# Patient Record
Sex: Female | Born: 1975
Health system: Southern US, Community
[De-identification: ages and names within clinical notes are randomized; demographics above are authoritative.]

## PROBLEM LIST (undated history)

## (undated) DIAGNOSIS — Z9889 Other specified postprocedural states: Secondary | ICD-10-CM

## (undated) DIAGNOSIS — N186 End stage renal disease: Secondary | ICD-10-CM

## (undated) DIAGNOSIS — T4145XA Adverse effect of unspecified anesthetic, initial encounter: Secondary | ICD-10-CM

## (undated) DIAGNOSIS — R112 Nausea with vomiting, unspecified: Secondary | ICD-10-CM

## (undated) DIAGNOSIS — G459 Transient cerebral ischemic attack, unspecified: Secondary | ICD-10-CM

## (undated) DIAGNOSIS — I10 Essential (primary) hypertension: Secondary | ICD-10-CM

## (undated) DIAGNOSIS — T8859XA Other complications of anesthesia, initial encounter: Secondary | ICD-10-CM

## (undated) DIAGNOSIS — Z21 Asymptomatic human immunodeficiency virus [HIV] infection status: Secondary | ICD-10-CM

## (undated) DIAGNOSIS — D649 Anemia, unspecified: Secondary | ICD-10-CM

## (undated) DIAGNOSIS — N189 Chronic kidney disease, unspecified: Secondary | ICD-10-CM

## (undated) DIAGNOSIS — I639 Cerebral infarction, unspecified: Secondary | ICD-10-CM

## (undated) DIAGNOSIS — B20 Human immunodeficiency virus [HIV] disease: Secondary | ICD-10-CM

## (undated) DIAGNOSIS — Z992 Dependence on renal dialysis: Secondary | ICD-10-CM

## (undated) HISTORY — DX: Essential (primary) hypertension: I10

## (undated) HISTORY — DX: Chronic kidney disease, unspecified: N18.9

## (undated) HISTORY — DX: Anemia, unspecified: D64.9

## (undated) HISTORY — DX: End stage renal disease: N18.6

## (undated) HISTORY — DX: Human immunodeficiency virus (HIV) disease: B20

## (undated) HISTORY — DX: Asymptomatic human immunodeficiency virus (hiv) infection status: Z21

## (undated) HISTORY — DX: Cerebral infarction, unspecified: I63.9

---

## 2004-11-17 ENCOUNTER — Ambulatory Visit (HOSPITAL_COMMUNITY): Admission: RE | Admit: 2004-11-17 | Discharge: 2004-11-17 | Payer: Self-pay | Admitting: Family Medicine

## 2005-08-08 ENCOUNTER — Emergency Department (HOSPITAL_COMMUNITY): Admission: EM | Admit: 2005-08-08 | Discharge: 2005-08-08 | Payer: Self-pay | Admitting: Emergency Medicine

## 2005-08-16 ENCOUNTER — Ambulatory Visit: Payer: Self-pay | Admitting: Orthopedic Surgery

## 2005-08-22 ENCOUNTER — Encounter: Admission: RE | Admit: 2005-08-22 | Discharge: 2005-10-31 | Payer: Self-pay | Admitting: Orthopedic Surgery

## 2008-07-28 ENCOUNTER — Ambulatory Visit (HOSPITAL_COMMUNITY): Admission: RE | Admit: 2008-07-28 | Discharge: 2008-07-28 | Payer: Self-pay | Admitting: Family Medicine

## 2011-08-20 ENCOUNTER — Other Ambulatory Visit: Payer: Self-pay | Admitting: Family Medicine

## 2011-08-22 ENCOUNTER — Ambulatory Visit (HOSPITAL_COMMUNITY)
Admission: RE | Admit: 2011-08-22 | Discharge: 2011-08-22 | Disposition: A | Discharge status: Home / Self Care | Payer: BC Managed Care – PPO | Source: Ambulatory Visit | Attending: Family Medicine | Admitting: Family Medicine

## 2011-08-22 DIAGNOSIS — R110 Nausea: Secondary | ICD-10-CM | POA: Insufficient documentation

## 2011-08-22 DIAGNOSIS — R9389 Abnormal findings on diagnostic imaging of other specified body structures: Secondary | ICD-10-CM | POA: Insufficient documentation

## 2011-08-22 DIAGNOSIS — R109 Unspecified abdominal pain: Secondary | ICD-10-CM | POA: Insufficient documentation

## 2011-09-03 ENCOUNTER — Other Ambulatory Visit: Payer: Self-pay | Admitting: Nephrology

## 2011-09-03 ENCOUNTER — Encounter (HOSPITAL_COMMUNITY): Payer: BC Managed Care – PPO | Attending: Nephrology

## 2011-09-03 DIAGNOSIS — N185 Chronic kidney disease, stage 5: Secondary | ICD-10-CM | POA: Insufficient documentation

## 2011-09-03 DIAGNOSIS — D638 Anemia in other chronic diseases classified elsewhere: Secondary | ICD-10-CM | POA: Insufficient documentation

## 2011-09-03 LAB — POCT HEMOGLOBIN-HEMACUE: Hemoglobin: 6.6 g/dL — CL (ref 12.0–15.0)

## 2011-09-04 ENCOUNTER — Other Ambulatory Visit: Payer: Self-pay

## 2011-09-04 DIAGNOSIS — N19 Unspecified kidney failure: Secondary | ICD-10-CM

## 2011-09-04 DIAGNOSIS — Z01810 Encounter for preprocedural cardiovascular examination: Secondary | ICD-10-CM

## 2011-09-05 ENCOUNTER — Other Ambulatory Visit (INDEPENDENT_AMBULATORY_CARE_PROVIDER_SITE_OTHER): Payer: BC Managed Care – PPO | Admitting: *Deleted

## 2011-09-05 ENCOUNTER — Encounter: Payer: Self-pay | Admitting: Vascular Surgery

## 2011-09-05 DIAGNOSIS — Z01810 Encounter for preprocedural cardiovascular examination: Secondary | ICD-10-CM

## 2011-09-05 DIAGNOSIS — N186 End stage renal disease: Secondary | ICD-10-CM

## 2011-09-06 ENCOUNTER — Encounter: Payer: Self-pay | Admitting: Vascular Surgery

## 2011-09-06 ENCOUNTER — Ambulatory Visit (INDEPENDENT_AMBULATORY_CARE_PROVIDER_SITE_OTHER): Payer: BC Managed Care – PPO | Admitting: Vascular Surgery

## 2011-09-06 VITALS — BP 148/98 | HR 72 | Temp 98.1°F | Ht 60.0 in | Wt 160.0 lb

## 2011-09-06 DIAGNOSIS — N186 End stage renal disease: Secondary | ICD-10-CM | POA: Insufficient documentation

## 2011-09-06 DIAGNOSIS — Z21 Asymptomatic human immunodeficiency virus [HIV] infection status: Secondary | ICD-10-CM

## 2011-09-06 DIAGNOSIS — I10 Essential (primary) hypertension: Secondary | ICD-10-CM | POA: Insufficient documentation

## 2011-09-06 DIAGNOSIS — B20 Human immunodeficiency virus [HIV] disease: Secondary | ICD-10-CM | POA: Insufficient documentation

## 2011-09-06 NOTE — Progress Notes (Signed)
[facility] [place] HISTORY AND PHYSICAL   History of Present Illness:  Patient is a 35 y.o. year old female who presents for placement of a permanent hemodialysis access. The patient is right handed .  The patient is not currently on hemodialysis.  The cause of renal failure is thought to be secondary to hypertension. She has no other significant medical problems.  Past Medical History  Diagnosis Date  . Hypertension   . HIV infection   . Chronic kidney disease   . Anemia     Past Surgical History  Procedure Date  . Cesarean section      Social History History  Substance Use Topics  . Smoking status: Former Smoker -- 3 years    Types: Cigarettes    Quit date: 09/05/2009  . Smokeless tobacco: Not on file  . Alcohol Use: No    Family History History reviewed. No pertinent family history.  Allergies  Allergies  Allergen Reactions  . Sulfa Antibiotics Hives     Current Outpatient Prescriptions  Medication Sig Dispense Refill  . calcium carbonate (TUMS EX) 750 MG chewable tablet Chew 1 tablet by mouth 3 (three) times daily.        Marland Kitchen epoetin alfa (EPOGEN,PROCRIT) 16109 UNIT/ML injection Inject 20,000 Units into the skin. Every two weeks       . ferrous gluconate (FERGON) 240 (27 FE) MG tablet Take 240 mg by mouth daily.        Marland Kitchen lisinopril (PRINIVIL,ZESTRIL) 5 MG tablet Take 5 mg by mouth daily.        Marland Kitchen METOPROLOL TARTRATE PO Take 50 mg by mouth 2 (two) times daily.       . Multiple Vitamin (MULTIVITAMIN) tablet Take 1 tablet by mouth daily.        Marland Kitchen triamterene-hydrochlorothiazide (DYAZIDE) 37.5-25 MG per capsule Take 1 capsule by mouth every morning.          ROS:   General:  No weight loss, Fever, chills  HEENT: No recent headaches, no nasal bleeding, no visual changes, no sore throat  Neurologic: No dizziness, blackouts, seizures. No recent symptoms of stroke or mini- stroke. No recent episodes of slurred speech, or temporary  blindness.  Cardiac: No recent episodes of chest pain/pressure, no shortness of breath at rest.  No shortness of breath with exertion.  Denies history of atrial fibrillation or irregular heartbeat  Vascular: No history of rest pain in feet.  No history of claudication.  No history of non-healing ulcer, No history of DVT   Pulmonary: No home oxygen, no productive cough, no hemoptysis,  No asthma or wheezing  Musculoskeletal:  [ ]  Arthritis, [ ]  Low back pain,  [ ]  Joint pain  Hematologic:No history of hypercoagulable state.  No history of easy bleeding.  No history of anemia  Gastrointestinal: No hematochezia or melena,  No gastroesophageal reflux, no trouble swallowing  Urinary: [ ]  chronic Kidney disease, [ ]  on HD - [ ]  MWF or [ ]  TTHS, [ ]  Burning with urination, [ ]  Frequent urination, [ ]  Difficulty urinating;   Skin: No rashes  Psychological: No history of anxiety,  No history of depression   Physical Examination  Filed Vitals:   09/06/11 1202  BP: 148/98  Pulse: 72  Temp: 98.1 F (36.7 C)  TempSrc: Oral  Height: 5' (1.524 m)  Weight: 160 lb (72.576 kg)  SpO2: 100%    Body mass index is 31.25 kg/(m^2).  General:  Alert and  oriented, no acute distress HEENT: Normal Neck: No bruit or JVD Pulmonary: Clear to auscultation bilaterally Cardiac: Regular Rate and Rhythm without murmur Gastrointestinal: Soft, non-tender, non-distended, no mass, no scars Skin: No rash Extremity Pulses:  2+ radial, brachial pulses bilaterally, radial artery feels small Musculoskeletal: No deformity or edema  Neurologic: Upper and lower extremity motor 5/5 and symmetric  DATA: She had a vein mapping ultrasound today which showed the cephalic and basilic veins were quite small bilaterally. These would not be acceptable for fistula.   ASSESSMENT: Patient needs long-term hemodialysis access.   PLAN: She will be scheduled for a left arm AV graft on 09/11/2011. Risks benefits possible  complications and procedure details were explained the patient today including but limited to graft thrombosis rate of 25% per year, risk of ischemic steal, infection, bleeding. She understands and agrees to proceed.  Ruta Hinds, MD [facility] [place] Office: [phone] Pager: [phone]

## 2011-09-10 ENCOUNTER — Encounter (HOSPITAL_COMMUNITY): Payer: BC Managed Care – PPO

## 2011-09-11 ENCOUNTER — Ambulatory Visit (HOSPITAL_COMMUNITY)
Admission: RE | Admit: 2011-09-11 | Discharge: 2011-09-11 | Disposition: A | Discharge status: Home / Self Care | Payer: BC Managed Care – PPO | Source: Ambulatory Visit | Attending: Vascular Surgery | Admitting: Vascular Surgery

## 2011-09-11 ENCOUNTER — Ambulatory Visit (HOSPITAL_COMMUNITY): Payer: BC Managed Care – PPO

## 2011-09-11 DIAGNOSIS — N186 End stage renal disease: Secondary | ICD-10-CM

## 2011-09-11 DIAGNOSIS — Z21 Asymptomatic human immunodeficiency virus [HIV] infection status: Secondary | ICD-10-CM | POA: Insufficient documentation

## 2011-09-11 DIAGNOSIS — I120 Hypertensive chronic kidney disease with stage 5 chronic kidney disease or end stage renal disease: Secondary | ICD-10-CM | POA: Insufficient documentation

## 2011-09-11 HISTORY — PX: ARTERIOVENOUS GRAFT PLACEMENT: SUR1029

## 2011-09-11 LAB — POCT I-STAT 4, (NA,K, GLUC, HGB,HCT)
Hemoglobin: 9.2 g/dL — ABNORMAL LOW (ref 12.0–15.0)
Potassium: 3.3 mEq/L — ABNORMAL LOW (ref 3.5–5.1)

## 2011-09-11 LAB — SURGICAL PCR SCREEN: MRSA, PCR: NEGATIVE

## 2011-09-12 NOTE — Procedures (Unsigned)
CEPHALIC VEIN MAPPING  INDICATION:  Preoperative vein mapping for AVF placement.  HISTORY:  EXAM: The right cephalic vein is compressible.  Diameter measurements range from 0.11 to 0.07 cm.  The right basilic vein is compressible.  Diameter measurements range from 0.22 to 0.21 cm.  The left cephalic vein is too small to be visualized and measured adequately.  The left basilic vein is compressible.  Diameter measurements range from 0.18 to 0.13 cm.  See attached worksheet for all measurements.  IMPRESSION:  Patent right cephalic and basilic veins and left basilic vein with diameter measurements shown on the following worksheet.  ___________________________________________ Jessy Oto. Oneida Alar, MD  EM/MEDQ  D:  09/06/2011  T:  09/06/2011  Job:  [id number]

## 2011-09-14 ENCOUNTER — Encounter (HOSPITAL_COMMUNITY): Payer: BC Managed Care – PPO

## 2011-09-20 NOTE — Op Note (Signed)
  Tiffany Golden, Tiffany Golden NO.:  [id number]  MEDICAL RECORD NO.:  [id number]  LOCATION:  [facility]                         FACILITY:  [facility]  PHYSICIAN:  Jessy Oto. Daron Stutz, MD  DATE OF BIRTH:  [birth year]  DATE OF PROCEDURE:  09/11/2011 DATE OF DISCHARGE:                              OPERATIVE REPORT   PROCEDURE:  Left brachiocephalic arteriovenous fistula.  PREOPERATIVE DIAGNOSIS:  End-stage renal disease.  POSTOPERATIVE DIAGNOSIS:  End-stage renal disease.  ANESTHESIA:  Local with IV sedation.  ASSISTANT:  Leta Baptist, PA  OPERATIVE FINDINGS:  A 3-mm left cephalic vein.  OPERATIVE DETAILS:  After obtaining informed consent, the patient was taken to the operating room.  The patient was placed in supine position on the operating table.  After adequate sedation, the patient's entire left upper extremity was prepped and draped in usual sterile fashion. Next, a transverse incision was made in the left antecubital crease. Incision was carried down through the subcutaneous tissues down the level of the left cephalic vein.  Preoperative vein mapping had suggested that the vein was quite small, however, I was pleasantly surprised to see that the vein was at least 3 mm in diameter.  This was dissected free circumferentially and small side branches were ligated and divided between silk ties.  Next, the brachial artery was dissected free in the medial portion of the incision.  The brachial artery was approximately 3 mm in diameter.  The patient was given 5000 units of intravenous heparin.  The distal cephalic vein was ligated with a 2-0 silk tie.  The vein was transected and swung over the level of the artery.  It was gently distended with heparinized saline and marked for orientation.  It was clamped proximally with a fine bulldog clamp. Vessel loops were used to control the brachial artery proximally and distally.  A longitudinal opening was made in the brachial  artery, and the vein was sewn end-of-vein to side-of-artery using a running 6-0 Prolene suture.  Just prior to completion, anastomosis was fore bled, back bled, and thoroughly flushed.  Anastomosis was secured.  Vessel loops were released.  There was a palpable thrill in the fistula immediately.  Hemostasis was obtained.  The subcutaneous tissues were reapproximated using running 3-0 Vicryl suture.  Skin was closed with a 4-0 Vicryl subcuticular stitch and Dermabond applied.  The patient tolerated the procedure well, and there were no complications.  Instrument, sponge and needle counts were correct at the end of the case.  The patient was taken to the recovery room in stable condition.     Jessy Oto. [name], MD     CEF/MEDQ  D:  09/11/2011  T:  09/11/2011  Job:  [id number]  Electronically Signed by Ruta Hinds MD on 09/20/2011 01:31:04 PM

## 2011-10-10 ENCOUNTER — Ambulatory Visit: Payer: BC Managed Care – PPO

## 2011-10-11 ENCOUNTER — Encounter: Payer: Self-pay | Admitting: Physician Assistant

## 2011-10-12 ENCOUNTER — Ambulatory Visit (INDEPENDENT_AMBULATORY_CARE_PROVIDER_SITE_OTHER): Payer: BC Managed Care – PPO | Admitting: Physician Assistant

## 2011-10-12 ENCOUNTER — Encounter: Payer: Self-pay | Admitting: Physician Assistant

## 2011-10-12 ENCOUNTER — Other Ambulatory Visit (HOSPITAL_COMMUNITY): Payer: Self-pay | Admitting: *Deleted

## 2011-10-12 VITALS — BP 148/105 | HR 89 | Resp 20 | Ht 61.0 in | Wt 139.8 lb

## 2011-10-12 DIAGNOSIS — N186 End stage renal disease: Secondary | ICD-10-CM

## 2011-10-12 NOTE — Progress Notes (Signed)
[facility] [place] Postoperative Visit hemodialysis access   Date of Surgery: 09/11/11 Surgeon: Oneida Alar HD:  no HD:  n/a  CC: f/u for left brachiocephalic AVF placement  HPI:  This is a 35 y.o. female who returns today s/p left brachiocephalic AVF placement.  She denies any symptoms of steal.  She has no complaints.  PHYSICAL EXAMINATION:  Filed Vitals:   10/12/11 1434  BP: 148/105  Pulse: 89  Resp: 20     Incision is c/d/i Hand grip is equal bilaterally and sensation in digits is intact; There is  Thrill; there is bruit. The graft/fistula is easily palpable  Pulse:  +palpable left radial pulse.  ASSESSMENT/PLAN:  Tiffany Golden is a 35 y.o. year old female who presents s/p left brachiocephalic AVF placement on 09/11/11.    She is doing well and is 4 weeks post op.  She has a good thrill. There is no evidence of steal.    I have asked her to return in 6 weeks to check on the maturation of the AVF. Marland Kitchen  Evorn Gong, PA-C [facility] [phone]  Clinic MD:   Bridgett Larsson

## 2011-10-16 ENCOUNTER — Encounter (HOSPITAL_COMMUNITY)
Admission: RE | Admit: 2011-10-16 | Discharge: 2011-10-16 | Disposition: A | Discharge status: Home / Self Care | Payer: BC Managed Care – PPO | Source: Ambulatory Visit | Attending: Nephrology | Admitting: Nephrology

## 2011-10-16 DIAGNOSIS — D638 Anemia in other chronic diseases classified elsewhere: Secondary | ICD-10-CM | POA: Insufficient documentation

## 2011-10-16 DIAGNOSIS — N185 Chronic kidney disease, stage 5: Secondary | ICD-10-CM | POA: Insufficient documentation

## 2011-10-16 LAB — RENAL FUNCTION PANEL
Albumin: 1.2 g/dL — ABNORMAL LOW (ref 3.5–5.2)
BUN: 104 mg/dL — ABNORMAL HIGH (ref 6–23)
CO2: 13 mEq/L — ABNORMAL LOW (ref 19–32)
Creatinine, Ser: 16.46 mg/dL — ABNORMAL HIGH (ref 0.50–1.10)
Glucose, Bld: 106 mg/dL — ABNORMAL HIGH (ref 70–99)
Phosphorus: 9.8 mg/dL — ABNORMAL HIGH (ref 2.3–4.6)
Potassium: 3.3 mEq/L — ABNORMAL LOW (ref 3.5–5.1)
Sodium: 136 mEq/L (ref 135–145)

## 2011-10-16 LAB — FERRITIN: Ferritin: 334 ng/mL — ABNORMAL HIGH (ref 10–291)

## 2011-10-16 LAB — IRON AND TIBC
TIBC: 75 ug/dL — ABNORMAL LOW (ref 250–470)
UIBC: 23 ug/dL — ABNORMAL LOW (ref 125–400)

## 2011-10-16 MED ORDER — EPOETIN ALFA 10000 UNIT/ML IJ SOLN: 20000.0000 [IU] | INTRAMUSCULAR | Status: DC

## 2011-10-16 MED ORDER — EPOETIN ALFA 20000 UNIT/ML IJ SOLN
INTRAMUSCULAR | Status: AC
  Administered 2011-10-16: 20000 [IU] via SUBCUTANEOUS
  Filled 2011-10-16: qty 1

## 2011-10-17 LAB — PTH, INTACT AND CALCIUM: PTH: 432.4 pg/mL — ABNORMAL HIGH (ref 14.0–72.0)

## 2011-10-23 ENCOUNTER — Encounter (HOSPITAL_COMMUNITY): Payer: BC Managed Care – PPO

## 2011-10-30 ENCOUNTER — Encounter (HOSPITAL_COMMUNITY)
Admission: RE | Admit: 2011-10-30 | Discharge: 2011-10-30 | Disposition: A | Discharge status: Home / Self Care | Payer: BC Managed Care – PPO | Source: Ambulatory Visit | Attending: Nephrology | Admitting: Nephrology

## 2011-10-30 LAB — POCT HEMOGLOBIN-HEMACUE: Hemoglobin: 10.5 g/dL — ABNORMAL LOW (ref 12.0–15.0)

## 2011-10-30 MED ORDER — EPOETIN ALFA 10000 UNIT/ML IJ SOLN: 20000.0000 [IU] | INTRAMUSCULAR | Status: DC

## 2011-10-30 MED ORDER — EPOETIN ALFA 20000 UNIT/ML IJ SOLN
INTRAMUSCULAR | Status: AC
  Administered 2011-10-30: 20000 [IU]
  Filled 2011-10-30: qty 1

## 2011-11-14 ENCOUNTER — Inpatient Hospital Stay (HOSPITAL_COMMUNITY): Admission: RE | Admit: 2011-11-14 | Payer: BC Managed Care – PPO | Source: Ambulatory Visit

## 2011-11-21 ENCOUNTER — Encounter (HOSPITAL_COMMUNITY): Payer: BC Managed Care – PPO

## 2011-11-22 ENCOUNTER — Ambulatory Visit: Payer: BC Managed Care – PPO

## 2011-11-28 ENCOUNTER — Encounter (HOSPITAL_COMMUNITY): Payer: BC Managed Care – PPO

## 2011-12-05 ENCOUNTER — Encounter (HOSPITAL_COMMUNITY): Payer: BC Managed Care – PPO

## 2011-12-12 ENCOUNTER — Encounter (HOSPITAL_COMMUNITY): Payer: BC Managed Care – PPO

## 2012-01-22 ENCOUNTER — Telehealth: Payer: Self-pay

## 2012-01-22 NOTE — Telephone Encounter (Signed)
Dr Deterdin's office was contacted and informed multiple phone calls made to schedule appointment for patient.  She is not able to come for intake due to her dialysis schedule.  [facility] since [facility] on Tuesday, Thursday and Saturday. But reconsidered due to the specialty labs that will need to be frozen.  Patient will need to come to our office on one of her non dialysis days.  We will work with her on dates and times as related to the physician's appointment.   I have asked Caren Griffins to speak with the patient while she is there to schedule the visit.  Laverle Patter, RN

## 2013-03-18 ENCOUNTER — Telehealth: Payer: Self-pay

## 2013-03-18 NOTE — Telephone Encounter (Signed)
Caren Griffins from [place] is calling to refer patient.  This patient has been referred in the past and has not presented for appointment.   Per Caren Griffins, the physician at the center thinks her health is failing and she has never been in treatment.   Lab appointment scheduled along with office visit with Dr Tommy Medal. They will notify patient.   No intake scheduled due to patients dialysis schedule and the need for decreased visits.   We will vaccinate the patient at her office visit.   No records of treatment available.    Laverle Patter, RN  Caren Griffins - [phone]

## 2013-03-26 ENCOUNTER — Other Ambulatory Visit: Payer: Self-pay | Admitting: Infectious Disease

## 2013-03-26 ENCOUNTER — Other Ambulatory Visit: Payer: 59

## 2013-03-26 DIAGNOSIS — B20 Human immunodeficiency virus [HIV] disease: Secondary | ICD-10-CM

## 2013-03-26 LAB — COMPLETE METABOLIC PANEL WITH GFR
AST: 47 U/L — ABNORMAL HIGH (ref 0–37)
Alkaline Phosphatase: 118 U/L — ABNORMAL HIGH (ref 39–117)
BUN: 28 mg/dL — ABNORMAL HIGH (ref 6–23)
Creat: 6.74 mg/dL — ABNORMAL HIGH (ref 0.50–1.10)
Potassium: 4.4 mEq/L (ref 3.5–5.3)

## 2013-03-26 LAB — CBC WITH DIFFERENTIAL/PLATELET
Basophils Absolute: 0.1 10*3/uL (ref 0.0–0.1)
Basophils Relative: 1 % (ref 0–1)
Eosinophils Relative: 10 % — ABNORMAL HIGH (ref 0–5)
HCT: 32 % — ABNORMAL LOW (ref 36.0–46.0)
MCHC: 34.4 g/dL (ref 30.0–36.0)
Monocytes Absolute: 0.5 10*3/uL (ref 0.1–1.0)
Neutro Abs: 3.3 10*3/uL (ref 1.7–7.7)
RDW: 17.6 % — ABNORMAL HIGH (ref 11.5–15.5)

## 2013-03-26 LAB — LIPID PANEL
HDL: 31 mg/dL — ABNORMAL LOW (ref >39)
LDL Cholesterol: 64 mg/dL (ref 0–99)
Total CHOL/HDL Ratio: 4.2 Ratio

## 2013-03-27 LAB — T-HELPER CELL (CD4) - (RCID CLINIC ONLY): CD4 T Cell Abs: 60 uL — ABNORMAL LOW (ref 400–2700)

## 2013-03-27 LAB — HEPATITIS B SURFACE ANTIGEN: Hepatitis B Surface Ag: NEGATIVE

## 2013-03-27 LAB — RPR

## 2013-03-27 LAB — HIV ANTIBODY (ROUTINE TESTING W REFLEX): HIV: REACTIVE

## 2013-03-30 LAB — HIV-1 RNA ULTRAQUANT REFLEX TO GENTYP+
HIV 1 RNA Quant: 61345 copies/mL — ABNORMAL HIGH (ref <20)
HIV-1 RNA Quant, Log: 4.79 {Log} — ABNORMAL HIGH (ref <1.30)

## 2013-03-31 ENCOUNTER — Telehealth: Payer: Self-pay | Admitting: Infectious Disease

## 2013-03-31 NOTE — Telephone Encounter (Signed)
Just was double checking that genotype was being run as it appears to be

## 2013-04-04 ENCOUNTER — Emergency Department (HOSPITAL_COMMUNITY): Payer: 59

## 2013-04-04 ENCOUNTER — Inpatient Hospital Stay (HOSPITAL_COMMUNITY)
Admission: EM | Admit: 2013-04-04 | Discharge: 2013-04-08 | DRG: 977 | Disposition: A | Discharge status: Home / Self Care | Payer: 59 | Attending: Internal Medicine | Admitting: Internal Medicine

## 2013-04-04 ENCOUNTER — Encounter (HOSPITAL_COMMUNITY): Payer: Self-pay | Admitting: *Deleted

## 2013-04-04 DIAGNOSIS — B20 Human immunodeficiency virus [HIV] disease: Principal | ICD-10-CM | POA: Diagnosis present

## 2013-04-04 DIAGNOSIS — I10 Essential (primary) hypertension: Secondary | ICD-10-CM | POA: Diagnosis present

## 2013-04-04 DIAGNOSIS — Z992 Dependence on renal dialysis: Secondary | ICD-10-CM

## 2013-04-04 DIAGNOSIS — Z7982 Long term (current) use of aspirin: Secondary | ICD-10-CM

## 2013-04-04 DIAGNOSIS — R29818 Other symptoms and signs involving the nervous system: Secondary | ICD-10-CM | POA: Diagnosis present

## 2013-04-04 DIAGNOSIS — D649 Anemia, unspecified: Secondary | ICD-10-CM | POA: Diagnosis present

## 2013-04-04 DIAGNOSIS — I69928 Other speech and language deficits following unspecified cerebrovascular disease: Secondary | ICD-10-CM

## 2013-04-04 DIAGNOSIS — Z87891 Personal history of nicotine dependence: Secondary | ICD-10-CM

## 2013-04-04 DIAGNOSIS — R29898 Other symptoms and signs involving the musculoskeletal system: Secondary | ICD-10-CM | POA: Diagnosis present

## 2013-04-04 DIAGNOSIS — A0472 Enterocolitis due to Clostridium difficile, not specified as recurrent: Secondary | ICD-10-CM

## 2013-04-04 DIAGNOSIS — N186 End stage renal disease: Secondary | ICD-10-CM

## 2013-04-04 DIAGNOSIS — G459 Transient cerebral ischemic attack, unspecified: Secondary | ICD-10-CM

## 2013-04-04 DIAGNOSIS — I120 Hypertensive chronic kidney disease with stage 5 chronic kidney disease or end stage renal disease: Secondary | ICD-10-CM | POA: Diagnosis present

## 2013-04-04 DIAGNOSIS — R509 Fever, unspecified: Secondary | ICD-10-CM

## 2013-04-04 HISTORY — DX: Dependence on renal dialysis: Z99.2

## 2013-04-04 LAB — ETHANOL: Alcohol, Ethyl (B): <11 mg/dL (ref 0–11)

## 2013-04-04 LAB — DIFFERENTIAL
Basophils Absolute: 0.1 10*3/uL (ref 0.0–0.1)
Eosinophils Relative: 10 % — ABNORMAL HIGH (ref 0–5)
Lymphs Abs: 1.7 10*3/uL (ref 0.7–4.0)
Monocytes Absolute: 0.7 10*3/uL (ref 0.1–1.0)
WBC Morphology: INCREASED

## 2013-04-04 LAB — COMPREHENSIVE METABOLIC PANEL
AST: 43 U/L — ABNORMAL HIGH (ref 0–37)
CO2: 32 mEq/L (ref 19–32)
Calcium: 9 mg/dL (ref 8.4–10.5)
Creatinine, Ser: 5.96 mg/dL — ABNORMAL HIGH (ref 0.50–1.10)
GFR calc Af Amer: 9 mL/min — ABNORMAL LOW (ref >90)
GFR calc non Af Amer: 8 mL/min — ABNORMAL LOW (ref >90)

## 2013-04-04 LAB — POCT I-STAT, CHEM 8
BUN: 22 mg/dL (ref 6–23)
Chloride: 95 mEq/L — ABNORMAL LOW (ref 96–112)
Creatinine, Ser: 5.1 mg/dL — ABNORMAL HIGH (ref 0.50–1.10)
Potassium: 4.2 mEq/L (ref 3.5–5.1)
Sodium: 135 mEq/L (ref 135–145)

## 2013-04-04 LAB — CBC
MCH: 29.4 pg (ref 26.0–34.0)
MCV: 91 fL (ref 78.0–100.0)
Platelets: 173 10*3/uL (ref 150–400)
RDW: 16.7 % — ABNORMAL HIGH (ref 11.5–15.5)

## 2013-04-04 LAB — PROTIME-INR: Prothrombin Time: 12.2 seconds (ref 11.6–15.2)

## 2013-04-04 LAB — POCT I-STAT TROPONIN I

## 2013-04-04 LAB — GLUCOSE, CAPILLARY: Glucose-Capillary: 80 mg/dL (ref 70–99)

## 2013-04-04 LAB — TROPONIN I: Troponin I: <0.3 ng/mL (ref <0.30)

## 2013-04-04 MED ORDER — MIDAZOLAM HCL 5 MG/5ML IJ SOLN
5.0000 mg | Freq: Once | INTRAMUSCULAR | Status: AC
  Administered 2013-04-04: 5 mg via INTRAVENOUS
  Filled 2013-04-04: qty 5

## 2013-04-04 MED ORDER — MORPHINE SULFATE 4 MG/ML IJ SOLN
4.0000 mg | Freq: Once | INTRAMUSCULAR | Status: AC
  Administered 2013-04-04: 4 mg via INTRAVENOUS
  Filled 2013-04-04: qty 1

## 2013-04-04 MED ORDER — ASPIRIN 81 MG PO CHEW
324.0000 mg | CHEWABLE_TABLET | Freq: Once | ORAL | Status: AC
  Administered 2013-04-04: 324 mg via ORAL
  Filled 2013-04-04: qty 4

## 2013-04-04 MED ORDER — ACETAMINOPHEN 500 MG PO TABS
1000.0000 mg | ORAL_TABLET | Freq: Once | ORAL | Status: AC
  Administered 2013-04-04: 1000 mg via ORAL
  Filled 2013-04-04: qty 2

## 2013-04-04 MED ORDER — SODIUM CHLORIDE 0.9 % IV SOLN
INTRAVENOUS | Status: DC
  Administered 2013-04-04: 1000 mL via INTRAVENOUS
  Administered 2013-04-05: 10:00:00 via INTRAVENOUS

## 2013-04-04 NOTE — Progress Notes (Signed)
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 [facility] who agreed to see pt in consult and transfer her here with need for MRI. Patient noted for temp of 101.81F i have requested for an LP given given with acute neurological deficit in an AIDS patient prior to transfer. Once LP is done  hospitalist service will accept the patient to [facility] under telemetry monitoring.

## 2013-04-04 NOTE — ED Provider Notes (Signed)
History  This chart was scribed for Janice Norrie, MD by Jenne Campus, ED Scribe. This patient was seen in room APA02/APA02 and the patient's care was started at 15:21 PM.  CSN: [id number]  Arrival date & time 04/04/13  1504   First MD Initiated Contact with Patient 04/04/13 1521      Chief Complaint  Patient presents with  . Aphasia  . Extremity Weakness     The history is provided by the patient. No language interpreter was used.    HPI Comments: Tiffany Golden is a 37 y.o. female with a h/o HIV and HTN who presents to the Emergency Department complaining of 30 to 45 minutes of sudden onset, now resolved, constant left arm and left leg numbness with associated weakness, dizziness and visual disturbance described as seeing spots. Pt states that she developed the dizziness and visual disturbance around 1 PM (2 hours ago). She went to the grocery store and upon returning home, she developed the numbness and weakness. Mother reports that she then noticed the pt had slurred speech and brought her to the ED for evaluation. Pt states that she currently feels back to baseline and mother states that the pt's speech is back to normal. She denies having any IUDs or being on birth control currently. She reports one prior episode of milder left-sided numbness that started in the left forehead and radiated down her left arm and left leg described as tingling that occured one month ago. The episode lasted at 30 to 40 minutes that started while sitting at the computer and she denies visual disturbance, dizziness and slurred speech with that episode. She is a Mon, Weds, Fri hemodialysis pt with renal failure due to HTN. She reports having a HA yesterday which is normal after her dialysis treatments but denies any other complications. She denies that she does not make any urine. She denies known fever, chills, cough, CP, rhinorrhea, nausea, emesis and diarrhea as associated symptoms. Pt denies smoking and alcohol  use.   Family history: Pt's great grandmother passed away from a CVA around the age of 42s and pt's father has a h/o cardiac disease with a CABG.   PCP is Dr. Sallee Lange Nephrologist is [facility] on [address] is her dialysis clinic [facility]   Past Medical History  Diagnosis Date  . Hypertension   . HIV infection   . Chronic kidney disease   . Anemia   . Dialysis patient     mon, wed friday    Past Surgical History  Procedure Laterality Date  . Cesarean section    . Arteriovenous graft placement  09/11/11    left arm    No family history on file.  History  Substance Use Topics  . Smoking status: Former Smoker -- 3 years    Types: Cigarettes    Quit date: 09/05/2009  . Smokeless tobacco: Not on file  . Alcohol Use: No    No OB history provided. ' Review of Systems  Constitutional: Negative for fever.  Eyes: Positive for visual disturbance.  Respiratory: Negative for cough.   Cardiovascular: Negative for chest pain.  Gastrointestinal: Negative for nausea and vomiting.  Neurological: Positive for dizziness, speech difficulty, weakness and numbness. Negative for headaches.  All other systems reviewed and are negative.    Allergies  Sulfa antibiotics  Home Medications   Current Outpatient Rx  Name  Route  Sig  Dispense  Refill  . calcium carbonate (TUMS  EX) 750 MG chewable tablet   Oral   Chew 2 tablets by mouth 3 (three) times daily.          Marland Kitchen epoetin alfa (EPOGEN,PROCRIT) 60454 UNIT/ML injection   Subcutaneous   Inject 20,000 Units into the skin. Every two weeks          . ferrous gluconate (FERGON) 240 (27 FE) MG tablet   Oral   Take 240 mg by mouth daily.           Marland Kitchen lisinopril (PRINIVIL,ZESTRIL) 5 MG tablet   Oral   Take 5 mg by mouth daily.           Marland Kitchen METOPROLOL TARTRATE PO   Oral   Take 50 mg by mouth 2 (two) times daily.          . Multiple Vitamin (MULTIVITAMIN) tablet   Oral   Take 1 tablet by  mouth daily.           Marland Kitchen triamterene-hydrochlorothiazide (DYAZIDE) 37.5-25 MG per capsule   Oral   Take 1 capsule by mouth every morning.             Triage Vitals: BP 179/101  Pulse 95  Temp(Src) 101.9 F (38.8 C) (Oral)  Resp 22  Ht 5\' 4"  (1.626 m)  Wt 120 lb (54.432 kg)  BMI 20.59 kg/m2  SpO2 100%  LMP 04/04/2013  Vital signs normal except for hypertension and fever   Physical Exam  Nursing note and vitals reviewed. Constitutional: She is oriented to person, place, and time. She appears well-developed and well-nourished.  Non-toxic appearance. She does not appear ill. No distress.  Pt is febrile (101.9)  HENT:  Head: Normocephalic and atraumatic.  Right Ear: External ear normal.  Left Ear: External ear normal.  Nose: Nose normal. No mucosal edema or rhinorrhea.  Mouth/Throat: Oropharynx is clear and moist and mucous membranes are normal. No dental abscesses or edematous.  Eyes: Conjunctivae and EOM are normal. Pupils are equal, round, and reactive to light.  Neck: Normal range of motion and full passive range of motion without pain. Neck supple.  Cardiovascular: Normal rate, regular rhythm and normal heart sounds.  Exam reveals no gallop and no friction rub.   No murmur heard. Pulmonary/Chest: Effort normal and breath sounds normal. No respiratory distress. She has no wheezes. She has no rhonchi. She has no rales. She exhibits no tenderness and no crepitus.  Abdominal: Soft. Normal appearance and bowel sounds are normal. She exhibits no distension. There is no tenderness. There is no rebound and no guarding.  Musculoskeletal: Normal range of motion. She exhibits no edema and no tenderness.  Moves all extremities well.   Neurological: She is alert and oriented to person, place, and time. No cranial nerve deficit.  Left-handed grip is weaker than the grip on the right, more so than expected for a right-handed person. Mild pronator's drift on the left. LLE weakness to  holding leg against gravity and flexion. Heel to shin rub on the left is difficult and uncoordinated. Finger to nose is intact.  Skin: Skin is warm, dry and intact. No rash noted. No erythema. No pallor.  Psychiatric: She has a normal mood and affect. Her speech is normal and behavior is normal. Her mood appears not anxious.    ED Course  Procedures (including critical care time)  Medications  acetaminophen (TYLENOL) tablet 1,000 mg (1,000 mg Oral Given 04/04/13 1705)  aspirin chewable tablet 324 mg (324 mg Oral Given 04/04/13  1705)  midazolam (VERSED) 5 MG/5ML injection 5 mg (5 mg Intravenous Given 04/04/13 1811)  morphine 4 MG/ML injection 4 mg (4 mg Intravenous Given 04/04/13 1810)    DIAGNOSTIC STUDIES: Oxygen Saturation is 100% on room air, normal by my interpretation.    COORDINATION OF CARE: 15:36 PM-Informed pt that her symptoms are probable for a TIA. Discussed treatment plan which includes CT of head, consult with neurology, transfer to [facility] for a MRI, CRX, CBC panel, CMP and UA with pt at bedside and pt agreed to plan.   15:40 PM-Code Stroke called.  16:06 Dr Maryland Pink, radiology called head CT back as normal  16:19 Dr Zenia Resides, teleneurology, discussed patient and he will do consult. Does not want her to have an LP at this time.   16:42 Dr Armida Sans will consult on patient, wants hospitalists to admit  17:03 Dr Clementeen Graham won't accept in transfer until she has an LP  5:35 PM-Informed pt of lab and radiology results. Re-discussed terms or transfer to [facility] and LP. Addressed pt's concerns. Had lengthy discussed as to what the pt should expect upon transfer.   Pt given IV morphine and versed before procedure, she was placed on pulse ox and CO2 monitors.   LUMBAR PUNCTURE PROCEDURE NOTE Patient identification was confirmed and consent was obtained.  The procedure was performed at 6:15 PM by Janice Norrie, MD. Indication: L4-5 interspace Puncture Site: L4-5 interspace Sterile procedures  observed Patient position:  Left Lateral Decubitus Needle size: 20 gauge x 2 Anesthetic used (type and amt): 3 ccs of 1% lidocaine  Amount CSF collected: none Color of CSF collected: none Site anesthetized, puncture made at indicated site, 3 unsuccessful attempts made. 2 blood returns.  Pt tolerated procedure well without complications. Advised pt that she will have a LP performed by radiology upon transfer to [facility].   Results for orders placed during the hospital encounter of 04/04/13  CULTURE, BLOOD (ROUTINE X 2)      Result Value Range   Specimen Description RIGHT ANTECUBITAL     Special Requests BOTTLES DRAWN AEROBIC AND ANAEROBIC 6CC     Culture PENDING     Report Status PENDING    CULTURE, BLOOD (ROUTINE X 2)      Result Value Range   Specimen Description BLOOD RIGHT HAND     Special Requests BOTTLES DRAWN AEROBIC AND ANAEROBIC 6CC     Culture PENDING     Report Status PENDING    ETHANOL      Result Value Range   Alcohol, Ethyl (B) <11  0 - 11 mg/dL  PROTIME-INR      Result Value Range   Prothrombin Time 12.2  11.6 - 15.2 seconds   INR 0.91  0.00 - 1.49  APTT      Result Value Range   aPTT 37  24 - 37 seconds  CBC      Result Value Range   WBC 9.8  4.0 - 10.5 K/uL   RBC 2.99 (*) 3.87 - 5.11 MIL/uL   Hemoglobin 8.8 (*) 12.0 - 15.0 g/dL   HCT 27.2 (*) 36.0 - 46.0 %   MCV 91.0  78.0 - 100.0 fL   MCH 29.4  26.0 - 34.0 pg   MCHC 32.4  30.0 - 36.0 g/dL   RDW 16.7 (*) 11.5 - 15.5 %   Platelets 173  150 - 400 K/uL  DIFFERENTIAL      Result Value Range   Neutrophils Relative % 65  43 -  77 %   Lymphocytes Relative 17  12 - 46 %   Monocytes Relative 7  3 - 12 %   Eosinophils Relative 10 (*) 0 - 5 %   Basophils Relative 1  0 - 1 %   Neutro Abs 6.3  1.7 - 7.7 K/uL   Lymphs Abs 1.7  0.7 - 4.0 K/uL   Monocytes Absolute 0.7  0.1 - 1.0 K/uL   Eosinophils Absolute 1.0 (*) 0.0 - 0.7 K/uL   Basophils Absolute 0.1  0.0 - 0.1 K/uL   WBC Morphology INCREASED BANDS (>20% BANDS)     COMPREHENSIVE METABOLIC PANEL      Result Value Range   Sodium 133 (*) 135 - 145 mEq/L   Potassium 4.1  3.5 - 5.1 mEq/L   Chloride 91 (*) 96 - 112 mEq/L   CO2 32  19 - 32 mEq/L   Glucose, Bld 89  70 - 99 mg/dL   BUN 21  6 - 23 mg/dL   Creatinine, Ser 5.96 (*) 0.50 - 1.10 mg/dL   Calcium 9.0  8.4 - 10.5 mg/dL   Total Protein 7.7  6.0 - 8.3 g/dL   Albumin 3.0 (*) 3.5 - 5.2 g/dL   AST 43 (*) 0 - 37 U/L   ALT 19  0 - 35 U/L   Alkaline Phosphatase 121 (*) 39 - 117 U/L   Total Bilirubin 0.4  0.3 - 1.2 mg/dL   GFR calc non Af Amer 8 (*) >90 mL/min   GFR calc Af Amer 9 (*) >90 mL/min  TROPONIN I      Result Value Range   Troponin I <0.30  <0.30 ng/mL  GLUCOSE, CAPILLARY      Result Value Range   Glucose-Capillary 80  70 - 99 mg/dL  POCT I-STAT, CHEM 8      Result Value Range   Sodium 135  135 - 145 mEq/L   Potassium 4.2  3.5 - 5.1 mEq/L   Chloride 95 (*) 96 - 112 mEq/L   BUN 22  6 - 23 mg/dL   Creatinine, Ser 5.10 (*) 0.50 - 1.10 mg/dL   Glucose, Bld 85  70 - 99 mg/dL   Calcium, Ion 1.12  1.12 - 1.23 mmol/L   TCO2 32  0 - 100 mmol/L   Hemoglobin 9.2 (*) 12.0 - 15.0 g/dL   HCT 27.0 (*) 36.0 - 46.0 %  POCT I-STAT TROPONIN I      Result Value Range   Troponin i, poc 0.00  0.00 - 0.08 ng/mL   Comment 3            Laboratory interpretation all normal except know CRF, anemia, normal total WBC with increased bands    Dg Chest 1 View  04/04/2013   *RADIOLOGY REPORT*  Clinical Data: Has aphasia.  Extremity weakness.  CHEST - 1 VIEW  Comparison: 09/11/2011  Findings: The heart is significantly enlarged, and marked change since prior study.  This raises a question of pericardial effusion or dilated cardiomyopathy.  There are no focal consolidations or pleural effusions.  No pulmonary edema.  IMPRESSION: Significant change in the appearance of the heart, raising question of new pericardial effusion or dilated cardiomyopathy.  Further evaluation with echocardiogram may be helpful.    Original Report Authenticated By: Nolon Nations, M.D.   Ct Head Wo Contrast  04/04/2013   *RADIOLOGY REPORT*  Clinical Data: Slurred speech, weakness, aphasia  CT HEAD WITHOUT CONTRAST  Technique:  Contiguous axial images were obtained  from the base of the skull through the vertex without contrast.  Comparison: None.  Findings: No evidence of parenchymal hemorrhage or extra-axial fluid collection. No mass lesion, mass effect, or midline shift.  No CT evidence of acute infarction.  Cerebral volume is age appropriate.  No ventriculomegaly.  The visualized paranasal sinuses are essentially clear. The mastoid air cells are unopacified.  No evidence of calvarial fracture.  IMPRESSION: No evidence of acute intracranial abnormality.  These results were called by telephone on 04/04/2013 at 1605 hours to Dr Rolland Porter, who verbally acknowledged these results.   Original Report Authenticated By: Julian Hy, M.D.     Date: 04/04/2013  Rate: 99  Rhythm: normal sinus rhythm  QRS Axis: normal  Intervals: normal  ST/T Wave abnormalities: normal  Conduction Disutrbances:none  Narrative Interpretation: PRWP  Old EKG Reviewed: unchanged from 09/11/2011 HR was 75     1. TIA (transient ischemic attack)   2. ESRD on hemodialysis   3. Fever    Plan transfer to [facility] for admission   Rolland Porter, MD, FACEP  CRITICAL CARE Performed by: Rolland Porter L Total critical care time: 31 min Critical care time was exclusive of separately billable procedures and treating other patients. Critical care was necessary to treat or prevent imminent or life-threatening deterioration. Critical care was time spent personally by me on the following activities: development of treatment plan with patient and/or surrogate as well as nursing, discussions with consultants, evaluation of patient's response to treatment, examination of patient, obtaining history from patient or surrogate, ordering and performing treatments and  interventions, ordering and review of laboratory studies, ordering and review of radiographic studies, pulse oximetry and re-evaluation of patient's condition.   MDM   I personally performed the services described in this documentation, which was scribed in my presence. The recorded information has been reviewed and considered.  Rolland Porter, MD, Abr[facility] Sander        Janice Norrie, MD 04/04/13 1910

## 2013-04-04 NOTE — ED Notes (Signed)
Patient has sudden onset numbness of L leg and "heaviness" in L arm today.  This has happened once before but numbness included L side of face.  Very slight weakness noted on L extremities.  Speech is clear, a/o x 3.

## 2013-04-04 NOTE — Consult Note (Signed)
Referring Physician: Dhungel    Chief Complaint: Left sided weakness  HPI: Tiffany Golden is an 37 y.o. female who reports that she was shopping with her mother today and began to experience some dizziness.  Then noted that she had some blurriness of vision.  She went to sit in the car and there noted that her left peripheral vision was poor.  She developed a pressure-like headache as well.  When she got home she felt a heaviness in her left leg and almost fell due to weakness in it.  When in the house noted her left arm was heavy as well and then noted her speech to be slurred.  It was decided that she would go to the ED at that time but by the time of arrival her symptoms had cleared.  They lasted about 3 hours.  She now feels back to baseline.  While in the ED she was noted to be febrile as well.  LP was attempted but was not successful.  Despite not being started on antibiotics she has remained afebrile.    Date last known well: 04/04/2013 Time last known well: 3 tPA Given: No: Resolution of symptoms  Past Medical History  Diagnosis Date  . Hypertension   . HIV infection   . Chronic kidney disease   . Anemia   . Dialysis patient     mon, wed friday    Past Surgical History  Procedure Laterality Date  . Cesarean section    . Arteriovenous graft placement  09/11/11    left arm    Family history: Mother with hyperlipidemia.  Father with CAD s/p CABG  Social History:  reports that she quit smoking about 3 years ago. Her smoking use included Cigarettes. She smoked 0.00 packs per day for 3 years. She does not have any smokeless tobacco history on file. She reports that she does not drink alcohol or use illicit drugs.  Allergies:  Allergies  Allergen Reactions  . Sulfa Antibiotics Hives    Medications:  I have reviewed the patient's current medications. Prior to Admission:  Prescriptions prior to admission  Medication Sig Dispense Refill  . b complex-vitamin c-folic acid  (NEPHRO-VITE) 0.8 MG TABS Take 0.8 mg by mouth at bedtime.      . metoprolol tartrate (LOPRESSOR) 25 MG tablet Take 25 mg by mouth 2 (two) times daily. Patient does not take night dose on Tues.,Thurs.,Sat.      Marland Kitchen epoetin alfa (EPOGEN,PROCRIT) 60454 UNIT/ML injection Inject 20,000 Units into the skin. Every two weeks        Scheduled:   ROS: History obtained from the patient  General ROS: negative for - chills, fatigue, fever, night sweats, weight gain or weight loss Psychological ROS: negative for - behavioral disorder, hallucinations, memory difficulties, mood swings or suicidal ideation Ophthalmic ROS: as noted in HPI ENT ROS: negative for - epistaxis, nasal discharge, oral lesions, sore throat, tinnitus or vertigo Allergy and Immunology ROS: negative for - hives or itchy/watery eyes Hematological and Lymphatic ROS: negative for - bleeding problems, bruising or swollen lymph nodes Endocrine ROS: negative for - galactorrhea, hair pattern changes, polydipsia/polyuria or temperature intolerance Respiratory ROS: negative for - cough, hemoptysis, shortness of breath or wheezing Cardiovascular ROS: negative for - chest pain, dyspnea on exertion, edema or irregular heartbeat Gastrointestinal ROS: negative for - abdominal pain, diarrhea, hematemesis, nausea/vomiting or stool incontinence Genito-Urinary ROS: negative for - dysuria, hematuria, incontinence or urinary frequency/urgency Musculoskeletal ROS: negative for - joint swelling or  muscular weakness Neurological ROS: as noted in HPI Dermatological ROS: negative for rash and skin lesion changes  Physical Examination: Blood pressure 160/98, pulse 86, temperature 98.1 F (36.7 C), temperature source Oral, resp. rate 20, height 5\' 4"  (1.626 m), weight 54.432 kg (120 lb), last menstrual period 04/04/2013, SpO2 96.00%.  Neurologic Examination: Mental Status: Alert, oriented, thought content appropriate.  Speech fluent without evidence of  aphasia.  Able to follow 3 step commands without difficulty. Cranial Nerves: II: Discs flat bilaterally; Visual fields grossly normal, pupils equal, round, reactive to light and accommodation III,IV, VI: ptosis not present, extra-ocular motions intact bilaterally V,VII: smile symmetric, facial light touch sensation normal bilaterally VIII: hearing normal bilaterally IX,X: gag reflex present XI: bilateral shoulder shrug XII: midline tongue extension Motor: Right : Upper extremity   5/5    Left:     Upper extremity   5/5  Lower extremity   5/5     Lower extremity   5/5 Tone and bulk:normal tone throughout; no atrophy noted Sensory: Pinprick and light touch intact throughout, bilaterally Deep Tendon Reflexes: 2+ and symmetric throughout Plantars: Right: downgoing   Left: downgoing Cerebellar: Normal finger-to-nose. Mild dysmetria with heel-to-shin testing of both lower extremities Gait: normal gait and station CV: pulses palpable throughout   Laboratory Studies:  Basic Metabolic Panel:  Recent Labs Lab 04/04/13 1539 04/04/13 1551  NA 133* 135  K 4.1 4.2  CL 91* 95*  CO2 32  --   GLUCOSE 89 85  BUN 21 22  CREATININE 5.96* 5.10*  CALCIUM 9.0  --     Liver Function Tests:  Recent Labs Lab 04/04/13 1539  AST 43*  ALT 19  ALKPHOS 121*  BILITOT 0.4  PROT 7.7  ALBUMIN 3.0*   No results found for this basename: LIPASE, AMYLASE,  in the last 168 hours No results found for this basename: AMMONIA,  in the last 168 hours  CBC:  Recent Labs Lab 04/04/13 1539 04/04/13 1551  WBC 9.8  --   NEUTROABS 6.3  --   HGB 8.8* 9.2*  HCT 27.2* 27.0*  MCV 91.0  --   PLT 173  --     Cardiac Enzymes:  Recent Labs Lab 04/04/13 1539  TROPONINI <0.30    BNP: No components found with this basename: POCBNP,   CBG:  Recent Labs Lab 04/04/13 1611  GLUCAP 80    Microbiology: Results for orders placed during the hospital encounter of 04/04/13  CULTURE, BLOOD (ROUTINE X  2)     Status: None   Collection Time    04/04/13  3:43 PM      Result Value Range Status   Specimen Description RIGHT ANTECUBITAL   Final   Special Requests BOTTLES DRAWN AEROBIC AND ANAEROBIC 6CC   Final   Culture PENDING   Incomplete   Report Status PENDING   Incomplete  CULTURE, BLOOD (ROUTINE X 2)     Status: None   Collection Time    04/04/13  3:48 PM      Result Value Range Status   Specimen Description BLOOD RIGHT HAND   Final   Special Requests BOTTLES DRAWN AEROBIC AND ANAEROBIC 6CC   Final   Culture PENDING   Incomplete   Report Status PENDING   Incomplete    Coagulation Studies:  Recent Labs  04/04/13 1539  LABPROT 12.2  INR 0.91    Urinalysis: No results found for this basename: COLORURINE, APPERANCEUR, LABSPEC, PHURINE, GLUCOSEU, HGBUR, BILIRUBINUR, KETONESUR, PROTEINUR, UROBILINOGEN, NITRITE,  LEUKOCYTESUR,  in the last 168 hours  Lipid Panel:    Component Value Date/Time   CHOL 130 03/26/2013 1426   TRIG 176* 03/26/2013 1426   HDL 31* 03/26/2013 1426   CHOLHDL 4.2 03/26/2013 1426   VLDL 35 03/26/2013 1426   LDLCALC 64 03/26/2013 1426    HgbA1C:  No results found for this basename: HGBA1C    Urine Drug Screen:   No results found for this basename: labopia, cocainscrnur, labbenz, amphetmu, thcu, labbarb    Alcohol Level:  Recent Labs Lab 04/04/13 1539  ETH <11    Imaging: Dg Chest 1 View  04/04/2013   *RADIOLOGY REPORT*  Clinical Data: Has aphasia.  Extremity weakness.  CHEST - 1 VIEW  Comparison: 09/11/2011  Findings: The heart is significantly enlarged, and marked change since prior study.  This raises a question of pericardial effusion or dilated cardiomyopathy.  There are no focal consolidations or pleural effusions.  No pulmonary edema.  IMPRESSION: Significant change in the appearance of the heart, raising question of new pericardial effusion or dilated cardiomyopathy.  Further evaluation with echocardiogram may be helpful.   Original Report  Authenticated By: Nolon Nations, M.D.   Ct Head Wo Contrast  04/04/2013   *RADIOLOGY REPORT*  Clinical Data: Slurred speech, weakness, aphasia  CT HEAD WITHOUT CONTRAST  Technique:  Contiguous axial images were obtained from the base of the skull through the vertex without contrast.  Comparison: None.  Findings: No evidence of parenchymal hemorrhage or extra-axial fluid collection. No mass lesion, mass effect, or midline shift.  No CT evidence of acute infarction.  Cerebral volume is age appropriate.  No ventriculomegaly.  The visualized paranasal sinuses are essentially clear. The mastoid air cells are unopacified.  No evidence of calvarial fracture.  IMPRESSION: No evidence of acute intracranial abnormality.  These results were called by telephone on 04/04/2013 at 1605 hours to Dr Rolland Porter, who verbally acknowledged these results.   Original Report Authenticated By: Julian Hy, M.D.    Assessment: 37 y.o. HIV positive female presenting with an episode of a left visual field cut, left hemiparesis and slurred speech.  Patient also had a fever as well.  All symptoms have resolved.  Patient now at baseline despite no intervention.  LP attempted but unsuccessful.  Head CT reviewed and shows no acute abnormalities.    Stroke Risk Factors - hypertension  Plan: 1. MRI, MRA  of the brain without contrast 2. Patient no longer febrile.  Would re-evaluate in the morning for necessity of LP 3. Echocardiogram 4. Prophylactic therapy-Antiplatelet med: Aspirin  5. Telemetry monitoring 6. Frequent neuro checks 7. EEG  Alexis Goodell, MD [facility] [phone]  04/04/2013, 11:55 PM

## 2013-04-04 NOTE — ED Notes (Signed)
Teleneuro consult in progress 

## 2013-04-04 NOTE — ED Notes (Signed)
Pt presents to er with c/o left side weakness affecting her left arm and leg, slurred speech, pt states that she was in the grocery store when the symptoms started around 1pm today.

## 2013-04-05 ENCOUNTER — Encounter (HOSPITAL_COMMUNITY): Payer: Self-pay | Admitting: Nephrology

## 2013-04-05 ENCOUNTER — Inpatient Hospital Stay (HOSPITAL_COMMUNITY): Payer: 59

## 2013-04-05 DIAGNOSIS — G459 Transient cerebral ischemic attack, unspecified: Secondary | ICD-10-CM

## 2013-04-05 DIAGNOSIS — N186 End stage renal disease: Secondary | ICD-10-CM

## 2013-04-05 DIAGNOSIS — B20 Human immunodeficiency virus [HIV] disease: Principal | ICD-10-CM | POA: Diagnosis present

## 2013-04-05 DIAGNOSIS — R509 Fever, unspecified: Secondary | ICD-10-CM

## 2013-04-05 DIAGNOSIS — A0472 Enterocolitis due to Clostridium difficile, not specified as recurrent: Secondary | ICD-10-CM

## 2013-04-05 MED ORDER — HEPARIN SODIUM (PORCINE) 5000 UNIT/ML IJ SOLN
5000.0000 [IU] | Freq: Three times a day (TID) | INTRAMUSCULAR | Status: DC
  Administered 2013-04-05 – 2013-04-08 (×11): 5000 [IU] via SUBCUTANEOUS
  Filled 2013-04-05 (×13): qty 1

## 2013-04-05 MED ORDER — METOPROLOL TARTRATE 50 MG PO TABS
50.0000 mg | ORAL_TABLET | Freq: Two times a day (BID) | ORAL | Status: DC
  Administered 2013-04-05 – 2013-04-07 (×3): 50 mg via ORAL
  Filled 2013-04-05 (×5): qty 1

## 2013-04-05 MED ORDER — LAMIVUDINE 10 MG/ML PO SOLN
25.0000 mg | Freq: Every day | ORAL | Status: DC
  Administered 2013-04-06 – 2013-04-08 (×3): 25 mg via ORAL
  Filled 2013-04-05 (×3): qty 5

## 2013-04-05 MED ORDER — DARBEPOETIN ALFA-POLYSORBATE 60 MCG/0.3ML IJ SOLN
60.0000 ug | INTRAMUSCULAR | Status: DC
  Administered 2013-04-06: 60 ug via INTRAVENOUS
  Filled 2013-04-05: qty 0.3

## 2013-04-05 MED ORDER — LAMIVUDINE 10 MG/ML PO SOLN
50.0000 mg | Freq: Once | ORAL | Status: AC
  Administered 2013-04-05: 50 mg via ORAL
  Filled 2013-04-05: qty 5

## 2013-04-05 MED ORDER — CALCIUM CARBONATE ANTACID 500 MG PO CHEW
1.0000 | CHEWABLE_TABLET | Freq: Three times a day (TID) | ORAL | Status: DC
  Administered 2013-04-05 – 2013-04-08 (×8): 200 mg via ORAL
  Filled 2013-04-05 (×12): qty 1

## 2013-04-05 MED ORDER — RENA-VITE PO TABS
1.0000 | ORAL_TABLET | Freq: Every day | ORAL | Status: DC
  Administered 2013-04-05 – 2013-04-07 (×3): 1 via ORAL
  Filled 2013-04-05 (×4): qty 1

## 2013-04-05 MED ORDER — AZITHROMYCIN 600 MG PO TABS
1200.0000 mg | ORAL_TABLET | ORAL | Status: DC
  Administered 2013-04-06: 1200 mg via ORAL
  Filled 2013-04-05: qty 2

## 2013-04-05 MED ORDER — DOLUTEGRAVIR SODIUM 50 MG PO TABS
50.0000 mg | ORAL_TABLET | Freq: Two times a day (BID) | ORAL | Status: DC
  Administered 2013-04-05 – 2013-04-08 (×6): 50 mg via ORAL
  Filled 2013-04-05 (×7): qty 1

## 2013-04-05 MED ORDER — TENOFOVIR DISOPROXIL FUMARATE 300 MG PO TABS
300.0000 mg | ORAL_TABLET | ORAL | Status: DC
  Administered 2013-04-05: 300 mg via ORAL
  Filled 2013-04-05: qty 1

## 2013-04-05 MED ORDER — ASPIRIN 325 MG PO TABS
325.0000 mg | ORAL_TABLET | Freq: Every day | ORAL | Status: DC
  Administered 2013-04-05 – 2013-04-08 (×4): 325 mg via ORAL
  Filled 2013-04-05 (×4): qty 1

## 2013-04-05 MED ORDER — DAPSONE 100 MG PO TABS
100.0000 mg | ORAL_TABLET | Freq: Every day | ORAL | Status: DC
  Administered 2013-04-05 – 2013-04-08 (×4): 100 mg via ORAL
  Filled 2013-04-05 (×4): qty 1

## 2013-04-05 MED ORDER — HYDRALAZINE HCL 25 MG PO TABS
25.0000 mg | ORAL_TABLET | Freq: Three times a day (TID) | ORAL | Status: DC
  Administered 2013-04-05 – 2013-04-08 (×10): 25 mg via ORAL
  Filled 2013-04-05 (×13): qty 1

## 2013-04-05 NOTE — Progress Notes (Signed)
  Echocardiogram 2D Echocardiogram has been performed.  Tiffany Golden 04/05/2013, 5:48 PM

## 2013-04-05 NOTE — Consult Note (Signed)
[facility] Renal Consultation Note    Indication for Consultation:  Management of ESRD/hemodialysis; anemia, hypertension/volume and secondary hyperparathyroidism  HPI: Tiffany Golden is a 37 y.o. female with ESRD on MWF dialysis at [facility], HTN, HIV who presented to [facility] 5/24 with acute onset of blurred vision followed by numbness/weakness in her left leg and arm for about 2 hours that started while grocery shopping with mother while visiting her in [place].. She also had transient associated loss of peripheral vision and slurred speech and her mother subsequently took her to [facility] for evaluation and then she was transferred to [facility] for further evaluation and treatment.  Temp in ED was 101.9. Head CT and MRI/A showed no acute findings. LP unable to be done; cCemistries were unremarkable, Hgb was 8,.8 with repeat 9.2 WBC 9.8 with 65% neutrophils, 17% lymps, 10 % eos, 7, monos with increased bands. She also had previous labs drawn per ID 5/15 which were showed Hep A Ab+ Hep B Ab+ Ag neg, Hep C AB neg and HIV + with pending genotype.   Her dialysis treatments have been unremarkable except for troubles getting to EDW.  She feels bad towards the end of treatments even with her EDW being increase 0.5 kg.  She has had some inconsistency in taking BP meds in the past, and most recently had been taking 50 metoprolol bid.  Past Medical History  Diagnosis Date  . Hypertension   . HIV infection   . Chronic kidney disease   . Anemia   . Dialysis patient     mon, wed friday   Past Surgical History  Procedure Laterality Date  . Cesarean section    . Arteriovenous graft placement  09/11/11    left arm   Family History  Problem Relation Age of Onset  . Hyperlipidemia Mother   . Heart disease Father     Hx CABG  . Cancer - Other Mother     Hx partial hysterectomy   Social History: Has 16 hear old son.  reports that she quit smoking about 3 years ago. Her smoking  use included Cigarettes. She smoked 0.00 packs per day for 3 years. She does not have any smokeless tobacco history on file. She reports that she does not drink alcohol or use illicit drugs. Allergies  Allergen Reactions  . Sulfa Antibiotics Hives   Prior to Admission medications   Medication Sig Start Date End Date Taking? Authorizing Provider  b complex-vitamin c-folic acid (NEPHRO-VITE) 0.8 MG TABS Take 0.8 mg by mouth at bedtime.   Yes Historical Provider, MD  metoprolol tartrate (LOPRESSOR) 25 MG tablet Take 50 mg by mouth 2 (two) times daily. Patient does not take night dose on Tues.,Thurs.,Sat.   Yes Historical Provider, MD  epoetin alfa (EPOGEN,PROCRIT) 62130 UNIT/ML injection Inject 20,000 Units into the skin. Every two weeks     Historical Provider, MD   Current Facility-Administered Medications  Medication Dose Route Frequency Provider Last Rate Last Dose  . aspirin tablet 325 mg  325 mg Oral Daily Nishant Dhungel, MD      . Derrill Memo ON 04/06/2013] azithromycin (ZITHROMAX) tablet 1,200 mg  1,200 mg Oral Weekly Truman Hayward, MD      . calcium carbonate (TUMS - dosed in mg elemental calcium) chewable tablet 200 mg of elemental calcium  1 tablet Oral TID WC Myriam Jacobson, PA-C      . dapsone tablet 100 mg  100 mg Oral Daily  Truman Hayward, MD      . Derrill Memo ON 04/06/2013] darbepoetin (ARANESP) injection 60 mcg  60 mcg Intravenous Q Mon-HD Myriam Jacobson, PA-C      . Dolutegravir Sodium TABS 50 mg  50 mg Oral BID Truman Hayward, MD      . heparin injection 5,000 Units  5,000 Units Subcutaneous Q8H Louellen Molder, MD   5,000 Units at 04/05/13 0941  . hydrALAZINE (APRESOLINE) tablet 25 mg  25 mg Oral Q8H Nishant Dhungel, MD   25 mg at 04/05/13 0945  . [START ON 04/06/2013] lamiVUDine (EPIVIR) 10 MG/ML solution 25 mg  25 mg Oral Daily Truman Hayward, MD      . lamiVUDine (EPIVIR) 10 MG/ML solution 50 mg  50 mg Oral Once Truman Hayward, MD      . multivitamin  (RENA-VIT) tablet 1 tablet  1 tablet Oral Q supper Myriam Jacobson, PA-C      . tenofovir Veva Holes) tablet 300 mg  300 mg Oral Weekly Truman Hayward, MD       Labs: Basic Metabolic Panel:  Recent Labs Lab 04/04/13 1539 04/04/13 1551  NA 133* 135  K 4.1 4.2  CL 91* 95*  CO2 32  --   GLUCOSE 89 85  BUN 21 22  CREATININE 5.96* 5.10*  CALCIUM 9.0  --    Liver Function Tests:  Recent Labs Lab 04/04/13 1539  AST 43*  ALT 19  ALKPHOS 121*  BILITOT 0.4  PROT 7.7  ALBUMIN 3.0*  CBC:  Recent Labs Lab 04/04/13 1539 04/04/13 1551  WBC 9.8  --   NEUTROABS 6.3  --   HGB 8.8* 9.2*  HCT 27.2* 27.0*  MCV 91.0  --   PLT 173  --    Cardiac Enzymes:  Recent Labs Lab 04/04/13 1539  TROPONINI <0.30   CBG:  Recent Labs Lab 04/04/13 1611  GLUCAP 80   Studies/Results: Dg Chest 1 View  04/04/2013   *RADIOLOGY REPORT*  Clinical Data: Has aphasia.  Extremity weakness.  CHEST - 1 VIEW  Comparison: 09/11/2011  Findings: The heart is significantly enlarged, and marked change since prior study.  This raises a question of pericardial effusion or dilated cardiomyopathy.  There are no focal consolidations or pleural effusions.  No pulmonary edema.  IMPRESSION: Significant change in the appearance of the heart, raising question of new pericardial effusion or dilated cardiomyopathy.  Further evaluation with echocardiogram may be helpful.   Original Report Authenticated By: Nolon Nations, M.D.   Ct Head Wo Contrast  04/04/2013   *RADIOLOGY REPORT*  Clinical Data: Slurred speech, weakness, aphasia  CT HEAD WITHOUT CONTRAST  Technique:  Contiguous axial images were obtained from the base of the skull through the vertex without contrast.  Comparison: None.  Findings: No evidence of parenchymal hemorrhage or extra-axial fluid collection. No mass lesion, mass effect, or midline shift.  No CT evidence of acute infarction.  Cerebral volume is age appropriate.  No ventriculomegaly.  The  visualized paranasal sinuses are essentially clear. The mastoid air cells are unopacified.  No evidence of calvarial fracture.  IMPRESSION: No evidence of acute intracranial abnormality.  These results were called by telephone on 04/04/2013 at 1605 hours to Dr Rolland Porter, who verbally acknowledged these results.   Original Report Authenticated By: Julian Hy, M.D.   Mr Brain Wo Contrast  04/05/2013   *RADIOLOGY REPORT*  Clinical Data:  Stroke.  HIV.  Hypertension.  End-stage  renal disease.  MRI HEAD WITHOUT CONTRAST MRA HEAD WITHOUT CONTRAST  Technique:  Multiplanar, multiecho pulse sequences of the brain and surrounding structures were obtained without intravenous contrast. Angiographic images of the head were obtained using MRA technique without contrast.  Comparison:  Head CT 05/24  MRI HEAD  Findings:  Diffusion imaging does not show any acute or subacute infarction.  There are mild chronic small vessel changes of the pons.  No cerebellar abnormality.  The cerebral hemispheres show scattered punctate foci of T2 and FLAIR signal within the white matter consistent with chronic small vessel disease.  No cortical or large vessel territory infarction.  No mass lesion, hemorrhage, hydrocephalus or extra-axial collection.  No pituitary mass.  No inflammatory sinus disease.  IMPRESSION: No acute finding.  Mild chronic small vessel changes throughout the brain.  MRA HEAD  Findings: Both internal carotid arteries are widely patent into the brain.  The anterior and middle cerebral vessels are patent without proximal stenosis, aneurysm or vascular malformation.  Both vertebral arteries are widely patent to the basilar.  No basilar stenosis.  Posterior circulation branch vessels are normal.  IMPRESSION: Normal intracranial MR angiography of the large and medium-sized vessels.   Original Report Authenticated By: Nelson Chimes, M.D.   Mr Mra Head/brain Wo Cm  04/05/2013   *RADIOLOGY REPORT*  Clinical Data:  Stroke.   HIV.  Hypertension.  End-stage renal disease.  MRI HEAD WITHOUT CONTRAST MRA HEAD WITHOUT CONTRAST  Technique:  Multiplanar, multiecho pulse sequences of the brain and surrounding structures were obtained without intravenous contrast. Angiographic images of the head were obtained using MRA technique without contrast.  Comparison:  Head CT 05/24  MRI HEAD  Findings:  Diffusion imaging does not show any acute or subacute infarction.  There are mild chronic small vessel changes of the pons.  No cerebellar abnormality.  The cerebral hemispheres show scattered punctate foci of T2 and FLAIR signal within the white matter consistent with chronic small vessel disease.  No cortical or large vessel territory infarction.  No mass lesion, hemorrhage, hydrocephalus or extra-axial collection.  No pituitary mass.  No inflammatory sinus disease.  IMPRESSION: No acute finding.  Mild chronic small vessel changes throughout the brain.  MRA HEAD  Findings: Both internal carotid arteries are widely patent into the brain.  The anterior and middle cerebral vessels are patent without proximal stenosis, aneurysm or vascular malformation.  Both vertebral arteries are widely patent to the basilar.  No basilar stenosis.  Posterior circulation branch vessels are normal.  IMPRESSION: Normal intracranial MR angiography of the large and medium-sized vessels.   Original Report Authenticated By: Nelson Chimes, M.D.   ROS:  Appetite and energy good. No cough, SOB, CP, N, V, D, ROS otherwise negative.  Physical Exam: 5 ft tall Filed Vitals:   04/05/13 0247 04/05/13 0554 04/05/13 0829 04/05/13 1357  BP: 159/95 148/85 166/98 159/95  Pulse: 88 86 91 95  Temp: 97.7 F (36.5 C) 97.9 F (36.6 C) 97.4 F (36.3 C) 98.1 F (36.7 C)  TempSrc: Oral Oral Oral Oral  Resp: 20 20 20 20   Height:      Weight:      SpO2: 97% 97% 100% 100%     General: Well developed,slender, healthy appearing Head: Normocephalic, atraumatic, sclera non-icteric, mucus  membranes are moist Neck: Supple. JVD not elevated. Lungs: Clear bilaterally to auscultation without wheezes, rales, or rhonchi. Breathing is unlabored. Heart: RRR with S1 S2. No murmurs, rubs, or gallops appreciated. Abdomen: Soft, non-tender,  non-distended with normoactive bowel sounds. No rebound/guarding. No obvious abdominal masses. M-S:  Strength and tone appear normal for age. Lower extremities:without edema or ischemic changes, no open wounds  Neuro: Alert and oriented X 3. Moves all extremities spontaneously. Psych:  Responds to questions appropriately with a normal affect. Dialysis Access:left upper AVF, slightly aneurysmal + bruit and thrill  Dialysis Orders: Center:[facility] MWF 4 hours Optiflux 180 2 K 2.25 Ca 400/800 EDW 53 left upper AVF heparin 5000; no hectorol no Epo no IV Fe Recent labs:  hgb 12 4/30>11.8 5/7>11/4 5/14 > 9.2 5/21 (last Epo 11,200 4/30; tsat 51% 5/14) iPTH 96 4/09  Assessment/Plan: 1. TIA - Neuro following - sx resolved; 2. ESRD -  MWF - continue usual orders 3. Hypertension/volume  - managed with volume control and metoprolol 50 BID; BP is high pre HD and comes down during treatment; gets within 0.5 of EDW, which was raised 0.5 about 10 days ago because she was unable to tolerate decrease prior lowering of EDW in an attempt to control BP;  CXR shows significant increase in cardiac silhouette since 08/2101 - for 2 D Echo to evaluate for pericardial effusion vs dilated CM. She has had some difficulty getting to current EDW;  May need additional medication for BP control with a higher EDW; Have d/c'd IVF which are running at 100/ hr; she was started on hydralazine 25 tid; will resume her metoprolol 50 bid. 4. Anemia  - Hgb drop (see trend under dialysis orders) most likely secondary to Epo deficit - resume ESA, check FOBT; recent Fe stores were excellent not warranting IV venofer 5. Metabolic bone disease -  iPTH within goal without vitamin D; continue binders tums ex 1  ac; P well controlled in outpt setting 6. Nutrition - renal diet 7. HIV + - genotype pending - labs drawn 5/15 8. Fever ? Etiology - work up per rec of ID; she tells me neuro said no LP because results of MRI were ok.  Myriam Jacobson, PA-C [facility] [phone] 04/05/2013, 2:48 PM   Patient seen and examined.  Agree with assessment and plan as above. 43 with HIV and ESRD , low CD4 counts not on ART, presented with stroke-like symptoms and fever.  Work up negative so far (MRI, MRA of brain) for stroke and symptoms had resolved prior to arrival at [facility].  Fevers improved, blood cx''s neg x 2 so far and CXR clear. Otherwise plans as above, plan HD here tomorrow. ID is seeing pt and looks like they are starting pt on ART.  Will follow. Kelly Splinter  MD [phone] pgr    [phone] cell 04/05/2013, 4:27 PM

## 2013-04-05 NOTE — H&P (Signed)
[facility] History and Physical  Kaeden Duce Chopp [id number] DOB: Sep 05, 1976 DOA: 04/04/2013  Referring physician: Dr Rolland Porter at [facility] PCP: Louis Meckel, MD   Chief Complaint:  Sent from [facility] ED for left sided weakness for 1 day  HPI:  37 year old female with history of HIV/AIDS (last CD4 of 60 not on any ART for past 11 years due to financial issues), hypertension, end-stage renal disease on dialysis since 2012 presented to [facility] ED on 5/24 with acute onset of blurred vision followed by numbness in her left leg and arm that lasted for about 2 hours. She denied any weakness of the extremity. She then had some slurred speech. In the ED she was found to have a temperature of 101.9. Patient denies any headache, dizziness, chills, cough, URI symptoms, dysphagia, oral thrush, neck swellings, chest pain, palpitations, shortness of breath, hemoptysis, nausea, vomiting, abdominal pain, diarrhea, joint pains or weight loss. She denies any sick contacts. She denies any history of opportunistic infections. A head CT was done in the ED which was unremarkable. Blood work done showed a normal WBC, BMET was unremarkable except for changes from end-stage renal disease. LP was attempted in the ED but was unsuccessful. After discussing with me the hospitalist at [facility] she was transferred here with need for an MRI of her brain. By the time the patient was evaluated at [facility] ED her symptoms had resolved. Patient was not given any antibiotics and she did not have any fever.   Review of Systems:  Constitutional:  fever present, denies chills, diaphoresis, appetite change and fatigue.  HEENT: Denies photophobia, eye pain, redness, hearing loss, ear pain, congestion, sore throat, rhinorrhea, sneezing, mouth sores, trouble swallowing, neck pain, neck stiffness and tinnitus.   Respiratory: Denies SOB, DOE, cough, chest tightness,  and wheezing.   Cardiovascular: Denies chest pain, palpitations  and leg swelling.  Gastrointestinal: Denies nausea, vomiting, abdominal pain, diarrhea, constipation, blood in stool and abdominal distention.  Genitourinary: Patient is anuric, denies flank pain. Musculoskeletal: Denies myalgias, back pain, joint swelling, arthralgias and gait problem.  Skin: Denies pallor, rash and wound.  Neurological: left sided heaviness and  numbness, blurred vision and slurry speech. Denies dizziness, seizures, syncope, weakness, light-headedness, headaches.  Hematological: Denies adenopathy. Easy bruising, personal or family bleeding history  Psychiatric/Behavioral: Denies suicidal ideation, mood changes, confusion, nervousness, sleep disturbance and agitation   Past Medical History  Diagnosis Date  . Hypertension   . HIV infection   . Chronic kidney disease   . Anemia   . Dialysis patient     mon, wed friday   Past Surgical History  Procedure Laterality Date  . Cesarean section    . Arteriovenous graft placement  09/11/11    left arm   Social History:  reports that she quit smoking about 3 years ago. Her smoking use included Cigarettes. She smoked 0.00 packs per day for 3 years. She does not have any smokeless tobacco history on file. She reports that she does not drink alcohol or use illicit drugs.  Allergies  Allergen Reactions  . Sulfa Antibiotics Hives    No family history on file.  Prior to Admission medications   Medication Sig Start Date End Date Taking? Authorizing Provider  b complex-vitamin c-folic acid (NEPHRO-VITE) 0.8 MG TABS Take 0.8 mg by mouth at bedtime.   Yes Historical Provider, MD  metoprolol tartrate (LOPRESSOR) 25 MG tablet Take 25 mg by mouth 2 (two) times daily. Patient does not take night  dose on Tues.,Thurs.,Sat.   Yes Historical Provider, MD  epoetin alfa (EPOGEN,PROCRIT) 91478 UNIT/ML injection Inject 20,000 Units into the skin. Every two weeks     Historical Provider, MD    Physical Exam:  Filed Vitals:   04/04/13 2115  04/04/13 2351 04/05/13 0247 04/05/13 0554  BP: 158/92 160/98 159/95 148/85  Pulse: 86 86 88 86  Temp: 98 F (36.7 C) 98.1 F (36.7 C) 97.7 F (36.5 C) 97.9 F (36.6 C)  TempSrc: Oral Oral Oral Oral  Resp: 18 20 20 20   Height:      Weight:      SpO2: 98% 96% 97% 97%    Constitutional: Vital signs reviewed.  Patient is a thin built middle aged female in no acute distress Head: Normocephalic and atraumatic Mouth: no erythema or exudates, MMM Eyes: PERRL, EOMI, conjunctivae normal, No scleral icterus.  Neck: Supple,  No JVD, mass, thyromegaly, or carotid bruit present.  Cardiovascular: RRR, S1 normal, S2 normal, no MRG, pulses symmetric and intact bilaterally Pulmonary/Chest: CTAB, no wheezes, rales, or rhonchi Abdominal: Soft. Non-tender, non-distended, bowel sounds are normal, no masses, organomegaly, or guarding present.  GU: no CVA tenderness Musculoskeletal: No joint deformities, erythema, or stiffness, ROM full and no nontender Ext: no edema and no cyanosis, pulses palpable bilaterally , left upper extremity AV graft Hematology: no cervical, inginal, or axillary adenopathy.  Neurological: A&O x3, Strenght is normal and symmetric bilaterally, cranial nerve II-XII are grossly intact, no focal motor deficit, sensory intact to light touch bilaterally.  Skin: Warm, dry and intact. No rash, cyanosis, or clubbing.  Psychiatric: Normal mood and affect. speech and behavior is normal. Judgment and thought content normal. Cognition and memory are normal.   Labs on Admission:  Basic Metabolic Panel:  Recent Labs Lab 04/04/13 1539 04/04/13 1551  NA 133* 135  K 4.1 4.2  CL 91* 95*  CO2 32  --   GLUCOSE 89 85  BUN 21 22  CREATININE 5.96* 5.10*  CALCIUM 9.0  --    Liver Function Tests:  Recent Labs Lab 04/04/13 1539  AST 43*  ALT 19  ALKPHOS 121*  BILITOT 0.4  PROT 7.7  ALBUMIN 3.0*   No results found for this basename: LIPASE, AMYLASE,  in the last 168 hours No results  found for this basename: AMMONIA,  in the last 168 hours CBC:  Recent Labs Lab 04/04/13 1539 04/04/13 1551  WBC 9.8  --   NEUTROABS 6.3  --   HGB 8.8* 9.2*  HCT 27.2* 27.0*  MCV 91.0  --   PLT 173  --    Cardiac Enzymes:  Recent Labs Lab 04/04/13 1539  TROPONINI <0.30   BNP: No components found with this basename: POCBNP,  CBG:  Recent Labs Lab 04/04/13 1611  GLUCAP 80    Radiological Exams on Admission: Dg Chest 1 View  04/04/2013   *RADIOLOGY REPORT*  Clinical Data: Has aphasia.  Extremity weakness.  CHEST - 1 VIEW  Comparison: 09/11/2011  Findings: The heart is significantly enlarged, and marked change since prior study.  This raises a question of pericardial effusion or dilated cardiomyopathy.  There are no focal consolidations or pleural effusions.  No pulmonary edema.  IMPRESSION: Significant change in the appearance of the heart, raising question of new pericardial effusion or dilated cardiomyopathy.  Further evaluation with echocardiogram may be helpful.   Original Report Authenticated By: Nolon Nations, M.D.   Ct Head Wo Contrast  04/04/2013   *RADIOLOGY REPORT*  Clinical  Data: Slurred speech, weakness, aphasia  CT HEAD WITHOUT CONTRAST  Technique:  Contiguous axial images were obtained from the base of the skull through the vertex without contrast.  Comparison: None.  Findings: No evidence of parenchymal hemorrhage or extra-axial fluid collection. No mass lesion, mass effect, or midline shift.  No CT evidence of acute infarction.  Cerebral volume is age appropriate.  No ventriculomegaly.  The visualized paranasal sinuses are essentially clear. The mastoid air cells are unopacified.  No evidence of calvarial fracture.  IMPRESSION: No evidence of acute intracranial abnormality.  These results were called by telephone on 04/04/2013 at 1605 hours to Dr Rolland Porter, who verbally acknowledged these results.   Original Report Authenticated By: Julian Hy, M.D.       Assessment/Plan  Principal Problem:    TIA (transient ischemic attack) Admit to telemetry. Patient given a dose of aspirin on admission. Continue on daily aspirin CT head unremarkable. Check MRI brain, 2-D echo and EEG Check lipid panel and hemoglobin A1c. PT/OT consults Symptoms resolved completely within 3 hours of onset.  Fever Unclear etiology. Given immunocompromised state to we've HIV AIDS and associated focal neurological deficit, underlying CNS infection is high on the differential. Patient did not have any fever further. She will need an LP to rule out meningitis or CNS infections. Discussed with INR. Recommended getting MRI to rule out any mass effect. Once MRI is done we'll plan for an LP. Since CSF for cell count, culture, protein, glucose, cryptococcal antigen, toxoplasma PCR, JC virus, EBV ab, AFB. -Continue airborne precautions. -Discussed with ID ( Dr Lucianne Lei dam), recommends holding antibiotics until LP done.  HIV/AIDS Recent CD4 of 60. Hx of HIV for past 17 years. Patient not on ART for past 11 years due to financial reasons. informs being set up to follow up with ID recently.     Hypertension Continue home dose metoprolol. I will add on  when necessary hydralazine as blood pressure is elevated     ESRD on hemodialysis On HD M, W, F. received her dialysis on Friday. We will inform renal. Follows with Dr Jimmy Footman.  Abnormal chest x-ray Comments on new pericardial effusion versus dilated cardiomyopathy. Will review better on a 2-D echo.   DVT prophylaxis: Subcutaneous heparin Diet: Low-sodium  Code Status: Full code Family Communication: Mother at bedside Disposition Plan: Home once stable  Louellen Molder [facility] Pager [phone]  If 7PM-7AM, please contact night-coverage [email or web address] Password [email or web address] 04/05/2013, 8:18 AM  Total time spent: 70 minutes

## 2013-04-05 NOTE — Progress Notes (Addendum)
NEURO HOSPITALIST PROGRESS NOTE   SUBJECTIVE:                                                                                                                        Uneventful night. Offers no neurological complains. As a mater of fact, she said that she feels back to baseline. Afebrile with normal white count.   OBJECTIVE:                                                                                                                           Vital signs in last 24 hours: Temp:  [97.4 F (36.3 C)-101.9 F (38.8 C)] 97.4 F (36.3 C) (05/25 0829) Pulse Rate:  [82-97] 91 (05/25 0829) Resp:  [18-25] 20 (05/25 0829) BP: (132-179)/(77-108) 166/98 mmHg (05/25 0829) SpO2:  [96 %-100 %] 100 % (05/25 0829) Weight:  [54.432 kg (120 lb)] 54.432 kg (120 lb) (05/24 1508)  Intake/Output from previous day:   Intake/Output this shift:   Nutritional status: NPO  Past Medical History  Diagnosis Date  . Hypertension   . HIV infection   . Chronic kidney disease   . Anemia   . Dialysis patient     mon, wed friday    Neurologic Exam:  Mental Status: Alert, oriented, thought content appropriate.  Speech fluent without evidence of aphasia.  Able to follow 3 step commands without difficulty. Cranial Nerves: II: Discs flat bilaterally; Visual fields grossly normal, pupils equal, round, reactive to light and accommodation III,IV, VI: ptosis not present, extra-ocular motions intact bilaterally V,VII: smile symmetric, facial light touch sensation normal bilaterally VIII: hearing normal bilaterally IX,X: gag reflex present XI: bilateral shoulder shrug XII: midline tongue extension Motor: Right : Upper extremity   5/5    Left:     Upper extremity   5/5  Lower extremity   5/5     Lower extremity   5/5 Tone and bulk:normal tone throughout; no atrophy noted Sensory: Pinprick and light touch intact throughout, bilaterally Deep Tendon Reflexes: 2+ and  symmetric throughout Plantars: Right: downgoing   Left: downgoing Cerebellar: normal finger-to-nose,  normal heel-to-shin test Gait: No ataxia. CV: pulses palpable throughout    Lab Results: Lab Results  Component Value Date/Time  CHOL 130 03/26/2013  2:26 PM   Lipid Panel No results found for this basename: CHOL, TRIG, HDL, CHOLHDL, VLDL, LDLCALC,  in the last 72 hours  Studies/Results: Dg Chest 1 View  04/04/2013   *RADIOLOGY REPORT*  Clinical Data: Has aphasia.  Extremity weakness.  CHEST - 1 VIEW  Comparison: 09/11/2011  Findings: The heart is significantly enlarged, and marked change since prior study.  This raises a question of pericardial effusion or dilated cardiomyopathy.  There are no focal consolidations or pleural effusions.  No pulmonary edema.  IMPRESSION: Significant change in the appearance of the heart, raising question of new pericardial effusion or dilated cardiomyopathy.  Further evaluation with echocardiogram may be helpful.   Original Report Authenticated By: Nolon Nations, M.D.   Ct Head Wo Contrast  04/04/2013   *RADIOLOGY REPORT*  Clinical Data: Slurred speech, weakness, aphasia  CT HEAD WITHOUT CONTRAST  Technique:  Contiguous axial images were obtained from the base of the skull through the vertex without contrast.  Comparison: None.  Findings: No evidence of parenchymal hemorrhage or extra-axial fluid collection. No mass lesion, mass effect, or midline shift.  No CT evidence of acute infarction.  Cerebral volume is age appropriate.  No ventriculomegaly.  The visualized paranasal sinuses are essentially clear. The mastoid air cells are unopacified.  No evidence of calvarial fracture.  IMPRESSION: No evidence of acute intracranial abnormality.  These results were called by telephone on 04/04/2013 at 1605 hours to Dr Rolland Porter, who verbally acknowledged these results.   Original Report Authenticated By: Julian Hy, M.D.    MEDICATIONS                                                                                                                        I have reviewed the patient's current medications.  ASSESSMENT/PLAN:                                                                                                           37 y.o. HIV positive female, ESRD on HD,  presenting with an episode of a left visual field cut, left hemiparesis, slurred speech, and fever fever. All symptoms have resolved despite no intervention and she is now back to her baseline. Will wait for MRI brain. Will defer spinal tap at this moment. Will follow up.  Dorian Pod, MD [facility] [phone]  04/05/2013, 8:42 AM   Addendum: MRI-DWI showed no acute stroke and MRA brain is unimpressive. Patient's symptoms resolved. No further neurological intervention required at this moment. Please, call neurology  with any questions, concerns, or new neurological developments.  Dorian Pod, MD

## 2013-04-05 NOTE — Progress Notes (Signed)
Utilization review completed.  P.J. Elva Mauro,RN,BSN Case Manager [phone]  

## 2013-04-05 NOTE — Consult Note (Signed)
[facility]    Date of Admission:  04/04/2013  Date of Consult:  04/05/2013  Reason for Consult: HIV/AIDS, sudden left  sided weakness  Referring Physician: Dr. Clementeen Graham   HPI: Tiffany Golden is an 37 y.o. female past medical history significant for HIV and AIDS that was diagnosed approximately 18 years ago. Patient was initially treated for HIV while pregnant. She was followed initially at [facility] and was involved in a clinical trials there. She was on therapy for HIV until approximately 8 years ago when she came off medication. She states that the reason that she came off medication was that her insurance policy chains and her co-pay for antiretrovirals increased.   She does also have comorbid end-stage renal disease and is on hemodialysis. She therefore has Medicare in addition to a private health insurance. While she certainly may have poor coverage for medications via Medicare and her private health insurance certainly covers for HIV medications could have been arranged by either the clinic at [facility] or our own clinic for application of an AIDS drug assistance program such as SPAP.   In any case she has not been in care for her HIV. Serology had been trying to get her into our clinic and she recently had blood work done last week which showed that she had a high viral load and genotype showing well type virus at this point in time. Her CD4 count is 60.  Patient presented to the hospital yesterday with acute onset of pleuritic vision with left-sided weakness and numbness in her left arm and leg. In the emergency department she was found to be febrile to 101.9. She herself not recall any subjective fevers prior to her arrival in the emergency department.  Apparently the emergency department staff attempted a lumbar puncture were unsuccessful. CT scan of the brain was initially unrevealing. Subsequent MRI has shown evidence of any mass lesions concerning for  toxoplasmosis, CNS lymphoma or PML. Furthermore there is no have evidence of leptomeningeal enhancement.  The patient's neurological symptoms of subsided completely and she feels back to her baseline. She is currently out any fever whatsoever.  We are consulted to assist in the workup and management of this patient with HIV and AIDS who is not currently in care.  I had an extensive discussion with the patient and her mother about the nature of HIV and AIDS the nature of the various ways of covering medications and assured both of them that she should have medications covered and that we should start these medications as soon as possible. We reviewed medications of the patient believes she had been on in the past including AZT and 3TC. Reviewed possible regimens that could be started now taken into account her extensive treatment history. I assured her we would get records and [facility] to further inform therapy. In the end I decided to start her on Tivicay BID, renally dosed daily Epivir and weekly viread  I spent greater than 60 minutes with the patient and mother  including greater than 50% of time in face to face counsel of the patient re HIV, ARV medications and assistance programs  and in coordination of their care.    Past Medical History  Diagnosis Date  . Hypertension   . HIV infection   . Chronic kidney disease   . Anemia   . Dialysis patient     mon, wed friday    Past Surgical History  Procedure  Laterality Date  . Cesarean section    . Arteriovenous graft placement  09/11/11    left arm  ergies:   Allergies  Allergen Reactions  . Sulfa Antibiotics Hives     Medications: I have reviewed patients current medications as documented in Epic Anti-infectives   Start     Dose/Rate Route Frequency Ordered Stop   04/06/13 1700  azithromycin (ZITHROMAX) tablet 1,200 mg     1,200 mg Oral Weekly 04/05/13 1346     04/06/13 1000  lamiVUDine (EPIVIR) 10 MG/ML solution 25 mg      25 mg Oral Daily 04/05/13 1344     04/05/13 2200  Dolutegravir Sodium TABS 50 mg     50 mg Oral 2 times daily 04/05/13 1424     04/05/13 1700  dapsone tablet 100 mg     100 mg Oral Daily 04/05/13 1345     04/05/13 1500  tenofovir (VIREAD) tablet 300 mg     300 mg Oral Weekly 04/05/13 1344     04/05/13 1500  lamiVUDine (EPIVIR) 10 MG/ML solution 50 mg     50 mg Oral  Once 04/05/13 1344 04/05/13 1501      Social History:  reports that she quit smoking about 3 years ago. Her smoking use included Cigarettes. She smoked 0.00 packs per day for 3 years. She does not have any smokeless tobacco history on file. She reports that she does not drink alcohol or use illicit drugs.  Family History  Problem Relation Age of Onset  . Hyperlipidemia Mother   . Heart disease Father     Hx CABG  . Cancer - Other Mother     Hx partial hysterectomy    As in HPI and primary teams notes otherwise 12 point review of systems is negative  Blood pressure 159/95, pulse 95, temperature 98.1 F (36.7 C), temperature source Oral, resp. rate 20, height 5\' 4"  (1.626 m), weight 120 lb (54.432 kg), last menstrual period 04/04/2013, SpO2 100.00%. General: Alert and awake, oriented x3, not in any acute distress. HEENT: some lipodystrophy changes anicteric sclera, pupils reactive to light and accommodation, EOMI, oropharynx clear and without exudate CVS regular rate, normal r,  no murmur rubs or gallops Chest: clear to auscultation bilaterally, no wheezing, rales or rhonchi Abdomen: soft nontender, nondistended, normal bowel sounds, Extremities: no  clubbing or edema noted bilaterally Skin: no rashes Neuro: nonfocal, strength and sensation intact   Results for orders placed during the hospital encounter of 04/04/13 (from the past 48 hour(s))  ETHANOL     Status: None   Collection Time    04/04/13  3:39 PM      Result Value Range   Alcohol, Ethyl (B) <11  0 - 11 mg/dL   Comment:            LOWEST DETECTABLE  LIMIT FOR     SERUM ALCOHOL IS 11 mg/dL     FOR MEDICAL PURPOSES ONLY  PROTIME-INR     Status: None   Collection Time    04/04/13  3:39 PM      Result Value Range   Prothrombin Time 12.2  11.6 - 15.2 seconds   INR 0.91  0.00 - 1.49  APTT     Status: None   Collection Time    04/04/13  3:39 PM      Result Value Range   aPTT 37  24 - 37 seconds   Comment:  IF BASELINE aPTT IS ELEVATED,     SUGGEST PATIENT RISK ASSESSMENT     BE USED TO DETERMINE APPROPRIATE     ANTICOAGULANT THERAPY.  CBC     Status: Abnormal   Collection Time    04/04/13  3:39 PM      Result Value Range   WBC 9.8  4.0 - 10.5 K/uL   RBC 2.99 (*) 3.87 - 5.11 MIL/uL   Hemoglobin 8.8 (*) 12.0 - 15.0 g/dL   HCT 27.2 (*) 36.0 - 46.0 %   MCV 91.0  78.0 - 100.0 fL   MCH 29.4  26.0 - 34.0 pg   MCHC 32.4  30.0 - 36.0 g/dL   RDW 16.7 (*) 11.5 - 15.5 %   Platelets 173  150 - 400 K/uL  DIFFERENTIAL     Status: Abnormal   Collection Time    04/04/13  3:39 PM      Result Value Range   Neutrophils Relative % 65  43 - 77 %   Lymphocytes Relative 17  12 - 46 %   Monocytes Relative 7  3 - 12 %   Eosinophils Relative 10 (*) 0 - 5 %   Basophils Relative 1  0 - 1 %   Neutro Abs 6.3  1.7 - 7.7 K/uL   Lymphs Abs 1.7  0.7 - 4.0 K/uL   Monocytes Absolute 0.7  0.1 - 1.0 K/uL   Eosinophils Absolute 1.0 (*) 0.0 - 0.7 K/uL   Basophils Absolute 0.1  0.0 - 0.1 K/uL   WBC Morphology INCREASED BANDS (>20% BANDS)     Comment: ATYPICAL LYMPHOCYTES  COMPREHENSIVE METABOLIC PANEL     Status: Abnormal   Collection Time    04/04/13  3:39 PM      Result Value Range   Sodium 133 (*) 135 - 145 mEq/L   Potassium 4.1  3.5 - 5.1 mEq/L   Chloride 91 (*) 96 - 112 mEq/L   CO2 32  19 - 32 mEq/L   Glucose, Bld 89  70 - 99 mg/dL   BUN 21  6 - 23 mg/dL   Creatinine, Ser 5.96 (*) 0.50 - 1.10 mg/dL   Calcium 9.0  8.4 - 10.5 mg/dL   Total Protein 7.7  6.0 - 8.3 g/dL   Albumin 3.0 (*) 3.5 - 5.2 g/dL   AST 43 (*) 0 - 37 U/L   ALT 19  0  - 35 U/L   Alkaline Phosphatase 121 (*) 39 - 117 U/L   Total Bilirubin 0.4  0.3 - 1.2 mg/dL   GFR calc non Af Amer 8 (*) >90 mL/min   GFR calc Af Amer 9 (*) >90 mL/min   Comment:            The eGFR has been calculated     using the CKD EPI equation.     This calculation has not been     validated in all clinical     situations.     eGFR's persistently     <90 mL/min signify     possible Chronic Kidney Disease.  TROPONIN I     Status: None   Collection Time    04/04/13  3:39 PM      Result Value Range   Troponin I <0.30  <0.30 ng/mL   Comment:            Due to the release kinetics of cTnI,     a negative result within the first hours  of the onset of symptoms does not rule out     myocardial infarction with certainty.     If myocardial infarction is still suspected,     repeat the test at appropriate intervals.  CULTURE, BLOOD (ROUTINE X 2)     Status: None   Collection Time    04/04/13  3:43 PM      Result Value Range   Specimen Description RIGHT ANTECUBITAL     Special Requests BOTTLES DRAWN AEROBIC AND ANAEROBIC 6CC     Culture NO GROWTH 1 DAY     Report Status PENDING    POCT I-STAT TROPONIN I     Status: None   Collection Time    04/04/13  3:47 PM      Result Value Range   Troponin i, poc 0.00  0.00 - 0.08 ng/mL   Comment 3            Comment: Due to the release kinetics of cTnI,     a negative result within the first hours     of the onset of symptoms does not rule out     myocardial infarction with certainty.     If myocardial infarction is still suspected,     repeat the test at appropriate intervals.  CULTURE, BLOOD (ROUTINE X 2)     Status: None   Collection Time    04/04/13  3:48 PM      Result Value Range   Specimen Description BLOOD RIGHT HAND     Special Requests BOTTLES DRAWN AEROBIC AND ANAEROBIC 6CC     Culture NO GROWTH 1 DAY     Report Status PENDING    POCT I-STAT, CHEM 8     Status: Abnormal   Collection Time    04/04/13  3:51 PM       Result Value Range   Sodium 135  135 - 145 mEq/L   Potassium 4.2  3.5 - 5.1 mEq/L   Chloride 95 (*) 96 - 112 mEq/L   BUN 22  6 - 23 mg/dL   Creatinine, Ser 5.10 (*) 0.50 - 1.10 mg/dL   Glucose, Bld 85  70 - 99 mg/dL   Calcium, Ion 1.12  1.12 - 1.23 mmol/L   TCO2 32  0 - 100 mmol/L   Hemoglobin 9.2 (*) 12.0 - 15.0 g/dL   HCT 27.0 (*) 36.0 - 46.0 %  GLUCOSE, CAPILLARY     Status: None   Collection Time    04/04/13  4:11 PM      Result Value Range   Glucose-Capillary 80  70 - 99 mg/dL      Component Value Date/Time   SDES BLOOD RIGHT HAND 04/04/2013 1548   SPECREQUEST BOTTLES DRAWN AEROBIC AND ANAEROBIC 6CC 04/04/2013 1548   CULT NO GROWTH 1 DAY 04/04/2013 1548   REPTSTATUS PENDING 04/04/2013 1548   Dg Chest 1 View  04/04/2013   *RADIOLOGY REPORT*  Clinical Data: Has aphasia.  Extremity weakness.  CHEST - 1 VIEW  Comparison: 09/11/2011  Findings: The heart is significantly enlarged, and marked change since prior study.  This raises a question of pericardial effusion or dilated cardiomyopathy.  There are no focal consolidations or pleural effusions.  No pulmonary edema.  IMPRESSION: Significant change in the appearance of the heart, raising question of new pericardial effusion or dilated cardiomyopathy.  Further evaluation with echocardiogram may be helpful.   Original Report Authenticated By: Nolon Nations, M.D.   Ct Head Wo Contrast  04/04/2013   *  RADIOLOGY REPORT*  Clinical Data: Slurred speech, weakness, aphasia  CT HEAD WITHOUT CONTRAST  Technique:  Contiguous axial images were obtained from the base of the skull through the vertex without contrast.  Comparison: None.  Findings: No evidence of parenchymal hemorrhage or extra-axial fluid collection. No mass lesion, mass effect, or midline shift.  No CT evidence of acute infarction.  Cerebral volume is age appropriate.  No ventriculomegaly.  The visualized paranasal sinuses are essentially clear. The mastoid air cells are unopacified.  No  evidence of calvarial fracture.  IMPRESSION: No evidence of acute intracranial abnormality.  These results were called by telephone on 04/04/2013 at 1605 hours to Dr Rolland Porter, who verbally acknowledged these results.   Original Report Authenticated By: Julian Hy, M.D.   Mr Brain Wo Contrast  04/05/2013   *RADIOLOGY REPORT*  Clinical Data:  Stroke.  HIV.  Hypertension.  End-stage renal disease.  MRI HEAD WITHOUT CONTRAST MRA HEAD WITHOUT CONTRAST  Technique:  Multiplanar, multiecho pulse sequences of the brain and surrounding structures were obtained without intravenous contrast. Angiographic images of the head were obtained using MRA technique without contrast.  Comparison:  Head CT 05/24  MRI HEAD  Findings:  Diffusion imaging does not show any acute or subacute infarction.  There are mild chronic small vessel changes of the pons.  No cerebellar abnormality.  The cerebral hemispheres show scattered punctate foci of T2 and FLAIR signal within the white matter consistent with chronic small vessel disease.  No cortical or large vessel territory infarction.  No mass lesion, hemorrhage, hydrocephalus or extra-axial collection.  No pituitary mass.  No inflammatory sinus disease.  IMPRESSION: No acute finding.  Mild chronic small vessel changes throughout the brain.  MRA HEAD  Findings: Both internal carotid arteries are widely patent into the brain.  The anterior and middle cerebral vessels are patent without proximal stenosis, aneurysm or vascular malformation.  Both vertebral arteries are widely patent to the basilar.  No basilar stenosis.  Posterior circulation branch vessels are normal.  IMPRESSION: Normal intracranial MR angiography of the large and medium-sized vessels.   Original Report Authenticated By: Nelson Chimes, M.D.   Mr Mra Head/brain Wo Cm  04/05/2013   *RADIOLOGY REPORT*  Clinical Data:  Stroke.  HIV.  Hypertension.  End-stage renal disease.  MRI HEAD WITHOUT CONTRAST MRA HEAD WITHOUT  CONTRAST  Technique:  Multiplanar, multiecho pulse sequences of the brain and surrounding structures were obtained without intravenous contrast. Angiographic images of the head were obtained using MRA technique without contrast.  Comparison:  Head CT 05/24  MRI HEAD  Findings:  Diffusion imaging does not show any acute or subacute infarction.  There are mild chronic small vessel changes of the pons.  No cerebellar abnormality.  The cerebral hemispheres show scattered punctate foci of T2 and FLAIR signal within the white matter consistent with chronic small vessel disease.  No cortical or large vessel territory infarction.  No mass lesion, hemorrhage, hydrocephalus or extra-axial collection.  No pituitary mass.  No inflammatory sinus disease.  IMPRESSION: No acute finding.  Mild chronic small vessel changes throughout the brain.  MRA HEAD  Findings: Both internal carotid arteries are widely patent into the brain.  The anterior and middle cerebral vessels are patent without proximal stenosis, aneurysm or vascular malformation.  Both vertebral arteries are widely patent to the basilar.  No basilar stenosis.  Posterior circulation branch vessels are normal.  IMPRESSION: Normal intracranial MR angiography of the large and medium-sized vessels.   Original  Report Authenticated By: Nelson Chimes, M.D.     Recent Results (from the past 720 hour(s))  CULTURE, BLOOD (ROUTINE X 2)     Status: None   Collection Time    04/04/13  3:43 PM      Result Value Range Status   Specimen Description RIGHT ANTECUBITAL   Final   Special Requests BOTTLES DRAWN AEROBIC AND ANAEROBIC 6CC   Final   Culture NO GROWTH 1 DAY   Final   Report Status PENDING   Incomplete  CULTURE, BLOOD (ROUTINE X 2)     Status: None   Collection Time    04/04/13  3:48 PM      Result Value Range Status   Specimen Description BLOOD RIGHT HAND   Final   Special Requests BOTTLES DRAWN AEROBIC AND ANAEROBIC 6CC   Final   Culture NO GROWTH 1 DAY   Final     Report Status PENDING   Incomplete     Impression/Recommendation  37 year old lady with with HIV and AIDS not on antiretrovirals for 8 years admitted with left-sided weakness and numbness that resolved.  #1 transient left-sided weakness and numbness: This is most likely to have been a TIA given the fact the patient has also been hypertensive. Her MRI of the brain fails to show any lesions such as ones that would show evidence for toxoplasmosis primary CNS lymphoma or progressive multifocal leukoencephalopathy. There are no evidence cryptococcoma's.. currently is without headache to suggest occult cryptococcal meningitis.  --Do not feel strongly that she needs a lumbar puncture at this point in time. --I. will check serum cryptococcal antigen  #2 HIV/AIDS:  --I will start her on renally adjusted ARV with TIVICAY 50mg  BID (given renal disease uptitrated dose) epivir and weekly viread  --I have put consult in for case management to inquire as to cost of her ARV with her current medicare and private insurance --She very well may need application for SPAP assistance --if we cannot procure immediate ARV assiatance I will see on Tuesday if we have any pills returned from pts to allow [place] to bridge her on therapy until coverage is fully supplied  #3 OI prophylaxis: start dapsone, check G6pd and start azithromycin weekly prior to her being dc to the hospital  Dr. Johnnye Sima is back tomorrow.   Thank you so much for this interesting consult  [facility] [phone] (pager) [phone] (office) 04/05/2013, 6:05 PM  Rh[facility] [place] 04/05/2013, 6:05 PM

## 2013-04-06 DIAGNOSIS — I10 Essential (primary) hypertension: Secondary | ICD-10-CM

## 2013-04-06 DIAGNOSIS — Z21 Asymptomatic human immunodeficiency virus [HIV] infection status: Secondary | ICD-10-CM

## 2013-04-06 LAB — RENAL FUNCTION PANEL
Albumin: 2.4 g/dL — ABNORMAL LOW (ref 3.5–5.2)
BUN: 51 mg/dL — ABNORMAL HIGH (ref 6–23)
CO2: 23 mEq/L (ref 19–32)
Calcium: 8.1 mg/dL — ABNORMAL LOW (ref 8.4–10.5)
Chloride: 94 mEq/L — ABNORMAL LOW (ref 96–112)
Creatinine, Ser: 9.87 mg/dL — ABNORMAL HIGH (ref 0.50–1.10)
GFR calc Af Amer: 5 mL/min — ABNORMAL LOW (ref >90)
GFR calc non Af Amer: 4 mL/min — ABNORMAL LOW (ref >90)
Glucose, Bld: 100 mg/dL — ABNORMAL HIGH (ref 70–99)
Phosphorus: 5.2 mg/dL — ABNORMAL HIGH (ref 2.3–4.6)
Potassium: 4.9 mEq/L (ref 3.5–5.1)
Sodium: 133 mEq/L — ABNORMAL LOW (ref 135–145)

## 2013-04-06 LAB — CRYPTOCOCCAL ANTIGEN: Crypto Ag: NEGATIVE

## 2013-04-06 LAB — CBC
HCT: 22.6 % — ABNORMAL LOW (ref 36.0–46.0)
Hemoglobin: 7.6 g/dL — ABNORMAL LOW (ref 12.0–15.0)
MCH: 29.7 pg (ref 26.0–34.0)
MCHC: 33.6 g/dL (ref 30.0–36.0)
MCV: 88.3 fL (ref 78.0–100.0)
Platelets: 193 10*3/uL (ref 150–400)
RBC: 2.56 MIL/uL — ABNORMAL LOW (ref 3.87–5.11)
RDW: 16.5 % — ABNORMAL HIGH (ref 11.5–15.5)
WBC: 7.5 10*3/uL (ref 4.0–10.5)

## 2013-04-06 LAB — BASIC METABOLIC PANEL
GFR calc Af Amer: 6 mL/min — ABNORMAL LOW (ref >90)
GFR calc non Af Amer: 5 mL/min — ABNORMAL LOW (ref >90)
Potassium: 4.8 mEq/L (ref 3.5–5.1)
Sodium: 134 mEq/L — ABNORMAL LOW (ref 135–145)

## 2013-04-06 LAB — HEMOGLOBIN A1C: Hgb A1c MFr Bld: 5.1 % (ref <5.7)

## 2013-04-06 LAB — LIPID PANEL: LDL Cholesterol: 71 mg/dL (ref 0–99)

## 2013-04-06 MED ORDER — HEPARIN SODIUM (PORCINE) 1000 UNIT/ML DIALYSIS: 1000.0000 [IU] | INTRAMUSCULAR | Status: DC | PRN

## 2013-04-06 MED ORDER — CAMPHOR-MENTHOL 0.5-0.5 % EX LOTN: 1.0000 "application " | TOPICAL_LOTION | Freq: Three times a day (TID) | CUTANEOUS | Status: DC | PRN

## 2013-04-06 MED ORDER — ONDANSETRON HCL 4 MG PO TABS: 4.0000 mg | ORAL_TABLET | Freq: Four times a day (QID) | ORAL | Status: DC | PRN

## 2013-04-06 MED ORDER — LIDOCAINE HCL (PF) 1 % IJ SOLN: 5.0000 mL | INTRAMUSCULAR | Status: DC | PRN

## 2013-04-06 MED ORDER — LIDOCAINE-PRILOCAINE 2.5-2.5 % EX CREA: 1.0000 "application " | TOPICAL_CREAM | CUTANEOUS | Status: DC | PRN

## 2013-04-06 MED ORDER — SORBITOL 70 % SOLN
30.0000 mL | Status: DC | PRN
  Filled 2013-04-06: qty 30

## 2013-04-06 MED ORDER — ACETAMINOPHEN 650 MG RE SUPP
650.0000 mg | Freq: Four times a day (QID) | RECTAL | Status: DC | PRN
  Filled 2013-04-06: qty 1

## 2013-04-06 MED ORDER — BOOST / RESOURCE BREEZE PO LIQD
1.0000 | Freq: Two times a day (BID) | ORAL | Status: DC
  Administered 2013-04-06 – 2013-04-08 (×5): 1 via ORAL

## 2013-04-06 MED ORDER — NEPRO/CARBSTEADY PO LIQD
237.0000 mL | Freq: Three times a day (TID) | ORAL | Status: DC | PRN
  Filled 2013-04-06: qty 237

## 2013-04-06 MED ORDER — SODIUM CHLORIDE 0.9 % IV SOLN: 100.0000 mL | INTRAVENOUS | Status: DC | PRN

## 2013-04-06 MED ORDER — ZOLPIDEM TARTRATE 5 MG PO TABS
5.0000 mg | ORAL_TABLET | Freq: Every evening | ORAL | Status: DC | PRN
  Administered 2013-04-07 (×2): 5 mg via ORAL
  Filled 2013-04-06 (×2): qty 1

## 2013-04-06 MED ORDER — DOCUSATE SODIUM 283 MG RE ENEM
1.0000 | ENEMA | RECTAL | Status: DC | PRN
  Filled 2013-04-06: qty 1

## 2013-04-06 MED ORDER — ALTEPLASE 2 MG IJ SOLR: 2.0000 mg | Freq: Once | INTRAMUSCULAR | Status: DC | PRN

## 2013-04-06 MED ORDER — ACETAMINOPHEN 325 MG PO TABS
650.0000 mg | ORAL_TABLET | Freq: Four times a day (QID) | ORAL | Status: DC | PRN
  Administered 2013-04-06 – 2013-04-08 (×5): 650 mg via ORAL
  Filled 2013-04-06 (×3): qty 2

## 2013-04-06 MED ORDER — CALCIUM CARBONATE 1250 MG/5ML PO SUSP
500.0000 mg | Freq: Four times a day (QID) | ORAL | Status: DC | PRN
  Filled 2013-04-06: qty 5

## 2013-04-06 MED ORDER — PENTAFLUOROPROP-TETRAFLUOROETH EX AERO: 1.0000 "application " | INHALATION_SPRAY | CUTANEOUS | Status: DC | PRN

## 2013-04-06 MED ORDER — NEPRO/CARBSTEADY PO LIQD: 237.0000 mL | ORAL | Status: DC | PRN

## 2013-04-06 MED ORDER — HYDROXYZINE HCL 25 MG PO TABS: 25.0000 mg | ORAL_TABLET | Freq: Three times a day (TID) | ORAL | Status: DC | PRN

## 2013-04-06 MED ORDER — HEPARIN SODIUM (PORCINE) 1000 UNIT/ML DIALYSIS: 20.0000 [IU]/kg | INTRAMUSCULAR | Status: DC | PRN

## 2013-04-06 MED ORDER — ONDANSETRON HCL 4 MG/2ML IJ SOLN: 4.0000 mg | Freq: Four times a day (QID) | INTRAMUSCULAR | Status: DC | PRN

## 2013-04-06 NOTE — Progress Notes (Signed)
INFECTIOUS DISEASE PROGRESS NOTE  ID: Tiffany Golden is a 37 y.o. female with  Principal Problem:   TIA (transient ischemic attack) Active Problems:   Hypertension   Fever   ESRD on hemodialysis   AIDS  Subjective: Without complaints  Abtx:  Anti-infectives   Start     Dose/Rate Route Frequency Ordered Stop   04/06/13 1700  azithromycin (ZITHROMAX) tablet 1,200 mg     1,200 mg Oral Weekly 04/05/13 1346     04/06/13 1000  lamiVUDine (EPIVIR) 10 MG/ML solution 25 mg     25 mg Oral Daily 04/05/13 1344     04/05/13 2200  Dolutegravir Sodium TABS 50 mg     50 mg Oral 2 times daily 04/05/13 1424     04/05/13 1700  dapsone tablet 100 mg     100 mg Oral Daily 04/05/13 1345     04/05/13 1500  tenofovir (VIREAD) tablet 300 mg     300 mg Oral Weekly 04/05/13 1344     04/05/13 1500  lamiVUDine (EPIVIR) 10 MG/ML solution 50 mg     50 mg Oral  Once 04/05/13 1344 04/05/13 1501      Medications:  Scheduled: . aspirin  325 mg Oral Daily  . azithromycin  1,200 mg Oral Weekly  . calcium carbonate  1 tablet Oral TID WC  . dapsone  100 mg Oral Daily  . darbepoetin (ARANESP) injection - DIALYSIS  60 mcg Intravenous Q Mon-HD  . Dolutegravir Sodium  50 mg Oral BID  . feeding supplement  1 Container Oral BID BM  . heparin  5,000 Units Subcutaneous Q8H  . hydrALAZINE  25 mg Oral Q8H  . lamiVUDine  25 mg Oral Daily  . metoprolol tartrate  50 mg Oral BID  . multivitamin  1 tablet Oral Q supper  . tenofovir  300 mg Oral Weekly    Objective: Vital signs in last 24 hours: Temp:  [98 F (36.7 C)-99.2 F (37.3 C)] 99.1 F (37.3 C) (05/26 1000) Pulse Rate:  [87-96] 95 (05/26 1000) Resp:  [18-20] 18 (05/26 1000) BP: (139-173)/(83-99) 152/85 mmHg (05/26 1000) SpO2:  [99 %-100 %] 100 % (05/26 1000)   General appearance: alert, cooperative and no distress Resp: clear to auscultation bilaterally Cardio: regular rate and rhythm GI: normal findings: bowel sounds normal and soft,  non-tender Extremities: edema none and LUE AVF with strong bruit  Lab Results  Recent Labs  04/04/13 1539 04/04/13 1551 04/06/13 0345  WBC 9.8  --   --   HGB 8.8* 9.2*  --   HCT 27.2* 27.0*  --   NA 133* 135 134*  K 4.1 4.2 4.8  CL 91* 95* 95*  CO2 32  --  26  BUN 21 22 42*  CREATININE 5.96* 5.10* 8.73*   Liver Panel  Recent Labs  04/04/13 1539  PROT 7.7  ALBUMIN 3.0*  AST 43*  ALT 19  ALKPHOS 121*  BILITOT 0.4   Sedimentation Rate No results found for this basename: ESRSEDRATE,  in the last 72 hours C-Reactive Protein No results found for this basename: CRP,  in the last 72 hours  Microbiology: Recent Results (from the past 240 hour(s))  CULTURE, BLOOD (ROUTINE X 2)     Status: None   Collection Time    04/04/13  3:43 PM      Result Value Range Status   Specimen Description RIGHT ANTECUBITAL   Final   Special Requests BOTTLES DRAWN AEROBIC AND ANAEROBIC  [place]   Final   Culture NO GROWTH 2 DAYS   Final   Report Status PENDING   Incomplete  CULTURE, BLOOD (ROUTINE X 2)     Status: None   Collection Time    04/04/13  3:48 PM      Result Value Range Status   Specimen Description BLOOD RIGHT HAND   Final   Special Requests BOTTLES DRAWN AEROBIC AND ANAEROBIC 6CC   Final   Culture NO GROWTH 2 DAYS   Final   Report Status PENDING   Incomplete    Studies/Results: Dg Chest 1 View  04/04/2013   *RADIOLOGY REPORT*  Clinical Data: Has aphasia.  Extremity weakness.  CHEST - 1 VIEW  Comparison: 09/11/2011  Findings: The heart is significantly enlarged, and marked change since prior study.  This raises a question of pericardial effusion or dilated cardiomyopathy.  There are no focal consolidations or pleural effusions.  No pulmonary edema.  IMPRESSION: Significant change in the appearance of the heart, raising question of new pericardial effusion or dilated cardiomyopathy.  Further evaluation with echocardiogram may be helpful.   Original Report Authenticated By: Nolon Nations, M.D.   Ct Head Wo Contrast  04/04/2013   *RADIOLOGY REPORT*  Clinical Data: Slurred speech, weakness, aphasia  CT HEAD WITHOUT CONTRAST  Technique:  Contiguous axial images were obtained from the base of the skull through the vertex without contrast.  Comparison: None.  Findings: No evidence of parenchymal hemorrhage or extra-axial fluid collection. No mass lesion, mass effect, or midline shift.  No CT evidence of acute infarction.  Cerebral volume is age appropriate.  No ventriculomegaly.  The visualized paranasal sinuses are essentially clear. The mastoid air cells are unopacified.  No evidence of calvarial fracture.  IMPRESSION: No evidence of acute intracranial abnormality.  These results were called by telephone on 04/04/2013 at 1605 hours to Dr Rolland Porter, who verbally acknowledged these results.   Original Report Authenticated By: Julian Hy, M.D.   Mr Brain Wo Contrast  04/05/2013   *RADIOLOGY REPORT*  Clinical Data:  Stroke.  HIV.  Hypertension.  End-stage renal disease.  MRI HEAD WITHOUT CONTRAST MRA HEAD WITHOUT CONTRAST  Technique:  Multiplanar, multiecho pulse sequences of the brain and surrounding structures were obtained without intravenous contrast. Angiographic images of the head were obtained using MRA technique without contrast.  Comparison:  Head CT 05/24  MRI HEAD  Findings:  Diffusion imaging does not show any acute or subacute infarction.  There are mild chronic small vessel changes of the pons.  No cerebellar abnormality.  The cerebral hemispheres show scattered punctate foci of T2 and FLAIR signal within the white matter consistent with chronic small vessel disease.  No cortical or large vessel territory infarction.  No mass lesion, hemorrhage, hydrocephalus or extra-axial collection.  No pituitary mass.  No inflammatory sinus disease.  IMPRESSION: No acute finding.  Mild chronic small vessel changes throughout the brain.  MRA HEAD  Findings: Both internal carotid arteries  are widely patent into the brain.  The anterior and middle cerebral vessels are patent without proximal stenosis, aneurysm or vascular malformation.  Both vertebral arteries are widely patent to the basilar.  No basilar stenosis.  Posterior circulation branch vessels are normal.  IMPRESSION: Normal intracranial MR angiography of the large and medium-sized vessels.   Original Report Authenticated By: Nelson Chimes, M.D.   Mr Mra Head/brain Wo Cm  04/05/2013   *RADIOLOGY REPORT*  Clinical Data:  Stroke.  HIV.  Hypertension.  End-stage  renal disease.  MRI HEAD WITHOUT CONTRAST MRA HEAD WITHOUT CONTRAST  Technique:  Multiplanar, multiecho pulse sequences of the brain and surrounding structures were obtained without intravenous contrast. Angiographic images of the head were obtained using MRA technique without contrast.  Comparison:  Head CT 05/24  MRI HEAD  Findings:  Diffusion imaging does not show any acute or subacute infarction.  There are mild chronic small vessel changes of the pons.  No cerebellar abnormality.  The cerebral hemispheres show scattered punctate foci of T2 and FLAIR signal within the white matter consistent with chronic small vessel disease.  No cortical or large vessel territory infarction.  No mass lesion, hemorrhage, hydrocephalus or extra-axial collection.  No pituitary mass.  No inflammatory sinus disease.  IMPRESSION: No acute finding.  Mild chronic small vessel changes throughout the brain.  MRA HEAD  Findings: Both internal carotid arteries are widely patent into the brain.  The anterior and middle cerebral vessels are patent without proximal stenosis, aneurysm or vascular malformation.  Both vertebral arteries are widely patent to the basilar.  No basilar stenosis.  Posterior circulation branch vessels are normal.  IMPRESSION: Normal intracranial MR angiography of the large and medium-sized vessels.   Original Report Authenticated By: Nelson Chimes, M.D.     Assessment/Plan: AIDS (off  ART) ESRD Neurologic syndrome Fever (resolved since adm)  Total days of antibiotics: 2 (DTV/3TC/TFV) Have asked SW/CM to help with getting her ART as outpt. 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    Bobby Rumpf Infectious Diseases [email or web address] 04/06/2013, 12:42 PM   LOS: 2 days

## 2013-04-06 NOTE — Care Management Note (Signed)
CARE MANAGEMENT NOTE 04/06/2013  Patient:  Tiffany Golden, Tiffany Golden   Account Number:  [id number]  Date Initiated:  04/06/2013  Documentation initiated by:  Ricki Miller  Subjective/Objective Assessment:   37 yr old female admitted for slurred speech, blurred vision and numbness.Patient is ESRD, with HX of HIV.     Action/Plan:   CM spoke with patient concerning needs for medication assistance. Patient has appointment at [facility] on this week. CM explained to her that they will provide assistance with HIV meds as needed.   Anticipated DC Date:  04/07/2013   Anticipated DC Plan:  [place]  CM consult      PAC Choice  NA   Choice offered to / List presented to:             Status of service:  Completed, signed off Medicare Important Message given?   (If response is "NO", the following Medicare IM given date fields will be blank) Date Medicare IM given:   Date Additional Medicare IM given:    Discharge Disposition:  HOME/SELF CARE  Per UR Regulation:    If discussed at Long Length of Stay Meetings, dates discussed:    Comments:

## 2013-04-06 NOTE — Progress Notes (Signed)
Pt HR increased to as high as 140. C/O of being cold and shivering. Temp 101 oral. Dr. Marval Regal called. Informed of pts high HR, increased temp, and pt having liquid brown stools. Blood cultures drawn, C-diff PCR ordered, and tylenol given po.

## 2013-04-06 NOTE — Progress Notes (Signed)
I agree with the following treatment note after reviewing documentation.   Johnston, Darly Massi Brynn   OTR/L Pager: [phone] Office: [phone] .   

## 2013-04-06 NOTE — Progress Notes (Signed)
[facility] PROGRESS NOTE  Tiffany Golden [id number] DOB: [birth year] DOA: 04/04/2013 PCP: Louis Meckel, MD  Assessment/Plan: TIA -Symptoms resolved. -MRI/ECHO/carotid dopplers WNL. -PT recommends no further follow up.  HIV/AIDS -CD4 60. -Has follow up at [facility] for ARTs. -Dapsone/azithro for prophylaxis.  ESRD -On HD M-W-F. -For HD later today.  Fever -Etiology remains unclear. -Per ID, since one isolated episode and neurologic symptoms resolved, can defer LP.  Abnormal CXR -Concerning for pericardial effusion vs dilated CM. -ECHO: Left ventricle: The cavity size was normal. Wall thickness was normal. Systolic function was vigorous. The estimated ejection fraction was in the range of 65% to 70%. Wall motion was normal; there were no regional wall motion abnormalities. No evidence for pericardial effusion,   Code Status: Full Family Communication: None today  Disposition Plan: Home on 04/07/13   Consultants:  ID, Dr. Johnnye Sima  Nephrology, Dr. Lorrene Reid   Antibiotics:  Azithro/dapsone for prophylaxis   Subjective: No complaints. Prefers to DC in am as she will finish HD late today.  Objective: Filed Vitals:   04/05/13 2200 04/06/13 0200 04/06/13 0600 04/06/13 1000  BP: 173/99 139/83 168/96 152/85  Pulse: 87 89 96 95  Temp: 98 F (36.7 C) 98.2 F (36.8 C) 99.2 F (37.3 C) 99.1 F (37.3 C)  TempSrc: Oral Oral Oral Oral  Resp: 20 20 20 18   Height:      Weight:      SpO2: 100% 100% 99% 100%   No intake or output data in the 24 hours ending 04/06/13 1423 Filed Weights   04/04/13 1508  Weight: 54.432 kg (120 lb)    Exam:   General:  AA Ox3, NAD  Cardiovascular: RRR, no M/R/G  Respiratory: CTA B  Abdomen: S/NT/ND/+BS, no masses or organomegaly noted.  Extremities: no C/C/E, +pedal pulses  Neurologic:  Grossly intact and non-focal.  Data Reviewed: Basic Metabolic Panel:  Recent Labs Lab 04/04/13 1539 04/04/13 1551  04/06/13 0345  NA 133* 135 134*  K 4.1 4.2 4.8  CL 91* 95* 95*  CO2 32  --  26  GLUCOSE 89 85 83  BUN 21 22 42*  CREATININE 5.96* 5.10* 8.73*  CALCIUM 9.0  --  8.8   Liver Function Tests:  Recent Labs Lab 04/04/13 1539  AST 43*  ALT 19  ALKPHOS 121*  BILITOT 0.4  PROT 7.7  ALBUMIN 3.0*   No results found for this basename: LIPASE, AMYLASE,  in the last 168 hours No results found for this basename: AMMONIA,  in the last 168 hours CBC:  Recent Labs Lab 04/04/13 1539 04/04/13 1551  WBC 9.8  --   NEUTROABS 6.3  --   HGB 8.8* 9.2*  HCT 27.2* 27.0*  MCV 91.0  --   PLT 173  --    Cardiac Enzymes:  Recent Labs Lab 04/04/13 1539  TROPONINI <0.30   BNP (last 3 results) No results found for this basename: PROBNP,  in the last 8760 hours CBG:  Recent Labs Lab 04/04/13 1611  GLUCAP 80    Recent Results (from the past 240 hour(s))  CULTURE, BLOOD (ROUTINE X 2)     Status: None   Collection Time    04/04/13  3:43 PM      Result Value Range Status   Specimen Description RIGHT ANTECUBITAL   Final   Special Requests BOTTLES DRAWN AEROBIC AND ANAEROBIC 6CC   Final   Culture NO GROWTH 2 DAYS   Final   Report Status PENDING  Incomplete  CULTURE, BLOOD (ROUTINE X 2)     Status: None   Collection Time    04/04/13  3:48 PM      Result Value Range Status   Specimen Description BLOOD RIGHT HAND   Final   Special Requests BOTTLES DRAWN AEROBIC AND ANAEROBIC 6CC   Final   Culture NO GROWTH 2 DAYS   Final   Report Status PENDING   Incomplete     Studies: Dg Chest 1 View  04/04/2013   *RADIOLOGY REPORT*  Clinical Data: Has aphasia.  Extremity weakness.  CHEST - 1 VIEW  Comparison: 09/11/2011  Findings: The heart is significantly enlarged, and marked change since prior study.  This raises a question of pericardial effusion or dilated cardiomyopathy.  There are no focal consolidations or pleural effusions.  No pulmonary edema.  IMPRESSION: Significant change in the  appearance of the heart, raising question of new pericardial effusion or dilated cardiomyopathy.  Further evaluation with echocardiogram may be helpful.   Original Report Authenticated By: Nolon Nations, M.D.   Ct Head Wo Contrast  04/04/2013   *RADIOLOGY REPORT*  Clinical Data: Slurred speech, weakness, aphasia  CT HEAD WITHOUT CONTRAST  Technique:  Contiguous axial images were obtained from the base of the skull through the vertex without contrast.  Comparison: None.  Findings: No evidence of parenchymal hemorrhage or extra-axial fluid collection. No mass lesion, mass effect, or midline shift.  No CT evidence of acute infarction.  Cerebral volume is age appropriate.  No ventriculomegaly.  The visualized paranasal sinuses are essentially clear. The mastoid air cells are unopacified.  No evidence of calvarial fracture.  IMPRESSION: No evidence of acute intracranial abnormality.  These results were called by telephone on 04/04/2013 at 1605 hours to Dr Rolland Porter, who verbally acknowledged these results.   Original Report Authenticated By: Julian Hy, M.D.   Mr Brain Wo Contrast  04/05/2013   *RADIOLOGY REPORT*  Clinical Data:  Stroke.  HIV.  Hypertension.  End-stage renal disease.  MRI HEAD WITHOUT CONTRAST MRA HEAD WITHOUT CONTRAST  Technique:  Multiplanar, multiecho pulse sequences of the brain and surrounding structures were obtained without intravenous contrast. Angiographic images of the head were obtained using MRA technique without contrast.  Comparison:  Head CT 05/24  MRI HEAD  Findings:  Diffusion imaging does not show any acute or subacute infarction.  There are mild chronic small vessel changes of the pons.  No cerebellar abnormality.  The cerebral hemispheres show scattered punctate foci of T2 and FLAIR signal within the white matter consistent with chronic small vessel disease.  No cortical or large vessel territory infarction.  No mass lesion, hemorrhage, hydrocephalus or extra-axial  collection.  No pituitary mass.  No inflammatory sinus disease.  IMPRESSION: No acute finding.  Mild chronic small vessel changes throughout the brain.  MRA HEAD  Findings: Both internal carotid arteries are widely patent into the brain.  The anterior and middle cerebral vessels are patent without proximal stenosis, aneurysm or vascular malformation.  Both vertebral arteries are widely patent to the basilar.  No basilar stenosis.  Posterior circulation branch vessels are normal.  IMPRESSION: Normal intracranial MR angiography of the large and medium-sized vessels.   Original Report Authenticated By: Nelson Chimes, M.D.   Mr Mra Head/brain Wo Cm  04/05/2013   *RADIOLOGY REPORT*  Clinical Data:  Stroke.  HIV.  Hypertension.  End-stage renal disease.  MRI HEAD WITHOUT CONTRAST MRA HEAD WITHOUT CONTRAST  Technique:  Multiplanar, multiecho pulse sequences of the  brain and surrounding structures were obtained without intravenous contrast. Angiographic images of the head were obtained using MRA technique without contrast.  Comparison:  Head CT 05/24  MRI HEAD  Findings:  Diffusion imaging does not show any acute or subacute infarction.  There are mild chronic small vessel changes of the pons.  No cerebellar abnormality.  The cerebral hemispheres show scattered punctate foci of T2 and FLAIR signal within the white matter consistent with chronic small vessel disease.  No cortical or large vessel territory infarction.  No mass lesion, hemorrhage, hydrocephalus or extra-axial collection.  No pituitary mass.  No inflammatory sinus disease.  IMPRESSION: No acute finding.  Mild chronic small vessel changes throughout the brain.  MRA HEAD  Findings: Both internal carotid arteries are widely patent into the brain.  The anterior and middle cerebral vessels are patent without proximal stenosis, aneurysm or vascular malformation.  Both vertebral arteries are widely patent to the basilar.  No basilar stenosis.  Posterior circulation  branch vessels are normal.  IMPRESSION: Normal intracranial MR angiography of the large and medium-sized vessels.   Original Report Authenticated By: Nelson Chimes, M.D.    Scheduled Meds: . aspirin  325 mg Oral Daily  . azithromycin  1,200 mg Oral Weekly  . calcium carbonate  1 tablet Oral TID WC  . dapsone  100 mg Oral Daily  . darbepoetin (ARANESP) injection - DIALYSIS  60 mcg Intravenous Q Mon-HD  . Dolutegravir Sodium  50 mg Oral BID  . feeding supplement  1 Container Oral BID BM  . heparin  5,000 Units Subcutaneous Q8H  . hydrALAZINE  25 mg Oral Q8H  . lamiVUDine  25 mg Oral Daily  . metoprolol tartrate  50 mg Oral BID  . multivitamin  1 tablet Oral Q supper  . tenofovir  300 mg Oral Weekly   Continuous Infusions:   Principal Problem:   TIA (transient ischemic attack) Active Problems:   Hypertension   Fever   ESRD on hemodialysis   AIDS    Time spent: 35 minutes.    Lelon Frohlich  [facility] Pager [phone]  If 7PM-7AM, please contact night-coverage at [email or web address], password [facility] 04/06/2013, 2:23 PM  LOS: 2 days

## 2013-04-06 NOTE — Progress Notes (Signed)
[facility] Progress Note  Subjective:   Feels she is back at her baseline. Denies pain, numbness, weakness visual disturbance.   Objective Filed Vitals:   04/05/13 1357 04/05/13 2200 04/06/13 0200 04/06/13 0600  BP: 159/95 173/99 139/83 168/96  Pulse: 95 87 89 96  Temp: 98.1 F (36.7 C) 98 F (36.7 C) 98.2 F (36.8 C) 99.2 F (37.3 C)  TempSrc: Oral Oral Oral Oral  Resp: 20 20 20 20   Height:      Weight:      SpO2: 100% 100% 100% 99%   Physical Exam General: Alert, oriented, NAD Heart: Tachy regular Lungs: CTA bilaterally. No wheezes, rales, rhonchi noted Abdomen: Soft, NT, non-distended. Normal BS Extremities: No LE edema Dialysis Access: Large LUA AVF + bruit  Dialysis Orders: Center:[facility] MWF 4 hours Optiflux 180 2 K 2.25 Ca 400/800 EDW 53 left upper AVF heparin 5000;  no hectorol no Epo no IV Fe  Recent labs: hgb 12 4/30>11.8 5/7>11/4 5/14 > 9.2 5/21 (last Epo 11,200 4/30; tsat 51% 5/14)  iPTH 96 4/09   Assessment/Plan: 1. TIA - Neuro following - sx resolved; Carotid duplex this am with no significant stenosis  2. ESRD - MWF - continue usual orders. K+ 4.8  3. Hypertension/volume - SBPs 130s-170s. Managed with volume control and metoprolol 50 BID; BP is high pre HD and comes down during treatment; gets within 0.5 of EDW, which was raised 0.5 about 10 days ago because she was unable to tolerate decrease prior lowering of EDW in an attempt to control BP; CXR shows significant increase in cardiac silhouette since 08/2101 - Had 2 D Echo last evening to evaluate for pericardial effusion vs dilated CM (results pending) She has had some difficulty getting to current EDW;  Hydralazine TID started. Might be able to incr EDW with new BP med regimen. Eval HD today.  Echo shows normal LEVF, normal wll thickness, no pericardial effusion. 4. Anemia - Hgb drop (see trend under dialysis orders) most likely secondary to Epo deficit - Aranesp 60 with HD today,  FOBT pending;  recent Fe stores were excellent not warranting IV venofer. Follow CBC 5. Metabolic bone disease - iPTH within goal without vitamin D; continue binders tums ex 1 ac; P well controlled in outpt setting. Renal panel pending. 6. Nutrition - Albumin 3.0. Renal diet, multivitamin. Add Breeze. 7. HIV + - genotype pending - labs drawn 5/15. Now on dapsone and weekly azithromycin for OI prophylaxis per ID. Case mgmt consulted for op assistance with ARV 8. Fever ? Etiology - work up per ID; Tmax 99.2. Serum cryptococcal antigen negative. No LP indicated at this time per neuro/ ID.    Collene Leyden. Rhodia Albright [facility] Pager [phone] 04/06/2013,9:44 AM  LOS: 2 days  I have seen and examined this patient and agree with plan as outlined above with highlighted additions.  TIA sx resolved.  Hope that combination of incr BP med AND incr EDW will level off some of the big drops in BP we are seeing at HD.  ID now involved in management of HIV.  Michelina Mexicano B,MD 04/06/2013 11:40 AM   Additional Objective Labs: Basic Metabolic Panel:  Recent Labs Lab 04/04/13 1539 04/04/13 1551 04/06/13 0345  NA 133* 135 134*  K 4.1 4.2 4.8  CL 91* 95* 95*  CO2 32  --  26  GLUCOSE 89 85 83  BUN 21 22 42*  CREATININE 5.96* 5.10* 8.73*  CALCIUM 9.0  --  8.8  Liver Function Tests:  Recent Labs Lab 04/04/13 1539  AST 43*  ALT 19  ALKPHOS 121*  BILITOT 0.4  PROT 7.7  ALBUMIN 3.0*   No results found for this basename: LIPASE, AMYLASE,  in the last 168 hours CBC:  Recent Labs Lab 04/04/13 1539 04/04/13 1551  WBC 9.8  --   NEUTROABS 6.3  --   HGB 8.8* 9.2*  HCT 27.2* 27.0*  MCV 91.0  --   PLT 173  --    Blood Culture    Component Value Date/Time   SDES BLOOD RIGHT HAND 04/04/2013 1548   SPECREQUEST BOTTLES DRAWN AEROBIC AND ANAEROBIC 6CC 04/04/2013 1548   CULT NO GROWTH 2 DAYS 04/04/2013 1548   REPTSTATUS PENDING 04/04/2013 1548    Cardiac Enzymes:  Recent Labs Lab  04/04/13 1539  TROPONINI <0.30   CBG:  Recent Labs Lab 04/04/13 1611  GLUCAP 80   Studies/Results: Dg Chest 1 View  04/04/2013   *RADIOLOGY REPORT*  Clinical Data: Has aphasia.  Extremity weakness.  CHEST - 1 VIEW  Comparison: 09/11/2011  Findings: The heart is significantly enlarged, and marked change since prior study.  This raises a question of pericardial effusion or dilated cardiomyopathy.  There are no focal consolidations or pleural effusions.  No pulmonary edema.  IMPRESSION: Significant change in the appearance of the heart, raising question of new pericardial effusion or dilated cardiomyopathy.  Further evaluation with echocardiogram may be helpful.   Original Report Authenticated By: Nolon Nations, M.D.   Ct Head Wo Contrast  04/04/2013   *RADIOLOGY REPORT*  Clinical Data: Slurred speech, weakness, aphasia  CT HEAD WITHOUT CONTRAST  Technique:  Contiguous axial images were obtained from the base of the skull through the vertex without contrast.  Comparison: None.  Findings: No evidence of parenchymal hemorrhage or extra-axial fluid collection. No mass lesion, mass effect, or midline shift.  No CT evidence of acute infarction.  Cerebral volume is age appropriate.  No ventriculomegaly.  The visualized paranasal sinuses are essentially clear. The mastoid air cells are unopacified.  No evidence of calvarial fracture.  IMPRESSION: No evidence of acute intracranial abnormality.  These results were called by telephone on 04/04/2013 at 1605 hours to Dr Rolland Porter, who verbally acknowledged these results.   Original Report Authenticated By: Julian Hy, M.D.   Mr Brain Wo Contrast  04/05/2013   *RADIOLOGY REPORT*  Clinical Data:  Stroke.  HIV.  Hypertension.  End-stage renal disease.  MRI HEAD WITHOUT CONTRAST MRA HEAD WITHOUT CONTRAST  Technique:  Multiplanar, multiecho pulse sequences of the brain and surrounding structures were obtained without intravenous contrast. Angiographic images  of the head were obtained using MRA technique without contrast.  Comparison:  Head CT 05/24  MRI HEAD  Findings:  Diffusion imaging does not show any acute or subacute infarction.  There are mild chronic small vessel changes of the pons.  No cerebellar abnormality.  The cerebral hemispheres show scattered punctate foci of T2 and FLAIR signal within the white matter consistent with chronic small vessel disease.  No cortical or large vessel territory infarction.  No mass lesion, hemorrhage, hydrocephalus or extra-axial collection.  No pituitary mass.  No inflammatory sinus disease.  IMPRESSION: No acute finding.  Mild chronic small vessel changes throughout the brain.  MRA HEAD  Findings: Both internal carotid arteries are widely patent into the brain.  The anterior and middle cerebral vessels are patent without proximal stenosis, aneurysm or vascular malformation.  Both vertebral arteries are widely patent to  the basilar.  No basilar stenosis.  Posterior circulation branch vessels are normal.  IMPRESSION: Normal intracranial MR angiography of the large and medium-sized vessels.   Original Report Authenticated By: Nelson Chimes, M.D.   Mr Mra Head/brain Wo Cm  04/05/2013   *RADIOLOGY REPORT*  Clinical Data:  Stroke.  HIV.  Hypertension.  End-stage renal disease.  MRI HEAD WITHOUT CONTRAST MRA HEAD WITHOUT CONTRAST  Technique:  Multiplanar, multiecho pulse sequences of the brain and surrounding structures were obtained without intravenous contrast. Angiographic images of the head were obtained using MRA technique without contrast.  Comparison:  Head CT 05/24  MRI HEAD  Findings:  Diffusion imaging does not show any acute or subacute infarction.  There are mild chronic small vessel changes of the pons.  No cerebellar abnormality.  The cerebral hemispheres show scattered punctate foci of T2 and FLAIR signal within the white matter consistent with chronic small vessel disease.  No cortical or large vessel territory  infarction.  No mass lesion, hemorrhage, hydrocephalus or extra-axial collection.  No pituitary mass.  No inflammatory sinus disease.  IMPRESSION: No acute finding.  Mild chronic small vessel changes throughout the brain.  MRA HEAD  Findings: Both internal carotid arteries are widely patent into the brain.  The anterior and middle cerebral vessels are patent without proximal stenosis, aneurysm or vascular malformation.  Both vertebral arteries are widely patent to the basilar.  No basilar stenosis.  Posterior circulation branch vessels are normal.  IMPRESSION: Normal intracranial MR angiography of the large and medium-sized vessels.   Original Report Authenticated By: Nelson Chimes, M.D.   Medications:   . aspirin  325 mg Oral Daily  . azithromycin  1,200 mg Oral Weekly  . calcium carbonate  1 tablet Oral TID WC  . dapsone  100 mg Oral Daily  . darbepoetin (ARANESP) injection - DIALYSIS  60 mcg Intravenous Q Mon-HD  . Dolutegravir Sodium  50 mg Oral BID  . heparin  5,000 Units Subcutaneous Q8H  . hydrALAZINE  25 mg Oral Q8H  . lamiVUDine  25 mg Oral Daily  . metoprolol tartrate  50 mg Oral BID  . multivitamin  1 tablet Oral Q supper  . tenofovir  300 mg Oral Weekly

## 2013-04-06 NOTE — Evaluation (Signed)
Physical Therapy Evaluation Patient Details Name: TYNE MELOTT MRN: [id number] DOB: 08-01-1976 Today's Date: 04/06/2013 Time: [id number] PT Time Calculation (min): 21 min  PT Assessment / Plan / Recommendation Clinical Impression  pt admitted with sudden onset of dizziness and visual disturbance assoc. with L sided weakness and numbness that resolved in ED and has remained resolved    PT Assessment  Patent does not need any further PT services    Follow Up Recommendations  No PT follow up    Does the patient have the potential to tolerate intense rehabilitation      Barriers to Discharge        Equipment Recommendations  None recommended by PT    Recommendations for Other Services     Frequency      Precautions / Restrictions Precautions Precautions: None Restrictions Weight Bearing Restrictions: No   Pertinent Vitals/Pain       Mobility  Bed Mobility Bed Mobility: Supine to Sit;Sitting - Scoot to Edge of Bed Supine to Sit: 7: Independent Sitting - Scoot to [employer] of Bed: 7: Independent Transfers Transfers: Sit to Stand;Stand to Sit Sit to Stand: 7: Independent Stand to Sit: 7: Independent Details for Transfer Assistance: safe mobility Ambulation/Gait Ambulation/Gait Assistance: 7: Independent Ambulation Distance (Feet): 800 Feet Assistive device: None Gait Pattern: Within Functional Limits Gait velocity: community level ambulator Stairs: Yes Stair Management Technique: One rail Right;Alternating pattern;Forwards Number of Stairs: 6 Wheelchair Mobility Wheelchair Mobility: No    Exercises     PT Diagnosis:    PT Problem List:   PT Treatment Interventions:     PT Goals    Visit Information  Last PT Received On: 04/06/13 Assistance Needed: +1    Subjective Data  Subjective: I feel back to normal Patient Stated Goal: Independent   Prior Functioning  Home Living Lives With: Alone Type of Home: House Home Access: Level entry Home Layout: One  level Prior Function Level of Independence: Independent Able to Take Stairs?: Yes Driving: Yes Vocation: On disability Communication Communication: No difficulties Dominant Hand: Right    Cognition  Cognition Arousal/Alertness: Awake/alert Behavior During Therapy: WFL for tasks assessed/performed Overall Cognitive Status: Within Functional Limits for tasks assessed    Extremity/Trunk Assessment Right Lower Extremity Assessment RLE ROM/Strength/Tone: Within functional levels (bil weakness hip flexors and lower trunk) Left Lower Extremity Assessment LLE ROM/Strength/Tone: Within functional levels   Balance Balance Balance Assessed: Yes High Level Balance High Level Balance Activites: Side stepping;Braiding;Backward walking;Direction changes;Turns;Sudden stops;Head turns;Other (comment) (picking up objects on the fly) High Level Balance Comments: no LOB or deviation in execution of any balance challenges  End of Session PT - End of Session Activity Tolerance: Patient tolerated treatment well Patient left: in bed;Other (comment) (sitting EOB) Nurse Communication: Mobility status  GP     Jenie Parish, Tessie Fass 04/06/2013, 11:06 AM  04/06/2013  Donnella Sham, [place] [phone]  (pager)

## 2013-04-06 NOTE — Progress Notes (Signed)
Occupational Therapy Evaluation Patient Details Name: Tiffany Golden MRN: [id number] DOB: 05-10-1976 Today's Date: 04/06/2013 Time: [id number] OT Time Calculation (min): 16 min  OT Assessment / Plan / Recommendation Clinical Impression  Pt is a 37 y/o Female admitted with sudden onset of dizziness and visual disturbance assoc. with L sided weakness and numbness that resolved in ED and has remained resolved. Pt operating at an independent level    OT Assessment  Patient does not need any further OT services    Follow Up Recommendations  No OT follow up       Equipment Recommendations  None recommended by OT          Precautions / Restrictions Precautions Precautions: None Restrictions Weight Bearing Restrictions: No   Pertinent Vitals/Pain No pain reported by Pt    ADL  Lower Body Dressing: Performed;Independent Where Assessed - Lower Body Dressing: Unsupported sit to stand;Unsupported sitting Toilet Transfer: Simulated;Independent Toilet Transfer Method: Sit to Loss adjuster, chartered: Regular height toilet Tub/Shower Transfer: Performed;Independent Tub/Shower Transfer Method: Stand pivot Equipment Used: Gait belt Transfers/Ambulation Related to ADLs: Pt is indpendent for transfer and ambulation ADL Comments: Pt performing at Indpendent level for ADL      OT Goals    Visit Information  Last OT Received On: 04/06/13 Assistance Needed: +1    Subjective Data  Subjective: Pt is really friendly, and has hot pink nails Patient Stated Goal: none stated   Prior Functioning     Home Living Lives With: Alone Available Help at Discharge: Friend(s);Family;Available PRN/intermittently Type of Home: House Home Access: Level entry Home Layout: One level Bathroom Shower/Tub: Tub/shower unit;Curtain Biochemist, clinical: Standard Home Adaptive Equipment: None Prior Function Level of Independence: Independent Able to Take Stairs?: Yes Driving: Yes Vocation: On  disability Communication Communication: No difficulties Dominant Hand: Right         Vision/Perception Vision - History Baseline Vision: Wears glasses all the time Vision - Assessment Eye Alignment: Within Functional Limits Vision Assessment: Vision tested Ocular Range of Motion: Within Functional Limits Tracking/Visual Pursuits: Able to track stimulus in all quads without difficulty Saccades: Within functional limits Convergence: Within functional limits Visual Fields: No apparent deficits Additional Comments: Pt bumped into door and she said that she was pushing it open. Pt passed visual tests [facility]   Cognition  Cognition Arousal/Alertness: Awake/alert Behavior During Therapy: WFL for tasks assessed/performed Overall Cognitive Status: Within Functional Limits for tasks assessed    Extremity/Trunk Assessment Right Upper Extremity Assessment RUE ROM/Strength/Tone: Within functional levels (Pt held >90 degrees for longer than 30 sec with no drift) Left Upper Extremity Assessment LUE ROM/Strength/Tone: Within functional levels Right Lower Extremity Assessment RLE ROM/Strength/Tone: Within functional levels (bil weakness hip flexors and lower trunk) Left Lower Extremity Assessment LLE ROM/Strength/Tone: Within functional levels     Mobility Bed Mobility Bed Mobility: Supine to Sit;Sitting - Scoot to Edge of Bed Supine to Sit: 7: Independent Sitting - Scoot to [employer] of Bed: 7: Independent Transfers Transfers: Sit to Stand;Stand to Sit Sit to Stand: 7: Independent Stand to Sit: 7: Independent Details for Transfer Assistance: safe mobility        Balance Balance Balance Assessed: Yes Static Sitting Balance Static Sitting - Balance Support: No upper extremity supported Static Sitting - Level of Assistance: 7: Independent Static Standing Balance Static Standing - Balance Support: No upper extremity supported Static Standing - Level of Assistance: 7: Independent Dynamic  Standing Balance Dynamic Standing - Balance Support: No upper extremity supported Dynamic Standing -  Level of Assistance: 7: Independent Dynamic Standing - Balance Activities: Lateral lean/weight shifting;Forward lean/weight shifting High Level Balance High Level Balance Activites: Turns High Level Balance Comments:  independent   End of Session OT - End of Session Equipment Utilized During Treatment: Gait belt Activity Tolerance: Patient tolerated treatment well Patient left: in bed;with call bell/phone within reach Nurse Communication: Mobility status  GO     Hulda Humphrey 04/06/2013, 2:10 PM

## 2013-04-06 NOTE — Progress Notes (Signed)
Bilateral carotid artery duplex:  No evidence of hemodynamically significant internal carotid artery stenosis.   Vertebral artery flow is antegrade.

## 2013-04-07 ENCOUNTER — Ambulatory Visit (HOSPITAL_COMMUNITY): Payer: 59

## 2013-04-07 DIAGNOSIS — A0472 Enterocolitis due to Clostridium difficile, not specified as recurrent: Secondary | ICD-10-CM

## 2013-04-07 LAB — CLOSTRIDIUM DIFFICILE BY PCR: Toxigenic C. Difficile by PCR: POSITIVE — AB

## 2013-04-07 MED ORDER — METOPROLOL TARTRATE 25 MG PO TABS
25.0000 mg | ORAL_TABLET | ORAL | Status: DC
  Filled 2013-04-07: qty 1

## 2013-04-07 MED ORDER — RENA-VITE PO TABS: 1.0000 | ORAL_TABLET | Freq: Every day | ORAL | Status: DC

## 2013-04-07 MED ORDER — METRONIDAZOLE 500 MG PO TABS
500.0000 mg | ORAL_TABLET | Freq: Three times a day (TID) | ORAL | Status: DC
  Administered 2013-04-07 – 2013-04-08 (×4): 500 mg via ORAL
  Filled 2013-04-07 (×6): qty 1

## 2013-04-07 MED ORDER — METOPROLOL TARTRATE 25 MG PO TABS
25.0000 mg | ORAL_TABLET | Freq: Every day | ORAL | Status: DC
  Administered 2013-04-08: 25 mg via ORAL
  Filled 2013-04-07: qty 1

## 2013-04-07 NOTE — Progress Notes (Signed)
Call and lab: positive C-diff PCR, Dr. Jerilee Hoh notified

## 2013-04-07 NOTE — Progress Notes (Signed)
[facility] PROGRESS NOTE  Tiffany Golden [id number] DOB: [birth year] DOA: 04/04/2013 PCP: Louis Meckel, MD  Assessment/Plan: TIA -Symptoms resolved. -MRI/ECHO/carotid dopplers WNL. -PT recommends no further follow up.  HIV/AIDS -CD4 60. -Has follow up at [facility] for ARTs. -Dapsone/azithro for prophylaxis.  ESRD -On HD M-W-F. -For HD later today.  Fever -C Diff PCR is positive. -Per ID, since neurologic symptoms resolved, can defer LP.  No significant extracranial carotid artery stenosis demonstrated. Vertebrals are patent with antegrade flow.  C. Diff Colitis -Has been started on PO flagyl.  Abnormal CXR -Concerning for pericardial effusion vs dilated CM. -ECHO: Left ventricle: The cavity size was normal. Wall thickness was normal. Systolic function was vigorous. The estimated ejection fraction was in the range of 65% to 70%. Wall motion was normal; there were no regional wall motion abnormalities. No evidence for pericardial effusion,   Code Status: Full Family Communication: None today  Disposition Plan: Home in 24-48 hours once afebrile.   Consultants:  ID, Dr. Johnnye Sima  Nephrology, Dr. Lorrene Reid   Antibiotics:  Azithro/dapsone for prophylaxis   Flagyl day 1  Subjective: No complaints. No BMs this am.  Objective: Filed Vitals:   04/06/13 2234 04/07/13 0200 04/07/13 0600 04/07/13 1000  BP: 129/90 146/91 133/77 126/66  Pulse: 122 98 90 69  Temp: 103 F (39.4 C) 97.7 F (36.5 C) 97.6 F (36.4 C) 98 F (36.7 C)  TempSrc: Oral Oral Oral Oral  Resp: 22 20 20 18   Height:      Weight:      SpO2: 99% 98% 98% 97%    Intake/Output Summary (Last 24 hours) at 04/07/13 1403 Last data filed at 04/06/13 2140  Gross per 24 hour  Intake      0 ml  Output   2000 ml  Net  -2000 ml   Filed Weights   04/04/13 1508 04/06/13 1720 04/06/13 2140  Weight: 54.432 kg (120 lb) 56 kg (123 lb 7.3 oz) 55.9 kg (123 lb 3.8 oz)    Exam:   General:   AA Ox3, NAD  Cardiovascular: RRR, no M/R/G  Respiratory: CTA B  Abdomen: S/NT/ND/+BS, no masses or organomegaly noted.  Extremities: no C/C/E, +pedal pulses  Neurologic:  Grossly intact and non-focal.  Data Reviewed: Basic Metabolic Panel:  Recent Labs Lab 04/04/13 1539 04/04/13 1551 04/06/13 0345 04/06/13 1710  NA 133* 135 134* 133*  K 4.1 4.2 4.8 4.9  CL 91* 95* 95* 94*  CO2 32  --  26 23  GLUCOSE 89 85 83 100*  BUN 21 22 42* 51*  CREATININE 5.96* 5.10* 8.73* 9.87*  CALCIUM 9.0  --  8.8 8.1*  PHOS  --   --   --  5.2*   Liver Function Tests:  Recent Labs Lab 04/04/13 1539 04/06/13 1710  AST 43*  --   ALT 19  --   ALKPHOS 121*  --   BILITOT 0.4  --   PROT 7.7  --   ALBUMIN 3.0* 2.4*   No results found for this basename: LIPASE, AMYLASE,  in the last 168 hours No results found for this basename: AMMONIA,  in the last 168 hours CBC:  Recent Labs Lab 04/04/13 1539 04/04/13 1551 04/06/13 1710  WBC 9.8  --  7.5  NEUTROABS 6.3  --   --   HGB 8.8* 9.2* 7.6*  HCT 27.2* 27.0* 22.6*  MCV 91.0  --  88.3  PLT 173  --  193   Cardiac Enzymes:  Recent  Labs Lab 04/04/13 1539  TROPONINI <0.30   BNP (last 3 results) No results found for this basename: PROBNP,  in the last 8760 hours CBG:  Recent Labs Lab 04/04/13 1611  GLUCAP 80    Recent Results (from the past 240 hour(s))  CULTURE, BLOOD (ROUTINE X 2)     Status: None   Collection Time    04/04/13  3:43 PM      Result Value Range Status   Specimen Description RIGHT ANTECUBITAL   Final   Special Requests BOTTLES DRAWN AEROBIC AND ANAEROBIC 6CC   Final   Culture NO GROWTH 3 DAYS   Final   Report Status PENDING   Incomplete  CULTURE, BLOOD (ROUTINE X 2)     Status: None   Collection Time    04/04/13  3:48 PM      Result Value Range Status   Specimen Description BLOOD RIGHT HAND   Final   Special Requests BOTTLES DRAWN AEROBIC AND ANAEROBIC 6CC   Final   Culture NO GROWTH 3 DAYS   Final    Report Status PENDING   Incomplete  CLOSTRIDIUM DIFFICILE BY PCR     Status: Abnormal   Collection Time    04/07/13 12:06 AM      Result Value Range Status   C difficile by pcr POSITIVE (*) NEGATIVE Final   Comment: CRITICAL RESULT CALLED TO, READ BACK BY AND VERIFIED WITH:     Earnstine Regal RN 9:50 04/07/13 ([email or web address])     Studies: No results found.  Scheduled Meds: . aspirin  325 mg Oral Daily  . azithromycin  1,200 mg Oral Weekly  . calcium carbonate  1 tablet Oral TID WC  . dapsone  100 mg Oral Daily  . darbepoetin (ARANESP) injection - DIALYSIS  60 mcg Intravenous Q Mon-HD  . Dolutegravir Sodium  50 mg Oral BID  . feeding supplement  1 Container Oral BID BM  . heparin  5,000 Units Subcutaneous Q8H  . hydrALAZINE  25 mg Oral Q8H  . lamiVUDine  25 mg Oral Daily  . metoprolol tartrate  50 mg Oral BID  . metroNIDAZOLE  500 mg Oral Q8H  . multivitamin  1 tablet Oral Q supper  . tenofovir  300 mg Oral Weekly   Continuous Infusions:   Principal Problem:   TIA (transient ischemic attack) Active Problems:   Hypertension   Fever   ESRD on hemodialysis   AIDS   Enteritis due to Clostridium difficile    Time spent: 35 minutes.    Lelon Frohlich  [facility] Pager [phone]  If 7PM-7AM, please contact night-coverage at [email or web address], password [facility] 04/07/2013, 2:03 PM  LOS: 3 days

## 2013-04-07 NOTE — Progress Notes (Signed)
INFECTIOUS DISEASE PROGRESS NOTE  ID: Tiffany Golden is a 37 y.o. female with  Principal Problem:   TIA (transient ischemic attack) Active Problems:   Hypertension   Fever   ESRD on hemodialysis   AIDS  Subjective: Without complaints, no loose BM today.   Abtx:  Anti-infectives   Start     Dose/Rate Route Frequency Ordered Stop   04/07/13 1400  metroNIDAZOLE (FLAGYL) tablet 500 mg     500 mg Oral 3 times per day 04/07/13 1022 04/21/13 1359   04/06/13 1700  azithromycin (ZITHROMAX) tablet 1,200 mg     1,200 mg Oral Weekly 04/05/13 1346     04/06/13 1000  lamiVUDine (EPIVIR) 10 MG/ML solution 25 mg     25 mg Oral Daily 04/05/13 1344     04/05/13 2200  Dolutegravir Sodium TABS 50 mg     50 mg Oral 2 times daily 04/05/13 1424     04/05/13 1700  dapsone tablet 100 mg     100 mg Oral Daily 04/05/13 1345     04/05/13 1500  tenofovir (VIREAD) tablet 300 mg     300 mg Oral Weekly 04/05/13 1344     04/05/13 1500  lamiVUDine (EPIVIR) 10 MG/ML solution 50 mg     50 mg Oral  Once 04/05/13 1344 04/05/13 1501      Medications:  Scheduled: . aspirin  325 mg Oral Daily  . azithromycin  1,200 mg Oral Weekly  . calcium carbonate  1 tablet Oral TID WC  . dapsone  100 mg Oral Daily  . darbepoetin (ARANESP) injection - DIALYSIS  60 mcg Intravenous Q Mon-HD  . Dolutegravir Sodium  50 mg Oral BID  . feeding supplement  1 Container Oral BID BM  . heparin  5,000 Units Subcutaneous Q8H  . hydrALAZINE  25 mg Oral Q8H  . lamiVUDine  25 mg Oral Daily  . metoprolol tartrate  50 mg Oral BID  . metroNIDAZOLE  500 mg Oral Q8H  . multivitamin  1 tablet Oral Q supper  . tenofovir  300 mg Oral Weekly    Objective: Vital signs in last 24 hours: Temp:  [97.6 F (36.4 C)-103 F (39.4 C)] 97.6 F (36.4 C) (05/27 0600) Pulse Rate:  [90-133] 90 (05/27 0600) Resp:  [14-28] 20 (05/27 0600) BP: (129-169)/(39-102) 133/77 mmHg (05/27 0600) SpO2:  [93 %-99 %] 98 % (05/27 0600) Weight:  [55.9 kg (123  lb 3.8 oz)-56 kg (123 lb 7.3 oz)] 55.9 kg (123 lb 3.8 oz) (05/26 2140)   General appearance: alert, cooperative and no distress Resp: clear to auscultation bilaterally Cardio: regular rate and rhythm GI: normal findings: bowel sounds normal and soft, non-tender LUE- AVG + bruit  Lab Results  Recent Labs  04/04/13 1539 04/04/13 1551 04/06/13 0345 04/06/13 1710  WBC 9.8  --   --  7.5  HGB 8.8* 9.2*  --  7.6*  HCT 27.2* 27.0*  --  22.6*  NA 133* 135 134* 133*  K 4.1 4.2 4.8 4.9  CL 91* 95* 95* 94*  CO2 32  --  26 23  BUN 21 22 42* 51*  CREATININE 5.96* 5.10* 8.73* 9.87*   Liver Panel  Recent Labs  04/04/13 1539 04/06/13 1710  PROT 7.7  --   ALBUMIN 3.0* 2.4*  AST 43*  --   ALT 19  --   ALKPHOS 121*  --   BILITOT 0.4  --    Sedimentation Rate No results found for  this basename: ESRSEDRATE,  in the last 72 hours C-Reactive Protein No results found for this basename: CRP,  in the last 72 hours  Microbiology: Recent Results (from the past 240 hour(s))  CULTURE, BLOOD (ROUTINE X 2)     Status: None   Collection Time    04/04/13  3:43 PM      Result Value Range Status   Specimen Description RIGHT ANTECUBITAL   Final   Special Requests BOTTLES DRAWN AEROBIC AND ANAEROBIC 6CC   Final   Culture NO GROWTH 3 DAYS   Final   Report Status PENDING   Incomplete  CULTURE, BLOOD (ROUTINE X 2)     Status: None   Collection Time    04/04/13  3:48 PM      Result Value Range Status   Specimen Description BLOOD RIGHT HAND   Final   Special Requests BOTTLES DRAWN AEROBIC AND ANAEROBIC 6CC   Final   Culture NO GROWTH 3 DAYS   Final   Report Status PENDING   Incomplete  CLOSTRIDIUM DIFFICILE BY PCR     Status: Abnormal   Collection Time    04/07/13 12:06 AM      Result Value Range Status   C difficile by pcr POSITIVE (*) NEGATIVE Final   Comment: CRITICAL RESULT CALLED TO, READ BACK BY AND VERIFIED WITH:     Earnstine Regal RN 9:50 04/07/13 ([email or web address])    Studies/Results: Mr  Brain Wo Contrast  04/05/2013   *RADIOLOGY REPORT*  Clinical Data:  Stroke.  HIV.  Hypertension.  End-stage renal disease.  MRI HEAD WITHOUT CONTRAST MRA HEAD WITHOUT CONTRAST  Technique:  Multiplanar, multiecho pulse sequences of the brain and surrounding structures were obtained without intravenous contrast. Angiographic images of the head were obtained using MRA technique without contrast.  Comparison:  Head CT 05/24  MRI HEAD  Findings:  Diffusion imaging does not show any acute or subacute infarction.  There are mild chronic small vessel changes of the pons.  No cerebellar abnormality.  The cerebral hemispheres show scattered punctate foci of T2 and FLAIR signal within the white matter consistent with chronic small vessel disease.  No cortical or large vessel territory infarction.  No mass lesion, hemorrhage, hydrocephalus or extra-axial collection.  No pituitary mass.  No inflammatory sinus disease.  IMPRESSION: No acute finding.  Mild chronic small vessel changes throughout the brain.  MRA HEAD  Findings: Both internal carotid arteries are widely patent into the brain.  The anterior and middle cerebral vessels are patent without proximal stenosis, aneurysm or vascular malformation.  Both vertebral arteries are widely patent to the basilar.  No basilar stenosis.  Posterior circulation branch vessels are normal.  IMPRESSION: Normal intracranial MR angiography of the large and medium-sized vessels.   Original Report Authenticated By: Nelson Chimes, M.D.   Mr Mra Head/brain Wo Cm  04/05/2013   *RADIOLOGY REPORT*  Clinical Data:  Stroke.  HIV.  Hypertension.  End-stage renal disease.  MRI HEAD WITHOUT CONTRAST MRA HEAD WITHOUT CONTRAST  Technique:  Multiplanar, multiecho pulse sequences of the brain and surrounding structures were obtained without intravenous contrast. Angiographic images of the head were obtained using MRA technique without contrast.  Comparison:  Head CT 05/24  MRI HEAD  Findings:  Diffusion  imaging does not show any acute or subacute infarction.  There are mild chronic small vessel changes of the pons.  No cerebellar abnormality.  The cerebral hemispheres show scattered punctate foci of T2 and FLAIR signal within the white  matter consistent with chronic small vessel disease.  No cortical or large vessel territory infarction.  No mass lesion, hemorrhage, hydrocephalus or extra-axial collection.  No pituitary mass.  No inflammatory sinus disease.  IMPRESSION: No acute finding.  Mild chronic small vessel changes throughout the brain.  MRA HEAD  Findings: Both internal carotid arteries are widely patent into the brain.  The anterior and middle cerebral vessels are patent without proximal stenosis, aneurysm or vascular malformation.  Both vertebral arteries are widely patent to the basilar.  No basilar stenosis.  Posterior circulation branch vessels are normal.  IMPRESSION: Normal intracranial MR angiography of the large and medium-sized vessels.   Original Report Authenticated By: Nelson Chimes, M.D.     Assessment/Plan: AIDS ESRD Fever- now back with C diff+ Total days of antibiotics: 1 (flagyl) Day 3 ART Have called clinic to try and arrange her emergency ADAP.  Hopefully home in AM if continues to be afebrile.          Bobby Rumpf Infectious Diseases [email or web address] 04/07/2013, 10:35 AM   LOS: 3 days

## 2013-04-07 NOTE — Progress Notes (Signed)
Subjective:  Feels well HD yesterday without incident Temp to 103 with loose stools on HD CDiff+ now on po flagyl  Objective:    Vital signs in last 24 hours: Filed Vitals:   04/06/13 2205 04/06/13 2234 04/07/13 0200 04/07/13 0600  BP: 145/49 129/90 146/91 133/77  Pulse: 128 122 98 90  Temp:  103 F (39.4 C) 97.7 F (36.5 C) 97.6 F (36.4 C)  TempSrc:  Oral Oral Oral  Resp: 28 22 20 20   Height:      Weight:      SpO2:  99% 98% 98%   Weight change:   Intake/Output Summary (Last 24 hours) at 04/07/13 1108 Last data filed at 04/06/13 2140  Gross per 24 hour  Intake      0 ml  Output   2000 ml  Net  -2000 ml    Physical Exam:  Blood pressure 133/77, pulse 90, temperature 97.6 F (36.4 C), temperature source Oral, resp. rate 20, height 5\' 4"  (1.626 m), weight 55.9 kg (123 lb 3.8 oz), last menstrual period 04/04/2013, SpO2 98.00%. Physical Exam  General: Alert, oriented, NAD  Heart: Tachy regular  Lungs: CTA bilaterally. No wheezes, rales, rhonchi noted  Abdomen: Soft, NT, non-distended. Normal BS  Extremities: No LE edema  Dialysis Access: Large LUA AVF + bruit   Recent Labs Lab 04/04/13 1539 04/04/13 1551 04/06/13 0345 04/06/13 1710  NA 133* 135 134* 133*  K 4.1 4.2 4.8 4.9  CL 91* 95* 95* 94*  CO2 32  --  26 23  GLUCOSE 89 85 83 100*  BUN 21 22 42* 51*  CREATININE 5.96* 5.10* 8.73* 9.87*  CALCIUM 9.0  --  8.8 8.1*  PHOS  --   --   --  5.2*    Recent Labs Lab 04/04/13 1539 04/06/13 1710  AST 43*  --   ALT 19  --   ALKPHOS 121*  --   BILITOT 0.4  --   PROT 7.7  --   ALBUMIN 3.0* 2.4*   Recent Labs Lab 04/04/13 1539 04/04/13 1551 04/06/13 1710  WBC 9.8  --  7.5  NEUTROABS 6.3  --   --   HGB 8.8* 9.2* 7.6*  HCT 27.2* 27.0* 22.6*  MCV 91.0  --  88.3  PLT 173  --  193   Recent Labs Lab 04/04/13 1539  TROPONINI <0.30    Studies/Results: No results found.    Marland Kitchen aspirin  325 mg Oral Daily  . azithromycin  1,200 mg Oral Weekly  .  calcium carbonate  1 tablet Oral TID WC  . dapsone  100 mg Oral Daily  . darbepoetin (ARANESP) injection - DIALYSIS  60 mcg Intravenous Q Mon-HD  . Dolutegravir Sodium  50 mg Oral BID  . feeding supplement  1 Container Oral BID BM  . heparin  5,000 Units Subcutaneous Q8H  . hydrALAZINE  25 mg Oral Q8H  . lamiVUDine  25 mg Oral Daily  . metoprolol tartrate  50 mg Oral BID  . metroNIDAZOLE  500 mg Oral Q8H  . multivitamin  1 tablet Oral Q supper  . tenofovir  300 mg Oral Weekly   Dialysis Orders: Center:[facility] MWF 4 hours Optiflux 180 2 K 2.25 Ca 400/800 EDW 53 left upper AVF heparin 5000;  no hectorol no Epo no IV Fe   ASSESSMENT/RECOMMENDATIONS 1. TIA resolved. Carotid duplex neg  2. ESRD MWF [facility] Post weight yesterday not different from pre despite 2 liters UF off EDW is  83 Post weight was 55.9 (56 pre) so doubt correct Use 54 as new EDW  3. HTN Metoprolol and hydralazine  4. Anemia Hb drop top 7's Aranesp 60 given 5/26 (change to EPO at D/C) Fe stores OK  5. CKD-MBD Continue binders  6. HIV Now on meds Dr. Johnnye Sima to arrange followup  7. C Diff +  On po flagyl  8. Fevers Neg cultures, crypto Ag Maybe d/t Cdiff  9. Dispo Poss d/c after HD 5/28  Tiffany Maes, MD [facility] [phone] Pager 04/07/2013, 11:08 AM

## 2013-04-07 NOTE — Evaluation (Signed)
Speech Language Pathology Evaluation Patient Details Name: Tiffany Golden MRN: [id number] DOB: Jan 06, 1976 Today's Date: 04/07/2013 Time: [id number] SLP Time Calculation (min): 18 min  Problem List:  Patient Active Problem List   Diagnosis Date Noted  . TIA (transient ischemic attack) 04/05/2013  . Fever 04/05/2013  . ESRD on hemodialysis 04/05/2013  . AIDS 04/05/2013  . HIV (human immunodeficiency virus infection) 09/06/2011  . ESRD (end stage renal disease) 09/06/2011  . Hypertension 09/06/2011   Past Medical History:  Past Medical History  Diagnosis Date  . Hypertension   . HIV infection   . Chronic kidney disease   . Anemia   . Dialysis patient     mon, wed friday   Past Surgical History:  Past Surgical History  Procedure Laterality Date  . Cesarean section    . Arteriovenous graft placement  09/11/11    left arm   HPI:  37 year old female with history of HIV/AIDS (last CD4 of 60 not on any ART for past 11 years due to financial issues), hypertension, end-stage renal disease on dialysis since 2012 presented to [facility] ED on 5/24 with acute onset of blurred vision followed by numbness in her left leg and arm that lasted for about 2 hours. She denied any weakness of the extremity. She then had some slurred speech. In the ED she was found to have a temperature of 101.9. Patient denies any headache, dizziness, chills, cough, URI symptoms, dysphagia, oral thrush, neck swellings, chest pain, palpitations, shortness of breath, hemoptysis, nausea, vomiting, abdominal pain, diarrhea, joint pains or weight loss. She denies any sick contacts. She denies any history of opportunistic infections. Diagnosed with TIA.    Assessment / Plan / Recommendation Clinical Impression  Cognitive-linguistic evaluation complete. Cognitive-linguistic skills WFL at this time. No further SLP needs indicated. Signing off.     SLP Assessment  Patient does not need any further Speech Lanaguage Pathology  Services    Follow Up Recommendations  None    Frequency and Duration        Pertinent Vitals/Pain None reported    SLP Evaluation Prior Functioning  Cognitive/Linguistic Baseline: Within functional limits Type of Home: House Lives With: Alone Available Help at Discharge: Friend(s);Family;Available PRN/intermittently Vocation: On disability   Cognition  Overall Cognitive Status: Within Functional Limits for tasks assessed    Comprehension  Auditory Comprehension Overall Auditory Comprehension: Appears within functional limits for tasks assessed Visual Recognition/Discrimination Discrimination: Within Function Limits Reading Comprehension Reading Status: Within funtional limits    Expression Expression Primary Mode of Expression: Verbal Verbal Expression Overall Verbal Expression: Appears within functional limits for tasks assessed Written Expression Dominant Hand: Right Written Expression: Not tested   Oral / Motor Oral Motor/Sensory Function Overall Oral Motor/Sensory Function: Appears within functional limits for tasks assessed Motor Speech Overall Motor Speech: Appears within functional limits for tasks assessed   GO   Gabriel Rainwater MA, CCC-SLP [phone]   Adilson Grafton Tiffany Golden 04/07/2013, 9:34 AM

## 2013-04-08 LAB — CBC
HCT: 23.2 % — ABNORMAL LOW (ref 36.0–46.0)
MCH: 29.2 pg (ref 26.0–34.0)
MCHC: 32.8 g/dL (ref 30.0–36.0)
MCV: 89.2 fL (ref 78.0–100.0)
RDW: 16.7 % — ABNORMAL HIGH (ref 11.5–15.5)

## 2013-04-08 LAB — RENAL FUNCTION PANEL
Albumin: 2.5 g/dL — ABNORMAL LOW (ref 3.5–5.2)
BUN: 25 mg/dL — ABNORMAL HIGH (ref 6–23)
Calcium: 8.8 mg/dL (ref 8.4–10.5)
Creatinine, Ser: 7.14 mg/dL — ABNORMAL HIGH (ref 0.50–1.10)
Glucose, Bld: 87 mg/dL (ref 70–99)
Phosphorus: 3.6 mg/dL (ref 2.3–4.6)

## 2013-04-08 LAB — CMV ANTIBODY, IGG (EIA): CMV Ab - IgG: >10 U/mL — ABNORMAL HIGH (ref <0.60)

## 2013-04-08 MED ORDER — HYDRALAZINE HCL 25 MG PO TABS: 25.0000 mg | ORAL_TABLET | Freq: Three times a day (TID) | ORAL | Status: DC

## 2013-04-08 MED ORDER — LAMIVUDINE 10 MG/ML PO SOLN: 25.0000 mg | Freq: Every day | ORAL | Status: DC

## 2013-04-08 MED ORDER — DAPSONE 100 MG PO TABS: 100.0000 mg | ORAL_TABLET | Freq: Every day | ORAL | Status: DC

## 2013-04-08 MED ORDER — AZITHROMYCIN 600 MG PO TABS: 1200.0000 mg | ORAL_TABLET | ORAL | Status: DC

## 2013-04-08 MED ORDER — METRONIDAZOLE 500 MG PO TABS: 500.0000 mg | ORAL_TABLET | Freq: Three times a day (TID) | ORAL | Status: DC

## 2013-04-08 MED ORDER — TENOFOVIR DISOPROXIL FUMARATE 300 MG PO TABS: 300.0000 mg | ORAL_TABLET | ORAL | Status: DC

## 2013-04-08 MED ORDER — DOLUTEGRAVIR SODIUM 50 MG PO TABS: 50.0000 mg | ORAL_TABLET | Freq: Two times a day (BID) | ORAL | Status: DC

## 2013-04-08 NOTE — Progress Notes (Signed)
Subjective:  Feels well and remains afebrile No more TIA sx and no more fevers On po flagyl for CDiff Currently receiving hemo Objective:    Vital signs in last 24 hours: Filed Vitals:   04/08/13 0602 04/08/13 0610 04/08/13 0644 04/08/13 0700  BP: 158/94 152/103 143/98 142/93  Pulse: 100 93 84 81  Temp: 97.8 F (36.6 C) 97.6 F (36.4 C)    TempSrc: Oral Oral    Resp: 17 18 25 23   Height:      Weight:  53.5 kg (117 lb 15.1 oz)    SpO2: 96%  99%    Weight change: -2.5 kg (-5 lb 8.2 oz) No intake or output data in the 24 hours ending 04/08/13 0738  Physical Exam:  Blood pressure 142/93, pulse 81, temperature 97.6 F (36.4 C), temperature source Oral, resp. rate 23, height 5\' 4"  (1.626 m), weight 53.5 kg (117 lb 15.1 oz), last menstrual period 04/04/2013, SpO2 99.00%. Physical Exam  General: Alert, oriented, NAD  Heart: S1S2 No S3 Lungs: CTA bilaterally Abdomen: Soft, NT, non-distended. Normal BS  Extremities: No LE edema  Dialysis Access: Large LUA AVF + bruit/cannulated with small ooze from one needle site Pre dialysis weight: 53.5 kg  Recent Labs Lab 04/04/13 1539 04/04/13 1551 04/06/13 0345 04/06/13 1710  NA 133* 135 134* 133*  K 4.1 4.2 4.8 4.9  CL 91* 95* 95* 94*  CO2 32  --  26 23  GLUCOSE 89 85 83 100*  BUN 21 22 42* 51*  CREATININE 5.96* 5.10* 8.73* 9.87*  CALCIUM 9.0  --  8.8 8.1*  PHOS  --   --   --  5.2*    Recent Labs Lab 04/04/13 1539 04/06/13 1710  AST 43*  --   ALT 19  --   ALKPHOS 121*  --   BILITOT 0.4  --   PROT 7.7  --   ALBUMIN 3.0* 2.4*    Recent Labs Lab 04/04/13 1539 04/04/13 1551 04/06/13 1710 04/08/13 0658  WBC 9.8  --  7.5 6.7  NEUTROABS 6.3  --   --   --   HGB 8.8* 9.2* 7.6* 7.6*  HCT 27.2* 27.0* 22.6* 23.2*  MCV 91.0  --  88.3 89.2  PLT 173  --  193 191    Recent Labs Lab 04/04/13 1539  TROPONINI <0.30    Studies/Results: No results found.    Marland Kitchen aspirin  325 mg Oral Daily  . azithromycin  1,200 mg Oral  Weekly  . calcium carbonate  1 tablet Oral TID WC  . dapsone  100 mg Oral Daily  . darbepoetin (ARANESP) injection - DIALYSIS  60 mcg Intravenous Q Mon-HD  . Dolutegravir Sodium  50 mg Oral BID  . feeding supplement  1 Container Oral BID BM  . heparin  5,000 Units Subcutaneous Q8H  . hydrALAZINE  25 mg Oral Q8H  . lamiVUDine  25 mg Oral Daily  . metoprolol tartrate  25 mg Oral Daily  . metoprolol tartrate  25 mg Oral Custom  . metroNIDAZOLE  500 mg Oral Q8H  . multivitamin  1 tablet Oral Q supper  . tenofovir  300 mg Oral Weekly   Dialysis Orders: Center:[facility] MWF 4 hours Optiflux 180 2 K 2.25 Ca 400/800 EDW 53 left upper AVF heparin 5000;  no hectorol no Epo no IV Fe   ASSESSMENT/RECOMMENDATIONS 1. TIA resolved. Carotid duplex neg/MRI and echo neg/ no followupup needed  2. ESRD MWF [facility] EDW is 53 Pre  weight today 53.5 Use 54 as new EDW (to allow for clothing)  3. HTN Metoprolol and hydralazine now, with slightly higher EDW to attenuate BP swings on HD  4. Anemia Hb drop top 7's Aranesp 60 given 5/26 (will change to EPO at D/C) Fe stores OK  5. CKD-MBD Continue binders  6. HIV/AIDS CD4 60 Now on meds Dr. Johnnye Sima to arrange followup (says actually has ID clinic appt tomorrow)  7. C Diff +  On po flagyl  8. Fevers Neg cultures, crypto Ag- Maybe d/t Cdiff  9. Dispo Poss d/c after HD 5/28 (today) Will notify her HD unit Only dialysis changes will be reinitiation of EPO for low HB and EDW change and will notify them of BP med changes  Jamal Maes, MD [place] Pager 04/08/2013, 7:38 AM

## 2013-04-08 NOTE — Discharge Summary (Signed)
Physician Discharge Summary  Tiffany Golden [id number] DOB: Dec 10, 1975 DOA: 04/04/2013  PCP: Louis Meckel, MD  Admit date: 04/04/2013 Discharge date: 04/08/2013  Time spent: 20 minutes  Recommendations for Outpatient Follow-up:  1. Patient will follow up with her primary care provider in 1-2 weeks 2. Patient will follow up with infectious disease as scheduled 3. Additional followup with scheduled dialysis  Discharge Diagnoses:  Principal Problem:   TIA (transient ischemic attack) Active Problems:   Hypertension   Fever   ESRD on hemodialysis   AIDS   Enteritis due to Clostridium difficile   Discharge Condition: Stable  Diet recommendation: Renal  Filed Weights   04/06/13 1720 04/06/13 2140 04/08/13 0610  Weight: 56 kg (123 lb 7.3 oz) 55.9 kg (123 lb 3.8 oz) 53.5 kg (117 lb 15.1 oz)    History of present illness:  37 year old female with history of HIV/AIDS (last CD4 of 60 not on any ART for past 11 years due to financial issues), hypertension, end-stage renal disease on dialysis since 2012 presented to [facility] ED on 5/24 with acute onset of blurred vision followed by numbness in her left leg and arm that lasted for about 2 hours. She denied any weakness of the extremity. She then had some slurred speech. In the ED she was found to have a temperature of 101.9. Patient denies any headache, dizziness, chills, cough, URI symptoms, dysphagia, oral thrush, neck swellings, chest pain, palpitations, shortness of breath, hemoptysis, nausea, vomiting, abdominal pain, diarrhea, joint pains or weight loss. She denies any sick contacts. She denies any history of opportunistic infections.  A head CT was done in the ED which was unremarkable. Blood work done showed a normal WBC, BMET was unremarkable except for changes from end-stage renal disease.  LP was attempted in the ED but was unsuccessful. Patient was transferred to [facility] with need for an MRI of her brain. By the  time the patient was evaluated at [facility] ED her symptoms had resolved. Patient was not given any antibiotics and she did not have any fever.   Hospital Course:  Upon admission to the inpatient service, her presenting symptoms had otherwise resolved. Patient was continued on dapsone and azithromycin for prophylaxis. She is also continued on her usual Monday Wednesday Friday hemodialysis. Regarding her fever, the etiology is still uncertain. The patient was initially planned for a lumbar puncture however this was stopped as the patient's symptoms had resolved. Patient was later noted to be positive for C. difficile colitis. She was started on oral Flagyl (start date on 04/07/2013).    Consultations:  Infectious disease  Nephrology  Neurology  Discharge Exam: Filed Vitals:   04/08/13 1000 04/08/13 1030 04/08/13 1044 04/08/13 1500  BP: 136/75 144/85 157/90 153/85  Pulse: 90 106 99   Temp:   98.2 F (36.8 C)   TempSrc:   Oral   Resp: 24 24 22    Height:      Weight:      SpO2:   99%     General: Patient's awake in no apparent Cardiovascular: Regular S1-S2 Respiratory: Normal respiratory effort, crackles no wheezing  Discharge Instructions   Future Appointments Provider Department Dept Phone   04/09/2013 10:15 AM Truman Hayward, MD [facility] [phone]       Medication List    TAKE these medications       azithromycin 600 MG tablet  Commonly known as:  ZITHROMAX  Take 2 tablets (  1,200 mg total) by mouth every 7 (seven) days.     b complex-vitamin c-folic acid 0.8 MG Tabs  Take 0.8 mg by mouth at bedtime.     dapsone 100 MG tablet  Take 1 tablet (100 mg total) by mouth daily.     Dolutegravir Sodium 50 MG Tabs  Commonly known as:  TIVICAY  Take 1 tablet (50 mg total) by mouth 2 (two) times daily.     epoetin alfa 20000 UNIT/ML injection  Commonly known as:  EPOGEN,PROCRIT  Inject 20,000 Units into the skin. Every  two weeks     hydrALAZINE 25 MG tablet  Commonly known as:  APRESOLINE  Take 1 tablet (25 mg total) by mouth every 8 (eight) hours.     lamiVUDine 10 MG/ML solution  Commonly known as:  EPIVIR  Take 2.5 mLs (25 mg total) by mouth daily.     metoprolol tartrate 25 MG tablet  Commonly known as:  LOPRESSOR  Take 25 mg by mouth 2 (two) times daily. Patient does not take night dose on Tues.,Thurs.,Sat.     metroNIDAZOLE 500 MG tablet  Commonly known as:  FLAGYL  Take 1 tablet (500 mg total) by mouth every 8 (eight) hours.     tenofovir 300 MG tablet  Commonly known as:  VIREAD  Take 1 tablet (300 mg total) by mouth once a week.       Allergies  Allergen Reactions  . Sulfa Antibiotics Hives       Follow-up Information   Follow up with GOLDSBOROUGH,KELLIE A, MD In 2 weeks.   Contact information:   [place] [id number] [phone]       Follow up with Your Infectious Disease Doctor as scheduled.      Follow up with Continue your dialysis as scheduled.       The results of significant diagnostics from this hospitalization (including imaging, microbiology, ancillary and laboratory) are listed below for reference.    Significant Diagnostic Studies: Dg Chest 1 View  04/04/2013   *RADIOLOGY REPORT*  Clinical Data: Has aphasia.  Extremity weakness.  CHEST - 1 VIEW  Comparison: 09/11/2011  Findings: The heart is significantly enlarged, and marked change since prior study.  This raises a question of pericardial effusion or dilated cardiomyopathy.  There are no focal consolidations or pleural effusions.  No pulmonary edema.  IMPRESSION: Significant change in the appearance of the heart, raising question of new pericardial effusion or dilated cardiomyopathy.  Further evaluation with echocardiogram may be helpful.   Original Report Authenticated By: Nolon Nations, M.D.   Ct Head Wo Contrast  04/04/2013   *RADIOLOGY REPORT*  Clinical Data: Slurred speech, weakness,  aphasia  CT HEAD WITHOUT CONTRAST  Technique:  Contiguous axial images were obtained from the base of the skull through the vertex without contrast.  Comparison: None.  Findings: No evidence of parenchymal hemorrhage or extra-axial fluid collection. No mass lesion, mass effect, or midline shift.  No CT evidence of acute infarction.  Cerebral volume is age appropriate.  No ventriculomegaly.  The visualized paranasal sinuses are essentially clear. The mastoid air cells are unopacified.  No evidence of calvarial fracture.  IMPRESSION: No evidence of acute intracranial abnormality.  These results were called by telephone on 04/04/2013 at 1605 hours to Dr Rolland Porter, who verbally acknowledged these results.   Original Report Authenticated By: Julian Hy, M.D.   Mr Brain Wo Contrast  04/05/2013   *RADIOLOGY REPORT*  Clinical Data:  Stroke.  HIV.  Hypertension.  End-stage renal disease.  MRI HEAD WITHOUT CONTRAST MRA HEAD WITHOUT CONTRAST  Technique:  Multiplanar, multiecho pulse sequences of the brain and surrounding structures were obtained without intravenous contrast. Angiographic images of the head were obtained using MRA technique without contrast.  Comparison:  Head CT 05/24  MRI HEAD  Findings:  Diffusion imaging does not show any acute or subacute infarction.  There are mild chronic small vessel changes of the pons.  No cerebellar abnormality.  The cerebral hemispheres show scattered punctate foci of T2 and FLAIR signal within the white matter consistent with chronic small vessel disease.  No cortical or large vessel territory infarction.  No mass lesion, hemorrhage, hydrocephalus or extra-axial collection.  No pituitary mass.  No inflammatory sinus disease.  IMPRESSION: No acute finding.  Mild chronic small vessel changes throughout the brain.  MRA HEAD  Findings: Both internal carotid arteries are widely patent into the brain.  The anterior and middle cerebral vessels are patent without proximal  stenosis, aneurysm or vascular malformation.  Both vertebral arteries are widely patent to the basilar.  No basilar stenosis.  Posterior circulation branch vessels are normal.  IMPRESSION: Normal intracranial MR angiography of the large and medium-sized vessels.   Original Report Authenticated By: Nelson Chimes, M.D.   Mr Mra Head/brain Wo Cm  04/05/2013   *RADIOLOGY REPORT*  Clinical Data:  Stroke.  HIV.  Hypertension.  End-stage renal disease.  MRI HEAD WITHOUT CONTRAST MRA HEAD WITHOUT CONTRAST  Technique:  Multiplanar, multiecho pulse sequences of the brain and surrounding structures were obtained without intravenous contrast. Angiographic images of the head were obtained using MRA technique without contrast.  Comparison:  Head CT 05/24  MRI HEAD  Findings:  Diffusion imaging does not show any acute or subacute infarction.  There are mild chronic small vessel changes of the pons.  No cerebellar abnormality.  The cerebral hemispheres show scattered punctate foci of T2 and FLAIR signal within the white matter consistent with chronic small vessel disease.  No cortical or large vessel territory infarction.  No mass lesion, hemorrhage, hydrocephalus or extra-axial collection.  No pituitary mass.  No inflammatory sinus disease.  IMPRESSION: No acute finding.  Mild chronic small vessel changes throughout the brain.  MRA HEAD  Findings: Both internal carotid arteries are widely patent into the brain.  The anterior and middle cerebral vessels are patent without proximal stenosis, aneurysm or vascular malformation.  Both vertebral arteries are widely patent to the basilar.  No basilar stenosis.  Posterior circulation branch vessels are normal.  IMPRESSION: Normal intracranial MR angiography of the large and medium-sized vessels.   Original Report Authenticated By: Nelson Chimes, M.D.    Microbiology: Recent Results (from the past 240 hour(s))  CULTURE, BLOOD (ROUTINE X 2)     Status: None   Collection Time     04/04/13  3:43 PM      Result Value Range Status   Specimen Description BLOOD RIGHT ANTECUBITAL   Final   Special Requests BOTTLES DRAWN AEROBIC AND ANAEROBIC 6CC   Final   Culture NO GROWTH 4 DAYS   Final   Report Status PENDING   Incomplete  CULTURE, BLOOD (ROUTINE X 2)     Status: None   Collection Time    04/04/13  3:48 PM      Result Value Range Status   Specimen Description BLOOD RIGHT HAND   Final   Special Requests BOTTLES DRAWN AEROBIC AND ANAEROBIC [place]   Final  Culture NO GROWTH 4 DAYS   Final   Report Status PENDING   Incomplete  CULTURE, BLOOD (ROUTINE X 2)     Status: None   Collection Time    04/06/13 11:40 PM      Result Value Range Status   Specimen Description BLOOD RIGHT ARM   Final   Special Requests BOTTLES DRAWN AEROBIC AND ANAEROBIC 10CC EACH   Final   Culture  Setup Time 04/07/2013 03:34   Final   Culture     Final   Value:        BLOOD CULTURE RECEIVED NO GROWTH TO DATE CULTURE WILL BE HELD FOR 5 DAYS BEFORE ISSUING A FINAL NEGATIVE REPORT   Report Status PENDING   Incomplete  CULTURE, BLOOD (ROUTINE X 2)     Status: None   Collection Time    04/06/13 11:47 PM      Result Value Range Status   Specimen Description BLOOD RIGHT HAND   Final   Special Requests BOTTLES DRAWN AEROBIC ONLY 5CC   Final   Culture  Setup Time 04/07/2013 03:33   Final   Culture     Final   Value:        BLOOD CULTURE RECEIVED NO GROWTH TO DATE CULTURE WILL BE HELD FOR 5 DAYS BEFORE ISSUING A FINAL NEGATIVE REPORT   Report Status PENDING   Incomplete  CLOSTRIDIUM DIFFICILE BY PCR     Status: Abnormal   Collection Time    04/07/13 12:06 AM      Result Value Range Status   C difficile by pcr POSITIVE (*) NEGATIVE Final   Comment: CRITICAL RESULT CALLED TO, READ BACK BY AND VERIFIED WITH:     Earnstine Regal RN 9:50 04/07/13 ([email or web address])     Labs: Basic Metabolic Panel:  Recent Labs Lab 04/04/13 1539 04/04/13 1551 04/06/13 0345 04/06/13 1710 04/08/13 0659  NA 133* 135 134* 133*  136  K 4.1 4.2 4.8 4.9 3.6  CL 91* 95* 95* 94* 96  CO2 32  --  26 23 29   GLUCOSE 89 85 83 100* 87  BUN 21 22 42* 51* 25*  CREATININE 5.96* 5.10* 8.73* 9.87* 7.14*  CALCIUM 9.0  --  8.8 8.1* 8.8  PHOS  --   --   --  5.2* 3.6   Liver Function Tests:  Recent Labs Lab 04/04/13 1539 04/06/13 1710 04/08/13 0659  AST 43*  --   --   ALT 19  --   --   ALKPHOS 121*  --   --   BILITOT 0.4  --   --   PROT 7.7  --   --   ALBUMIN 3.0* 2.4* 2.5*   No results found for this basename: LIPASE, AMYLASE,  in the last 168 hours No results found for this basename: AMMONIA,  in the last 168 hours CBC:  Recent Labs Lab 04/04/13 1539 04/04/13 1551 04/06/13 1710 04/08/13 0658  WBC 9.8  --  7.5 6.7  NEUTROABS 6.3  --   --   --   HGB 8.8* 9.2* 7.6* 7.6*  HCT 27.2* 27.0* 22.6* 23.2*  MCV 91.0  --  88.3 89.2  PLT 173  --  193 191   Cardiac Enzymes:  Recent Labs Lab 04/04/13 1539  TROPONINI <0.30   BNP: BNP (last 3 results) No results found for this basename: PROBNP,  in the last 8760 hours CBG:  Recent Labs Lab 04/04/13 1611  GLUCAP 80  Signed:  Omid Deardorff, Orpah Melter  [facility] 04/08/2013, 3:30 PM

## 2013-04-08 NOTE — Progress Notes (Signed)
INFECTIOUS DISEASE PROGRESS NOTE  ID: Tiffany Golden is a 37 y.o. female with  Principal Problem:   TIA (transient ischemic attack) Active Problems:   Hypertension   Fever   ESRD on hemodialysis   AIDS   Enteritis due to Clostridium difficile  Subjective: Loose BM improved.   Abtx:  Anti-infectives   Start     Dose/Rate Route Frequency Ordered Stop   04/08/13 0000  azithromycin (ZITHROMAX) 600 MG tablet     1,200 mg Oral Every 7 days 04/08/13 1528     04/08/13 0000  dapsone 100 MG tablet     100 mg Oral Daily 04/08/13 1528     04/08/13 0000  Dolutegravir Sodium (TIVICAY) 50 MG TABS     50 mg Oral 2 times daily 04/08/13 1528     04/08/13 0000  lamiVUDine (EPIVIR) 10 MG/ML solution     25 mg Oral Daily 04/08/13 1528     04/08/13 0000  metroNIDAZOLE (FLAGYL) 500 MG tablet     500 mg Oral Every 8 hours 04/08/13 1528     04/08/13 0000  tenofovir (VIREAD) 300 MG tablet     300 mg Oral Weekly 04/08/13 1528     04/07/13 1400  metroNIDAZOLE (FLAGYL) tablet 500 mg     500 mg Oral 3 times per day 04/07/13 1022 04/21/13 1359   04/06/13 1700  azithromycin (ZITHROMAX) tablet 1,200 mg     1,200 mg Oral Weekly 04/05/13 1346     04/06/13 1000  lamiVUDine (EPIVIR) 10 MG/ML solution 25 mg     25 mg Oral Daily 04/05/13 1344     04/05/13 2200  Dolutegravir Sodium TABS 50 mg     50 mg Oral 2 times daily 04/05/13 1424     04/05/13 1700  dapsone tablet 100 mg     100 mg Oral Daily 04/05/13 1345     04/05/13 1500  tenofovir (VIREAD) tablet 300 mg     300 mg Oral Weekly 04/05/13 1344     04/05/13 1500  lamiVUDine (EPIVIR) 10 MG/ML solution 50 mg     50 mg Oral  Once 04/05/13 1344 04/05/13 1501      Medications:  Scheduled: . aspirin  325 mg Oral Daily  . azithromycin  1,200 mg Oral Weekly  . calcium carbonate  1 tablet Oral TID WC  . dapsone  100 mg Oral Daily  . darbepoetin (ARANESP) injection - DIALYSIS  60 mcg Intravenous Q Mon-HD  . Dolutegravir Sodium  50 mg Oral BID  . feeding  supplement  1 Container Oral BID BM  . heparin  5,000 Units Subcutaneous Q8H  . hydrALAZINE  25 mg Oral Q8H  . lamiVUDine  25 mg Oral Daily  . metoprolol tartrate  25 mg Oral Daily  . metoprolol tartrate  25 mg Oral Custom  . metroNIDAZOLE  500 mg Oral Q8H  . multivitamin  1 tablet Oral Q supper  . tenofovir  300 mg Oral Weekly    Objective: Vital signs in last 24 hours: Temp:  [97.6 F (36.4 C)-98.3 F (36.8 C)] 98.2 F (36.8 C) (05/28 1044) Pulse Rate:  [81-106] 99 (05/28 1044) Resp:  [17-25] 22 (05/28 1044) BP: (126-170)/(71-103) 153/85 mmHg (05/28 1500) SpO2:  [96 %-99 %] 99 % (05/28 1044) Weight:  [53.5 kg (117 lb 15.1 oz)] 53.5 kg (117 lb 15.1 oz) (05/28 0610)   General appearance: alert, cooperative and no distress Resp: clear to auscultation bilaterally Cardio: regular rate and rhythm  GI: normal findings: bowel sounds normal and soft, non-tender  Lab Results  Recent Labs  04/06/13 1710 04/08/13 0658 04/08/13 0659  WBC 7.5 6.7  --   HGB 7.6* 7.6*  --   HCT 22.6* 23.2*  --   NA 133*  --  136  K 4.9  --  3.6  CL 94*  --  96  CO2 23  --  29  BUN 51*  --  25*  CREATININE 9.87*  --  7.14*   Liver Panel  Recent Labs  04/06/13 1710 04/08/13 0659  ALBUMIN 2.4* 2.5*   Sedimentation Rate No results found for this basename: ESRSEDRATE,  in the last 72 hours C-Reactive Protein No results found for this basename: CRP,  in the last 72 hours  Microbiology: Recent Results (from the past 240 hour(s))  CULTURE, BLOOD (ROUTINE X 2)     Status: None   Collection Time    04/04/13  3:43 PM      Result Value Range Status   Specimen Description BLOOD RIGHT ANTECUBITAL   Final   Special Requests BOTTLES DRAWN AEROBIC AND ANAEROBIC 6CC   Final   Culture NO GROWTH 4 DAYS   Final   Report Status PENDING   Incomplete  CULTURE, BLOOD (ROUTINE X 2)     Status: None   Collection Time    04/04/13  3:48 PM      Result Value Range Status   Specimen Description BLOOD RIGHT  HAND   Final   Special Requests BOTTLES DRAWN AEROBIC AND ANAEROBIC 6CC   Final   Culture NO GROWTH 4 DAYS   Final   Report Status PENDING   Incomplete  CULTURE, BLOOD (ROUTINE X 2)     Status: None   Collection Time    04/06/13 11:40 PM      Result Value Range Status   Specimen Description BLOOD RIGHT ARM   Final   Special Requests BOTTLES DRAWN AEROBIC AND ANAEROBIC 10CC EACH   Final   Culture  Setup Time 04/07/2013 03:34   Final   Culture     Final   Value:        BLOOD CULTURE RECEIVED NO GROWTH TO DATE CULTURE WILL BE HELD FOR 5 DAYS BEFORE ISSUING A FINAL NEGATIVE REPORT   Report Status PENDING   Incomplete  CULTURE, BLOOD (ROUTINE X 2)     Status: None   Collection Time    04/06/13 11:47 PM      Result Value Range Status   Specimen Description BLOOD RIGHT HAND   Final   Special Requests BOTTLES DRAWN AEROBIC ONLY 5CC   Final   Culture  Setup Time 04/07/2013 03:33   Final   Culture     Final   Value:        BLOOD CULTURE RECEIVED NO GROWTH TO DATE CULTURE WILL BE HELD FOR 5 DAYS BEFORE ISSUING A FINAL NEGATIVE REPORT   Report Status PENDING   Incomplete  CLOSTRIDIUM DIFFICILE BY PCR     Status: Abnormal   Collection Time    04/07/13 12:06 AM      Result Value Range Status   C difficile by pcr POSITIVE (*) NEGATIVE Final   Comment: CRITICAL RESULT CALLED TO, READ BACK BY AND VERIFIED WITH:     Earnstine Regal RN 9:50 04/07/13 ([email or web address])    Studies/Results: No results found.   Assessment/Plan: AIDS  ESRD  Fever- now back with C diff+  Total days of antibiotics: 2 (  flagyl)  Day 4 ART  Has f/u appt at [facility] tomorrow Address emergency ADAP (staff already notified) Plan for 14 days of flagyl available if questions.....  Bobby Rumpf Infectious Diseases [email or web address] 04/08/2013, 3:37 PM   LOS: 4 days

## 2013-04-08 NOTE — Progress Notes (Signed)
Discharge orders received. Vitals stable & IV removed. Education, medications, and follow up information reviewed with patient. To be discharged home with family friend. Willye Javier, [place], RN

## 2013-04-08 NOTE — Procedures (Signed)
I have personally attended this patient's dialysis session.   Dialysis via left AVF at B[facility] 400 2K 2.25 Ca bath 142/93 Pre weight (in hosp gown) 53.5 (new EDW allowing for clothing 54) No issues with the HD at this time/cbd    Tiffany Golden

## 2013-04-09 ENCOUNTER — Ambulatory Visit (INDEPENDENT_AMBULATORY_CARE_PROVIDER_SITE_OTHER): Payer: 59 | Admitting: Infectious Disease

## 2013-04-09 ENCOUNTER — Encounter: Payer: Self-pay | Admitting: Infectious Disease

## 2013-04-09 VITALS — BP 159/99 | HR 85 | Temp 98.7°F | Ht 61.0 in | Wt 119.0 lb

## 2013-04-09 DIAGNOSIS — M6281 Muscle weakness (generalized): Secondary | ICD-10-CM

## 2013-04-09 DIAGNOSIS — I10 Essential (primary) hypertension: Secondary | ICD-10-CM

## 2013-04-09 DIAGNOSIS — Z21 Asymptomatic human immunodeficiency virus [HIV] infection status: Secondary | ICD-10-CM

## 2013-04-09 DIAGNOSIS — B20 Human immunodeficiency virus [HIV] disease: Secondary | ICD-10-CM

## 2013-04-09 DIAGNOSIS — N186 End stage renal disease: Secondary | ICD-10-CM

## 2013-04-09 DIAGNOSIS — R531 Weakness: Secondary | ICD-10-CM

## 2013-04-09 DIAGNOSIS — Z992 Dependence on renal dialysis: Secondary | ICD-10-CM

## 2013-04-09 DIAGNOSIS — A0472 Enterocolitis due to Clostridium difficile, not specified as recurrent: Secondary | ICD-10-CM

## 2013-04-09 LAB — CULTURE, BLOOD (ROUTINE X 2)

## 2013-04-09 MED ORDER — DOLUTEGRAVIR SODIUM 50 MG PO TABS: 50.0000 mg | ORAL_TABLET | Freq: Two times a day (BID) | ORAL | Status: DC

## 2013-04-09 MED ORDER — AZITHROMYCIN 600 MG PO TABS: 1200.0000 mg | ORAL_TABLET | ORAL | Status: DC

## 2013-04-09 MED ORDER — DAPSONE 100 MG PO TABS: 100.0000 mg | ORAL_TABLET | Freq: Every day | ORAL | Status: DC

## 2013-04-09 MED ORDER — METRONIDAZOLE 500 MG PO TABS: 500.0000 mg | ORAL_TABLET | Freq: Three times a day (TID) | ORAL | Status: DC

## 2013-04-09 MED ORDER — LAMIVUDINE 10 MG/ML PO SOLN: 25.0000 mg | Freq: Every day | ORAL | Status: DC

## 2013-04-09 MED ORDER — TENOFOVIR DISOPROXIL FUMARATE 300 MG PO TABS: 300.0000 mg | ORAL_TABLET | ORAL | Status: DC

## 2013-04-09 NOTE — Patient Instructions (Signed)
For your HIV the regimen is:  TIVICAY 50 MG (YELLOW CIRCULAR TABLET) TWICE DAILY  EPIVIR LIQUID TO TAKE 25MG  LIQUID DAILY  VIREAD 300MG  BLUE PILL EVERY WEEK  THEN TO PREVEN PCP PNEUMONIA TAKE ONE DAPSONE 100MG  TABLET DAILY  THEN TO PREVEN MAC INFECTION TAKE TWO AZITHROMYCIN TABLETS 600MG  X2= 1200MG  ONCE WEEKLY

## 2013-04-09 NOTE — Progress Notes (Signed)
HPI: Tiffany Golden is a 37 y.o. female with HIV/AIDS diagnosed 18 years prior who was previously receiving treatment at [facility] but has not been on treatment for last 8 years.  Allergies: Allergies  Allergen Reactions  . Sulfa Antibiotics Hives    Vitals: Temp: 98.7 F (37.1 C) (05/29 1026) Temp src: Oral (05/29 1026) BP: 159/99 mmHg (05/29 1026) Pulse Rate: 85 (05/29 1026)  Past Medical History: Past Medical History  Diagnosis Date  . Hypertension   . HIV infection   . Chronic kidney disease   . Anemia   . Dialysis patient     mon, wed friday    Social History: History   Social History  . Marital Status: Single    Spouse Name: N/A    Number of Children: N/A  . Years of Education: N/A   Social History Main Topics  . Smoking status: Former Smoker -- 3 years    Types: Cigarettes    Quit date: 09/05/2009  . Smokeless tobacco: None  . Alcohol Use: No  . Drug Use: No  . Sexually Active: Not Currently -- Female partner(s)     Comment: declined condoms   Other Topics Concern  . None   Social History Narrative  . None    Previous Regimen: Unsure exactly, AZT and 3TC are all she could rememeber  Current Regimen: Dolutegravir 50mg  twice daily (ESRD dose), lamivudine 25mg  daily, tenofovir 300mg  once weekly.  Labs: HIV 1 RNA Quant (copies/mL)  Date Value  03/26/2013 61345*     CD4 T Cell Abs (cmm)  Date Value  03/26/2013 60*     Hep B S Ab (no units)  Date Value  03/26/2013 REACTIVE*     Hepatitis B Surface Ag (no units)  Date Value  03/26/2013 NEGATIVE      HCV Ab (no units)  Date Value  03/26/2013 NEGATIVE     CrCl: IHD  Lipids:    Component Value Date/Time   CHOL 133 04/06/2013 0345   TRIG 164* 04/06/2013 0345   HDL 29* 04/06/2013 0345   CHOLHDL 4.6 04/06/2013 0345   VLDL 33 04/06/2013 0345   LDLCALC 71 04/06/2013 0345    Assessment: Patient has longstanding HIV but current genotype shows wild type virus. Still worry that there may be some  baseline resistance not reflected in this. Her regimen is further complicated with ESRD requiring HD. DTG has not been studied in HD patients but it is known that ESRD does cause decreased DTG levels and is dose twice daily in these patients. Still concerning considering lack of drug level data in HD patients, but this is weighed against risk of resistance with other agents. The current regimen is also confusing and worry about adherence with a once a week med, a liquid daily med, and a twice a day medication.  Recommendations: - Continue current ART regimen - Would have low threshold to simplify regimen with the following being reasonable once daily options:  rilpivirine 25mg  daily (would be off label since she is not treatment naive) abacavir 600mg  daily (would need HLA-B testing, do not see any in records) Zidovudine 300mg  daily (has had this previously and may be resistant) Lamivudine as currently prescribed  Billey Gosling, [place] [facility] 04/09/2013, 2:00 PM

## 2013-04-09 NOTE — Progress Notes (Signed)
Subjective:    Patient ID: Tiffany Golden, female    DOB: [birth year], 37 y.o.   MRN: [id number]  HPI  37 y.o. female past medical history significant for HIV and AIDS that was diagnosed approximately 18 years ago. Patient was initially treated for HIV while pregnant. She was followed initially at [facility] and was involved in a clinical trials there. She was on therapy for HIV until approximately 8 years ago when she came off medication. She states that the reason that she came off medication was that her insurance policy chains and her co-pay for antiretrovirals increased.   She does also have comorbid end-stage renal disease and is on hemodialysis since 2012.  I saw her this weekend, for workup of transient transient  Decided weakness concerning for possible stroke versus opportunistic infection. MRI of the brain failed to show any evidence of toxoplasmosis primary CNS lymphoma cryptococcoma or PML. She also had no evidence of leptomeningeal enhancement on imaging. She did have initial fever on hospital. While in the hospital I started her on antiretroviral regimen of TIVICAY bid and renally dosed epvir as well as wekly viread along with daily exam for PCP prophylaxis and weekly azithromycin. Apparently she developed loose stools and was subsequently diagnosed with Clostridium difficile colitis and placed on metronidazole 500 mg 3 times daily. He was discharged yesterday and medications were sent to her pharmacy in [place].  We double checked with the [facility] in [place] and all for antiretrovirals are covered and she should deal to pick them up today. She did miss her evening dose of dolutegravir.    Review of Systems  Constitutional: Negative for fever, chills, diaphoresis, activity change, appetite change, fatigue and unexpected weight change.  HENT: Negative for congestion, sore throat, rhinorrhea, sneezing, trouble swallowing and sinus pressure.   Eyes: Negative for photophobia and visual  disturbance.  Respiratory: Negative for cough, chest tightness, shortness of breath, wheezing and stridor.   Cardiovascular: Negative for chest pain, palpitations and leg swelling.  Gastrointestinal: Positive for abdominal pain and diarrhea. Negative for nausea, vomiting, constipation, blood in stool, abdominal distention and anal bleeding.  Genitourinary: Negative for dysuria, hematuria, flank pain and difficulty urinating.  Musculoskeletal: Negative for myalgias, back pain, joint swelling, arthralgias and gait problem.  Skin: Negative for color change, pallor, rash and wound.  Neurological: Positive for weakness. Negative for dizziness, tremors and light-headedness.  Hematological: Negative for adenopathy. Does not bruise/bleed easily.  Psychiatric/Behavioral: Negative for behavioral problems, confusion, sleep disturbance, dysphoric mood, decreased concentration and agitation.       Objective:   Physical Exam  Constitutional: She is oriented to person, place, and time. No distress.  HENT:  Head: Normocephalic and atraumatic.  Mouth/Throat: Oropharynx is clear and moist.  Eyes: Conjunctivae and EOM are normal.  Neck: Normal range of motion. Neck supple.  Cardiovascular: Normal rate, regular rhythm and normal heart sounds.  Exam reveals no friction rub.   No murmur heard. Pulmonary/Chest: Effort normal and breath sounds normal. No respiratory distress. She has no wheezes.  Abdominal: She exhibits no distension. There is no tenderness. There is no rebound.  Musculoskeletal: She exhibits no edema and no tenderness.  Lymphadenopathy:    She has cervical adenopathy.  Neurological: She is alert and oriented to person, place, and time. She exhibits normal muscle tone. Coordination normal.  Skin: Skin is warm and dry. No erythema. No pallor.  Psychiatric: She has a normal mood and affect. Her behavior is normal. Judgment and thought content normal.  Assessment & Plan:   HIV  and AIDS: Continue renally dosed at severe weekly Viread along with twice daily TIVICAY, recheck R. load and CD4 count in one month. We also get records from [facility].  Hypertension she is to continue on her metoprolol and hydralazine added in the hospital.  Clostridium difficile colitis continue on metronidazole to finish 14 day course.  Opportunistic infection prophylaxis continue on daily dapsone weekly azithromycin.  Left sided weakness: likely was TIA, resolved  ESRD on HD: --continue HD

## 2013-04-13 ENCOUNTER — Telehealth: Payer: Self-pay | Admitting: Infectious Disease

## 2013-04-13 LAB — CULTURE, BLOOD (ROUTINE X 2): Culture: NO GROWTH

## 2013-04-13 NOTE — Telephone Encounter (Signed)
Sharyn Lull can you call (at your convenience [facility] ID clinic and ask for Clois Comber, ask Eddie Dibbles if someone there can print up a "cumulative genotype" from their data base and send this with records they have from ID clinic on [address] to our fax number. Jacqualine Code number is [phone]

## 2013-04-13 NOTE — Telephone Encounter (Signed)
Thanks

## 2013-04-13 NOTE — Telephone Encounter (Signed)
Contacted [facility].  They need a signed ROI faxed to [phone] before they can send the information.  Pt notified, will come in tomorrow to sign a ROI for our office.

## 2013-04-29 ENCOUNTER — Other Ambulatory Visit (HOSPITAL_COMMUNITY): Payer: Self-pay | Admitting: *Deleted

## 2013-04-30 ENCOUNTER — Other Ambulatory Visit (HOSPITAL_COMMUNITY): Payer: Self-pay | Admitting: *Deleted

## 2013-04-30 ENCOUNTER — Encounter (HOSPITAL_COMMUNITY)
Admission: RE | Admit: 2013-04-30 | Discharge: 2013-04-30 | Disposition: A | Discharge status: Home / Self Care | Payer: 59 | Source: Ambulatory Visit | Attending: Nephrology | Admitting: Nephrology

## 2013-04-30 DIAGNOSIS — B20 Human immunodeficiency virus [HIV] disease: Secondary | ICD-10-CM | POA: Insufficient documentation

## 2013-04-30 LAB — PREPARE RBC (CROSSMATCH)

## 2013-04-30 MED ORDER — DIPHENHYDRAMINE HCL 25 MG PO CAPS
25.0000 mg | ORAL_CAPSULE | Freq: Once | ORAL | Status: AC
  Administered 2013-04-30: 25 mg via ORAL

## 2013-04-30 MED ORDER — ACETAMINOPHEN 325 MG PO TABS
650.0000 mg | ORAL_TABLET | Freq: Once | ORAL | Status: AC
  Administered 2013-04-30: 650 mg via ORAL

## 2013-05-01 ENCOUNTER — Encounter (HOSPITAL_COMMUNITY): Payer: 59

## 2013-05-04 LAB — TYPE AND SCREEN: DAT, IgG: POSITIVE

## 2013-05-07 ENCOUNTER — Other Ambulatory Visit (INDEPENDENT_AMBULATORY_CARE_PROVIDER_SITE_OTHER): Payer: 59

## 2013-05-07 DIAGNOSIS — B20 Human immunodeficiency virus [HIV] disease: Secondary | ICD-10-CM

## 2013-05-07 DIAGNOSIS — Z113 Encounter for screening for infections with a predominantly sexual mode of transmission: Secondary | ICD-10-CM

## 2013-05-07 LAB — CBC WITH DIFFERENTIAL/PLATELET
Eosinophils Relative: 8 % — ABNORMAL HIGH (ref 0–5)
HCT: 29.1 % — ABNORMAL LOW (ref 36.0–46.0)
Hemoglobin: 9.9 g/dL — ABNORMAL LOW (ref 12.0–15.0)
Lymphocytes Relative: 30 % (ref 12–46)
Lymphs Abs: 1.9 10*3/uL (ref 0.7–4.0)
MCV: 102.5 fL — ABNORMAL HIGH (ref 78.0–100.0)
Monocytes Absolute: 0.6 10*3/uL (ref 0.1–1.0)
Monocytes Relative: 9 % (ref 3–12)
Neutro Abs: 3.2 10*3/uL (ref 1.7–7.7)
RDW: 14.3 % (ref 11.5–15.5)
WBC: 6.2 10*3/uL (ref 4.0–10.5)

## 2013-05-07 LAB — COMPLETE METABOLIC PANEL WITH GFR
AST: 31 U/L (ref 0–37)
Albumin: 3.7 g/dL (ref 3.5–5.2)
Alkaline Phosphatase: 93 U/L (ref 39–117)
BUN: 12 mg/dL (ref 6–23)
GFR, Est Non African American: 10 mL/min — ABNORMAL LOW
Glucose, Bld: 77 mg/dL (ref 70–99)
Potassium: 4.7 mEq/L (ref 3.5–5.3)
Sodium: 137 mEq/L (ref 135–145)
Total Bilirubin: 0.5 mg/dL (ref 0.3–1.2)

## 2013-05-08 ENCOUNTER — Other Ambulatory Visit: Payer: Self-pay | Admitting: *Deleted

## 2013-05-08 DIAGNOSIS — B20 Human immunodeficiency virus [HIV] disease: Secondary | ICD-10-CM

## 2013-05-08 LAB — HIV-1 RNA QUANT-NO REFLEX-BLD: HIV 1 RNA Quant: 55 copies/mL — ABNORMAL HIGH (ref <20)

## 2013-05-08 LAB — T-HELPER CELL (CD4) - (RCID CLINIC ONLY): CD4 % Helper T Cell: 14 % — ABNORMAL LOW (ref 33–55)

## 2013-05-08 MED ORDER — DAPSONE 100 MG PO TABS: 100.0000 mg | ORAL_TABLET | Freq: Every day | ORAL | Status: DC

## 2013-05-08 MED ORDER — DOLUTEGRAVIR SODIUM 50 MG PO TABS: 50.0000 mg | ORAL_TABLET | Freq: Two times a day (BID) | ORAL | Status: DC

## 2013-05-08 MED ORDER — LAMIVUDINE 10 MG/ML PO SOLN: 25.0000 mg | Freq: Every day | ORAL | Status: DC

## 2013-05-08 NOTE — Telephone Encounter (Signed)
Patient has not yet signed the ROI.  Will be in office next week, patient reminded to sign it then so we can get the genotype from [facility].

## 2013-05-11 ENCOUNTER — Other Ambulatory Visit: Payer: Self-pay | Admitting: *Deleted

## 2013-05-11 DIAGNOSIS — B20 Human immunodeficiency virus [HIV] disease: Secondary | ICD-10-CM

## 2013-05-11 MED ORDER — LAMIVUDINE 10 MG/ML PO SOLN: 25.0000 mg | Freq: Every day | ORAL | Status: DC

## 2013-05-11 MED ORDER — DOLUTEGRAVIR SODIUM 50 MG PO TABS: 50.0000 mg | ORAL_TABLET | Freq: Two times a day (BID) | ORAL | Status: DC

## 2013-05-11 MED ORDER — DAPSONE 100 MG PO TABS: 100.0000 mg | ORAL_TABLET | Freq: Every day | ORAL | Status: DC

## 2013-05-21 ENCOUNTER — Ambulatory Visit: Payer: 59 | Admitting: Infectious Disease

## 2013-06-11 ENCOUNTER — Ambulatory Visit: Payer: 59 | Admitting: Infectious Disease

## 2013-07-14 ENCOUNTER — Encounter: Payer: Self-pay | Admitting: Vascular Surgery

## 2013-07-14 ENCOUNTER — Other Ambulatory Visit: Payer: Self-pay

## 2013-07-14 DIAGNOSIS — T82598A Other mechanical complication of other cardiac and vascular devices and implants, initial encounter: Secondary | ICD-10-CM

## 2013-07-16 ENCOUNTER — Ambulatory Visit (INDEPENDENT_AMBULATORY_CARE_PROVIDER_SITE_OTHER): Payer: 59 | Admitting: Infectious Disease

## 2013-07-16 ENCOUNTER — Encounter: Payer: Self-pay | Admitting: Infectious Disease

## 2013-07-16 VITALS — BP 159/110 | HR 98 | Temp 98.0°F | Ht 61.0 in | Wt 144.0 lb

## 2013-07-16 DIAGNOSIS — E785 Hyperlipidemia, unspecified: Secondary | ICD-10-CM

## 2013-07-16 DIAGNOSIS — B20 Human immunodeficiency virus [HIV] disease: Secondary | ICD-10-CM

## 2013-07-16 DIAGNOSIS — I10 Essential (primary) hypertension: Secondary | ICD-10-CM

## 2013-07-16 DIAGNOSIS — Z113 Encounter for screening for infections with a predominantly sexual mode of transmission: Secondary | ICD-10-CM

## 2013-07-16 DIAGNOSIS — A0472 Enterocolitis due to Clostridium difficile, not specified as recurrent: Secondary | ICD-10-CM

## 2013-07-16 DIAGNOSIS — Z23 Encounter for immunization: Secondary | ICD-10-CM

## 2013-07-16 DIAGNOSIS — N186 End stage renal disease: Secondary | ICD-10-CM

## 2013-07-16 LAB — CBC WITH DIFFERENTIAL/PLATELET
Basophils Absolute: 0.1 10*3/uL (ref 0.0–0.1)
Eosinophils Relative: 9 % — ABNORMAL HIGH (ref 0–5)
HCT: 37.3 % (ref 36.0–46.0)
Hemoglobin: 12.6 g/dL (ref 12.0–15.0)
Lymphocytes Relative: 26 % (ref 12–46)
Lymphs Abs: 1.8 10*3/uL (ref 0.7–4.0)
MCV: 89.4 fL (ref 78.0–100.0)
Monocytes Absolute: 0.5 10*3/uL (ref 0.1–1.0)
Neutro Abs: 4 10*3/uL (ref 1.7–7.7)
RBC: 4.17 MIL/uL (ref 3.87–5.11)
RDW: 16.4 % — ABNORMAL HIGH (ref 11.5–15.5)
WBC: 7 10*3/uL (ref 4.0–10.5)

## 2013-07-16 NOTE — Progress Notes (Signed)
Subjective:    Patient ID: Tiffany Golden, female    DOB: 1976/07/03, 37 y.o.   MRN: [id number]  HPI  38 y.o. female past medical history significant for HIV and AIDS that was diagnosed approximately 18 years ago. Patient was initially treated for HIV while pregnant. She was followed initially at [facility] and was involved in a clinical trials there. She was on therapy for HIV until approximately 8 years ago when she came off medication. She states that the reason that she came off medication was that her insurance policy chains and her co-pay for antiretrovirals increased.   She does also have comorbid end-stage renal disease and is on hemodialysis since 2012 I met her in the hospital and ultimately restarted on TIVICAY bid and renally dosed epvir as well as wekly viread along with Dapsone for PCP prophylaxis and weekly azithromycin. And then she is successfully dropped her viral load to 55 copies and her CD4 count has risen to greater than 200 copies. I will discontinue her azithromycin and repeat her labs today and if her CD4 count is still above 200 will dispense with the dapsone. She is tolerating her current antiretroviral regimen without difficulties.  Review of Systems  Constitutional: Negative for chills, diaphoresis, activity change, appetite change and unexpected weight change.  HENT: Negative for sneezing, trouble swallowing and sinus pressure.   Eyes: Negative for photophobia and visual disturbance.  Respiratory: Negative for chest tightness, wheezing and stridor.   Cardiovascular: Negative for chest pain, palpitations and leg swelling.  Gastrointestinal: Negative for nausea, abdominal pain, constipation, blood in stool, abdominal distention and anal bleeding.  Genitourinary: Negative for dysuria, hematuria, flank pain and difficulty urinating.  Musculoskeletal: Negative for myalgias, back pain, joint swelling, arthralgias and gait problem.  Skin: Negative for color change, pallor and wound.   Neurological: Negative for dizziness, tremors, weakness and light-headedness.  Hematological: Negative for adenopathy. Does not bruise/bleed easily.  Psychiatric/Behavioral: Negative for behavioral problems, confusion, sleep disturbance, dysphoric mood, decreased concentration and agitation.       Objective:   Physical Exam  Constitutional: She is oriented to person, place, and time. No distress.  HENT:  Head: Normocephalic and atraumatic.  Mouth/Throat: Oropharynx is clear and moist.  Eyes: Conjunctivae and EOM are normal.  Neck: Normal range of motion. Neck supple.  Cardiovascular: Normal rate, regular rhythm and normal heart sounds.  Exam reveals no friction rub.   No murmur heard. Pulmonary/Chest: Effort normal and breath sounds normal. No respiratory distress. She has no wheezes.  Abdominal: She exhibits no distension. There is no tenderness. There is no rebound.  Musculoskeletal: She exhibits no edema and no tenderness.  Lymphadenopathy:    She has cervical adenopathy.  Neurological: She is alert and oriented to person, place, and time. She exhibits normal muscle tone. Coordination normal.  Skin: Skin is warm and dry. No erythema. No pallor.  Psychiatric: She has a normal mood and affect. Her behavior is normal. Judgment and thought content normal.          Assessment & Plan:   HIV and AIDS: Continue renally dosed at severe weekly Viread along with twice daily TIVICAY, recheck viral load and CD4 count in one month. We also get records from [facility]. If CD4 count still above 200 discontinued dapsone  Hypertension : Up a bit in clinic but she states it has been within range when being seen by her hemodialysis staff and by the nephrologist to her dosing her antihypertensive medications.  Clostridium  difficile colitis no evidence of recurrence  Opportunistic infection prophylaxis see above discussion  Left sided weakness: likely was TIA, resolved  ESRD on  HD: --continue HD

## 2013-07-20 LAB — HIV-1 RNA QUANT-NO REFLEX-BLD: HIV-1 RNA Quant, Log: <1.3 {Log} (ref <1.30)

## 2013-07-22 LAB — HLA B*5701

## 2013-07-29 ENCOUNTER — Encounter: Payer: Self-pay | Admitting: Vascular Surgery

## 2013-07-30 ENCOUNTER — Ambulatory Visit (INDEPENDENT_AMBULATORY_CARE_PROVIDER_SITE_OTHER): Payer: 59 | Admitting: Vascular Surgery

## 2013-07-30 ENCOUNTER — Encounter: Payer: Self-pay | Admitting: *Deleted

## 2013-07-30 ENCOUNTER — Other Ambulatory Visit: Payer: Self-pay | Admitting: *Deleted

## 2013-07-30 ENCOUNTER — Encounter: Payer: Self-pay | Admitting: Vascular Surgery

## 2013-07-30 ENCOUNTER — Encounter (INDEPENDENT_AMBULATORY_CARE_PROVIDER_SITE_OTHER): Payer: 59 | Admitting: *Deleted

## 2013-07-30 VITALS — BP 145/92 | HR 80 | Ht 61.0 in | Wt 146.1 lb

## 2013-07-30 DIAGNOSIS — T82598A Other mechanical complication of other cardiac and vascular devices and implants, initial encounter: Secondary | ICD-10-CM

## 2013-07-30 DIAGNOSIS — N186 End stage renal disease: Secondary | ICD-10-CM

## 2013-07-30 NOTE — Progress Notes (Signed)
[facility] [place] HISTORY AND PHYSICAL   CC: Pseudoaneurysm left B-C AVF Referring Physician: Dr. Jimmy Footman HD M,W,F at [facility] st facility  History of Present Illness: Tiffany Golden is a 37 y.o. female Who had a left BC AVF placed by Dr. Oneida Alar in Oct of 2012.  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 This has not bled and HD Tech has been avoiding puncturing this area with needles.  Pt is here to discuss poss revision of AVF. Pt denies any signs or symptoms in left hand.  She does state if the needle is placed at a certain angle she sometimes has pain in the area.  No results found.  Past Medical History  Diagnosis Date  . Hypertension   . HIV infection   . Chronic kidney disease   . Anemia   . Dialysis patient     mon, wed 40  . ESRD (end stage renal disease)   . Stroke     ROS: [x]  Positive   [ ]  Denies    General: [ ]  Weight loss, [ ]  Fever, [ ]  chills Neurologic: [ ]  Dizziness, [ ]  Blackouts, [ ]  Seizure [ ]  Stroke, [x ] "Mini stroke", [ ]  Slurred speech, [ ]  Temporary blindness; [ ]  weakness in arms or legs, [ ]  Hoarseness Cardiac: [ ]  Chest pain/pressure, [ ]  Shortness of breath at rest [ ]  Shortness of breath with exertion, [ ]  Atrial fibrillation or irregular heartbeat Vascular: [ ]  Pain in legs with walking, [ ]  Pain in legs at rest, [ ]  Pain in legs at night,  [ ]  Non-healing ulcer, [ ]  Blood clot in vein/DVT,   Pulmonary: [ ]  Home oxygen, [ ]  Productive cough, [ ]  Coughing up blood, [ ]  Asthma,  [ ]  Wheezing Musculoskeletal:  [ ]  Arthritis, [ ]  Low back pain, [ ]  Joint pain Hematologic: [ ]  Easy Bruising, [ x] Anemia; [ ]  Hepatitis Gastrointestinal: [ ]  Blood in stool, [ ]  Gastroesophageal Reflux/heartburn, [ ]  Trouble swallowing Urinary: [x ] chronic Kidney disease, [x ] on HD - [x ] MWF or [ ]  TTHS, [ ]  Burning with urination, [ ]  Difficulty urinating Skin: [ ]  Rashes, [ ]   Wounds Psychological: [ ]  Anxiety, [ ]  Depression   Social History History  Substance Use Topics  . Smoking status: Former Smoker -- 3 years    Types: Cigarettes    Quit date: 09/05/2009  . Smokeless tobacco: Not on file  . Alcohol Use: No    Family History Family History  Problem Relation Age of Onset  . Hyperlipidemia Mother   . Heart disease Father     Hx CABG  . Cancer - Other Mother     Hx partial hysterectomy    Allergies  Allergen Reactions  . Sulfa Antibiotics Hives  . Fortaz [Ceftazidime Sodium In D5w] Rash    Head-toe  . Vancomycin Rash    Head-toe    Current Outpatient Prescriptions  Medication Sig Dispense Refill  . B Complex-C-Folic Acid (RENA-VITE RX) 1 MG TABS Take 1 tablet by mouth daily.      Marland Kitchen b complex-vitamin c-folic acid (NEPHRO-VITE) 0.8 MG TABS Take 0.8 mg by mouth at bedtime.      . dolutegravir (TIVICAY) 50 MG tablet Take 1 tablet (50 mg total) by mouth 2 (two) times daily.  60 tablet  11  . lamiVUDine (EPIVIR) 10 MG/ML solution Take  2.5 mLs (25 mg total) by mouth daily.  240 mL  11  . metoprolol tartrate (LOPRESSOR) 25 MG tablet Take 25 mg by mouth 2 (two) times daily. Patient does not take night dose on Tues.,Thurs.,Sat.      Marland Kitchen tenofovir (VIREAD) 300 MG tablet Take 1 tablet (300 mg total) by mouth once a week.  4 tablet  11  . dapsone 100 MG tablet Take 1 tablet (100 mg total) by mouth daily.  30 tablet  11  . epoetin alfa (EPOGEN,PROCRIT) 09811 UNIT/ML injection Inject 20,000 Units into the skin. Every two weeks       . hydrALAZINE (APRESOLINE) 25 MG tablet Take 1 tablet (25 mg total) by mouth every 8 (eight) hours.  90 tablet  0   No current facility-administered medications for this visit.    Physical Examination  Filed Vitals:   07/30/13 1442  BP: 145/92  Pulse: 80    Body mass index is 27.62 kg/(m^2).  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 Speech is fluent/normal   ASSESSMENT: Tiffany Golden is a 37 y.o. 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 there is concern that this area could potentially rupture and bleed. PLAN:  Revision of Left B-C AVF and placement of diatek on Oct 7th, 2014  Clinic MD: [facility]  Richrd Prime 07/30/2013 3:39 PM  History and exam details as above. The skin his skin over the fistula aneurysm. This requires revision most likely with plication. However she may have some narrowing in the fistula but may need to be addressed simultaneously. I do not believe that she will have enough room to cannulate the fistula while we are waiting for the midportion to heal. We will place a Diatek catheter at the same time. Risks benefits possible complications and procedure details of fistula revision as well as Diatek catheter placement were described to the patient today. She understands and agreed to proceed.  Ruta Hinds, MD [facility] [place] Office: [phone] Pager: [phone]

## 2013-08-31 ENCOUNTER — Encounter (HOSPITAL_COMMUNITY): Payer: Self-pay | Admitting: Pharmacy Technician

## 2013-08-31 ENCOUNTER — Encounter (HOSPITAL_COMMUNITY): Payer: Self-pay | Admitting: *Deleted

## 2013-08-31 MED ORDER — CLINDAMYCIN PHOSPHATE 900 MG/50ML IV SOLN
900.0000 mg | INTRAVENOUS | Status: AC
  Administered 2013-09-01: 900 mg via INTRAVENOUS
  Filled 2013-08-31: qty 50

## 2013-09-01 ENCOUNTER — Ambulatory Visit (HOSPITAL_COMMUNITY): Payer: 59

## 2013-09-01 ENCOUNTER — Ambulatory Visit (HOSPITAL_COMMUNITY)
Admission: RE | Admit: 2013-09-01 | Discharge: 2013-09-01 | Disposition: A | Discharge status: Home / Self Care | Payer: 59 | Source: Ambulatory Visit | Attending: Vascular Surgery | Admitting: Vascular Surgery

## 2013-09-01 ENCOUNTER — Ambulatory Visit (HOSPITAL_COMMUNITY): Payer: 59 | Admitting: Anesthesiology

## 2013-09-01 ENCOUNTER — Encounter (HOSPITAL_COMMUNITY): Payer: Self-pay | Admitting: Anesthesiology

## 2013-09-01 ENCOUNTER — Encounter (HOSPITAL_COMMUNITY)
Admission: RE | Disposition: A | Discharge status: Home / Self Care | Payer: Self-pay | Source: Ambulatory Visit | Attending: Vascular Surgery

## 2013-09-01 ENCOUNTER — Encounter (HOSPITAL_COMMUNITY): Payer: 59 | Admitting: Anesthesiology

## 2013-09-01 DIAGNOSIS — Z79899 Other long term (current) drug therapy: Secondary | ICD-10-CM | POA: Insufficient documentation

## 2013-09-01 DIAGNOSIS — Y832 Surgical operation with anastomosis, bypass or graft as the cause of abnormal reaction of the patient, or of later complication, without mention of misadventure at the time of the procedure: Secondary | ICD-10-CM | POA: Insufficient documentation

## 2013-09-01 DIAGNOSIS — N186 End stage renal disease: Secondary | ICD-10-CM | POA: Insufficient documentation

## 2013-09-01 DIAGNOSIS — Z01812 Encounter for preprocedural laboratory examination: Secondary | ICD-10-CM | POA: Insufficient documentation

## 2013-09-01 DIAGNOSIS — Z992 Dependence on renal dialysis: Secondary | ICD-10-CM | POA: Insufficient documentation

## 2013-09-01 DIAGNOSIS — T82898A Other specified complication of vascular prosthetic devices, implants and grafts, initial encounter: Secondary | ICD-10-CM | POA: Insufficient documentation

## 2013-09-01 DIAGNOSIS — Z8673 Personal history of transient ischemic attack (TIA), and cerebral infarction without residual deficits: Secondary | ICD-10-CM | POA: Insufficient documentation

## 2013-09-01 DIAGNOSIS — Z21 Asymptomatic human immunodeficiency virus [HIV] infection status: Secondary | ICD-10-CM | POA: Insufficient documentation

## 2013-09-01 DIAGNOSIS — D649 Anemia, unspecified: Secondary | ICD-10-CM | POA: Insufficient documentation

## 2013-09-01 DIAGNOSIS — Z87891 Personal history of nicotine dependence: Secondary | ICD-10-CM | POA: Insufficient documentation

## 2013-09-01 DIAGNOSIS — I120 Hypertensive chronic kidney disease with stage 5 chronic kidney disease or end stage renal disease: Secondary | ICD-10-CM | POA: Insufficient documentation

## 2013-09-01 DIAGNOSIS — Z01818 Encounter for other preprocedural examination: Secondary | ICD-10-CM | POA: Insufficient documentation

## 2013-09-01 HISTORY — DX: Transient cerebral ischemic attack, unspecified: G45.9

## 2013-09-01 HISTORY — DX: Other complications of anesthesia, initial encounter: T88.59XA

## 2013-09-01 HISTORY — PX: REVISON OF ARTERIOVENOUS FISTULA: SHX6074

## 2013-09-01 HISTORY — DX: Other specified postprocedural states: R11.2

## 2013-09-01 HISTORY — DX: Adverse effect of unspecified anesthetic, initial encounter: T41.45XA

## 2013-09-01 HISTORY — PX: PATCH ANGIOPLASTY: SHX6230

## 2013-09-01 HISTORY — PX: INSERTION OF DIALYSIS CATHETER: SHX1324

## 2013-09-01 HISTORY — DX: Other specified postprocedural states: Z98.890

## 2013-09-01 LAB — POCT I-STAT 4, (NA,K, GLUC, HGB,HCT)
Glucose, Bld: 79 mg/dL (ref 70–99)
HCT: 43 % (ref 36.0–46.0)
Sodium: 137 mEq/L (ref 135–145)

## 2013-09-01 SURGERY — REVISON OF ARTERIOVENOUS FISTULA
Anesthesia: General | Site: Arm Upper | Laterality: Right | Wound class: Clean

## 2013-09-01 MED ORDER — MIDAZOLAM HCL 5 MG/5ML IJ SOLN
INTRAMUSCULAR | Status: DC | PRN
  Administered 2013-09-01 (×2): 1 mg via INTRAVENOUS

## 2013-09-01 MED ORDER — HEPARIN SODIUM (PORCINE) 1000 UNIT/ML IJ SOLN
INTRAMUSCULAR | Status: DC | PRN
  Administered 2013-09-01: 4000 [IU] via INTRAVENOUS

## 2013-09-01 MED ORDER — OXYCODONE-ACETAMINOPHEN 5-325 MG PO TABS: 1.0000 | ORAL_TABLET | Freq: Four times a day (QID) | ORAL | Status: DC | PRN

## 2013-09-01 MED ORDER — EPHEDRINE SULFATE 50 MG/ML IJ SOLN
INTRAMUSCULAR | Status: DC | PRN
  Administered 2013-09-01: 5 mg via INTRAVENOUS
  Administered 2013-09-01 (×2): 10 mg via INTRAVENOUS

## 2013-09-01 MED ORDER — FENTANYL CITRATE 0.05 MG/ML IJ SOLN
25.0000 ug | INTRAMUSCULAR | Status: DC | PRN
  Administered 2013-09-01: 25 ug via INTRAVENOUS
  Administered 2013-09-01: 50 ug via INTRAVENOUS
  Administered 2013-09-01: 25 ug via INTRAVENOUS

## 2013-09-01 MED ORDER — HEPARIN SODIUM (PORCINE) 1000 UNIT/ML IJ SOLN
INTRAMUSCULAR | Status: DC | PRN
  Administered 2013-09-01: 7000 [IU] via INTRAVENOUS

## 2013-09-01 MED ORDER — 0.9 % SODIUM CHLORIDE (POUR BTL) OPTIME
TOPICAL | Status: DC | PRN
  Administered 2013-09-01: 1000 mL

## 2013-09-01 MED ORDER — PROTAMINE SULFATE 10 MG/ML IV SOLN
INTRAVENOUS | Status: DC | PRN
  Administered 2013-09-01: 20 mg via INTRAVENOUS
  Administered 2013-09-01: 30 mg via INTRAVENOUS
  Administered 2013-09-01: 20 mg via INTRAVENOUS

## 2013-09-01 MED ORDER — FENTANYL CITRATE 0.05 MG/ML IJ SOLN: 50.0000 ug | Freq: Once | INTRAMUSCULAR | Status: DC

## 2013-09-01 MED ORDER — ONDANSETRON HCL 4 MG/2ML IJ SOLN
INTRAMUSCULAR | Status: DC | PRN
  Administered 2013-09-01: 4 mg via INTRAVENOUS

## 2013-09-01 MED ORDER — PROPOFOL 10 MG/ML IV BOLUS
INTRAVENOUS | Status: DC | PRN
  Administered 2013-09-01: 140 mg via INTRAVENOUS
  Administered 2013-09-01: 60 mg via INTRAVENOUS

## 2013-09-01 MED ORDER — MIDAZOLAM HCL 2 MG/2ML IJ SOLN: 1.0000 mg | INTRAMUSCULAR | Status: DC | PRN

## 2013-09-01 MED ORDER — SODIUM CHLORIDE 0.9 % IV SOLN: INTRAVENOUS | Status: DC

## 2013-09-01 MED ORDER — HEPARIN SODIUM (PORCINE) 1000 UNIT/ML IJ SOLN
INTRAMUSCULAR | Status: AC
  Filled 2013-09-01: qty 1

## 2013-09-01 MED ORDER — SODIUM CHLORIDE 0.9 % IV SOLN
INTRAVENOUS | Status: DC | PRN
  Administered 2013-09-01 (×2): via INTRAVENOUS

## 2013-09-01 MED ORDER — OXYCODONE HCL 5 MG PO TABS: 5.0000 mg | ORAL_TABLET | Freq: Once | ORAL | Status: DC | PRN

## 2013-09-01 MED ORDER — FENTANYL CITRATE 0.05 MG/ML IJ SOLN
INTRAMUSCULAR | Status: DC | PRN
  Administered 2013-09-01 (×6): 50 ug via INTRAVENOUS

## 2013-09-01 MED ORDER — SODIUM CHLORIDE 0.9 % IR SOLN
Status: DC | PRN
  Administered 2013-09-01: 08:00:00

## 2013-09-01 MED ORDER — OXYCODONE HCL 5 MG/5ML PO SOLN: 5.0000 mg | Freq: Once | ORAL | Status: DC | PRN

## 2013-09-01 MED ORDER — THROMBIN 20000 UNITS EX SOLR
CUTANEOUS | Status: AC
  Filled 2013-09-01: qty 20000

## 2013-09-01 MED ORDER — LIDOCAINE HCL (CARDIAC) 20 MG/ML IV SOLN
INTRAVENOUS | Status: DC | PRN
  Administered 2013-09-01: 60 mg via INTRAVENOUS

## 2013-09-01 MED ORDER — PROMETHAZINE HCL 25 MG/ML IJ SOLN: 6.2500 mg | INTRAMUSCULAR | Status: DC | PRN

## 2013-09-01 MED ORDER — FENTANYL CITRATE 0.05 MG/ML IJ SOLN
INTRAMUSCULAR | Status: AC
  Filled 2013-09-01: qty 2

## 2013-09-01 MED ORDER — ARTIFICIAL TEARS OP OINT
TOPICAL_OINTMENT | OPHTHALMIC | Status: DC | PRN
  Administered 2013-09-01: 1 via OPHTHALMIC

## 2013-09-01 MED ORDER — LIDOCAINE HCL (PF) 1 % IJ SOLN
INTRAMUSCULAR | Status: AC
  Filled 2013-09-01: qty 30

## 2013-09-01 SURGICAL SUPPLY — 79 items
ADH SKN CLS APL DERMABOND .7 (GAUZE/BANDAGES/DRESSINGS) ×4
BAG DECANTER FOR FLEXI CONT (MISCELLANEOUS) ×2 IMPLANT
BANDAGE ELASTIC 4 VELCRO ST LF (GAUZE/BANDAGES/DRESSINGS) ×1 IMPLANT
CANISTER SUCTION 2500CC (MISCELLANEOUS) ×3 IMPLANT
CATH CANNON HEMO 15F 50CM (CATHETERS) IMPLANT
CATH CANNON HEMO 15FR 19 (HEMODIALYSIS SUPPLIES) ×1 IMPLANT
CATH CANNON HEMO 15FR 23CM (HEMODIALYSIS SUPPLIES) IMPLANT
CATH CANNON HEMO 15FR 31CM (HEMODIALYSIS SUPPLIES) IMPLANT
CATH CANNON HEMO 15FR 32 (HEMODIALYSIS SUPPLIES) IMPLANT
CATH CANNON HEMO 15FR 32CM (HEMODIALYSIS SUPPLIES) IMPLANT
CATH STRAIGHT 5FR 65CM (CATHETERS) IMPLANT
CHLORAPREP W/TINT 26ML (MISCELLANEOUS) ×3 IMPLANT
CLIP TI MEDIUM 6 (CLIP) ×3 IMPLANT
CLIP TI WIDE RED SMALL 6 (CLIP) ×3 IMPLANT
COVER PROBE W GEL 5X96 (DRAPES) ×3 IMPLANT
COVER SURGICAL LIGHT HANDLE (MISCELLANEOUS) ×3 IMPLANT
DECANTER SPIKE VIAL GLASS SM (MISCELLANEOUS) ×2 IMPLANT
DERMABOND ADVANCED (GAUZE/BANDAGES/DRESSINGS) ×2
DERMABOND ADVANCED .7 DNX12 (GAUZE/BANDAGES/DRESSINGS) ×2 IMPLANT
DRAIN PENROSE 1/4X12 LTX STRL (WOUND CARE) ×3 IMPLANT
DRAPE C-ARM 42X72 X-RAY (DRAPES) ×3 IMPLANT
DRAPE CHEST BREAST 15X10 FENES (DRAPES) ×3 IMPLANT
ELECT REM PT RETURN 9FT ADLT (ELECTROSURGICAL) ×3
ELECTRODE REM PT RTRN 9FT ADLT (ELECTROSURGICAL) ×2 IMPLANT
GAUZE SPONGE 2X2 8PLY STRL LF (GAUZE/BANDAGES/DRESSINGS) ×2 IMPLANT
GAUZE SPONGE 4X4 16PLY XRAY LF (GAUZE/BANDAGES/DRESSINGS) ×2 IMPLANT
GEL ULTRASOUND 20GR AQUASONIC (MISCELLANEOUS) IMPLANT
GLOVE BIO SURGEON STRL SZ7.5 (GLOVE) ×3 IMPLANT
GLOVE BIOGEL PI IND STRL 6 (GLOVE) IMPLANT
GLOVE BIOGEL PI IND STRL 6.5 (GLOVE) IMPLANT
GLOVE BIOGEL PI IND STRL 7.0 (GLOVE) IMPLANT
GLOVE BIOGEL PI IND STRL 7.5 (GLOVE) IMPLANT
GLOVE BIOGEL PI INDICATOR 6 (GLOVE) ×1
GLOVE BIOGEL PI INDICATOR 6.5 (GLOVE) ×2
GLOVE BIOGEL PI INDICATOR 7.0 (GLOVE) ×1
GLOVE BIOGEL PI INDICATOR 7.5 (GLOVE) ×2
GLOVE ECLIPSE 6.5 STRL STRAW (GLOVE) ×2 IMPLANT
GLOVE SS BIOGEL STRL SZ 7 (GLOVE) IMPLANT
GLOVE SUPERSENSE BIOGEL SZ 7 (GLOVE) ×2
GLOVE SURG SS PI 6.0 STRL IVOR (GLOVE) ×1 IMPLANT
GLOVE SURG SS PI 7.0 STRL IVOR (GLOVE) ×1 IMPLANT
GOWN PREVENTION PLUS XLARGE (GOWN DISPOSABLE) ×4 IMPLANT
GOWN PREVENTION PLUS XXLARGE (GOWN DISPOSABLE) ×1 IMPLANT
GOWN STRL NON-REIN LRG LVL3 (GOWN DISPOSABLE) ×10 IMPLANT
KIT BASIN OR (CUSTOM PROCEDURE TRAY) ×3 IMPLANT
KIT ROOM TURNOVER OR (KITS) ×3 IMPLANT
LOOP VESSEL MINI RED (MISCELLANEOUS) IMPLANT
NDL 18GX1X1/2 (RX/OR ONLY) (NEEDLE) ×2 IMPLANT
NDL HYPO 25GX1X1/2 BEV (NEEDLE) ×2 IMPLANT
NEEDLE 18GX1X1/2 (RX/OR ONLY) (NEEDLE) ×3 IMPLANT
NEEDLE 22X1 1/2 (OR ONLY) (NEEDLE) ×3 IMPLANT
NEEDLE HYPO 25GX1X1/2 BEV (NEEDLE) IMPLANT
NS IRRIG 1000ML POUR BTL (IV SOLUTION) ×3 IMPLANT
PACK CV ACCESS (CUSTOM PROCEDURE TRAY) ×3 IMPLANT
PACK SURGICAL SETUP 50X90 (CUSTOM PROCEDURE TRAY) ×2 IMPLANT
PAD ARMBOARD 7.5X6 YLW CONV (MISCELLANEOUS) ×6 IMPLANT
PATCH VASCULAR VASCU GUARD 1X6 (Vascular Products) ×1 IMPLANT
SET MICROPUNCTURE 5F STIFF (MISCELLANEOUS) IMPLANT
SPONGE GAUZE 2X2 STER 10/PKG (GAUZE/BANDAGES/DRESSINGS) ×2
SPONGE GAUZE 4X4 12PLY (GAUZE/BANDAGES/DRESSINGS) ×1 IMPLANT
SPONGE SURGIFOAM ABS GEL 100 (HEMOSTASIS) IMPLANT
SUT ETHILON 3 0 PS 1 (SUTURE) ×3 IMPLANT
SUT PROLENE 5 0 C 1 24 (SUTURE) ×4 IMPLANT
SUT PROLENE 6 0 CC (SUTURE) ×1 IMPLANT
SUT PROLENE 7 0 BV 1 (SUTURE) ×3 IMPLANT
SUT VIC AB 3-0 SH 27 (SUTURE) ×3
SUT VIC AB 3-0 SH 27X BRD (SUTURE) ×2 IMPLANT
SUT VICRYL 4-0 PS2 18IN ABS (SUTURE) ×3 IMPLANT
SYR 20CC LL (SYRINGE) ×4 IMPLANT
SYR 30ML LL (SYRINGE) IMPLANT
SYR 5ML LL (SYRINGE) ×6 IMPLANT
SYR CONTROL 10ML LL (SYRINGE) ×2 IMPLANT
SYRINGE 10CC LL (SYRINGE) ×2 IMPLANT
TAPE CLOTH SURG 4X10 WHT LF (GAUZE/BANDAGES/DRESSINGS) ×2 IMPLANT
TOWEL OR 17X24 6PK STRL BLUE (TOWEL DISPOSABLE) ×3 IMPLANT
TOWEL OR 17X26 10 PK STRL BLUE (TOWEL DISPOSABLE) ×3 IMPLANT
UNDERPAD 30X30 INCONTINENT (UNDERPADS AND DIAPERS) ×3 IMPLANT
WATER STERILE IRR 1000ML POUR (IV SOLUTION) ×3 IMPLANT
WIRE AMPLATZ SS-J .035X180CM (WIRE) IMPLANT

## 2013-09-01 NOTE — Transfer of Care (Signed)
Immediate Anesthesia Transfer of Care Note  Patient: Tiffany Golden  Procedure(s) Performed: Procedure(s): Plication left arm fistula (Left) INSERTION OF DIALYSIS CATHETER Right Internal Jugular (Right) PATCH ANGIOPLASTY (Left)  Patient Location: PACU  Anesthesia Type:General  Level of Consciousness: awake, alert  and oriented  Airway & Oxygen Therapy: Patient Spontanous Breathing and Patient connected to nasal cannula oxygen  Post-op Assessment: Report given to PACU RN, Post -op Vital signs reviewed and stable and Patient moving all extremities X 4  Post vital signs: Reviewed and stable  Complications: No apparent anesthesia complications

## 2013-09-01 NOTE — Anesthesia Preprocedure Evaluation (Signed)
Anesthesia Evaluation  Patient identified by MRN, date of birth, ID band Patient awake    Reviewed: Allergy & Precautions, H&P , NPO status , Patient's Chart, lab work & pertinent test results  History of Anesthesia Complications (+) PONV  Airway Mallampati: I TM Distance: >3 FB Neck ROM: Full    Dental   Pulmonary former smoker,  breath sounds clear to auscultation        Cardiovascular hypertension, Rhythm:Regular Rate:Normal     Neuro/Psych TIACVA    GI/Hepatic   Endo/Other    Renal/GU ESRFRenal disease     Musculoskeletal   Abdominal   Peds  Hematology  (+) HIV,   Anesthesia Other Findings   Reproductive/Obstetrics                           Anesthesia Physical Anesthesia Plan  ASA: III  Anesthesia Plan: General   Post-op Pain Management:    Induction: Intravenous  Airway Management Planned: LMA  Additional Equipment:   Intra-op Plan:   Post-operative Plan: Extubation in OR  Informed Consent: I have reviewed the patients History and Physical, chart, labs and discussed the procedure including the risks, benefits and alternatives for the proposed anesthesia with the patient or authorized representative who has indicated his/her understanding and acceptance.     Plan Discussed with: CRNA and Surgeon  Anesthesia Plan Comments:         Anesthesia Quick Evaluation

## 2013-09-01 NOTE — H&P (Signed)
[facility] [place] HISTORY AND PHYSICAL    CC: Pseudoaneurysm left B-C AVF Referring Physician: Dr. Jimmy Footman HD M,W,F at [facility] st facility  History of Present Illness: Tiffany Golden is a 37 y.o. female Who had a left BC AVF placed by Dr. Oneida Alar in Oct of 2012.  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 This has not bled and HD Tech has been avoiding puncturing this area with needles.  Pt is here to discuss poss revision of AVF. Pt denies any signs or symptoms in left hand.  She does state if the needle is placed at a certain angle she sometimes has pain in the area.  No results found.    Past Medical History   Diagnosis  Date   .  Hypertension     .  HIV infection     .  Chronic kidney disease     .  Anemia     .  Dialysis patient         mon, wed 33   .  ESRD (end stage renal disease)     .  Stroke       ROS: [x]  Positive   [ ]  Denies     General: [ ]  Weight loss, [ ]  Fever, [ ]  chills Neurologic: [ ]  Dizziness, [ ]  Blackouts, [ ]  Seizure [ ]  Stroke, [x ] "Mini stroke", [ ]  Slurred speech, [ ]  Temporary blindness; [ ]  weakness in arms or legs, [ ]  Hoarseness Cardiac: [ ]  Chest pain/pressure, [ ]  Shortness of breath at rest [ ]  Shortness of breath with exertion, [ ]  Atrial fibrillation or irregular heartbeat Vascular: [ ]  Pain in legs with walking, [ ]  Pain in legs at rest, [ ]  Pain in legs at night,  [ ]  Non-healing ulcer, [ ]  Blood clot in vein/DVT,    Pulmonary: [ ]  Home oxygen, [ ]  Productive cough, [ ]  Coughing up blood, [ ]  Asthma,  [ ]  Wheezing Musculoskeletal:  [ ]  Arthritis, [ ]  Low back pain, [ ]  Joint pain Hematologic: [ ]  Easy Bruising, [ x] Anemia; [ ]  Hepatitis Gastrointestinal: [ ]  Blood in stool, [ ]  Gastroesophageal Reflux/heartburn, [ ]  Trouble swallowing Urinary: [x ] chronic Kidney disease, [x ] on HD - [x ] MWF or [ ]  TTHS, [ ]  Burning with urination, [ ]  Difficulty  urinating Skin: [ ]  Rashes, [ ]  Wounds Psychological: [ ]  Anxiety, [ ]  Depression   Social History History   Substance Use Topics   .  Smoking status:  Former Smoker -- 3 years       Types:  Cigarettes       Quit date:  09/05/2009   .  Smokeless tobacco:  Not on file   .  Alcohol Use:  No     Family History Family History   Problem  Relation  Age of Onset   .  Hyperlipidemia  Mother     .  Heart disease  Father         Hx CABG   .  Cancer - Other  Mother         Hx partial hysterectomy       Allergies   Allergen  Reactions   .  Sulfa Antibiotics  Hives   .  Fortaz [Ceftazidime Sodium In D5w]  Rash       Head-toe   .  Vancomycin  Rash       Head-toe       Current Outpatient Prescriptions   Medication  Sig  Dispense  Refill   .  B Complex-C-Folic Acid (RENA-VITE RX) 1 MG TABS  Take 1 tablet by mouth daily.         Marland Kitchen  b complex-vitamin c-folic acid (NEPHRO-VITE) 0.8 MG TABS  Take 0.8 mg by mouth at bedtime.         .  dolutegravir (TIVICAY) 50 MG tablet  Take 1 tablet (50 mg total) by mouth 2 (two) times daily.   60 tablet   11   .  lamiVUDine (EPIVIR) 10 MG/ML solution  Take 2.5 mLs (25 mg total) by mouth daily.   240 mL   11   .  metoprolol tartrate (LOPRESSOR) 25 MG tablet  Take 25 mg by mouth 2 (two) times daily. Patient does not take night dose on Tues.,Thurs.,Sat.         Marland Kitchen  tenofovir (VIREAD) 300 MG tablet  Take 1 tablet (300 mg total) by mouth once a week.   4 tablet   11   .  dapsone 100 MG tablet  Take 1 tablet (100 mg total) by mouth daily.   30 tablet   11   .  epoetin alfa (EPOGEN,PROCRIT) 09811 UNIT/ML injection  Inject 20,000 Units into the skin. Every two weeks          .  hydrALAZINE (APRESOLINE) 25 MG tablet  Take 1 tablet (25 mg total) by mouth every 8 (eight) hours.   90 tablet   0      No current facility-administered medications for this visit.     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 Speech is fluent/normal   ASSESSMENT: Tiffany Golden is a 37 y.o. 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 there is concern that this area could potentially rupture and bleed. PLAN:  Revision of Left B-C AVF and placement of diatek   History and exam details as above. The skin his skin over the fistula aneurysm. This requires revision most likely with plication. However she may have some narrowing in the fistula but may need to be addressed simultaneously. I do not believe that she will have enough room to cannulate the fistula while we are waiting for the  midportion to heal. We will place a Diatek catheter at the same time. Risks benefits possible complications and procedure details of fistula revision as well as Diatek catheter placement were described to the patient today. She understands and agreed to proceed.  Ruta Hinds, MD [facility] [place] Office: [phone] Pager: [phone]

## 2013-09-01 NOTE — Anesthesia Procedure Notes (Signed)
Procedure Name: LMA Insertion Date/Time: 09/01/2013 7:50 AM Performed by: Erik Obey Pre-anesthesia Checklist: Patient identified, Timeout performed, Emergency Drugs available, Suction available and Patient being monitored Patient Re-evaluated:Patient Re-evaluated prior to inductionOxygen Delivery Method: Circle system utilized Preoxygenation: Pre-oxygenation with 100% oxygen Intubation Type: IV induction LMA: LMA inserted LMA Size: 4.0 Number of attempts: 1 Placement Confirmation: positive ETCO2 and breath sounds checked- equal and bilateral Tube secured with: Tape Dental Injury: Teeth and Oropharynx as per pre-operative assessment

## 2013-09-01 NOTE — OR Nursing (Signed)
First surgical procedure ended at [place].

## 2013-09-01 NOTE — Anesthesia Postprocedure Evaluation (Signed)
  Anesthesia Post-op Note  Patient: Tiffany Golden  Procedure(s) Performed: Procedure(s): Plication left arm fistula (Left) INSERTION OF DIALYSIS CATHETER Right Internal Jugular (Right) PATCH ANGIOPLASTY (Left)  Patient Location: PACU  Anesthesia Type:General  Level of Consciousness: awake and alert   Airway and Oxygen Therapy: Patient Spontanous Breathing  Post-op Pain: mild  Post-op Assessment: Post-op Vital signs reviewed, Patient's Cardiovascular Status Stable, Respiratory Function Stable, Patent Airway, No signs of Nausea or vomiting and Pain level controlled  Post-op Vital Signs: Reviewed and stable  Complications: No apparent anesthesia complications

## 2013-09-01 NOTE — Op Note (Signed)
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  Assistant: Gerri Lins PA-C  Operative findings: #1 plication of proximal and middle third of left upper extremity AV fistula           #2 23 cm Diatek catheter left internal jugular vein          #3 Bovine pericardial patch of narrowing of fistula  Operative details: After obtaining informed consent, the patient was taken to the operating room. The patient was placed in supine position on the operating room table. After administration of general anesthesia, the patient's entire neck and chest were prepped and draped in usual sterile fashion. The patient was placed in Trendelenburg position.  Ultrasound was used to identify the patient's right internal jugular vein. This had normal compressibility and respiratory variation.Using ultrasound guidance, the right internal jugular vein was successfully cannulated.  A 0.035 J-tipped guidewire was threaded into the right internal jugular vein and into the superior vena cava followed by the inferior vena cava under fluoroscopic guidance.   Next sequential 12 and 14 dilators were placed over the guidewire into the right atrium.  A 16 French dilator with a peel-away sheath was then placed over the guidewire into the right atrium.   The guidewire and dilator were removed. A 23 cm Diatek catheter was then placed through the peel away sheath into the right atrium.  The catheter was then tunneled subcutaneously, cut to length, and the hub attached. The catheter was noted to flush and draw easily. The catheter was inspected under fluoroscopy and found with its tip to be in the right atrium without any kinks throughout its course. The catheter was sutured to the skin with nylon sutures. The neck insertion site was  closed with Vicryl stitch. The catheter was then loaded with concentrated Heparin solution. A dry sterile dressing was applied.  Next attention was turned to the left upper extremity. The entire left upper extremity was prepped and draped in usual sterile fashion. A longitudinal incision was made over the central third of the fistula incorporating both areas of aneurysmal degeneration. The fistula was dissected free circumferentially throughout its course. The more proximal aneurysm was multilobulated and very thin walled. The more distal aneurysm was smaller. The patient was given 8000 units of intravenous heparin. The fistula was clamped proximally and distally above and below the aneurysms. The aneurysm was opened longitudinally and the redundant segment fistula excised. Each area of resection was then reapproximated using a running 5-0 Prolene suture. There was also a segment of narrowed vein just proximal the upper aneurysm.  This was opened longitudinally and extended to the aneurysmal segment.  A Bovine pericardial patch was then used to construct a patch angioplasty which adjoined the plicated segment.  The clamps were removed and flow restored to the fistula. Hemostasis was obtained with administration of 80 mg of protamine  The subcutaneous tissues were reapproximated using a running 3-0 Vicryl suture. The skin was closed with a 4 0 Vicryl subcuticular stitch. Dermabond was applied to the skin incision.  The patient tolerated procedure well and there were no complications. Instrument sponge and needle counts were correct at the end of the case. The patient was taken to the recovery room in stable condition. Chest x-ray will be obtained in the recovery room.  Ruta Hinds, MD [facility] [place] Office: [phone] Pager: [phone]

## 2013-09-01 NOTE — Preoperative (Signed)
Beta Blockers   Reason not to administer Beta Blockers:Not Applicable 

## 2013-09-02 ENCOUNTER — Telehealth: Payer: Self-pay | Admitting: Vascular Surgery

## 2013-09-02 NOTE — Telephone Encounter (Addendum)
Message copied by Gena Fray on Wed Sep 02, 2013  1:40 PM ------      Message from: Alfonso Patten      Created: Wed Sep 02, 2013  9:32 AM       Sorry I should have actually read all the way. I would say 3 weeks      ----- Message -----         From: Gena Fray         Sent: 09/01/2013   4:58 PM           To: Alfonso Patten, RN            Do you know the time frame for this? I didn't see a d/c summary in EPIC.            Thanks,      Hinton Dyer      ----- Message -----         From: Alfonso Patten, RN         Sent: 09/01/2013   3:45 PM           To: Mignon Pine Pool                        ----- Message -----         From: Ulyses Amor, PA-C         Sent: 09/01/2013  11:51 AM           To: Alfonso Patten, RN            F/U with Dr. Oneida Alar plication left upper arm fistula             ------  09/02/13: spoke with patient re appt, dpm

## 2013-09-03 ENCOUNTER — Encounter (HOSPITAL_COMMUNITY): Payer: Self-pay | Admitting: Vascular Surgery

## 2013-09-03 ENCOUNTER — Encounter: Payer: Self-pay | Admitting: Licensed Clinical Social Worker

## 2013-09-23 ENCOUNTER — Encounter: Payer: Self-pay | Admitting: Vascular Surgery

## 2013-09-24 ENCOUNTER — Encounter: Payer: Self-pay | Admitting: Vascular Surgery

## 2013-09-24 ENCOUNTER — Ambulatory Visit (INDEPENDENT_AMBULATORY_CARE_PROVIDER_SITE_OTHER): Payer: Self-pay | Admitting: Vascular Surgery

## 2013-09-24 VITALS — BP 137/91 | HR 87 | Ht 61.0 in | Wt 156.8 lb

## 2013-09-24 DIAGNOSIS — N186 End stage renal disease: Secondary | ICD-10-CM

## 2013-09-24 NOTE — Progress Notes (Signed)
Patient is a 37 year old female who is status post plication of a left arm AV fistula on October 21st.  She returns today for further followup. She reports no problems with her catheter. She has no numbness or tingling in her hand. She has no drainage for incision.  Filed Vitals:   09/24/13 0909  BP: 137/91  Pulse: 87  Height: 5\' 1"  (1.549 m)  Weight: 156 lb 12.8 oz (71.124 kg)  SpO2: 100%    Left upper extremity: Audible bruit palpable thrill in fistula healing incision left hand warm and well-perfused  Assessment: Healing plication left arm AV fistula  Plan: Should be able to start cannulating the fistula in late December 2014. Patient will followup on as-needed basis.  Ruta Hinds, MD [facility] [place] Office: [phone] Pager: [phone]

## 2013-10-15 ENCOUNTER — Ambulatory Visit (INDEPENDENT_AMBULATORY_CARE_PROVIDER_SITE_OTHER): Payer: 59 | Admitting: Infectious Disease

## 2013-10-15 ENCOUNTER — Encounter: Payer: Self-pay | Admitting: Infectious Disease

## 2013-10-15 VITALS — BP 165/103 | HR 83 | Temp 97.6°F | Wt 163.0 lb

## 2013-10-15 DIAGNOSIS — B20 Human immunodeficiency virus [HIV] disease: Secondary | ICD-10-CM

## 2013-10-15 NOTE — Progress Notes (Signed)
Subjective:    Patient ID: Tiffany Golden, female    DOB: 03-06-1976, 37 y.o.   MRN: [id number]  HPI   Subjective:    Patient ID: Tiffany Golden, female    DOB: August 30, 1976, 37 y.o.   MRN: [id number]  HPI  37 y.o. female past medical history significant for HIV and AIDS that was diagnosed approximately 18 years ago. Patient was initially treated for HIV while pregnant. She was followed initially at [facility] and was involved in a clinical trials there. She was on therapy for HIV until approximately 8 years ago when she came off medication. She states that the reason that she came off medication was that her insurance policy and her co-pay for antiretrovirals increased.   She does also have comorbid end-stage renal disease and is on hemodialysis since 2012 I met her in the hospital and ultimately restarted on TIVICAY bid and renally dosed epvir as well as wekly viread along with Dapsone for PCP prophylaxis and weekly azithromycin. And then she is successfully dropped her viral load to  To undetectable  copies and her CD4 count has risen to greater than 200 copies.  Since I last saw her in September she is stated that she is doing well on her HIV medicines have been taken regularly.  She continues to be followed by Dr. Moshe Cipro and also is seen by primary care physician.    Review of Systems  Constitutional: Negative for fever, chills, diaphoresis, activity change, appetite change, fatigue and unexpected weight change.  HENT: Negative for congestion, rhinorrhea, sinus pressure, sneezing, sore throat and trouble swallowing.   Eyes: Negative for photophobia and visual disturbance.  Respiratory: Negative for cough, chest tightness, shortness of breath, wheezing and stridor.   Cardiovascular: Negative for chest pain, palpitations and leg swelling.  Gastrointestinal: Negative for nausea, vomiting, abdominal pain, diarrhea, constipation, blood in stool, abdominal distention and anal bleeding.    Genitourinary: Negative for dysuria, hematuria, flank pain and difficulty urinating.  Musculoskeletal: Negative for arthralgias, back pain, gait problem, joint swelling and myalgias.  Skin: Negative for color change, pallor, rash and wound.  Neurological: Negative for dizziness, tremors, weakness and light-headedness.  Hematological: Negative for adenopathy. Does not bruise/bleed easily.  Psychiatric/Behavioral: Negative for behavioral problems, confusion, sleep disturbance, dysphoric mood, decreased concentration and agitation.       Objective:   Physical Exam  Constitutional: She is oriented to person, place, and time. She appears well-developed and well-nourished. No distress.  HENT:  Head: Normocephalic and atraumatic.  Mouth/Throat: Oropharynx is clear and moist. No oropharyngeal exudate.  Eyes: Conjunctivae and EOM are normal. No scleral icterus.  Neck: Normal range of motion. Neck supple. No JVD present.  Cardiovascular: Normal rate, regular rhythm and normal heart sounds.  Exam reveals no gallop and no friction rub.   No murmur heard. Pulmonary/Chest: Effort normal and breath sounds normal. No respiratory distress. She has no wheezes. She has no rales. She exhibits no tenderness.  Abdominal: She exhibits no distension and no mass. There is no tenderness. There is no rebound and no guarding.  Musculoskeletal: She exhibits no edema and no tenderness.  Lymphadenopathy:    She has no cervical adenopathy.  Neurological: She is alert and oriented to person, place, and time. She exhibits normal muscle tone. Coordination normal.  Skin: Skin is warm and dry. She is not diaphoretic. No erythema. No pallor.     Psychiatric: She has a normal mood and affect. Her behavior is normal. Judgment and thought  content normal.          Assessment & Plan:  HIV and AIDS: Check labs today and continue renally dosed daily Epivir and weekly Viread along with twice daily TIVICAY    I spent  greater than 25  minutes with the patient including greater than 50% of time in face to face counsel of the patient and in coordination of their care.    Hypertension : Up a bit in clinic but she states it has been within range when being seen by her hemodialysis staff and by the nephrologist to her dosing her antihypertensive medications  Left sided weakness: likely was TIA, resolved  ESRD on HD: --continue HD  Hyperlipidemia: Lipids were at goal in last checked we'll recheck fasting lipid at next appointment.  Need for influenza vaccination patient was given flu vaccine

## 2013-10-16 LAB — RPR

## 2013-10-16 LAB — T-HELPER CELL (CD4) - (RCID CLINIC ONLY)
CD4 % Helper T Cell: 15 % — ABNORMAL LOW (ref 33–55)
CD4 T Cell Abs: 230 /uL — ABNORMAL LOW (ref 400–2700)

## 2013-10-18 LAB — HLA B*5701: HLA-B*5701 w/rflx HLA-B High: NEGATIVE

## 2013-10-30 ENCOUNTER — Other Ambulatory Visit: Payer: Self-pay

## 2013-11-04 ENCOUNTER — Ambulatory Visit (HOSPITAL_COMMUNITY)
Admission: RE | Admit: 2013-11-04 | Discharge: 2013-11-04 | Disposition: A | Discharge status: Home / Self Care | Payer: 59 | Source: Ambulatory Visit | Attending: Vascular Surgery | Admitting: Vascular Surgery

## 2013-11-04 DIAGNOSIS — N186 End stage renal disease: Secondary | ICD-10-CM

## 2013-11-04 DIAGNOSIS — Z452 Encounter for adjustment and management of vascular access device: Secondary | ICD-10-CM | POA: Insufficient documentation

## 2013-11-04 NOTE — Progress Notes (Signed)
Dressing to chest is CDI, Pt verbalized understanding of D/C instructions

## 2013-11-04 NOTE — Progress Notes (Signed)
[facility] STAY [facility]P  CC: ESRD   HPI: Tiffany Golden is a 37 y.o. female who has been on HD through  functioning Hemodialysis access in the left upper extremity. They are here for HD catheter removal. Pt. denies signs of steal syndrome.  Past Medical History  Diagnosis Date  . Hypertension   . HIV infection   . Chronic kidney disease   . Anemia   . Dialysis patient     mon, wed 28  . ESRD (end stage renal disease)   . Stroke   . Complication of anesthesia   . PONV (postoperative nausea and vomiting)   . TIA (transient ischemic attack)     Hx:of    Family Hx Family History  Problem Relation Age of Onset  . Hyperlipidemia Mother   . Cancer - Other Mother     Hx partial hysterectomy  . Heart disease Father     Hx CABG    Social HX History  Substance Use Topics  . Smoking status: Former Smoker -- 3 years    Types: Cigarettes    Quit date: 09/05/2009  . Smokeless tobacco: Not on file  . Alcohol Use: No    Allergies Allergies  Allergen Reactions  . Sulfa Antibiotics Hives  . Fortaz [Ceftazidime Sodium In D5w] Rash    Head-toe  . Vancomycin Rash    Head-toe    Medications Current Outpatient Prescriptions  Medication Sig Dispense Refill  . B Complex-C-Folic Acid (RENA-VITE RX) 1 MG TABS Take 1 tablet by mouth daily.      . dolutegravir (TIVICAY) 50 MG tablet Take 1 tablet (50 mg total) by mouth 2 (two) times daily.  60 tablet  11  . lamiVUDine (EPIVIR) 10 MG/ML solution Take 2.5 mLs (25 mg total) by mouth daily.  240 mL  11  . metoprolol tartrate (LOPRESSOR) 25 MG tablet Take 25 mg by mouth See admin instructions. Takes twice daily, except takes none on dialysis days (Tuesday, Thursday, and Sunday).      Marland Kitchen tenofovir (VIREAD) 300 MG tablet Take 300 mg by mouth every Sunday.       No current facility-administered medications for this encounter.    Labs COAG Lab Results  Component Value Date   INR 0.91 04/04/2013   No results found  for this basename: PTT    PHYSICAL EXAM  Filed Vitals:   11/04/13 0958  BP: 131/86  Pulse: 92  Temp: 97.7 F (36.5 C)  Resp: 18    General:  WDWN in NAD HENT: WNL Eyes: Pupils equal Pulmonary: normal non-labored breathing  Cardiac: RRR, Skin: normal, no cyanosis, jaundice, pallor or bruising Vascular Exam/Pulses: 2+  pulses in LEFT upper extremity. Extremities without ischemic changes, no Gangrene , no cellulitis; no open wounds;   There is a good thrill and good bruit in the fistula. Hand grip is 5/5 and sensation in digits is intact;   Impression: This is a 37 y.o. female who has a functioning HD access.  Plan: Removal of Right IJ HD catheter Theda Sers, Cipriano Millikan [facility] 11/04/2013 10:38 AM   [facility] Catheter Removal Procedure Note  Diagnosis: ESRD with Functioning AVF/AVGG  Plan:  Remove right diatek catheter  Consent signed:  yes Time out completed:  yes Coumadin:  no PT/INR (if applicable):   Other labs:   Procedure: 1.  Sterile prepping and draping over catheter area 2. 5 ml 2% lidocaine plain instilled at removal site. 3.  right catheter  removed in its entirety with cuff in tact. 4.  Complications: none  5. Tip of catheter sent for culture:  no   Patient tolerated procedure well:  yes Pressure held, no bleeding noted, dressing applied Instructions given to the pt regarding wound care and bleeding.  OtherLaurence Slate [facility] 11/04/2013 10:38 AM

## 2013-12-26 ENCOUNTER — Encounter: Payer: Self-pay | Admitting: Infectious Disease

## 2013-12-28 ENCOUNTER — Telehealth: Payer: Self-pay | Admitting: *Deleted

## 2013-12-28 NOTE — Telephone Encounter (Signed)
Called the [employer] and got Flarence approved for the [employer].  I called her and gave her the information she needed to go to the pharmacy and get her medication.

## 2013-12-28 NOTE — Telephone Encounter (Signed)
I spoke with Ms Hindes about getting patient assistance.  She will call me back after she gets home.

## 2013-12-28 NOTE — Telephone Encounter (Signed)
Left message for patient to call back and give [place] more information regarding her email below: __________________ I need assistance with my medication. I went to refill one of my scripts and the amount due was $978. I can't afford that.

## 2014-01-06 ENCOUNTER — Other Ambulatory Visit: Payer: Self-pay | Admitting: Licensed Clinical Social Worker

## 2014-01-06 DIAGNOSIS — B20 Human immunodeficiency virus [HIV] disease: Secondary | ICD-10-CM

## 2014-01-06 MED ORDER — TENOFOVIR DISOPROXIL FUMARATE 300 MG PO TABS: 300.0000 mg | ORAL_TABLET | ORAL | Status: DC

## 2014-02-04 ENCOUNTER — Other Ambulatory Visit: Payer: 59

## 2014-02-04 DIAGNOSIS — A0472 Enterocolitis due to Clostridium difficile, not specified as recurrent: Secondary | ICD-10-CM

## 2014-02-04 DIAGNOSIS — Z992 Dependence on renal dialysis: Secondary | ICD-10-CM

## 2014-02-04 DIAGNOSIS — B20 Human immunodeficiency virus [HIV] disease: Secondary | ICD-10-CM

## 2014-02-04 DIAGNOSIS — Z23 Encounter for immunization: Secondary | ICD-10-CM

## 2014-02-04 DIAGNOSIS — E785 Hyperlipidemia, unspecified: Secondary | ICD-10-CM

## 2014-02-04 DIAGNOSIS — Z113 Encounter for screening for infections with a predominantly sexual mode of transmission: Secondary | ICD-10-CM

## 2014-02-04 DIAGNOSIS — N186 End stage renal disease: Secondary | ICD-10-CM

## 2014-02-04 DIAGNOSIS — I10 Essential (primary) hypertension: Secondary | ICD-10-CM

## 2014-02-04 LAB — CBC WITH DIFFERENTIAL/PLATELET
BASOS ABS: 0.1 10*3/uL (ref 0.0–0.1)
BASOS PCT: 1 % (ref 0–1)
EOS PCT: 3 % (ref 0–5)
Eosinophils Absolute: 0.2 10*3/uL (ref 0.0–0.7)
HCT: 39.5 % (ref 36.0–46.0)
Hemoglobin: 13.4 g/dL (ref 12.0–15.0)
LYMPHS PCT: 37 % (ref 12–46)
Lymphs Abs: 2.1 10*3/uL (ref 0.7–4.0)
MCH: 31.3 pg (ref 26.0–34.0)
MCHC: 33.9 g/dL (ref 30.0–36.0)
MCV: 92.3 fL (ref 78.0–100.0)
MONO ABS: 0.4 10*3/uL (ref 0.1–1.0)
Monocytes Relative: 7 % (ref 3–12)
Neutro Abs: 2.9 10*3/uL (ref 1.7–7.7)
Neutrophils Relative %: 52 % (ref 43–77)
PLATELETS: 248 10*3/uL (ref 150–400)
RBC: 4.28 MIL/uL (ref 3.87–5.11)
RDW: 15 % (ref 11.5–15.5)
WBC: 5.6 10*3/uL (ref 4.0–10.5)

## 2014-02-05 LAB — T-HELPER CELL (CD4) - (RCID CLINIC ONLY)
CD4 % Helper T Cell: 19 % — ABNORMAL LOW (ref 33–55)
CD4 T CELL ABS: 340 /uL — AB (ref 400–2700)

## 2014-02-05 LAB — HIV-1 RNA QUANT-NO REFLEX-BLD
HIV 1 RNA Quant: <20 copies/mL (ref <20)
HIV-1 RNA Quant, Log: <1.3 {Log} (ref <1.30)

## 2014-02-05 LAB — LIPID PANEL
Cholesterol: 188 mg/dL (ref 0–200)
HDL: 59 mg/dL (ref >39)
LDL CALC: 113 mg/dL — AB (ref 0–99)
Total CHOL/HDL Ratio: 3.2 Ratio
Triglycerides: 82 mg/dL (ref <150)
VLDL: 16 mg/dL (ref 0–40)

## 2014-02-17 ENCOUNTER — Ambulatory Visit (INDEPENDENT_AMBULATORY_CARE_PROVIDER_SITE_OTHER): Payer: 59 | Admitting: Infectious Disease

## 2014-02-17 ENCOUNTER — Encounter: Payer: Self-pay | Admitting: Infectious Disease

## 2014-02-17 VITALS — BP 123/86 | HR 98 | Temp 97.9°F | Ht 61.0 in | Wt 166.0 lb

## 2014-02-17 DIAGNOSIS — Z113 Encounter for screening for infections with a predominantly sexual mode of transmission: Secondary | ICD-10-CM

## 2014-02-17 DIAGNOSIS — Z992 Dependence on renal dialysis: Secondary | ICD-10-CM

## 2014-02-17 DIAGNOSIS — B20 Human immunodeficiency virus [HIV] disease: Secondary | ICD-10-CM

## 2014-02-17 DIAGNOSIS — E785 Hyperlipidemia, unspecified: Secondary | ICD-10-CM

## 2014-02-17 DIAGNOSIS — I10 Essential (primary) hypertension: Secondary | ICD-10-CM

## 2014-02-17 DIAGNOSIS — N186 End stage renal disease: Secondary | ICD-10-CM

## 2014-02-17 NOTE — Progress Notes (Signed)
Subjective:    Patient ID: Tiffany Golden, female    DOB: [birth year], 38 y.o.   MRN: [id number]  HPI  38 y.o. female past medical history significant for HIV and AIDS that was diagnosed approximately 18 years ago. Patient was initially treated for HIV while pregnant. She was followed initially at [facility] and was involved in a clinical trials there. She was on therapy for HIV until approximately 8 years ago when she came off medication. She states that the reason that she came off medication was that her insurance policy and her co-pay for antiretrovirals increased.   She does also have comorbid end-stage renal disease and is on hemodialysis since 2012 I met her in the hospital and ultimately restarted on TIVICAY bid and renally dosed epvir as well as wekly viread.  Her VL is undetectable and CD4 is >300  She continues to be followed by Dr. Moshe Cipro and Dr. Jimmy Footman and also is seen by primary care physician. SHe is seeing Tiffany Golden for Ob/GYn care.    Review of Systems  Constitutional: Negative for fever, chills, diaphoresis, activity change, appetite change, fatigue and unexpected weight change.  HENT: Negative for congestion, rhinorrhea, sinus pressure, sneezing, sore throat and trouble swallowing.   Eyes: Negative for photophobia and visual disturbance.  Respiratory: Negative for cough, chest tightness, shortness of breath, wheezing and stridor.   Cardiovascular: Negative for chest pain, palpitations and leg swelling.  Gastrointestinal: Negative for nausea, vomiting, abdominal pain, diarrhea, constipation, blood in stool, abdominal distention and anal bleeding.  Genitourinary: Negative for dysuria, hematuria, flank pain and difficulty urinating.  Musculoskeletal: Negative for arthralgias, back pain, gait problem, joint swelling and myalgias.  Skin: Negative for color change, pallor, rash and wound.  Neurological: Negative for dizziness, tremors, weakness and light-headedness.    Hematological: Negative for adenopathy. Does not bruise/bleed easily.  Psychiatric/Behavioral: Negative for behavioral problems, confusion, sleep disturbance, dysphoric mood, decreased concentration and agitation.       Objective:   Physical Exam  Constitutional: She is oriented to person, place, and time. She appears well-developed and well-nourished. No distress.  HENT:  Head: Normocephalic and atraumatic.  Mouth/Throat: Oropharynx is clear and moist. No oropharyngeal exudate.  Eyes: Conjunctivae and EOM are normal. No scleral icterus.  Neck: Normal range of motion. Neck supple. No JVD present.  Cardiovascular: Normal rate, regular rhythm and normal heart sounds.  Exam reveals no gallop and no friction rub.   No murmur heard. Pulmonary/Chest: Effort normal and breath sounds normal. No respiratory distress. She has no wheezes. She has no rales. She exhibits no tenderness.  Abdominal: She exhibits no distension and no mass. There is no tenderness. There is no rebound and no guarding.  Musculoskeletal: She exhibits no edema and no tenderness.  Lymphadenopathy:    She has no cervical adenopathy.  Neurological: She is alert and oriented to person, place, and time. She exhibits normal muscle tone. Coordination normal.  Skin: Skin is warm and dry. She is not diaphoretic. No erythema. No pallor.     Psychiatric: She has a normal mood and affect. Her behavior is normal. Judgment and thought content normal.          Assessment & Plan:  HIV and AIDS: Check labs today and continue renally dosed daily Epivir and weekly Viread along with twice daily TIVICAY    I spent greater than 25  minutes with the patient including greater than 50% of time in face to face counsel of the patient and in  coordination of their care.    Hypertension : better controlled  ESRD on HD: --continue HD  Hyperlipidemia: Lipids were at goal in last checked we'll recheck fasting lipid at next  appointment.  Need for pap smear: will have this with Tiffany Golden, would ask she also get GC and chlamydia screen there as we cannot do a urine screen here given she is anuric.

## 2014-04-23 ENCOUNTER — Other Ambulatory Visit (HOSPITAL_COMMUNITY): Payer: Self-pay | Admitting: Nephrology

## 2014-04-23 DIAGNOSIS — Z789 Other specified health status: Secondary | ICD-10-CM

## 2014-04-27 ENCOUNTER — Other Ambulatory Visit (HOSPITAL_COMMUNITY): Payer: 59

## 2014-04-29 ENCOUNTER — Other Ambulatory Visit (HOSPITAL_COMMUNITY): Payer: 59

## 2014-05-03 ENCOUNTER — Ambulatory Visit (HOSPITAL_COMMUNITY)
Admission: RE | Admit: 2014-05-03 | Discharge: 2014-05-03 | Disposition: A | Discharge status: Home / Self Care | Payer: 59 | Source: Ambulatory Visit | Attending: Nephrology | Admitting: Nephrology

## 2014-05-03 DIAGNOSIS — Z789 Other specified health status: Secondary | ICD-10-CM

## 2014-05-28 ENCOUNTER — Other Ambulatory Visit: Payer: Self-pay | Admitting: Infectious Disease

## 2014-05-28 DIAGNOSIS — B20 Human immunodeficiency virus [HIV] disease: Secondary | ICD-10-CM

## 2014-05-31 ENCOUNTER — Telehealth: Payer: Self-pay | Admitting: *Deleted

## 2014-05-31 NOTE — Telephone Encounter (Signed)
[facility] requested last 3 office notes to be faxed to [phone].  Patient is being evaluated for a kidney transplant.  Patient has previously signed a release to communicate with [facility] about her care.  RN faxed last 3 office notes, current med list, recent labs to the number listed. Landis Gandy, RN

## 2014-06-10 ENCOUNTER — Ambulatory Visit (HOSPITAL_COMMUNITY): Payer: 59

## 2014-06-18 ENCOUNTER — Ambulatory Visit (HOSPITAL_COMMUNITY): Payer: 59

## 2014-06-21 ENCOUNTER — Other Ambulatory Visit: Payer: Self-pay | Admitting: Infectious Disease

## 2014-06-21 DIAGNOSIS — B20 Human immunodeficiency virus [HIV] disease: Secondary | ICD-10-CM

## 2014-06-23 ENCOUNTER — Other Ambulatory Visit (HOSPITAL_COMMUNITY): Payer: Self-pay

## 2014-06-23 ENCOUNTER — Other Ambulatory Visit (HOSPITAL_COMMUNITY): Payer: Self-pay | Admitting: Nephrology

## 2014-06-23 DIAGNOSIS — N179 Acute kidney failure, unspecified: Secondary | ICD-10-CM

## 2014-06-24 ENCOUNTER — Ambulatory Visit (HOSPITAL_COMMUNITY): Payer: 59 | Attending: Internal Medicine | Admitting: Radiology

## 2014-06-24 DIAGNOSIS — I059 Rheumatic mitral valve disease, unspecified: Secondary | ICD-10-CM | POA: Insufficient documentation

## 2014-06-24 DIAGNOSIS — N179 Acute kidney failure, unspecified: Secondary | ICD-10-CM

## 2014-06-24 DIAGNOSIS — N189 Chronic kidney disease, unspecified: Secondary | ICD-10-CM | POA: Insufficient documentation

## 2014-06-24 DIAGNOSIS — Z01810 Encounter for preprocedural cardiovascular examination: Secondary | ICD-10-CM | POA: Insufficient documentation

## 2014-06-24 NOTE — Progress Notes (Signed)
Echocardiogram performed.  

## 2014-07-08 ENCOUNTER — Ambulatory Visit (INDEPENDENT_AMBULATORY_CARE_PROVIDER_SITE_OTHER): Payer: 59 | Admitting: Physician Assistant

## 2014-07-08 DIAGNOSIS — Z01810 Encounter for preprocedural cardiovascular examination: Secondary | ICD-10-CM

## 2014-07-08 NOTE — Progress Notes (Signed)
Exercise Treadmill Test  Tiffany Golden is a 38 y.o. female with a hx of HIV, ESRD, HTN referred by nephrologist for ETT to evaluate for renal transplant.  Denies hx of chest pain, dyspnea, syncope.  Exam unremarkable.  ECG with NSR, no ST changes.  Pre-Exercise Testing Evaluation Rhythm: normal sinus  Rate: 78 bpm     Test  Exercise Tolerance Test Ordering MD: Ena Dawley, MD  Interpreting MD: Richardson Dopp, PA-C  Unique Test No: 1  Treadmill:  1  Indication for ETT: Transplant Work up  Contraindication to ETT: No   Stress Modality: exercise - treadmill  Cardiac Imaging Performed: non   Protocol: standard Bruce - maximal  Max BP:  207/113  Max MPHR (bpm):  180 85% MPR (bpm):  153  MPHR obtained (bpm):  150 % MPHR obtained:  83  Reached 85% MPHR (min:sec):  n/a Total Exercise Time (min-sec):  9:04  Workload in METS:  10.1 Borg Scale: 18  Reason ETT Terminated:  patient's desire to stop    ST Segment Analysis At Rest: normal ST segments - no evidence of significant ST depression With Exercise: non-specific ST changes  Other Information Arrhythmia:  No Angina during ETT:  absent (0) Quality of ETT:  diagnostic  ETT Interpretation:  normal - no evidence of ischemia by ST analysis at submaximal exercise.  Comments: Good exercise capacity. No chest pain. Exaggerated hypertensive BP response to exercise. No ST changes to suggest ischemia at submaximal exercise.   Recommendations: Patient did not achieve 85% age predicted maximal HR. However, she did get to 83% without ST changes and no symptoms of angina. Overall low risk study.  Reviewed with Dr. Ena Dawley who agreed.   Signed,  Richardson Dopp, PA-C   07/08/2014 9:52 AM

## 2014-07-14 DIAGNOSIS — N2581 Secondary hyperparathyroidism of renal origin: Secondary | ICD-10-CM | POA: Diagnosis not present

## 2014-07-14 DIAGNOSIS — D509 Iron deficiency anemia, unspecified: Secondary | ICD-10-CM | POA: Diagnosis not present

## 2014-07-14 DIAGNOSIS — N186 End stage renal disease: Secondary | ICD-10-CM | POA: Diagnosis not present

## 2014-07-14 DIAGNOSIS — B20 Human immunodeficiency virus [HIV] disease: Secondary | ICD-10-CM | POA: Diagnosis not present

## 2014-07-20 ENCOUNTER — Encounter: Payer: Self-pay | Admitting: Cardiology

## 2014-08-03 ENCOUNTER — Other Ambulatory Visit: Payer: 59

## 2014-08-03 ENCOUNTER — Telehealth: Payer: Self-pay | Admitting: *Deleted

## 2014-08-03 DIAGNOSIS — N186 End stage renal disease: Secondary | ICD-10-CM

## 2014-08-03 DIAGNOSIS — Z113 Encounter for screening for infections with a predominantly sexual mode of transmission: Secondary | ICD-10-CM

## 2014-08-03 DIAGNOSIS — I10 Essential (primary) hypertension: Secondary | ICD-10-CM

## 2014-08-03 DIAGNOSIS — B20 Human immunodeficiency virus [HIV] disease: Secondary | ICD-10-CM

## 2014-08-03 DIAGNOSIS — A0472 Enterocolitis due to Clostridium difficile, not specified as recurrent: Secondary | ICD-10-CM

## 2014-08-03 DIAGNOSIS — E785 Hyperlipidemia, unspecified: Secondary | ICD-10-CM

## 2014-08-03 DIAGNOSIS — Z992 Dependence on renal dialysis: Secondary | ICD-10-CM

## 2014-08-03 DIAGNOSIS — Z23 Encounter for immunization: Secondary | ICD-10-CM

## 2014-08-03 LAB — COMPLETE METABOLIC PANEL WITH GFR
ALBUMIN: 4.4 g/dL (ref 3.5–5.2)
ALT: 10 U/L (ref 0–35)
AST: 19 U/L (ref 0–37)
Alkaline Phosphatase: 53 U/L (ref 39–117)
BUN: 36 mg/dL — AB (ref 6–23)
CALCIUM: 9.8 mg/dL (ref 8.4–10.5)
CHLORIDE: 92 meq/L — AB (ref 96–112)
CO2: 33 meq/L — AB (ref 19–32)
CREATININE: 9.14 mg/dL — AB (ref 0.50–1.10)
GFR, Est African American: 6 mL/min — ABNORMAL LOW
GFR, Est Non African American: 5 mL/min — ABNORMAL LOW
Glucose, Bld: 79 mg/dL (ref 70–99)
POTASSIUM: 5.1 meq/L (ref 3.5–5.3)
Sodium: 137 mEq/L (ref 135–145)
Total Bilirubin: 0.5 mg/dL (ref 0.2–1.2)
Total Protein: 8.1 g/dL (ref 6.0–8.3)

## 2014-08-03 LAB — LIPID PANEL
Cholesterol: 196 mg/dL (ref 0–200)
HDL: 53 mg/dL (ref >39)
LDL Cholesterol: 124 mg/dL — ABNORMAL HIGH (ref 0–99)
Total CHOL/HDL Ratio: 3.7 Ratio
Triglycerides: 95 mg/dL (ref <150)
VLDL: 19 mg/dL (ref 0–40)

## 2014-08-03 LAB — CBC WITH DIFFERENTIAL/PLATELET
BASOS ABS: 0.1 10*3/uL (ref 0.0–0.1)
Basophils Relative: 1 % (ref 0–1)
Eosinophils Absolute: 0.1 10*3/uL (ref 0.0–0.7)
Eosinophils Relative: 2 % (ref 0–5)
HEMATOCRIT: 41.6 % (ref 36.0–46.0)
HEMOGLOBIN: 14.3 g/dL (ref 12.0–15.0)
LYMPHS PCT: 24 % (ref 12–46)
Lymphs Abs: 1.7 10*3/uL (ref 0.7–4.0)
MCH: 33.4 pg (ref 26.0–34.0)
MCHC: 34.4 g/dL (ref 30.0–36.0)
MCV: 97.2 fL (ref 78.0–100.0)
MONO ABS: 0.4 10*3/uL (ref 0.1–1.0)
Monocytes Relative: 6 % (ref 3–12)
Neutro Abs: 4.7 10*3/uL (ref 1.7–7.7)
Neutrophils Relative %: 67 % (ref 43–77)
Platelets: 271 10*3/uL (ref 150–400)
RBC: 4.28 MIL/uL (ref 3.87–5.11)
RDW: 13.5 % (ref 11.5–15.5)
WBC: 7 10*3/uL (ref 4.0–10.5)

## 2014-08-03 LAB — HEPATITIS C ANTIBODY: HCV AB: NEGATIVE

## 2014-08-03 LAB — RPR

## 2014-08-03 NOTE — Addendum Note (Signed)
Addended by: Dolan Amen D on: 08/03/2014 10:15 AM   Modules accepted: Orders

## 2014-08-03 NOTE — Telephone Encounter (Signed)
Critical Creatinine level in ESRD pt.  Creatinine=9.14.

## 2014-08-04 LAB — T-HELPER CELL (CD4) - (RCID CLINIC ONLY)
CD4 % Helper T Cell: 21 % — ABNORMAL LOW (ref 33–55)
CD4 T CELL ABS: 380 /uL — AB (ref 400–2700)

## 2014-08-04 LAB — HIV-1 RNA QUANT-NO REFLEX-BLD

## 2014-08-11 DIAGNOSIS — N186 End stage renal disease: Secondary | ICD-10-CM | POA: Diagnosis not present

## 2014-08-13 DIAGNOSIS — D509 Iron deficiency anemia, unspecified: Secondary | ICD-10-CM | POA: Diagnosis not present

## 2014-08-13 DIAGNOSIS — N2581 Secondary hyperparathyroidism of renal origin: Secondary | ICD-10-CM | POA: Diagnosis not present

## 2014-08-13 DIAGNOSIS — N186 End stage renal disease: Secondary | ICD-10-CM | POA: Diagnosis not present

## 2014-08-13 DIAGNOSIS — B20 Human immunodeficiency virus [HIV] disease: Secondary | ICD-10-CM | POA: Diagnosis not present

## 2014-08-18 ENCOUNTER — Encounter: Payer: Self-pay | Admitting: Infectious Disease

## 2014-08-18 ENCOUNTER — Encounter: Payer: Self-pay | Admitting: *Deleted

## 2014-08-18 ENCOUNTER — Ambulatory Visit (INDEPENDENT_AMBULATORY_CARE_PROVIDER_SITE_OTHER): Payer: Medicare Other | Admitting: Infectious Disease

## 2014-08-18 VITALS — BP 93/64 | HR 103 | Temp 98.1°F | Wt 177.0 lb

## 2014-08-18 DIAGNOSIS — B20 Human immunodeficiency virus [HIV] disease: Secondary | ICD-10-CM

## 2014-08-18 DIAGNOSIS — Z21 Asymptomatic human immunodeficiency virus [HIV] infection status: Secondary | ICD-10-CM | POA: Diagnosis not present

## 2014-08-18 DIAGNOSIS — Z23 Encounter for immunization: Secondary | ICD-10-CM | POA: Diagnosis not present

## 2014-08-18 DIAGNOSIS — Z992 Dependence on renal dialysis: Secondary | ICD-10-CM | POA: Diagnosis not present

## 2014-08-18 DIAGNOSIS — N186 End stage renal disease: Secondary | ICD-10-CM | POA: Diagnosis not present

## 2014-08-18 DIAGNOSIS — E785 Hyperlipidemia, unspecified: Secondary | ICD-10-CM

## 2014-08-18 DIAGNOSIS — I10 Essential (primary) hypertension: Secondary | ICD-10-CM

## 2014-08-18 NOTE — Progress Notes (Signed)
Subjective:    Patient ID: Tiffany Golden, female    DOB: 1976-02-04, 38 y.o.   MRN: [id number]  HPI  38  y.o. female past medical history significant for HIV and AIDS that was diagnosed approximately 18 years ago. Patient was initially treated for HIV while pregnant. She was followed initially at [facility] and was involved in a clinical trials there. She was on therapy for HIV until approximately 8 years ago when she came off medication. She states that the reason that she came off medication was that her insurance policy and her co-pay for antiretrovirals increased.   She does also have comorbid end-stage renal disease and is on hemodialysis since 2012 I met her in the hospital and ultimately restarted on TIVICAY bid and renally dosed epvir as well as wekly viread.  Her VL is undetectable and CD4 is >300  Lab Results  Component Value Date   HIV1RNAQUANT <20 08/03/2014    Lab Results  Component Value Date   CD4TABS 380* 08/03/2014   CD4TABS 340* 02/04/2014   CD4TABS 230* 10/15/2013     She continues to be followed by Dr. Moshe Cipro and Dr. Jimmy Footman and also is seen by primary care physician. SHe is seeing Mallory Shirk for Ob/GYn care.  She is in great spirits today and tells me that she is going to [facility] tomorrow to be evaluated at her first visit for possible kidney transplant.  Review of Systems  Constitutional: Negative for fever, chills, diaphoresis, activity change, appetite change, fatigue and unexpected weight change.  HENT: Negative for congestion, rhinorrhea, sinus pressure, sneezing, sore throat and trouble swallowing.   Eyes: Negative for photophobia and visual disturbance.  Respiratory: Negative for cough, chest tightness, shortness of breath, wheezing and stridor.   Cardiovascular: Negative for chest pain, palpitations and leg swelling.  Gastrointestinal: Negative for nausea, vomiting, abdominal pain, diarrhea, constipation, blood in stool,  abdominal distention and anal bleeding.  Genitourinary: Negative for dysuria, hematuria, flank pain and difficulty urinating.  Musculoskeletal: Negative for arthralgias, back pain, gait problem, joint swelling and myalgias.  Skin: Negative for color change, pallor, rash and wound.  Neurological: Negative for dizziness, tremors, weakness and light-headedness.  Hematological: Negative for adenopathy. Does not bruise/bleed easily.  Psychiatric/Behavioral: Negative for behavioral problems, confusion, sleep disturbance, dysphoric mood, decreased concentration and agitation.       Objective:   Physical Exam  Constitutional: She is oriented to person, place, and time. She appears well-developed and well-nourished. No distress.  HENT:  Head: Normocephalic and atraumatic.  Mouth/Throat: Oropharynx is clear and moist. No oropharyngeal exudate.  Eyes: Conjunctivae and EOM are normal. No scleral icterus.  Neck: Normal range of motion. Neck supple. No JVD present.  Cardiovascular: Normal rate, regular rhythm and normal heart sounds.  Exam reveals no gallop and no friction rub.   No murmur heard. Pulmonary/Chest: Effort normal and breath sounds normal. No respiratory distress. She has no wheezes. She has no rales. She exhibits no tenderness.  Abdominal: She exhibits no distension and no mass. There is no tenderness. There is no rebound and no guarding.  Musculoskeletal: She exhibits no edema and no tenderness.  Lymphadenopathy:    She has no cervical adenopathy.  Neurological: She is alert and oriented to person, place, and time. She exhibits normal muscle tone. Coordination normal.  Skin: Skin is warm and dry. She is not diaphoretic. No erythema. No pallor.     Psychiatric: She has a normal mood and affect. Her behavior is  normal. Judgment and thought content normal.          Assessment & Plan:  HIV and AIDS: Doing a fantastic job a lot to change her anti-retroviral regimen if she undergoes  kidney transplantation    I spent greater than 25  minutes with the patient including greater than 50% of time in face to face counsel of the patient and in coordination of their care.    Hypertension : normotensive today  ESRD on HD: --continue HD  Hyperlipidemia: Lipids still not at goal. Should be placed on a statin

## 2014-08-19 DIAGNOSIS — Z21 Asymptomatic human immunodeficiency virus [HIV] infection status: Secondary | ICD-10-CM | POA: Diagnosis not present

## 2014-08-19 DIAGNOSIS — Z7682 Awaiting organ transplant status: Secondary | ICD-10-CM | POA: Insufficient documentation

## 2014-08-19 DIAGNOSIS — I120 Hypertensive chronic kidney disease with stage 5 chronic kidney disease or end stage renal disease: Secondary | ICD-10-CM | POA: Diagnosis not present

## 2014-08-19 DIAGNOSIS — Z992 Dependence on renal dialysis: Secondary | ICD-10-CM | POA: Diagnosis not present

## 2014-08-19 DIAGNOSIS — I517 Cardiomegaly: Secondary | ICD-10-CM | POA: Diagnosis not present

## 2014-08-19 DIAGNOSIS — N186 End stage renal disease: Secondary | ICD-10-CM | POA: Diagnosis not present

## 2014-09-08 ENCOUNTER — Other Ambulatory Visit: Payer: Self-pay | Admitting: Nurse Practitioner

## 2014-09-11 DIAGNOSIS — N186 End stage renal disease: Secondary | ICD-10-CM | POA: Diagnosis not present

## 2014-09-11 DIAGNOSIS — Z992 Dependence on renal dialysis: Secondary | ICD-10-CM | POA: Diagnosis not present

## 2014-09-13 DIAGNOSIS — D509 Iron deficiency anemia, unspecified: Secondary | ICD-10-CM | POA: Diagnosis not present

## 2014-09-13 DIAGNOSIS — B20 Human immunodeficiency virus [HIV] disease: Secondary | ICD-10-CM | POA: Diagnosis not present

## 2014-09-13 DIAGNOSIS — N2581 Secondary hyperparathyroidism of renal origin: Secondary | ICD-10-CM | POA: Diagnosis not present

## 2014-09-13 DIAGNOSIS — N186 End stage renal disease: Secondary | ICD-10-CM | POA: Diagnosis not present

## 2014-09-30 ENCOUNTER — Telehealth: Payer: Self-pay | Admitting: Infectious Disease

## 2014-09-30 DIAGNOSIS — Z992 Dependence on renal dialysis: Secondary | ICD-10-CM | POA: Diagnosis not present

## 2014-09-30 DIAGNOSIS — B20 Human immunodeficiency virus [HIV] disease: Secondary | ICD-10-CM | POA: Diagnosis not present

## 2014-09-30 DIAGNOSIS — Z23 Encounter for immunization: Secondary | ICD-10-CM | POA: Diagnosis not present

## 2014-09-30 DIAGNOSIS — Z7682 Awaiting organ transplant status: Secondary | ICD-10-CM | POA: Diagnosis not present

## 2014-09-30 DIAGNOSIS — N186 End stage renal disease: Secondary | ICD-10-CM | POA: Diagnosis not present

## 2014-09-30 DIAGNOSIS — I120 Hypertensive chronic kidney disease with stage 5 chronic kidney disease or end stage renal disease: Secondary | ICD-10-CM | POA: Diagnosis not present

## 2014-09-30 DIAGNOSIS — Z21 Asymptomatic human immunodeficiency virus [HIV] infection status: Secondary | ICD-10-CM | POA: Diagnosis not present

## 2014-09-30 NOTE — Telephone Encounter (Signed)
   I received call from   Vicenta Dunning, MD [facility]   [phone]  Pager [phone]  They want to get Toshi off of TNF before her renal transplant  BUT I would like to get a [place] TEST to make sure there is NOT MORE HIV resistance that is hidden in her cells  Can we bring her in and send off this test ot monogram so that we can make sure safe to change to Abacavir from TNF?  I would like to see her after we have the test results back

## 2014-10-01 ENCOUNTER — Telehealth: Payer: Self-pay | Admitting: *Deleted

## 2014-10-01 NOTE — Telephone Encounter (Signed)
error 

## 2014-10-01 NOTE — Telephone Encounter (Signed)
Patient notified and lab appointment scheduled for 10/05/14 and f/u with MD on 10/26/14.

## 2014-10-05 ENCOUNTER — Other Ambulatory Visit (INDEPENDENT_AMBULATORY_CARE_PROVIDER_SITE_OTHER): Payer: 59

## 2014-10-05 DIAGNOSIS — B20 Human immunodeficiency virus [HIV] disease: Secondary | ICD-10-CM

## 2014-10-11 DIAGNOSIS — N186 End stage renal disease: Secondary | ICD-10-CM | POA: Diagnosis not present

## 2014-10-11 DIAGNOSIS — Z992 Dependence on renal dialysis: Secondary | ICD-10-CM | POA: Diagnosis not present

## 2014-10-13 DIAGNOSIS — D509 Iron deficiency anemia, unspecified: Secondary | ICD-10-CM | POA: Diagnosis not present

## 2014-10-13 DIAGNOSIS — N2581 Secondary hyperparathyroidism of renal origin: Secondary | ICD-10-CM | POA: Diagnosis not present

## 2014-10-13 DIAGNOSIS — N186 End stage renal disease: Secondary | ICD-10-CM | POA: Diagnosis not present

## 2014-10-15 DIAGNOSIS — N2581 Secondary hyperparathyroidism of renal origin: Secondary | ICD-10-CM | POA: Diagnosis not present

## 2014-10-15 DIAGNOSIS — D509 Iron deficiency anemia, unspecified: Secondary | ICD-10-CM | POA: Diagnosis not present

## 2014-10-15 DIAGNOSIS — N186 End stage renal disease: Secondary | ICD-10-CM | POA: Diagnosis not present

## 2014-10-18 DIAGNOSIS — N2581 Secondary hyperparathyroidism of renal origin: Secondary | ICD-10-CM | POA: Diagnosis not present

## 2014-10-18 DIAGNOSIS — D509 Iron deficiency anemia, unspecified: Secondary | ICD-10-CM | POA: Diagnosis not present

## 2014-10-18 DIAGNOSIS — N186 End stage renal disease: Secondary | ICD-10-CM | POA: Diagnosis not present

## 2014-10-20 ENCOUNTER — Ambulatory Visit (INDEPENDENT_AMBULATORY_CARE_PROVIDER_SITE_OTHER): Payer: 59 | Admitting: Infectious Disease

## 2014-10-20 ENCOUNTER — Encounter: Payer: Self-pay | Admitting: Infectious Disease

## 2014-10-20 VITALS — BP 137/90 | HR 87 | Temp 98.5°F | Wt 190.0 lb

## 2014-10-20 DIAGNOSIS — I10 Essential (primary) hypertension: Secondary | ICD-10-CM

## 2014-10-20 DIAGNOSIS — N2581 Secondary hyperparathyroidism of renal origin: Secondary | ICD-10-CM | POA: Diagnosis not present

## 2014-10-20 DIAGNOSIS — Z21 Asymptomatic human immunodeficiency virus [HIV] infection status: Secondary | ICD-10-CM

## 2014-10-20 DIAGNOSIS — B20 Human immunodeficiency virus [HIV] disease: Secondary | ICD-10-CM

## 2014-10-20 DIAGNOSIS — Z992 Dependence on renal dialysis: Secondary | ICD-10-CM

## 2014-10-20 DIAGNOSIS — N186 End stage renal disease: Secondary | ICD-10-CM | POA: Diagnosis not present

## 2014-10-20 DIAGNOSIS — D509 Iron deficiency anemia, unspecified: Secondary | ICD-10-CM | POA: Diagnosis not present

## 2014-10-20 NOTE — Progress Notes (Signed)
Subjective:    Patient ID: Tiffany Golden, female    DOB: August 10, 1976, 38 y.o.   MRN: [id number]  HPI  38  y.o. female past medical history significant for HIV and AIDS that was diagnosed approximately 18 years ago. Patient was initially treated for HIV while pregnant. She was followed initially at [facility] and was involved in a clinical trials there. Last notes from [facility] indicated that she had thought of care between 2002 and 2010. When last seen she had undetectable viral load and healthy CD4 count and was on a regimen of Crixivan 800 mg BID, D4T 40 mg BID, Epivir 150 mg BID    She does also have comorbid end-stage renal disease and is on hemodialysis since 2012 I met her in the hospital and ultimately restarted on TIVICAY bid and renally dosed epvir as well as wekly viread.  Her VL is undetectable and CD4 is >300  Lab Results  Component Value Date   HIV1RNAQUANT <20 08/03/2014    Lab Results  Component Value Date   CD4TABS 380* 08/03/2014   CD4TABS 340* 02/04/2014   CD4TABS 230* 10/15/2013     She continues to be followed by Dr. Moshe Cipro and Dr. Jimmy Footman and also is seen by primary care physician. SHe is seeing Mallory Shirk for Ob/GYn care.  She is being evaluated by [facility] tomorrow to be evaluated at her first visit for possible kidney transplant.  In anticipation of changing her to Ziagen from her Viread per the request of transient doctors at [facility] I had sent an HIV June assure archive to [employer] is but it was not yet back today. Once we assured ourselves that she does not have a significant resistance to Ziagen or limit again we will change her over to Ziagen lamivudine and TIVICAY.  Review of Systems  Constitutional: Negative for fever, chills, diaphoresis, activity change, appetite change, fatigue and unexpected weight change.  HENT: Negative for congestion, rhinorrhea, sinus pressure, sneezing, sore throat and trouble swallowing.   Eyes:  Negative for photophobia and visual disturbance.  Respiratory: Negative for cough, chest tightness, shortness of breath, wheezing and stridor.   Cardiovascular: Negative for chest pain, palpitations and leg swelling.  Gastrointestinal: Negative for nausea, vomiting, abdominal pain, diarrhea, constipation, blood in stool, abdominal distention and anal bleeding.  Genitourinary: Negative for dysuria, hematuria, flank pain and difficulty urinating.  Musculoskeletal: Negative for myalgias, back pain, joint swelling, arthralgias and gait problem.  Skin: Negative for color change, pallor, rash and wound.  Neurological: Negative for dizziness, tremors, weakness and light-headedness.  Hematological: Negative for adenopathy. Does not bruise/bleed easily.  Psychiatric/Behavioral: Negative for behavioral problems, confusion, sleep disturbance, dysphoric mood, decreased concentration and agitation.       Objective:   Physical Exam  Constitutional: She is oriented to person, place, and time. She appears well-developed and well-nourished. No distress.  HENT:  Head: Normocephalic and atraumatic.  Mouth/Throat: No oropharyngeal exudate.  Eyes: Conjunctivae and EOM are normal. No scleral icterus.  Neck: Normal range of motion. Neck supple.  Cardiovascular: Normal rate and regular rhythm.   Pulmonary/Chest: Effort normal. No respiratory distress. She has no wheezes.  Abdominal: She exhibits no distension.  Musculoskeletal: She exhibits no edema or tenderness.  Neurological: She is alert and oriented to person, place, and time. She exhibits normal muscle tone. Coordination normal.  Skin: Skin is warm and dry. No rash noted. She is not diaphoretic. No erythema. No pallor.  Psychiatric: She has a normal mood  and affect. Her behavior is normal. Judgment and thought content normal.          Assessment & Plan:  HIV and AIDS: Doing a fantastic job a lot to change her anti-retroviral regimen to Ziagen,  Lamivudine and Tivicay if she undergoes kidney transplantation    I spent greater than 25  minutes with the patient including greater than 50% of time in face to face counsel of the patient and in coordination of their care.   Hypertension : normotensive today  ESRD on HD: --continue HD

## 2014-10-22 DIAGNOSIS — D509 Iron deficiency anemia, unspecified: Secondary | ICD-10-CM | POA: Diagnosis not present

## 2014-10-22 DIAGNOSIS — N2581 Secondary hyperparathyroidism of renal origin: Secondary | ICD-10-CM | POA: Diagnosis not present

## 2014-10-22 DIAGNOSIS — N186 End stage renal disease: Secondary | ICD-10-CM | POA: Diagnosis not present

## 2014-10-25 DIAGNOSIS — N2581 Secondary hyperparathyroidism of renal origin: Secondary | ICD-10-CM | POA: Diagnosis not present

## 2014-10-25 DIAGNOSIS — D509 Iron deficiency anemia, unspecified: Secondary | ICD-10-CM | POA: Diagnosis not present

## 2014-10-25 DIAGNOSIS — N186 End stage renal disease: Secondary | ICD-10-CM | POA: Diagnosis not present

## 2014-10-26 ENCOUNTER — Encounter (HOSPITAL_COMMUNITY): Payer: Self-pay | Admitting: *Deleted

## 2014-10-26 ENCOUNTER — Emergency Department (HOSPITAL_COMMUNITY)
Admission: EM | Admit: 2014-10-26 | Discharge: 2014-10-27 | Disposition: A | Discharge status: Home / Self Care | Payer: Medicare Other | Attending: Emergency Medicine | Admitting: Emergency Medicine

## 2014-10-26 ENCOUNTER — Emergency Department (HOSPITAL_COMMUNITY): Payer: Medicare Other

## 2014-10-26 ENCOUNTER — Ambulatory Visit: Payer: 59 | Admitting: Infectious Disease

## 2014-10-26 DIAGNOSIS — Z992 Dependence on renal dialysis: Secondary | ICD-10-CM | POA: Insufficient documentation

## 2014-10-26 DIAGNOSIS — Z87891 Personal history of nicotine dependence: Secondary | ICD-10-CM | POA: Insufficient documentation

## 2014-10-26 DIAGNOSIS — Z8673 Personal history of transient ischemic attack (TIA), and cerebral infarction without residual deficits: Secondary | ICD-10-CM | POA: Diagnosis not present

## 2014-10-26 DIAGNOSIS — Z862 Personal history of diseases of the blood and blood-forming organs and certain disorders involving the immune mechanism: Secondary | ICD-10-CM | POA: Insufficient documentation

## 2014-10-26 DIAGNOSIS — M25552 Pain in left hip: Secondary | ICD-10-CM | POA: Insufficient documentation

## 2014-10-26 DIAGNOSIS — Z21 Asymptomatic human immunodeficiency virus [HIV] infection status: Secondary | ICD-10-CM | POA: Insufficient documentation

## 2014-10-26 DIAGNOSIS — I120 Hypertensive chronic kidney disease with stage 5 chronic kidney disease or end stage renal disease: Secondary | ICD-10-CM | POA: Diagnosis not present

## 2014-10-26 DIAGNOSIS — Z79899 Other long term (current) drug therapy: Secondary | ICD-10-CM | POA: Diagnosis not present

## 2014-10-26 DIAGNOSIS — Z9861 Coronary angioplasty status: Secondary | ICD-10-CM | POA: Diagnosis not present

## 2014-10-26 DIAGNOSIS — N186 End stage renal disease: Secondary | ICD-10-CM | POA: Insufficient documentation

## 2014-10-26 DIAGNOSIS — Z951 Presence of aortocoronary bypass graft: Secondary | ICD-10-CM | POA: Diagnosis not present

## 2014-10-26 NOTE — ED Notes (Signed)
PT states that she is having left hip pain; pt states that it "feels like it needs to pop"; pt states that it began when she got out of bed this morning; pt denies injury or trauma to hip

## 2014-10-26 NOTE — ED Provider Notes (Signed)
CSN: [id number]     Arrival date & time 10/26/14  2238 History  This chart was scribed for non-physician practitioner, Lenard Galloway, working with Dorie Rank, MD by Evelene Croon, ED Scribe. This patient was seen in room WTR8/WTR8 and the patient's care was started at 11:31 PM.    Chief Complaint  Patient presents with  . Hip Pain     The history is provided by the patient. No language interpreter was used.      HPI Comments:  Tiffany Golden is a 38 y.o. female who presents to the Emergency Department complaining of moderate constant left sided hip pain that she woke up with this am. She denies acute injury or fall but notes did an increased amount of walking 2 days ago and slept on her left side last night. She denies back pain, gluteal pain, pain down her LLE and abdominal pain. Pain is exacerbated when she bears weight on her LLE. She denies a h/o similar hip pain. No alleviating factors noted.   Past Medical History  Diagnosis Date  . Hypertension   . HIV infection   . Chronic kidney disease   . Anemia   . Dialysis patient     mon, wed 52  . ESRD (end stage renal disease)   . Stroke   . Complication of anesthesia   . PONV (postoperative nausea and vomiting)   . TIA (transient ischemic attack)     Hx:of   Past Surgical History  Procedure Laterality Date  . Cesarean section    . Arteriovenous graft placement  09/11/11    left arm  . Revison of arteriovenous fistula Left [id number]    Procedure: Plication left arm fistula;  Surgeon: Elam Dutch, MD;  Location: [place];  Service: Vascular;  Laterality: Left;  . Insertion of dialysis catheter Right 09/01/2013    Procedure: INSERTION OF DIALYSIS CATHETER Right Internal Jugular;  Surgeon: Elam Dutch, MD;  Location: [place];  Service: Vascular;  Laterality: Right;  . Patch angioplasty Left 09/01/2013    Procedure: PATCH ANGIOPLASTY;  Surgeon: Elam Dutch, MD;  Location: [facility] OR;  Service: Vascular;   Laterality: Left;   Family History  Problem Relation Age of Onset  . Hyperlipidemia Mother   . Cancer - Other Mother     Hx partial hysterectomy  . Heart disease Father     Hx CABG   History  Substance Use Topics  . Smoking status: Former Smoker -- 3 years    Types: Cigarettes    Quit date: 09/05/2009  . Smokeless tobacco: Never Used  . Alcohol Use: No   OB History    No data available     Review of Systems  Constitutional: Negative for fever and chills.  HENT: Negative for congestion.   Eyes: Negative for visual disturbance.  Respiratory: Negative for shortness of breath.   Cardiovascular: Negative for chest pain.  Gastrointestinal: Negative for abdominal pain.  Genitourinary: Negative for dysuria, hematuria and difficulty urinating.  Musculoskeletal: Positive for myalgias. Negative for back pain and neck pain.  Skin: Negative for rash.  Hematological: Does not bruise/bleed easily.      Allergies  Sulfa antibiotics; Tressie Ellis; and Vancomycin  Home Medications   Prior to Admission medications   Medication Sig Start Date End Date Taking? Authorizing Provider  B Complex-C-Folic Acid (RENA-VITE RX) 1 MG TABS Take 1 tablet by mouth daily. 07/13/13   Historical Provider, MD  EPIVIR 10 MG/ML solution TAKE 2.5ML  BY MOUTH EVERY DAY 06/21/14   Truman Hayward, MD  metoprolol tartrate (LOPRESSOR) 25 MG tablet Take 25 mg by mouth See admin instructions. Takes twice daily, except takes none on dialysis days (Tuesday, Thursday, and Sunday).    Historical Provider, MD  TIVICAY 50 MG tablet TAKE 1 TABLET BY MOUTH TWICE DAILY 06/21/14   Truman Hayward, MD  VIREAD 300 MG tablet TAKE 1 TABLET BY MOUTH EVERY SUNDAY    Truman Hayward, MD   BP 138/73 mmHg  Pulse 98  Temp(Src) 98.4 F (36.9 C) (Oral)  Resp 16  SpO2 100%  LMP 10/24/2014 Physical Exam  Constitutional: She is oriented to person, place, and time. She appears well-developed and well-nourished.  HENT:  Head:  Normocephalic and atraumatic.  Eyes: Conjunctivae are normal.  Cardiovascular: Normal rate and intact distal pulses.   Pulmonary/Chest: Effort normal.  Abdominal: Soft. She exhibits no distension. There is no tenderness.  Musculoskeletal:  No lumbar or paralumbar TTP No reproducible TTP of the left hip FROM of joint Full weight bearing on ambulation    Neurological: She is alert and oriented to person, place, and time.  Skin: Skin is warm and dry.  Psychiatric: She has a normal mood and affect.  Nursing note and vitals reviewed.   ED Course  Procedures   DIAGNOSTIC STUDIES:  Oxygen Saturation is 100% on RA, normal by my interpretation.    COORDINATION OF CARE:  11:34 PM Discussed treatment plan with pt at bedside and pt agreed to plan.  Labs Review Labs Reviewed - No data to display  Imaging Review No results found.   EKG Interpretation None      MDM   Final diagnoses:  None    1. Left hip pain  Negative imaging for any acute changes. Pain improved with medication. Suspect muscular strain injury. F/U PCP prn.  I personally performed the services described in this documentation, which was scribed in my presence. The recorded information has been reviewed and is accurate.     Dewaine Oats, PA-C 10/27/14 0136  Dorie Rank, MD 10/27/14 [phone]

## 2014-10-27 ENCOUNTER — Encounter: Payer: Self-pay | Admitting: Infectious Disease

## 2014-10-27 ENCOUNTER — Telehealth: Payer: Self-pay | Admitting: *Deleted

## 2014-10-27 DIAGNOSIS — N2581 Secondary hyperparathyroidism of renal origin: Secondary | ICD-10-CM | POA: Diagnosis not present

## 2014-10-27 DIAGNOSIS — D509 Iron deficiency anemia, unspecified: Secondary | ICD-10-CM | POA: Diagnosis not present

## 2014-10-27 DIAGNOSIS — N186 End stage renal disease: Secondary | ICD-10-CM | POA: Diagnosis not present

## 2014-10-27 DIAGNOSIS — M25552 Pain in left hip: Secondary | ICD-10-CM | POA: Diagnosis not present

## 2014-10-27 MED ORDER — OXYCODONE-ACETAMINOPHEN 5-325 MG PO TABS: 1.0000 | ORAL_TABLET | ORAL | Status: DC | PRN

## 2014-10-27 MED ORDER — OXYCODONE-ACETAMINOPHEN 5-325 MG PO TABS
1.0000 | ORAL_TABLET | Freq: Once | ORAL | Status: AC
  Administered 2014-10-27: 1 via ORAL
  Filled 2014-10-27: qty 1

## 2014-10-27 NOTE — Discharge Instructions (Signed)
Arthritis, Nonspecific °Arthritis is inflammation of a joint. This usually means pain, redness, warmth or swelling are present. One or more joints may be involved. There are a number of types of arthritis. Your caregiver may not be able to tell what type of arthritis you have right away. °CAUSES  °The most common cause of arthritis is the wear and tear on the joint (osteoarthritis). This causes damage to the cartilage, which can break down over time. The knees, hips, back and neck are most often affected by this type of arthritis. °Other types of arthritis and common causes of joint pain include: °· Sprains and other injuries near the joint. Sometimes minor sprains and injuries cause pain and swelling that develop hours later. °· Rheumatoid arthritis. This affects hands, feet and knees. It usually affects both sides of your body at the same time. It is often associated with chronic ailments, fever, weight loss and general weakness. °· Crystal arthritis. Gout and pseudo gout can cause occasional acute severe pain, redness and swelling in the foot, ankle, or knee. °· Infectious arthritis. Bacteria can get into a joint through a break in overlying skin. This can cause infection of the joint. Bacteria and viruses can also spread through the blood and affect your joints. °· Drug, infectious and allergy reactions. Sometimes joints can become mildly painful and slightly swollen with these types of illnesses. °SYMPTOMS  °· Pain is the main symptom. °· Your joint or joints can also be red, swollen and warm or hot to the touch. °· You may have a fever with certain types of arthritis, or even feel overall ill. °· The joint with arthritis will hurt with movement. Stiffness is present with some types of arthritis. °DIAGNOSIS  °Your caregiver will suspect arthritis based on your description of your symptoms and on your exam. Testing may be needed to find the type of arthritis: °· Blood and sometimes urine tests. °· X-ray tests  and sometimes CT or MRI scans. °· Removal of fluid from the joint (arthrocentesis) is done to check for bacteria, crystals or other causes. Your caregiver (or a specialist) will numb the area over the joint with a local anesthetic, and use a needle to remove joint fluid for examination. This procedure is only minimally uncomfortable. °· Even with these tests, your caregiver may not be able to tell what kind of arthritis you have. Consultation with a specialist (rheumatologist) may be helpful. °TREATMENT  °Your caregiver will discuss with you treatment specific to your type of arthritis. If the specific type cannot be determined, then the following general recommendations may apply. °Treatment of severe joint pain includes: °· Rest. °· Elevation. °· Anti-inflammatory medication (for example, ibuprofen) may be prescribed. Avoiding activities that cause increased pain. °· Only take over-the-counter or prescription medicines for pain and discomfort as recommended by your caregiver. °· Cold packs over an inflamed joint may be used for 10 to 15 minutes every hour. Hot packs sometimes feel better, but do not use overnight. Do not use hot packs if you are diabetic without your caregiver's permission. °· A cortisone shot into arthritic joints may help reduce pain and swelling. °· Any acute arthritis that gets worse over the next 1 to 2 days needs to be looked at to be sure there is no joint infection. °Long-term arthritis treatment involves modifying activities and lifestyle to reduce joint stress jarring. This can include weight loss. Also, exercise is needed to nourish the joint cartilage and remove waste. This helps keep the muscles   around the joint strong. °HOME CARE INSTRUCTIONS  °· Do not take aspirin to relieve pain if gout is suspected. This elevates uric acid levels. °· Only take over-the-counter or prescription medicines for pain, discomfort or fever as directed by your caregiver. °· Rest the joint as much as  possible. °· If your joint is swollen, keep it elevated. °· Use crutches if the painful joint is in your leg. °· Drinking plenty of fluids may help for certain types of arthritis. °· Follow your caregiver's dietary instructions. °· Try low-impact exercise such as: °¨ Swimming. °¨ Water aerobics. °¨ Biking. °¨ Walking. °· Morning stiffness is often relieved by a warm shower. °· Put your joints through regular range-of-motion. °SEEK MEDICAL CARE IF:  °· You do not feel better in 24 hours or are getting worse. °· You have side effects to medications, or are not getting better with treatment. °SEEK IMMEDIATE MEDICAL CARE IF:  °· You have a fever. °· You develop severe joint pain, swelling or redness. °· Many joints are involved and become painful and swollen. °· There is severe back pain and/or leg weakness. °· You have loss of bowel or bladder control. °Document Released: 12/06/2004 Document Revised: 01/21/2012 Document Reviewed: 12/22/2008 °Exit[employer]® Patient Information ©2015 [employer]. This information is not intended to replace advice given to you by your health care provider. Make sure you discuss any questions you have with your health care provider. ° °

## 2014-10-27 NOTE — Telephone Encounter (Signed)
It does not appear to have come in yet.  I'll forward your inquiry to Dr. Tommy Medal to see if he has it in his office. Best of luck for the transplant, Rito Ehrlich  ===View-only below this line===   ----- Message -----    From: Hurley Cisco    Sent: 10/27/2014  3:44 PM EST      To: Alcide Evener, MD Subject: Visit Follow-Up Question  Hello! I'm contacting you to find out if the results from the sequencing test have come in yet (for my transplant).

## 2014-10-29 DIAGNOSIS — N2581 Secondary hyperparathyroidism of renal origin: Secondary | ICD-10-CM | POA: Diagnosis not present

## 2014-10-29 DIAGNOSIS — D509 Iron deficiency anemia, unspecified: Secondary | ICD-10-CM | POA: Diagnosis not present

## 2014-10-29 DIAGNOSIS — N186 End stage renal disease: Secondary | ICD-10-CM | POA: Diagnosis not present

## 2014-10-31 ENCOUNTER — Emergency Department (HOSPITAL_COMMUNITY)
Admission: EM | Admit: 2014-10-31 | Discharge: 2014-10-31 | Disposition: A | Discharge status: Home / Self Care | Payer: Medicare Other | Attending: Emergency Medicine | Admitting: Emergency Medicine

## 2014-10-31 ENCOUNTER — Emergency Department (HOSPITAL_COMMUNITY): Payer: Medicare Other

## 2014-10-31 ENCOUNTER — Encounter (HOSPITAL_COMMUNITY): Payer: Self-pay | Admitting: Emergency Medicine

## 2014-10-31 DIAGNOSIS — Z992 Dependence on renal dialysis: Secondary | ICD-10-CM | POA: Insufficient documentation

## 2014-10-31 DIAGNOSIS — Z79899 Other long term (current) drug therapy: Secondary | ICD-10-CM | POA: Diagnosis not present

## 2014-10-31 DIAGNOSIS — M25559 Pain in unspecified hip: Secondary | ICD-10-CM

## 2014-10-31 DIAGNOSIS — N186 End stage renal disease: Secondary | ICD-10-CM | POA: Insufficient documentation

## 2014-10-31 DIAGNOSIS — D649 Anemia, unspecified: Secondary | ICD-10-CM | POA: Diagnosis not present

## 2014-10-31 DIAGNOSIS — Z87891 Personal history of nicotine dependence: Secondary | ICD-10-CM | POA: Insufficient documentation

## 2014-10-31 DIAGNOSIS — Z21 Asymptomatic human immunodeficiency virus [HIV] infection status: Secondary | ICD-10-CM | POA: Insufficient documentation

## 2014-10-31 DIAGNOSIS — M25552 Pain in left hip: Secondary | ICD-10-CM | POA: Insufficient documentation

## 2014-10-31 DIAGNOSIS — M5136 Other intervertebral disc degeneration, lumbar region: Secondary | ICD-10-CM | POA: Insufficient documentation

## 2014-10-31 DIAGNOSIS — I120 Hypertensive chronic kidney disease with stage 5 chronic kidney disease or end stage renal disease: Secondary | ICD-10-CM | POA: Diagnosis not present

## 2014-10-31 DIAGNOSIS — Z8673 Personal history of transient ischemic attack (TIA), and cerebral infarction without residual deficits: Secondary | ICD-10-CM | POA: Insufficient documentation

## 2014-10-31 DIAGNOSIS — M9953 Intervertebral disc stenosis of neural canal of lumbar region: Secondary | ICD-10-CM | POA: Diagnosis not present

## 2014-10-31 MED ORDER — OXYCODONE-ACETAMINOPHEN 5-325 MG PO TABS: 1.0000 | ORAL_TABLET | Freq: Four times a day (QID) | ORAL | Status: DC | PRN

## 2014-10-31 MED ORDER — OXYCODONE-ACETAMINOPHEN 5-325 MG PO TABS
2.0000 | ORAL_TABLET | Freq: Once | ORAL | Status: AC
  Administered 2014-10-31: 2 via ORAL
  Filled 2014-10-31: qty 2

## 2014-10-31 MED ORDER — OXYCODONE-ACETAMINOPHEN 5-325 MG PO TABS: 1.0000 | ORAL_TABLET | ORAL | Status: DC | PRN

## 2014-10-31 NOTE — ED Notes (Signed)
Pt c/o left hip pain starting Tuesday morning after sleeping on left side all night, pt went to ED on Tuesday night, xray was negative. Pt instructed to follow up with PCP, who could not see patient until Wednesday. Pt here for pain control.

## 2014-10-31 NOTE — ED Notes (Signed)
PA at bedside.

## 2014-10-31 NOTE — ED Notes (Signed)
Pts acuity changed to 3 due to history and delay in waiting for pain med results.

## 2014-10-31 NOTE — ED Provider Notes (Signed)
CSN: [id number]     Arrival date & time 10/31/14  1048 History   First MD Initiated Contact with Patient 10/31/14 1117     Chief Complaint  Patient presents with  . Hip Pain     (Consider location/radiation/quality/duration/timing/severity/associated sxs/prior Treatment) The history is provided by the patient.    Pt with hx HIV (CD4 380), ESRD p/w left hip pain that began 6 days ago upon waking.  Pt seen in ED the next day with negative xray, d/c home with percocet and PCP follow up.  Pt reports she continues to have pain described as soreness with occasional throbbing in the left hip, worse with ambulation and with certain movements.  She did have 1 hour of numbness, "decreased sensation" that ran down the lateral aspect of the left leg to her foot last night but resolved.  Denies fevers, leg swelling, weakness of the leg.  Denies back pain. Denies fevers, chills, abdominal pain, loss of control of bowel or bladder, saddle anesthesia, bowel, urinary, or vaginal complaints.  She does not produce urine.  Last dialysis was 2 days ago.  Denies CP, SOB.    Past Medical History  Diagnosis Date  . Hypertension   . HIV infection   . Chronic kidney disease   . Anemia   . Dialysis patient     mon, wed 56  . ESRD (end stage renal disease)   . Stroke   . Complication of anesthesia   . PONV (postoperative nausea and vomiting)   . TIA (transient ischemic attack)     Hx:of   Past Surgical History  Procedure Laterality Date  . Cesarean section    . Arteriovenous graft placement  09/11/11    left arm  . Revison of arteriovenous fistula Left [id number]    Procedure: Plication left arm fistula;  Surgeon: Elam Dutch, MD;  Location: [place];  Service: Vascular;  Laterality: Left;  . Insertion of dialysis catheter Right 09/01/2013    Procedure: INSERTION OF DIALYSIS CATHETER Right Internal Jugular;  Surgeon: Elam Dutch, MD;  Location: [place];  Service: Vascular;  Laterality: Right;    . Patch angioplasty Left 09/01/2013    Procedure: PATCH ANGIOPLASTY;  Surgeon: Elam Dutch, MD;  Location: [facility];  Service: Vascular;  Laterality: Left;   Family History  Problem Relation Age of Onset  . Hyperlipidemia Mother   . Cancer - Other Mother     Hx partial hysterectomy  . Heart disease Father     Hx CABG   History  Substance Use Topics  . Smoking status: Former Smoker -- 3 years    Types: Cigarettes    Quit date: 09/05/2009  . Smokeless tobacco: Never Used  . Alcohol Use: No   OB History    No data available     Review of Systems  All other systems reviewed and are negative.     Allergies  Sulfa antibiotics; Tressie Ellis; and Vancomycin  Home Medications   Prior to Admission medications   Medication Sig Start Date End Date Taking? Authorizing Provider  B Complex-C-Folic Acid (RENA-VITE RX) 1 MG TABS Take 1 tablet by mouth daily. 07/13/13   Historical Provider, MD  EPIVIR 10 MG/ML solution TAKE 2.5ML BY MOUTH EVERY DAY 06/21/14   Truman Hayward, MD  metoprolol tartrate (LOPRESSOR) 25 MG tablet Take 25 mg by mouth See admin instructions. Takes twice daily, except takes none on dialysis days (Tuesday, Thursday, and Sunday).    Historical Provider,  MD  oxyCODONE-acetaminophen (PERCOCET/ROXICET) 5-325 MG per tablet Take 1 tablet by mouth every 4 (four) hours as needed for severe pain. 10/27/14   Dewaine Oats, PA-C  TIVICAY 50 MG tablet TAKE 1 TABLET BY MOUTH TWICE DAILY 06/21/14   Truman Hayward, MD  VIREAD 300 MG tablet TAKE 1 TABLET BY MOUTH EVERY SUNDAY    Truman Hayward, MD   BP 137/83 mmHg  Pulse 102  Temp(Src) 98.2 F (36.8 C) (Oral)  Resp 16  SpO2 97%  LMP 10/24/2014 Physical Exam  Constitutional: She appears well-developed and well-nourished. No distress.  HENT:  Head: Normocephalic and atraumatic.  Neck: Neck supple.  Pulmonary/Chest: Effort normal.  Musculoskeletal:       Left hip: She exhibits normal range of motion, no  tenderness, no bony tenderness, no swelling, no crepitus and no deformity.       Legs: Spine nontender, no crepitus, or stepoffs.  Lower extremities:  Strength 5/5, sensation intact, distal pulses intact.    Left hip without erythema, edema, warmth, discharge, or tenderness.   Neurological: She is alert.  Skin: She is not diaphoretic.  Nursing note and vitals reviewed.   ED Course  Procedures (including critical care time) Labs Review Labs Reviewed - No data to display  Imaging Review Dg Lumbar Spine Complete  10/31/2014   CLINICAL DATA:  Low back pain with extension into left tip for 5 days  EXAM: LUMBAR SPINE - COMPLETE 4+ VIEW  COMPARISON:  None.  FINDINGS: Frontal, lateral, spot lumbosacral lateral, and bilateral oblique views were obtained. There are 5 non-rib-bearing lumbar type vertebral bodies. There is slight rotatory scoliosis. There is mild disc space narrowing at L3-4, L4-5, L5-S1. There is no appreciable facet arthropathy.  IMPRESSION: Slight scoliosis with mild disc space narrowing at several levels. No fracture or spondylolisthesis.   Electronically Signed   By: Lowella Grip M.D.   On: 10/31/2014 12:23     EKG Interpretation None      MDM   Final diagnoses:  Hip pain  Narrowing of lumbar intervertebral disc space    Afebrile, nontoxic patient with left hip pain x 6 days without injury.  Hip xray 10/26/14 was negative.  Lumbar spine film today shows mild disc space narrowing, it is possible the symptoms she is experiencing are related to radiculopathy rather than true hip pain.  No red flags for back pain.   Has PCP follow up in 3 days, requesting pain medication until then. No clinical e/o septic hip.   D/C home with percocet.  Pt has walker to use as needed for support.  Discussed result, findings, treatment, and follow up  with patient.  Pt given return precautions.  Pt verbalizes understanding and agrees with plan.         Clayton Bibles, PA-C 10/31/14  1606  Carmin Muskrat, MD 10/31/14 [phone]

## 2014-10-31 NOTE — Discharge Instructions (Signed)
Read the information below.  Use the prescribed medication as directed.  Please discuss all new medications with your pharmacist.  Do not take additional tylenol while taking the prescribed pain medication to avoid overdose.  You may return to the Emergency Department at any time for worsening condition or any new symptoms that concern you.  If there is any possibility that you might be pregnant, please let your health care provider know and discuss this with the pharmacist to ensure medication safety.   If you develop fevers, loss of control of bowel or bladder, weakness or numbness in your legs, or are unable to walk, return to the ER for a recheck. If you develop uncontrolled pain, weakness or numbness of the extremity, severe discoloration of the skin, or you are unable to walk, return to the ER for a recheck.

## 2014-11-01 ENCOUNTER — Telehealth: Payer: Self-pay | Admitting: Infectious Disease

## 2014-11-01 DIAGNOSIS — N186 End stage renal disease: Secondary | ICD-10-CM | POA: Diagnosis not present

## 2014-11-01 DIAGNOSIS — D509 Iron deficiency anemia, unspecified: Secondary | ICD-10-CM | POA: Diagnosis not present

## 2014-11-01 DIAGNOSIS — N2581 Secondary hyperparathyroidism of renal origin: Secondary | ICD-10-CM | POA: Diagnosis not present

## 2014-11-01 MED ORDER — ABACAVIR SULFATE 300 MG PO TABS: 600.0000 mg | ORAL_TABLET | Freq: Every day | ORAL | Status: DC

## 2014-11-01 NOTE — Telephone Encounter (Signed)
Patient informed of new regimen.   Stop Viread and start Ziagen.  She start on Tuesday, November 02, 2014.  Laverle Patter, RN

## 2014-11-01 NOTE — Telephone Encounter (Signed)
It is back now and we are changing to ziagen

## 2014-11-01 NOTE — Telephone Encounter (Signed)
Patient's genotype June assure archive from [employer] is came back and shows no resistance to abacavir:      Therefore we will change her from VIREAD weekly to daily ZIAGEN 600mg . The rest of her HIV regimen stays the same  CAN WE CALL Natalyia AND ASK HER TO PICK UP HER ZIAGEN AND STOP HER VIREAD  SHE WILL TAKE TWO ZIAGEN DAILY FOR 600MG  DAILY ALONG WITH THE TIVICAY AND THE EPIVIR SHE IS ALREADY TAKING. THIS WILL A MORE KIDNEY FRIENDLY REGIMEN IN ANTICIPATION OF POTENTIAL TRANSPLANT AT [facility]

## 2014-11-01 NOTE — Telephone Encounter (Signed)
Perfect

## 2014-11-03 ENCOUNTER — Ambulatory Visit (INDEPENDENT_AMBULATORY_CARE_PROVIDER_SITE_OTHER): Payer: 59 | Admitting: Nurse Practitioner

## 2014-11-03 ENCOUNTER — Encounter: Payer: Self-pay | Admitting: Nurse Practitioner

## 2014-11-03 VITALS — BP 122/86 | Ht 61.0 in | Wt 190.0 lb

## 2014-11-03 DIAGNOSIS — N186 End stage renal disease: Secondary | ICD-10-CM | POA: Diagnosis not present

## 2014-11-03 DIAGNOSIS — N2581 Secondary hyperparathyroidism of renal origin: Secondary | ICD-10-CM | POA: Diagnosis not present

## 2014-11-03 DIAGNOSIS — M5136 Other intervertebral disc degeneration, lumbar region: Secondary | ICD-10-CM | POA: Diagnosis not present

## 2014-11-03 DIAGNOSIS — M5442 Lumbago with sciatica, left side: Secondary | ICD-10-CM

## 2014-11-03 DIAGNOSIS — M545 Low back pain: Secondary | ICD-10-CM | POA: Diagnosis not present

## 2014-11-03 DIAGNOSIS — D509 Iron deficiency anemia, unspecified: Secondary | ICD-10-CM | POA: Diagnosis not present

## 2014-11-03 MED ORDER — OXYCODONE-ACETAMINOPHEN 7.5-325 MG PO TABS: 1.0000 | ORAL_TABLET | Freq: Four times a day (QID) | ORAL | Status: DC | PRN

## 2014-11-03 MED ORDER — GABAPENTIN 100 MG PO CAPS: 100.0000 mg | ORAL_CAPSULE | Freq: Two times a day (BID) | ORAL | Status: DC

## 2014-11-04 NOTE — Telephone Encounter (Signed)
Discussed results of MRI with pt. Pt does not have a neurosurgeon. She states Hoyle Sauer was going to referr her. Advised pt i would send carolyn a message and that it could take a few weeks to get in with specialist. Pt wants to see whom ever you recommend.

## 2014-11-04 NOTE — Telephone Encounter (Signed)
MRI no surgical issue, neurosurgery more than likely will offer injections, nothing critical on MRI, plz call [phone]

## 2014-11-06 DIAGNOSIS — N2581 Secondary hyperparathyroidism of renal origin: Secondary | ICD-10-CM | POA: Diagnosis not present

## 2014-11-06 DIAGNOSIS — D509 Iron deficiency anemia, unspecified: Secondary | ICD-10-CM | POA: Diagnosis not present

## 2014-11-06 DIAGNOSIS — N186 End stage renal disease: Secondary | ICD-10-CM | POA: Diagnosis not present

## 2014-11-07 ENCOUNTER — Encounter: Payer: Self-pay | Admitting: Nurse Practitioner

## 2014-11-07 NOTE — Progress Notes (Signed)
Subjective:  Presents for complaints of left low back pain radiating into the left buttock into the left lateral leg to just above the knee. Began a week ago, woke up with the pain. Was so severe she could not put any pressure on her left leg, unable to walk. Applied ice/heat. Went to local ED, diagnosed with left hip pain. X-ray of left hip was normal. Pain became worse over the next several days, went back to ED on 12/20. Lumbar x-ray showed mild disc space narrowing at several levels. Pain remains intense.  Objective:   BP 122/86 mmHg  Ht 5\' 1"  (1.549 m)  Wt 190 lb (86.183 kg)  BMI 35.92 kg/m2  LMP 10/24/2014 NAD. Alert, oriented. Patient is sitting in wheelchair due to problems with ambulation. Tenderness with palpation of the left lumbar area into the left buttock. SLR positive bilaterally producing pain in the left low back area. Reflexes normal limit lower extremities. Sensation grossly intact. MRI was scheduled for today see results.  Assessment: Left-sided low back pain with left-sided sciatica - Plan: MR Lumbar Spine Wo Contrast, Ambulatory referral to Neurosurgery  Plan:  Meds ordered this encounter  Medications  . gabapentin (NEURONTIN) 100 MG capsule    Sig: Take 1 capsule (100 mg total) by mouth 2 (two) times daily.    Dispense:  60 capsule    Refill:  0    Order Specific Question:  Supervising Provider    Answer:  Mikey Kirschner [[id number]]  . oxyCODONE-acetaminophen (PERCOCET) 7.5-325 MG per tablet    Sig: Take 1 tablet by mouth every 6 (six) hours as needed for pain.    Dispense:  30 tablet    Refill:  0    Order Specific Question:  Supervising Provider    Answer:  Mikey Kirschner [[id number]]   Our goal is to bring her pain to a tolerable level until we can get her in with neurosurgeon. Warning signs reviewed. Call back in the meantime if needed. Will order low-dose Neurontin due to ESRD/dialysis.

## 2014-11-08 DIAGNOSIS — D509 Iron deficiency anemia, unspecified: Secondary | ICD-10-CM | POA: Diagnosis not present

## 2014-11-08 DIAGNOSIS — N2581 Secondary hyperparathyroidism of renal origin: Secondary | ICD-10-CM | POA: Diagnosis not present

## 2014-11-08 DIAGNOSIS — N186 End stage renal disease: Secondary | ICD-10-CM | POA: Diagnosis not present

## 2014-11-10 DIAGNOSIS — N186 End stage renal disease: Secondary | ICD-10-CM | POA: Diagnosis not present

## 2014-11-10 DIAGNOSIS — N2581 Secondary hyperparathyroidism of renal origin: Secondary | ICD-10-CM | POA: Diagnosis not present

## 2014-11-10 DIAGNOSIS — D509 Iron deficiency anemia, unspecified: Secondary | ICD-10-CM | POA: Diagnosis not present

## 2014-11-11 DIAGNOSIS — N186 End stage renal disease: Secondary | ICD-10-CM | POA: Diagnosis not present

## 2014-11-11 DIAGNOSIS — Z992 Dependence on renal dialysis: Secondary | ICD-10-CM | POA: Diagnosis not present

## 2014-11-13 DIAGNOSIS — N186 End stage renal disease: Secondary | ICD-10-CM | POA: Diagnosis not present

## 2014-11-13 DIAGNOSIS — D509 Iron deficiency anemia, unspecified: Secondary | ICD-10-CM | POA: Diagnosis not present

## 2014-11-13 DIAGNOSIS — N2581 Secondary hyperparathyroidism of renal origin: Secondary | ICD-10-CM | POA: Diagnosis not present

## 2014-11-15 ENCOUNTER — Encounter: Payer: Self-pay | Admitting: Infectious Disease

## 2014-11-15 ENCOUNTER — Encounter: Payer: Self-pay | Admitting: Family Medicine

## 2014-11-15 DIAGNOSIS — N2581 Secondary hyperparathyroidism of renal origin: Secondary | ICD-10-CM | POA: Diagnosis not present

## 2014-11-15 DIAGNOSIS — N186 End stage renal disease: Secondary | ICD-10-CM | POA: Diagnosis not present

## 2014-11-15 DIAGNOSIS — D509 Iron deficiency anemia, unspecified: Secondary | ICD-10-CM | POA: Diagnosis not present

## 2014-11-17 ENCOUNTER — Other Ambulatory Visit: Payer: Self-pay | Admitting: *Deleted

## 2014-11-17 ENCOUNTER — Telehealth: Payer: Self-pay | Admitting: *Deleted

## 2014-11-17 DIAGNOSIS — D509 Iron deficiency anemia, unspecified: Secondary | ICD-10-CM | POA: Diagnosis not present

## 2014-11-17 DIAGNOSIS — N186 End stage renal disease: Secondary | ICD-10-CM | POA: Diagnosis not present

## 2014-11-17 DIAGNOSIS — N2581 Secondary hyperparathyroidism of renal origin: Secondary | ICD-10-CM | POA: Diagnosis not present

## 2014-11-17 MED ORDER — OXYCODONE-ACETAMINOPHEN 7.5-325 MG PO TABS: 1.0000 | ORAL_TABLET | Freq: Four times a day (QID) | ORAL | Status: DC | PRN

## 2014-11-17 NOTE — Telephone Encounter (Signed)
May refill once 24 tablets, I do not recommend ongoing pain meds, pt will need follow up office visit if pain continues, (at this point I do not want to fall into regular percocet use)

## 2014-11-17 NOTE — Telephone Encounter (Signed)
Discussed with pt. Pt notified script ready and will need office visit before further refills.

## 2014-11-17 NOTE — Telephone Encounter (Signed)
Hoyle Sauer sent staff message requesting this request for percocoet be sent to Dr. Nicki Reaper since Hoyle Sauer is off. Hoyle Sauer states pt just finished referreal to specialist. Last seen 11/03/14. Pt got rx for percocet 7.5/325 #30 one q 6 hours on 11/03/14.

## 2014-11-19 ENCOUNTER — Other Ambulatory Visit: Payer: Self-pay | Admitting: Nurse Practitioner

## 2014-11-19 DIAGNOSIS — D509 Iron deficiency anemia, unspecified: Secondary | ICD-10-CM | POA: Diagnosis not present

## 2014-11-19 DIAGNOSIS — N2581 Secondary hyperparathyroidism of renal origin: Secondary | ICD-10-CM | POA: Diagnosis not present

## 2014-11-19 DIAGNOSIS — N186 End stage renal disease: Secondary | ICD-10-CM | POA: Diagnosis not present

## 2014-11-19 MED ORDER — PREDNISONE 20 MG PO TABS: ORAL_TABLET | ORAL | Status: DC

## 2014-11-22 DIAGNOSIS — N2581 Secondary hyperparathyroidism of renal origin: Secondary | ICD-10-CM | POA: Diagnosis not present

## 2014-11-22 DIAGNOSIS — N186 End stage renal disease: Secondary | ICD-10-CM | POA: Diagnosis not present

## 2014-11-22 DIAGNOSIS — D509 Iron deficiency anemia, unspecified: Secondary | ICD-10-CM | POA: Diagnosis not present

## 2014-11-23 ENCOUNTER — Encounter: Payer: Self-pay | Admitting: Family Medicine

## 2014-11-24 DIAGNOSIS — N2581 Secondary hyperparathyroidism of renal origin: Secondary | ICD-10-CM | POA: Diagnosis not present

## 2014-11-24 DIAGNOSIS — D509 Iron deficiency anemia, unspecified: Secondary | ICD-10-CM | POA: Diagnosis not present

## 2014-11-24 DIAGNOSIS — N186 End stage renal disease: Secondary | ICD-10-CM | POA: Diagnosis not present

## 2014-11-26 ENCOUNTER — Other Ambulatory Visit: Payer: Self-pay | Admitting: Nurse Practitioner

## 2014-11-26 DIAGNOSIS — N2581 Secondary hyperparathyroidism of renal origin: Secondary | ICD-10-CM | POA: Diagnosis not present

## 2014-11-26 DIAGNOSIS — N186 End stage renal disease: Secondary | ICD-10-CM | POA: Diagnosis not present

## 2014-11-26 DIAGNOSIS — D509 Iron deficiency anemia, unspecified: Secondary | ICD-10-CM | POA: Diagnosis not present

## 2014-11-29 ENCOUNTER — Other Ambulatory Visit: Payer: Self-pay | Admitting: Nurse Practitioner

## 2014-11-29 DIAGNOSIS — D509 Iron deficiency anemia, unspecified: Secondary | ICD-10-CM | POA: Diagnosis not present

## 2014-11-29 DIAGNOSIS — N186 End stage renal disease: Secondary | ICD-10-CM | POA: Diagnosis not present

## 2014-11-29 DIAGNOSIS — N2581 Secondary hyperparathyroidism of renal origin: Secondary | ICD-10-CM | POA: Diagnosis not present

## 2014-11-30 ENCOUNTER — Telehealth: Payer: Self-pay | Admitting: Family Medicine

## 2014-11-30 ENCOUNTER — Other Ambulatory Visit: Payer: Self-pay | Admitting: Nurse Practitioner

## 2014-11-30 MED ORDER — PREDNISONE 20 MG PO TABS: ORAL_TABLET | ORAL | Status: DC

## 2014-11-30 NOTE — Telephone Encounter (Signed)
predniSONE (DELTASONE) 20 MG tablet   We sent in this script on the 8th an [facility] receipted it Pt is calling today to say that we have told them we need to speak with Her before we will release this med to her? Asked Nurses an [place], None of them know or understand what this means?   Maybe we need to call Jose Persia to clarify?

## 2014-11-30 NOTE — Telephone Encounter (Signed)
I called pt to clarify what she was asking for. Pt is requesting a refill on prednisone taper. She finished prednisone yesterday. She is feeling better but feels like she just needs maybe one more week on it. She was seen 12/23 for back pain and prednisone sent in on 1/8.

## 2014-12-01 DIAGNOSIS — D509 Iron deficiency anemia, unspecified: Secondary | ICD-10-CM | POA: Diagnosis not present

## 2014-12-01 DIAGNOSIS — N186 End stage renal disease: Secondary | ICD-10-CM | POA: Diagnosis not present

## 2014-12-01 DIAGNOSIS — N2581 Secondary hyperparathyroidism of renal origin: Secondary | ICD-10-CM | POA: Diagnosis not present

## 2014-12-03 DIAGNOSIS — N2581 Secondary hyperparathyroidism of renal origin: Secondary | ICD-10-CM | POA: Diagnosis not present

## 2014-12-03 DIAGNOSIS — N186 End stage renal disease: Secondary | ICD-10-CM | POA: Diagnosis not present

## 2014-12-03 DIAGNOSIS — D509 Iron deficiency anemia, unspecified: Secondary | ICD-10-CM | POA: Diagnosis not present

## 2014-12-06 DIAGNOSIS — D509 Iron deficiency anemia, unspecified: Secondary | ICD-10-CM | POA: Diagnosis not present

## 2014-12-06 DIAGNOSIS — N186 End stage renal disease: Secondary | ICD-10-CM | POA: Diagnosis not present

## 2014-12-06 DIAGNOSIS — N2581 Secondary hyperparathyroidism of renal origin: Secondary | ICD-10-CM | POA: Diagnosis not present

## 2014-12-08 ENCOUNTER — Telehealth: Payer: Self-pay | Admitting: Nurse Practitioner

## 2014-12-08 ENCOUNTER — Other Ambulatory Visit: Payer: Self-pay | Admitting: Nurse Practitioner

## 2014-12-08 DIAGNOSIS — M25559 Pain in unspecified hip: Secondary | ICD-10-CM

## 2014-12-08 DIAGNOSIS — N186 End stage renal disease: Secondary | ICD-10-CM | POA: Diagnosis not present

## 2014-12-08 DIAGNOSIS — M549 Dorsalgia, unspecified: Secondary | ICD-10-CM

## 2014-12-08 DIAGNOSIS — N2581 Secondary hyperparathyroidism of renal origin: Secondary | ICD-10-CM | POA: Diagnosis not present

## 2014-12-08 DIAGNOSIS — D509 Iron deficiency anemia, unspecified: Secondary | ICD-10-CM | POA: Diagnosis not present

## 2014-12-08 NOTE — Telephone Encounter (Signed)
Discussed with pt. Order for referral put in.  

## 2014-12-08 NOTE — Telephone Encounter (Signed)
Pt is finishing her prednisone and is still experiencing the back and hip  Pain and is wanting to know what she needs to do when she runs out.

## 2014-12-08 NOTE — Telephone Encounter (Signed)
I recommend orthopedic referral. No further steroids.

## 2014-12-10 ENCOUNTER — Other Ambulatory Visit: Payer: Self-pay | Admitting: Infectious Disease

## 2014-12-10 DIAGNOSIS — N2581 Secondary hyperparathyroidism of renal origin: Secondary | ICD-10-CM | POA: Diagnosis not present

## 2014-12-10 DIAGNOSIS — D509 Iron deficiency anemia, unspecified: Secondary | ICD-10-CM | POA: Diagnosis not present

## 2014-12-10 DIAGNOSIS — N186 End stage renal disease: Secondary | ICD-10-CM | POA: Diagnosis not present

## 2014-12-12 DIAGNOSIS — N186 End stage renal disease: Secondary | ICD-10-CM | POA: Diagnosis not present

## 2014-12-12 DIAGNOSIS — Z992 Dependence on renal dialysis: Secondary | ICD-10-CM | POA: Diagnosis not present

## 2014-12-13 DIAGNOSIS — N2581 Secondary hyperparathyroidism of renal origin: Secondary | ICD-10-CM | POA: Diagnosis not present

## 2014-12-13 DIAGNOSIS — D509 Iron deficiency anemia, unspecified: Secondary | ICD-10-CM | POA: Diagnosis not present

## 2014-12-13 DIAGNOSIS — N186 End stage renal disease: Secondary | ICD-10-CM | POA: Diagnosis not present

## 2014-12-13 DIAGNOSIS — Z23 Encounter for immunization: Secondary | ICD-10-CM | POA: Diagnosis not present

## 2014-12-14 ENCOUNTER — Other Ambulatory Visit: Payer: 59

## 2014-12-15 DIAGNOSIS — N186 End stage renal disease: Secondary | ICD-10-CM | POA: Diagnosis not present

## 2014-12-15 DIAGNOSIS — D509 Iron deficiency anemia, unspecified: Secondary | ICD-10-CM | POA: Diagnosis not present

## 2014-12-15 DIAGNOSIS — N2581 Secondary hyperparathyroidism of renal origin: Secondary | ICD-10-CM | POA: Diagnosis not present

## 2014-12-15 DIAGNOSIS — Z23 Encounter for immunization: Secondary | ICD-10-CM | POA: Diagnosis not present

## 2014-12-17 DIAGNOSIS — N2581 Secondary hyperparathyroidism of renal origin: Secondary | ICD-10-CM | POA: Diagnosis not present

## 2014-12-17 DIAGNOSIS — Z23 Encounter for immunization: Secondary | ICD-10-CM | POA: Diagnosis not present

## 2014-12-17 DIAGNOSIS — D509 Iron deficiency anemia, unspecified: Secondary | ICD-10-CM | POA: Diagnosis not present

## 2014-12-17 DIAGNOSIS — N186 End stage renal disease: Secondary | ICD-10-CM | POA: Diagnosis not present

## 2014-12-20 DIAGNOSIS — Z23 Encounter for immunization: Secondary | ICD-10-CM | POA: Diagnosis not present

## 2014-12-20 DIAGNOSIS — N2581 Secondary hyperparathyroidism of renal origin: Secondary | ICD-10-CM | POA: Diagnosis not present

## 2014-12-20 DIAGNOSIS — N186 End stage renal disease: Secondary | ICD-10-CM | POA: Diagnosis not present

## 2014-12-20 DIAGNOSIS — D509 Iron deficiency anemia, unspecified: Secondary | ICD-10-CM | POA: Diagnosis not present

## 2014-12-21 ENCOUNTER — Encounter: Payer: Self-pay | Admitting: Orthopedic Surgery

## 2014-12-21 ENCOUNTER — Ambulatory Visit (INDEPENDENT_AMBULATORY_CARE_PROVIDER_SITE_OTHER): Payer: 59 | Admitting: Orthopedic Surgery

## 2014-12-21 VITALS — BP 131/76 | Ht 61.0 in | Wt 185.0 lb

## 2014-12-21 DIAGNOSIS — M47816 Spondylosis without myelopathy or radiculopathy, lumbar region: Secondary | ICD-10-CM

## 2014-12-21 MED ORDER — PREDNISONE (PAK) 5 MG PO TABS: ORAL_TABLET | ORAL | Status: DC

## 2014-12-21 NOTE — Progress Notes (Signed)
Patient ID: Tiffany Golden, female   DOB: Oct 24, 1976, 39 y.o.   MRN: [id number]  Chief Complaint  Patient presents with  . Hip Pain    Left hip pain, no injury. Referred by Dr. Sallee Lange.    HPI Tiffany Golden is a 39 y.o. female.  Presents with history of sided back pain radiating into the upper thigh since 10/26/2014. Symptoms came on suddenly progressively worsened she eventually had MRI she was treated with gabapentin, prednisone oxycodone and Aleve it seems that the prednisone and Aleve help her the most. Her symptoms of gotten better although she did have 9 out of 10 constant throbbing pain. The leg seemed like he wanted to give way on her.  She has not had any physical therapy. She did have MRI. HPI  Review of Systems Review of Systems  Musculoskeletal: Positive for myalgias and back pain.  All other systems reviewed and are negative.    Past Medical History  Diagnosis Date  . Hypertension   . HIV infection   . Chronic kidney disease   . Anemia   . Dialysis patient     mon, wed 48  . ESRD (end stage renal disease)   . Stroke   . Complication of anesthesia   . PONV (postoperative nausea and vomiting)   . TIA (transient ischemic attack)     Hx:of    Past Surgical History  Procedure Laterality Date  . Cesarean section    . Arteriovenous graft placement  09/11/11    left arm  . Revison of arteriovenous fistula Left 123456    Procedure: Plication left arm fistula;  Surgeon: Elam Dutch, MD;  Location: [place];  Service: Vascular;  Laterality: Left;  . Insertion of dialysis catheter Right 09/01/2013    Procedure: INSERTION OF DIALYSIS CATHETER Right Internal Jugular;  Surgeon: Elam Dutch, MD;  Location: [place];  Service: Vascular;  Laterality: Right;  . Patch angioplasty Left 09/01/2013    Procedure: PATCH ANGIOPLASTY;  Surgeon: Elam Dutch, MD;  Location: [facility] OR;  Service: Vascular;  Laterality: Left;    Family History  Problem Relation Age of  Onset  . Hyperlipidemia Mother   . Cancer - Other Mother     Hx partial hysterectomy  . Heart disease Father     Hx CABG    Social History History  Substance Use Topics  . Smoking status: Former Smoker -- 3 years    Types: Cigarettes    Quit date: 09/05/2009  . Smokeless tobacco: Never Used  . Alcohol Use: No    Allergies  Allergen Reactions  . Sulfa Antibiotics Hives  . Fortaz [Ceftazidime Sodium In D5w] Rash    Head-toe  . Vancomycin Rash    Head-toe    Current Outpatient Prescriptions  Medication Sig Dispense Refill  . abacavir (ZIAGEN) 300 MG tablet Take 2 tablets (600 mg total) by mouth daily. 60 tablet 11  . B Complex-C-Folic Acid (RENA-VITE RX) 1 MG TABS Take 1 tablet by mouth daily.    Marland Kitchen EPIVIR 10 MG/ML solution TAKE 2.5ML BY MOUTH EVERY DAY 240 mL 5  . gabapentin (NEURONTIN) 100 MG capsule TAKE 1 CAPSULE BY MOUTH TWICE DAILY (Patient not taking: Reported on 12/21/2014) 60 capsule 0  . metoprolol tartrate (LOPRESSOR) 25 MG tablet Take 25 mg by mouth See admin instructions. Takes twice daily, except takes none on dialysis days (Tuesday, Thursday, and Sunday).    Marland Kitchen oxyCODONE-acetaminophen (PERCOCET) 7.5-325 MG per tablet Take 1  tablet by mouth every 6 (six) hours as needed for pain. (Patient not taking: Reported on 12/21/2014) 24 tablet 0  . predniSONE (DELTASONE) 20 MG tablet 3 po qd x 3 d then 2 po qd x 3 d then 1 po qd x 3 d (Patient not taking: Reported on 12/21/2014) 18 tablet 0  . predniSONE (STERAPRED UNI-PAK) 5 MG TABS tablet Use as directed 48 tablet 0  . TIVICAY 50 MG tablet TAKE 1 TABLET BY MOUTH TWICE DAILY 60 tablet 0   No current facility-administered medications for this visit.       Physical Exam Blood pressure 131/76, height 5\' 1"  (1.549 m), weight 185 lb (83.915 kg). Physical Exam VS BP 131/76 mmHg  Ht 5\' 1"  (1.549 m)  Wt 185 lb (83.915 kg)  BMI 34.97 kg/m2  Gen. appearance is normal The patient is alert and oriented person place and time Mood  is normal affect is normal Ambulatory status normal Tenderness is noted in the left lumbar region and left buttock. The right and left hip normal range of motion. Both hips are stable both knees are stable. Motor exam is normal bilaterally. Skin lumbar and lower extremities normal. Pulses normal in both legs. No sensory deficits normal 2+ reflexes negative straight leg raises bilaterally.  Data Reviewed Disc MRI and report L5-S1 facet arthritis without disc herniation  Assessment    Encounter Diagnosis  Name Primary?  . Facet arthritis of lumbar region Yes        Plan    She would like another prednisone Dosepak she's placed in a 5 mg 12 day pack She will be sent to Dr. Francesco Runner for further injection therapy

## 2014-12-21 NOTE — Patient Instructions (Addendum)
Referral to Dr Francesco Runner for eval for possible injections and management of pain   Call Dr Wolfgang Phoenix to get further prescriptions

## 2014-12-22 ENCOUNTER — Other Ambulatory Visit: Payer: Self-pay | Admitting: *Deleted

## 2014-12-22 ENCOUNTER — Telehealth: Payer: Self-pay | Admitting: *Deleted

## 2014-12-22 DIAGNOSIS — M47816 Spondylosis without myelopathy or radiculopathy, lumbar region: Secondary | ICD-10-CM

## 2014-12-22 DIAGNOSIS — Z23 Encounter for immunization: Secondary | ICD-10-CM | POA: Diagnosis not present

## 2014-12-22 DIAGNOSIS — D509 Iron deficiency anemia, unspecified: Secondary | ICD-10-CM | POA: Diagnosis not present

## 2014-12-22 DIAGNOSIS — N186 End stage renal disease: Secondary | ICD-10-CM | POA: Diagnosis not present

## 2014-12-22 DIAGNOSIS — N2581 Secondary hyperparathyroidism of renal origin: Secondary | ICD-10-CM | POA: Diagnosis not present

## 2014-12-22 NOTE — Telephone Encounter (Signed)
REFERRAL SENT TO DR O'TOOLE FOR ESI

## 2014-12-24 DIAGNOSIS — Z23 Encounter for immunization: Secondary | ICD-10-CM | POA: Diagnosis not present

## 2014-12-24 DIAGNOSIS — N2581 Secondary hyperparathyroidism of renal origin: Secondary | ICD-10-CM | POA: Diagnosis not present

## 2014-12-24 DIAGNOSIS — D509 Iron deficiency anemia, unspecified: Secondary | ICD-10-CM | POA: Diagnosis not present

## 2014-12-24 DIAGNOSIS — N186 End stage renal disease: Secondary | ICD-10-CM | POA: Diagnosis not present

## 2014-12-27 DIAGNOSIS — N186 End stage renal disease: Secondary | ICD-10-CM | POA: Diagnosis not present

## 2014-12-27 DIAGNOSIS — Z23 Encounter for immunization: Secondary | ICD-10-CM | POA: Diagnosis not present

## 2014-12-27 DIAGNOSIS — D509 Iron deficiency anemia, unspecified: Secondary | ICD-10-CM | POA: Diagnosis not present

## 2014-12-27 DIAGNOSIS — N2581 Secondary hyperparathyroidism of renal origin: Secondary | ICD-10-CM | POA: Diagnosis not present

## 2014-12-28 ENCOUNTER — Other Ambulatory Visit: Payer: Self-pay

## 2014-12-28 ENCOUNTER — Ambulatory Visit: Payer: 59 | Admitting: Infectious Disease

## 2014-12-29 DIAGNOSIS — N2581 Secondary hyperparathyroidism of renal origin: Secondary | ICD-10-CM | POA: Diagnosis not present

## 2014-12-29 DIAGNOSIS — D509 Iron deficiency anemia, unspecified: Secondary | ICD-10-CM | POA: Diagnosis not present

## 2014-12-29 DIAGNOSIS — Z23 Encounter for immunization: Secondary | ICD-10-CM | POA: Diagnosis not present

## 2014-12-29 DIAGNOSIS — N186 End stage renal disease: Secondary | ICD-10-CM | POA: Diagnosis not present

## 2014-12-30 ENCOUNTER — Other Ambulatory Visit: Payer: 59

## 2014-12-30 DIAGNOSIS — B20 Human immunodeficiency virus [HIV] disease: Secondary | ICD-10-CM

## 2014-12-30 DIAGNOSIS — Z79899 Other long term (current) drug therapy: Secondary | ICD-10-CM

## 2014-12-30 LAB — COMPLETE METABOLIC PANEL WITH GFR
ALBUMIN: 3.9 g/dL (ref 3.5–5.2)
ALT: 10 U/L (ref 0–35)
AST: 14 U/L (ref 0–37)
Alkaline Phosphatase: 52 U/L (ref 39–117)
BUN: 20 mg/dL (ref 6–23)
CALCIUM: 10.2 mg/dL (ref 8.4–10.5)
CHLORIDE: 91 meq/L — AB (ref 96–112)
CO2: 32 meq/L (ref 19–32)
Creat: 6.56 mg/dL — ABNORMAL HIGH (ref 0.50–1.10)
GFR, EST AFRICAN AMERICAN: 8 mL/min — AB
GFR, Est Non African American: 7 mL/min — ABNORMAL LOW
Glucose, Bld: 124 mg/dL — ABNORMAL HIGH (ref 70–99)
Potassium: 4.7 mEq/L (ref 3.5–5.3)
SODIUM: 138 meq/L (ref 135–145)
TOTAL PROTEIN: 7.2 g/dL (ref 6.0–8.3)
Total Bilirubin: 0.4 mg/dL (ref 0.2–1.2)

## 2014-12-30 LAB — LIPID PANEL
CHOLESTEROL: 211 mg/dL — AB (ref 0–200)
HDL: 44 mg/dL (ref >39)
LDL CALC: 139 mg/dL — AB (ref 0–99)
Total CHOL/HDL Ratio: 4.8 Ratio
Triglycerides: 142 mg/dL (ref <150)
VLDL: 28 mg/dL (ref 0–40)

## 2014-12-30 LAB — CBC WITH DIFFERENTIAL/PLATELET
BASOS ABS: 0.1 10*3/uL (ref 0.0–0.1)
BASOS PCT: 1 % (ref 0–1)
EOS PCT: 2 % (ref 0–5)
Eosinophils Absolute: 0.2 10*3/uL (ref 0.0–0.7)
HCT: 39.8 % (ref 36.0–46.0)
HEMOGLOBIN: 13.8 g/dL (ref 12.0–15.0)
Lymphocytes Relative: 23 % (ref 12–46)
Lymphs Abs: 2.6 10*3/uL (ref 0.7–4.0)
MCH: 32.5 pg (ref 26.0–34.0)
MCHC: 34.7 g/dL (ref 30.0–36.0)
MCV: 93.9 fL (ref 78.0–100.0)
MPV: 8.4 fL — ABNORMAL LOW (ref 8.6–12.4)
Monocytes Absolute: 0.7 10*3/uL (ref 0.1–1.0)
Monocytes Relative: 6 % (ref 3–12)
NEUTROS ABS: 7.8 10*3/uL — AB (ref 1.7–7.7)
Neutrophils Relative %: 68 % (ref 43–77)
PLATELETS: 397 10*3/uL (ref 150–400)
RBC: 4.24 MIL/uL (ref 3.87–5.11)
RDW: 14.8 % (ref 11.5–15.5)
WBC: 11.5 10*3/uL — AB (ref 4.0–10.5)

## 2014-12-30 NOTE — Addendum Note (Signed)
Addended byMarlis Edelson on: 12/30/2014 11:02 AM   Modules accepted: Orders

## 2014-12-31 DIAGNOSIS — N2581 Secondary hyperparathyroidism of renal origin: Secondary | ICD-10-CM | POA: Diagnosis not present

## 2014-12-31 DIAGNOSIS — N186 End stage renal disease: Secondary | ICD-10-CM | POA: Diagnosis not present

## 2014-12-31 DIAGNOSIS — D509 Iron deficiency anemia, unspecified: Secondary | ICD-10-CM | POA: Diagnosis not present

## 2014-12-31 DIAGNOSIS — Z23 Encounter for immunization: Secondary | ICD-10-CM | POA: Diagnosis not present

## 2014-12-31 LAB — T-HELPER CELL (CD4) - (RCID CLINIC ONLY)
CD4 T CELL HELPER: 16 % — AB (ref 33–55)
CD4 T Cell Abs: 500 /uL (ref 400–2700)

## 2014-12-31 LAB — HIV-1 RNA QUANT-NO REFLEX-BLD
HIV 1 RNA QUANT: 78 {copies}/mL — AB (ref <20)
HIV-1 RNA QUANT, LOG: 1.89 {Log} — AB (ref <1.30)

## 2015-01-03 DIAGNOSIS — N186 End stage renal disease: Secondary | ICD-10-CM | POA: Diagnosis not present

## 2015-01-03 DIAGNOSIS — Z23 Encounter for immunization: Secondary | ICD-10-CM | POA: Diagnosis not present

## 2015-01-03 DIAGNOSIS — N2581 Secondary hyperparathyroidism of renal origin: Secondary | ICD-10-CM | POA: Diagnosis not present

## 2015-01-03 DIAGNOSIS — D509 Iron deficiency anemia, unspecified: Secondary | ICD-10-CM | POA: Diagnosis not present

## 2015-01-05 DIAGNOSIS — D509 Iron deficiency anemia, unspecified: Secondary | ICD-10-CM | POA: Diagnosis not present

## 2015-01-05 DIAGNOSIS — Z23 Encounter for immunization: Secondary | ICD-10-CM | POA: Diagnosis not present

## 2015-01-05 DIAGNOSIS — N186 End stage renal disease: Secondary | ICD-10-CM | POA: Diagnosis not present

## 2015-01-05 DIAGNOSIS — N2581 Secondary hyperparathyroidism of renal origin: Secondary | ICD-10-CM | POA: Diagnosis not present

## 2015-01-07 DIAGNOSIS — N186 End stage renal disease: Secondary | ICD-10-CM | POA: Diagnosis not present

## 2015-01-07 DIAGNOSIS — N2581 Secondary hyperparathyroidism of renal origin: Secondary | ICD-10-CM | POA: Diagnosis not present

## 2015-01-07 DIAGNOSIS — D509 Iron deficiency anemia, unspecified: Secondary | ICD-10-CM | POA: Diagnosis not present

## 2015-01-07 DIAGNOSIS — Z23 Encounter for immunization: Secondary | ICD-10-CM | POA: Diagnosis not present

## 2015-01-07 NOTE — Telephone Encounter (Signed)
REFERRAL RECEIVED, IN REVIEW

## 2015-01-10 DIAGNOSIS — N2581 Secondary hyperparathyroidism of renal origin: Secondary | ICD-10-CM | POA: Diagnosis not present

## 2015-01-10 DIAGNOSIS — N186 End stage renal disease: Secondary | ICD-10-CM | POA: Diagnosis not present

## 2015-01-10 DIAGNOSIS — D509 Iron deficiency anemia, unspecified: Secondary | ICD-10-CM | POA: Diagnosis not present

## 2015-01-10 DIAGNOSIS — Z23 Encounter for immunization: Secondary | ICD-10-CM | POA: Diagnosis not present

## 2015-01-10 DIAGNOSIS — Z992 Dependence on renal dialysis: Secondary | ICD-10-CM | POA: Diagnosis not present

## 2015-01-12 ENCOUNTER — Ambulatory Visit (INDEPENDENT_AMBULATORY_CARE_PROVIDER_SITE_OTHER): Payer: 59 | Admitting: Infectious Disease

## 2015-01-12 ENCOUNTER — Encounter: Payer: Self-pay | Admitting: Infectious Disease

## 2015-01-12 VITALS — BP 115/78 | HR 91 | Temp 97.8°F | Wt 188.0 lb

## 2015-01-12 DIAGNOSIS — N186 End stage renal disease: Secondary | ICD-10-CM | POA: Diagnosis not present

## 2015-01-12 DIAGNOSIS — Z21 Asymptomatic human immunodeficiency virus [HIV] infection status: Secondary | ICD-10-CM

## 2015-01-12 DIAGNOSIS — Z992 Dependence on renal dialysis: Secondary | ICD-10-CM

## 2015-01-12 DIAGNOSIS — I10 Essential (primary) hypertension: Secondary | ICD-10-CM

## 2015-01-12 DIAGNOSIS — D509 Iron deficiency anemia, unspecified: Secondary | ICD-10-CM | POA: Diagnosis not present

## 2015-01-12 DIAGNOSIS — B20 Human immunodeficiency virus [HIV] disease: Secondary | ICD-10-CM

## 2015-01-12 DIAGNOSIS — N2581 Secondary hyperparathyroidism of renal origin: Secondary | ICD-10-CM | POA: Diagnosis not present

## 2015-01-12 MED ORDER — DOLUTEGRAVIR SODIUM 50 MG PO TABS: 50.0000 mg | ORAL_TABLET | Freq: Every day | ORAL | Status: DC

## 2015-01-12 NOTE — Progress Notes (Signed)
Subjective:    Patient ID: Tiffany Golden, female    DOB: 1976-02-24, 39 y.o.   MRN: [id number]  HPI  39  y.o. female past medical history significant for HIV and AIDS that was diagnosed approximately 18 years ago. Patient was initially treated for HIV while pregnant. She was followed initially at [facility] and was involved in a clinical trials there. Last notes from [facility] indicated that she had thought of care between 2002 and 2010. When last seen she had undetectable viral load and healthy CD4 count and was on a regimen of Crixivan 800 mg BID, D4T 40 mg BID, Epivir 150 mg BID    She does also have comorbid end-stage renal disease and is on hemodialysis since 2012 I met her in the hospital and ultimately restarted on TIVICAY bid and renally dosed epvir as well as wekly viread, then changed to daily ABC, epivir and BID Tivicay  Her VL has typically been undetectable and CD4 is >300. He has had a few "blips" including her most recent viral load of 78.  Lab Results  Component Value Date   HIV1RNAQUANT 78* 12/30/2014    Lab Results  Component Value Date   CD4TABS 500 12/30/2014   CD4TABS 380* 08/03/2014   CD4TABS 340* 02/04/2014     She continues to be followed by Dr. Moshe Cipro and Dr. Jimmy Footman and also is seen by primary care physician. SHe is seeing Mallory Shirk for Ob/GYn care.  She is being evaluated by [facility]  for possible kidney transplant.    Review of Systems  Constitutional: Negative for fever, chills, diaphoresis, activity change, appetite change, fatigue and unexpected weight change.  HENT: Negative for congestion, rhinorrhea, sinus pressure, sneezing, sore throat and trouble swallowing.   Eyes: Negative for photophobia and visual disturbance.  Respiratory: Negative for cough, chest tightness, shortness of breath, wheezing and stridor.   Cardiovascular: Negative for chest pain, palpitations and leg swelling.  Gastrointestinal: Negative for nausea,  vomiting, abdominal pain, diarrhea, constipation, blood in stool, abdominal distention and anal bleeding.  Genitourinary: Negative for dysuria, hematuria, flank pain and difficulty urinating.  Musculoskeletal: Negative for myalgias, back pain, joint swelling, arthralgias and gait problem.  Skin: Negative for color change, pallor, rash and wound.  Neurological: Negative for dizziness, tremors, weakness and light-headedness.  Hematological: Negative for adenopathy. Does not bruise/bleed easily.  Psychiatric/Behavioral: Negative for behavioral problems, confusion, sleep disturbance, dysphoric mood, decreased concentration and agitation.       Objective:   Physical Exam  Constitutional: She is oriented to person, place, and time. She appears well-developed and well-nourished. No distress.  HENT:  Head: Normocephalic and atraumatic.  Mouth/Throat: No oropharyngeal exudate.  Eyes: Conjunctivae and EOM are normal. No scleral icterus.  Neck: Normal range of motion. Neck supple.  Cardiovascular: Normal rate and regular rhythm.   Pulmonary/Chest: Effort normal. No respiratory distress. She has no wheezes.  Abdominal: She exhibits no distension.  Musculoskeletal: She exhibits no edema or tenderness.  Neurological: She is alert and oriented to person, place, and time. She exhibits normal muscle tone. Coordination normal.  Skin: Skin is warm and dry. No rash noted. She is not diaphoretic. No erythema. No pallor.  Psychiatric: She has a normal mood and affect. Her behavior is normal. Judgment and thought content normal.          Assessment & Plan:  HIV and AIDS: Doing a fantastic job a lot to change her anti-retroviral regimen, Based on review of data from poster  at [place] 2016 which I reviewed last week showing no appreciable changes and dietary over levels post hemodialysis and no clear reason for dose adjustment based on that study for dolutegravir and after review of pharmacokinetics of DTG by  Onnie Boer with ViiV we felt safe changing her to daily TIVICAY along with her abacavir and lamivudine.   I spent greater than 25  minutes with the patient including greater than 50% of time in face to face counsel of the patient and in coordination of their care.   Hypertension : normotensive today  ESRD on HD: --continue HD and awaiting kidney transplantation evaluation

## 2015-01-14 DIAGNOSIS — N186 End stage renal disease: Secondary | ICD-10-CM | POA: Diagnosis not present

## 2015-01-14 DIAGNOSIS — N2581 Secondary hyperparathyroidism of renal origin: Secondary | ICD-10-CM | POA: Diagnosis not present

## 2015-01-14 DIAGNOSIS — D509 Iron deficiency anemia, unspecified: Secondary | ICD-10-CM | POA: Diagnosis not present

## 2015-01-17 DIAGNOSIS — N186 End stage renal disease: Secondary | ICD-10-CM | POA: Diagnosis not present

## 2015-01-17 DIAGNOSIS — N2581 Secondary hyperparathyroidism of renal origin: Secondary | ICD-10-CM | POA: Diagnosis not present

## 2015-01-17 DIAGNOSIS — D509 Iron deficiency anemia, unspecified: Secondary | ICD-10-CM | POA: Diagnosis not present

## 2015-01-19 DIAGNOSIS — N186 End stage renal disease: Secondary | ICD-10-CM | POA: Diagnosis not present

## 2015-01-19 DIAGNOSIS — N2581 Secondary hyperparathyroidism of renal origin: Secondary | ICD-10-CM | POA: Diagnosis not present

## 2015-01-19 DIAGNOSIS — D509 Iron deficiency anemia, unspecified: Secondary | ICD-10-CM | POA: Diagnosis not present

## 2015-01-21 DIAGNOSIS — N186 End stage renal disease: Secondary | ICD-10-CM | POA: Diagnosis not present

## 2015-01-21 DIAGNOSIS — D509 Iron deficiency anemia, unspecified: Secondary | ICD-10-CM | POA: Diagnosis not present

## 2015-01-21 DIAGNOSIS — N2581 Secondary hyperparathyroidism of renal origin: Secondary | ICD-10-CM | POA: Diagnosis not present

## 2015-01-24 DIAGNOSIS — N2581 Secondary hyperparathyroidism of renal origin: Secondary | ICD-10-CM | POA: Diagnosis not present

## 2015-01-24 DIAGNOSIS — D509 Iron deficiency anemia, unspecified: Secondary | ICD-10-CM | POA: Diagnosis not present

## 2015-01-24 DIAGNOSIS — N186 End stage renal disease: Secondary | ICD-10-CM | POA: Diagnosis not present

## 2015-01-26 DIAGNOSIS — D509 Iron deficiency anemia, unspecified: Secondary | ICD-10-CM | POA: Diagnosis not present

## 2015-01-26 DIAGNOSIS — N2581 Secondary hyperparathyroidism of renal origin: Secondary | ICD-10-CM | POA: Diagnosis not present

## 2015-01-26 DIAGNOSIS — N186 End stage renal disease: Secondary | ICD-10-CM | POA: Diagnosis not present

## 2015-01-28 DIAGNOSIS — D509 Iron deficiency anemia, unspecified: Secondary | ICD-10-CM | POA: Diagnosis not present

## 2015-01-28 DIAGNOSIS — N2581 Secondary hyperparathyroidism of renal origin: Secondary | ICD-10-CM | POA: Diagnosis not present

## 2015-01-28 DIAGNOSIS — N186 End stage renal disease: Secondary | ICD-10-CM | POA: Diagnosis not present

## 2015-01-31 DIAGNOSIS — N186 End stage renal disease: Secondary | ICD-10-CM | POA: Diagnosis not present

## 2015-01-31 DIAGNOSIS — N2581 Secondary hyperparathyroidism of renal origin: Secondary | ICD-10-CM | POA: Diagnosis not present

## 2015-01-31 DIAGNOSIS — D509 Iron deficiency anemia, unspecified: Secondary | ICD-10-CM | POA: Diagnosis not present

## 2015-02-02 ENCOUNTER — Encounter: Payer: Self-pay | Admitting: Family Medicine

## 2015-02-02 DIAGNOSIS — N186 End stage renal disease: Secondary | ICD-10-CM | POA: Diagnosis not present

## 2015-02-02 DIAGNOSIS — D509 Iron deficiency anemia, unspecified: Secondary | ICD-10-CM | POA: Diagnosis not present

## 2015-02-02 DIAGNOSIS — M4716 Other spondylosis with myelopathy, lumbar region: Secondary | ICD-10-CM | POA: Insufficient documentation

## 2015-02-02 DIAGNOSIS — N2581 Secondary hyperparathyroidism of renal origin: Secondary | ICD-10-CM | POA: Diagnosis not present

## 2015-02-04 DIAGNOSIS — D509 Iron deficiency anemia, unspecified: Secondary | ICD-10-CM | POA: Diagnosis not present

## 2015-02-04 DIAGNOSIS — N186 End stage renal disease: Secondary | ICD-10-CM | POA: Diagnosis not present

## 2015-02-04 DIAGNOSIS — N2581 Secondary hyperparathyroidism of renal origin: Secondary | ICD-10-CM | POA: Diagnosis not present

## 2015-02-07 DIAGNOSIS — D509 Iron deficiency anemia, unspecified: Secondary | ICD-10-CM | POA: Diagnosis not present

## 2015-02-07 DIAGNOSIS — N2581 Secondary hyperparathyroidism of renal origin: Secondary | ICD-10-CM | POA: Diagnosis not present

## 2015-02-07 DIAGNOSIS — N186 End stage renal disease: Secondary | ICD-10-CM | POA: Diagnosis not present

## 2015-02-09 DIAGNOSIS — N186 End stage renal disease: Secondary | ICD-10-CM | POA: Diagnosis not present

## 2015-02-09 DIAGNOSIS — D509 Iron deficiency anemia, unspecified: Secondary | ICD-10-CM | POA: Diagnosis not present

## 2015-02-09 DIAGNOSIS — N2581 Secondary hyperparathyroidism of renal origin: Secondary | ICD-10-CM | POA: Diagnosis not present

## 2015-02-10 DIAGNOSIS — Z992 Dependence on renal dialysis: Secondary | ICD-10-CM | POA: Diagnosis not present

## 2015-02-10 DIAGNOSIS — N186 End stage renal disease: Secondary | ICD-10-CM | POA: Diagnosis not present

## 2015-02-10 NOTE — Telephone Encounter (Signed)
APPT 01/27/15 OTOOLE

## 2015-02-11 DIAGNOSIS — D509 Iron deficiency anemia, unspecified: Secondary | ICD-10-CM | POA: Diagnosis not present

## 2015-02-11 DIAGNOSIS — N2581 Secondary hyperparathyroidism of renal origin: Secondary | ICD-10-CM | POA: Diagnosis not present

## 2015-02-11 DIAGNOSIS — N186 End stage renal disease: Secondary | ICD-10-CM | POA: Diagnosis not present

## 2015-02-14 DIAGNOSIS — D509 Iron deficiency anemia, unspecified: Secondary | ICD-10-CM | POA: Diagnosis not present

## 2015-02-14 DIAGNOSIS — N186 End stage renal disease: Secondary | ICD-10-CM | POA: Diagnosis not present

## 2015-02-14 DIAGNOSIS — N2581 Secondary hyperparathyroidism of renal origin: Secondary | ICD-10-CM | POA: Diagnosis not present

## 2015-02-16 DIAGNOSIS — D509 Iron deficiency anemia, unspecified: Secondary | ICD-10-CM | POA: Diagnosis not present

## 2015-02-16 DIAGNOSIS — N2581 Secondary hyperparathyroidism of renal origin: Secondary | ICD-10-CM | POA: Diagnosis not present

## 2015-02-16 DIAGNOSIS — N186 End stage renal disease: Secondary | ICD-10-CM | POA: Diagnosis not present

## 2015-02-17 ENCOUNTER — Ambulatory Visit: Payer: 59 | Admitting: Adult Health

## 2015-02-18 DIAGNOSIS — D509 Iron deficiency anemia, unspecified: Secondary | ICD-10-CM | POA: Diagnosis not present

## 2015-02-18 DIAGNOSIS — N186 End stage renal disease: Secondary | ICD-10-CM | POA: Diagnosis not present

## 2015-02-18 DIAGNOSIS — N2581 Secondary hyperparathyroidism of renal origin: Secondary | ICD-10-CM | POA: Diagnosis not present

## 2015-02-21 DIAGNOSIS — D509 Iron deficiency anemia, unspecified: Secondary | ICD-10-CM | POA: Diagnosis not present

## 2015-02-21 DIAGNOSIS — N186 End stage renal disease: Secondary | ICD-10-CM | POA: Diagnosis not present

## 2015-02-21 DIAGNOSIS — N2581 Secondary hyperparathyroidism of renal origin: Secondary | ICD-10-CM | POA: Diagnosis not present

## 2015-02-22 ENCOUNTER — Ambulatory Visit: Payer: 59 | Admitting: Adult Health

## 2015-02-23 DIAGNOSIS — D509 Iron deficiency anemia, unspecified: Secondary | ICD-10-CM | POA: Diagnosis not present

## 2015-02-23 DIAGNOSIS — N2581 Secondary hyperparathyroidism of renal origin: Secondary | ICD-10-CM | POA: Diagnosis not present

## 2015-02-23 DIAGNOSIS — N186 End stage renal disease: Secondary | ICD-10-CM | POA: Diagnosis not present

## 2015-02-25 DIAGNOSIS — N2581 Secondary hyperparathyroidism of renal origin: Secondary | ICD-10-CM | POA: Diagnosis not present

## 2015-02-25 DIAGNOSIS — N186 End stage renal disease: Secondary | ICD-10-CM | POA: Diagnosis not present

## 2015-02-25 DIAGNOSIS — D509 Iron deficiency anemia, unspecified: Secondary | ICD-10-CM | POA: Diagnosis not present

## 2015-02-28 DIAGNOSIS — N186 End stage renal disease: Secondary | ICD-10-CM | POA: Diagnosis not present

## 2015-02-28 DIAGNOSIS — D509 Iron deficiency anemia, unspecified: Secondary | ICD-10-CM | POA: Diagnosis not present

## 2015-02-28 DIAGNOSIS — N2581 Secondary hyperparathyroidism of renal origin: Secondary | ICD-10-CM | POA: Diagnosis not present

## 2015-03-01 DIAGNOSIS — Z79899 Other long term (current) drug therapy: Secondary | ICD-10-CM | POA: Diagnosis not present

## 2015-03-01 DIAGNOSIS — Z21 Asymptomatic human immunodeficiency virus [HIV] infection status: Secondary | ICD-10-CM | POA: Diagnosis not present

## 2015-03-01 DIAGNOSIS — Z882 Allergy status to sulfonamides status: Secondary | ICD-10-CM | POA: Diagnosis not present

## 2015-03-01 DIAGNOSIS — Z883 Allergy status to other anti-infective agents status: Secondary | ICD-10-CM | POA: Diagnosis not present

## 2015-03-01 DIAGNOSIS — Z8673 Personal history of transient ischemic attack (TIA), and cerebral infarction without residual deficits: Secondary | ICD-10-CM | POA: Diagnosis not present

## 2015-03-01 DIAGNOSIS — N186 End stage renal disease: Secondary | ICD-10-CM | POA: Diagnosis not present

## 2015-03-01 DIAGNOSIS — G8929 Other chronic pain: Secondary | ICD-10-CM | POA: Diagnosis not present

## 2015-03-01 DIAGNOSIS — Z992 Dependence on renal dialysis: Secondary | ICD-10-CM | POA: Diagnosis not present

## 2015-03-01 DIAGNOSIS — M47816 Spondylosis without myelopathy or radiculopathy, lumbar region: Secondary | ICD-10-CM | POA: Diagnosis not present

## 2015-03-01 DIAGNOSIS — I120 Hypertensive chronic kidney disease with stage 5 chronic kidney disease or end stage renal disease: Secondary | ICD-10-CM | POA: Diagnosis not present

## 2015-03-02 ENCOUNTER — Ambulatory Visit: Payer: 59 | Admitting: Obstetrics and Gynecology

## 2015-03-02 DIAGNOSIS — D509 Iron deficiency anemia, unspecified: Secondary | ICD-10-CM | POA: Diagnosis not present

## 2015-03-02 DIAGNOSIS — N186 End stage renal disease: Secondary | ICD-10-CM | POA: Diagnosis not present

## 2015-03-02 DIAGNOSIS — N2581 Secondary hyperparathyroidism of renal origin: Secondary | ICD-10-CM | POA: Diagnosis not present

## 2015-03-04 DIAGNOSIS — N2581 Secondary hyperparathyroidism of renal origin: Secondary | ICD-10-CM | POA: Diagnosis not present

## 2015-03-04 DIAGNOSIS — D509 Iron deficiency anemia, unspecified: Secondary | ICD-10-CM | POA: Diagnosis not present

## 2015-03-04 DIAGNOSIS — N186 End stage renal disease: Secondary | ICD-10-CM | POA: Diagnosis not present

## 2015-03-07 DIAGNOSIS — N2581 Secondary hyperparathyroidism of renal origin: Secondary | ICD-10-CM | POA: Diagnosis not present

## 2015-03-07 DIAGNOSIS — N186 End stage renal disease: Secondary | ICD-10-CM | POA: Diagnosis not present

## 2015-03-07 DIAGNOSIS — D509 Iron deficiency anemia, unspecified: Secondary | ICD-10-CM | POA: Diagnosis not present

## 2015-03-09 DIAGNOSIS — N2581 Secondary hyperparathyroidism of renal origin: Secondary | ICD-10-CM | POA: Diagnosis not present

## 2015-03-09 DIAGNOSIS — N186 End stage renal disease: Secondary | ICD-10-CM | POA: Diagnosis not present

## 2015-03-09 DIAGNOSIS — D509 Iron deficiency anemia, unspecified: Secondary | ICD-10-CM | POA: Diagnosis not present

## 2015-03-10 ENCOUNTER — Encounter: Payer: Self-pay | Admitting: Obstetrics and Gynecology

## 2015-03-10 ENCOUNTER — Ambulatory Visit (INDEPENDENT_AMBULATORY_CARE_PROVIDER_SITE_OTHER): Payer: Medicare Other | Admitting: Obstetrics and Gynecology

## 2015-03-10 ENCOUNTER — Other Ambulatory Visit (HOSPITAL_COMMUNITY)
Admission: RE | Admit: 2015-03-10 | Discharge: 2015-03-10 | Disposition: A | Discharge status: Home / Self Care | Payer: 59 | Source: Ambulatory Visit | Attending: Obstetrics and Gynecology | Admitting: Obstetrics and Gynecology

## 2015-03-10 VITALS — BP 110/78 | Ht 61.0 in | Wt 196.0 lb

## 2015-03-10 DIAGNOSIS — Z1151 Encounter for screening for human papillomavirus (HPV): Secondary | ICD-10-CM | POA: Diagnosis present

## 2015-03-10 DIAGNOSIS — Z124 Encounter for screening for malignant neoplasm of cervix: Secondary | ICD-10-CM

## 2015-03-10 DIAGNOSIS — Z01419 Encounter for gynecological examination (general) (routine) without abnormal findings: Secondary | ICD-10-CM | POA: Insufficient documentation

## 2015-03-10 NOTE — Progress Notes (Signed)
Patient ID: Tiffany Golden, female   DOB: October 27, 1976, 39 y.o.   MRN: [id number]  This chart was scribed for Tiffany Kind, MD by Donato Schultz, ED Scribe. This patient was seen in Room 2 and the patient's care was started at 3:35 PM.   Assessment:  Annual Gyn Exam   Plan:  1. Pap smear done, next pap due 5 years 2. Return annually or prn 3.   Annual mammogram advised 4.   Pelvic u/s for fibroids 5. Anemia due to chronic kidney disease Subjective:  Tiffany Golden is a 39 y.o. female No obstetric history on file. who presents for annual exam. Patient's last menstrual period was 03/01/2015. The patient has no complaints today.  She needs a pap smear today before she can be considered for a kidney transplant.  Her menses have become irregular and heavier.  She is currently on dialysis.    The following portions of the patient's history were reviewed and updated as appropriate: allergies, current medications, past family history, past medical history, past social history, past surgical history and problem list. Past Medical History  Diagnosis Date  . Hypertension   . HIV infection   . Chronic kidney disease   . Anemia   . Dialysis patient     mon, wed 63  . ESRD (end stage renal disease)   . Stroke   . Complication of anesthesia   . PONV (postoperative nausea and vomiting)   . TIA (transient ischemic attack)     Hx:of    Past Surgical History  Procedure Laterality Date  . Cesarean section    . Arteriovenous graft placement  09/11/11    left arm  . Revison of arteriovenous fistula Left [id number]    Procedure: Plication left arm fistula;  Surgeon: Elam Dutch, MD;  Location: [place];  Service: Vascular;  Laterality: Left;  . Insertion of dialysis catheter Right 09/01/2013    Procedure: INSERTION OF DIALYSIS CATHETER Right Internal Jugular;  Surgeon: Elam Dutch, MD;  Location: [place];  Service: Vascular;  Laterality: Right;  . Patch angioplasty Left 09/01/2013   Procedure: PATCH ANGIOPLASTY;  Surgeon: Elam Dutch, MD;  Location: [facility] OR;  Service: Vascular;  Laterality: Left;     Current outpatient prescriptions:  .  abacavir (ZIAGEN) 300 MG tablet, Take 2 tablets (600 mg total) by mouth daily., Disp: 60 tablet, Rfl: 11 .  B Complex-C-Folic Acid (RENA-VITE RX) 1 MG TABS, Take 1 tablet by mouth daily., Disp: , Rfl:  .  dolutegravir (TIVICAY) 50 MG tablet, Take 1 tablet (50 mg total) by mouth daily., Disp: 30 tablet, Rfl: 11 .  EPIVIR 10 MG/ML solution, TAKE 2.5ML BY MOUTH EVERY DAY, Disp: 240 mL, Rfl: 5 .  metoprolol tartrate (LOPRESSOR) 25 MG tablet, Take 25 mg by mouth See admin instructions. Takes twice daily, except takes none on dialysis days (Tuesday, Thursday, and Sunday)., Disp: , Rfl:   Review of Systems Constitutional: negative Gastrointestinal: negative Genitourinary: negative  Objective:  BP 110/78 mmHg  Ht 5\' 1"  (1.549 m)  Wt 196 lb (88.905 kg)  BMI 37.05 kg/m2  LMP 03/01/2015   BMI: Body mass index is 37.05 kg/(m^2).  General Appearance: Alert, appropriate appearance for age. No acute distress HEENT: Grossly normal Neck / Thyroid:  Cardiovascular: RRR; normal S1, S2, no murmur Lungs: CTA bilaterally Back: No CVAT Breast Exam: No masses or nodes.No dimpling, nipple retraction or discharge. Gastrointestinal: Soft, non-tender, no masses or organomegaly Pelvic Exam: Vaginal: normal  mucosa without prolapse, lesions, or tenderness, induration or masses Cervix: normal appearance Adnexa: normal bimanual exam Uterus: iregular, enlarged 12wk size, deviated to left. Rectovaginal: not indicated Lymphatic Exam: Non-palpable nodes in neck, clavicular, axillary, or inguinal regions Skin: no rash or abnormalities Neurologic: Normal gait and speech, no tremor  Psychiatric: Alert and oriented, appropriate affect.  Urinalysis:Not done  Mallory Shirk. MD Pgr [phone] 3:36 PM    I personally performed the services described in  this documentation, which was SCRIBED in my presence. The recorded information has been reviewed and considered accurate. It has been edited as necessary during review. Tiffany Kind, MD

## 2015-03-10 NOTE — Progress Notes (Signed)
Patient ID: Tiffany Golden, female   DOB: Apr 24, 1976, 39 y.o.   MRN: [id number] Pt here today for pap smear. Pt needs this done for review by a transplant board for kidney transplant.

## 2015-03-11 DIAGNOSIS — N186 End stage renal disease: Secondary | ICD-10-CM | POA: Diagnosis not present

## 2015-03-11 DIAGNOSIS — D509 Iron deficiency anemia, unspecified: Secondary | ICD-10-CM | POA: Diagnosis not present

## 2015-03-11 DIAGNOSIS — N2581 Secondary hyperparathyroidism of renal origin: Secondary | ICD-10-CM | POA: Diagnosis not present

## 2015-03-12 DIAGNOSIS — I120 Hypertensive chronic kidney disease with stage 5 chronic kidney disease or end stage renal disease: Secondary | ICD-10-CM | POA: Diagnosis not present

## 2015-03-12 DIAGNOSIS — N186 End stage renal disease: Secondary | ICD-10-CM | POA: Diagnosis not present

## 2015-03-12 DIAGNOSIS — Z992 Dependence on renal dialysis: Secondary | ICD-10-CM | POA: Diagnosis not present

## 2015-03-14 DIAGNOSIS — D509 Iron deficiency anemia, unspecified: Secondary | ICD-10-CM | POA: Diagnosis not present

## 2015-03-14 DIAGNOSIS — N2581 Secondary hyperparathyroidism of renal origin: Secondary | ICD-10-CM | POA: Diagnosis not present

## 2015-03-14 DIAGNOSIS — N186 End stage renal disease: Secondary | ICD-10-CM | POA: Diagnosis not present

## 2015-03-14 LAB — CYTOLOGY - PAP

## 2015-03-15 ENCOUNTER — Other Ambulatory Visit: Payer: Self-pay | Admitting: Obstetrics and Gynecology

## 2015-03-15 DIAGNOSIS — Z1231 Encounter for screening mammogram for malignant neoplasm of breast: Secondary | ICD-10-CM

## 2015-03-16 DIAGNOSIS — N2581 Secondary hyperparathyroidism of renal origin: Secondary | ICD-10-CM | POA: Diagnosis not present

## 2015-03-16 DIAGNOSIS — D509 Iron deficiency anemia, unspecified: Secondary | ICD-10-CM | POA: Diagnosis not present

## 2015-03-16 DIAGNOSIS — N186 End stage renal disease: Secondary | ICD-10-CM | POA: Diagnosis not present

## 2015-03-18 DIAGNOSIS — N2581 Secondary hyperparathyroidism of renal origin: Secondary | ICD-10-CM | POA: Diagnosis not present

## 2015-03-18 DIAGNOSIS — D509 Iron deficiency anemia, unspecified: Secondary | ICD-10-CM | POA: Diagnosis not present

## 2015-03-18 DIAGNOSIS — N186 End stage renal disease: Secondary | ICD-10-CM | POA: Diagnosis not present

## 2015-03-21 DIAGNOSIS — N2581 Secondary hyperparathyroidism of renal origin: Secondary | ICD-10-CM | POA: Diagnosis not present

## 2015-03-21 DIAGNOSIS — D509 Iron deficiency anemia, unspecified: Secondary | ICD-10-CM | POA: Diagnosis not present

## 2015-03-21 DIAGNOSIS — N186 End stage renal disease: Secondary | ICD-10-CM | POA: Diagnosis not present

## 2015-03-22 ENCOUNTER — Other Ambulatory Visit: Payer: Self-pay | Admitting: Obstetrics and Gynecology

## 2015-03-22 DIAGNOSIS — Z124 Encounter for screening for malignant neoplasm of cervix: Secondary | ICD-10-CM

## 2015-03-23 DIAGNOSIS — N186 End stage renal disease: Secondary | ICD-10-CM | POA: Diagnosis not present

## 2015-03-23 DIAGNOSIS — N2581 Secondary hyperparathyroidism of renal origin: Secondary | ICD-10-CM | POA: Diagnosis not present

## 2015-03-23 DIAGNOSIS — D509 Iron deficiency anemia, unspecified: Secondary | ICD-10-CM | POA: Diagnosis not present

## 2015-03-24 ENCOUNTER — Other Ambulatory Visit: Payer: Medicare Other

## 2015-03-24 ENCOUNTER — Ambulatory Visit: Payer: Medicare Other | Admitting: Obstetrics and Gynecology

## 2015-03-25 DIAGNOSIS — N186 End stage renal disease: Secondary | ICD-10-CM | POA: Diagnosis not present

## 2015-03-25 DIAGNOSIS — D509 Iron deficiency anemia, unspecified: Secondary | ICD-10-CM | POA: Diagnosis not present

## 2015-03-25 DIAGNOSIS — N2581 Secondary hyperparathyroidism of renal origin: Secondary | ICD-10-CM | POA: Diagnosis not present

## 2015-03-28 DIAGNOSIS — N186 End stage renal disease: Secondary | ICD-10-CM | POA: Diagnosis not present

## 2015-03-28 DIAGNOSIS — N2581 Secondary hyperparathyroidism of renal origin: Secondary | ICD-10-CM | POA: Diagnosis not present

## 2015-03-28 DIAGNOSIS — D509 Iron deficiency anemia, unspecified: Secondary | ICD-10-CM | POA: Diagnosis not present

## 2015-03-29 ENCOUNTER — Encounter: Payer: Self-pay | Admitting: Obstetrics and Gynecology

## 2015-03-29 ENCOUNTER — Ambulatory Visit (INDEPENDENT_AMBULATORY_CARE_PROVIDER_SITE_OTHER): Payer: Medicare Other

## 2015-03-29 ENCOUNTER — Other Ambulatory Visit: Payer: Self-pay | Admitting: Obstetrics and Gynecology

## 2015-03-29 ENCOUNTER — Ambulatory Visit (INDEPENDENT_AMBULATORY_CARE_PROVIDER_SITE_OTHER): Payer: Medicare Other | Admitting: Obstetrics and Gynecology

## 2015-03-29 VITALS — BP 120/72 | Ht 61.0 in | Wt 193.0 lb

## 2015-03-29 DIAGNOSIS — N8320 Unspecified ovarian cysts: Secondary | ICD-10-CM

## 2015-03-29 DIAGNOSIS — Z01818 Encounter for other preprocedural examination: Secondary | ICD-10-CM | POA: Diagnosis not present

## 2015-03-29 DIAGNOSIS — N920 Excessive and frequent menstruation with regular cycle: Secondary | ICD-10-CM

## 2015-03-29 DIAGNOSIS — N858 Other specified noninflammatory disorders of uterus: Secondary | ICD-10-CM | POA: Diagnosis not present

## 2015-03-29 DIAGNOSIS — Z124 Encounter for screening for malignant neoplasm of cervix: Secondary | ICD-10-CM

## 2015-03-29 NOTE — Patient Instructions (Signed)
Endometrial Biopsy Endometrial biopsy is a procedure in which a tissue sample is taken from inside the uterus. The tissue sample is then looked at under a microscope to see if the tissue is normal or abnormal. The endometrium is the lining of the uterus. This procedure helps determine where you are in your menstrual cycle and how hormone levels are affecting the lining of the uterus. This procedure may also be used to evaluate uterine bleeding or to diagnose endometrial cancer, tuberculosis, polyps, or inflammatory conditions.  LET YOUR HEALTH CARE PROVIDER KNOW ABOUT:  Any allergies you have.  All medicines you are taking, including vitamins, herbs, eye drops, creams, and over-the-counter medicines.  Previous problems you or members of your family have had with the use of anesthetics.  Any blood disorders you have.  Previous surgeries you have had.  Medical conditions you have.  Possibility of pregnancy. RISKS AND COMPLICATIONS Generally, this is a safe procedure. However, as with any procedure, complications can occur. Possible complications include:  Bleeding.  Pelvic infection.  Puncture of the uterine wall with the biopsy device (rare). BEFORE THE PROCEDURE   Keep a record of your menstrual cycles as directed by your health care provider. You may need to schedule your procedure for a specific time in your cycle.  You may want to bring a sanitary pad to wear home after the procedure.  Arrange for someone to drive you home after the procedure if you will be given a medicine to help you relax (sedative). PROCEDURE   You may be given a sedative to relax you.  You will lie on an exam table with your feet and legs supported as in a pelvic exam.  Your health care provider will insert an instrument (speculum) into your vagina to see your cervix.  Your cervix will be cleansed with an antiseptic solution. A medicine (local anesthetic) will be used to numb the cervix.  A forceps  instrument (tenaculum) will be used to hold your cervix steady for the biopsy.  A thin, rodlike instrument (uterine sound) will be inserted through your cervix to determine the length of your uterus and the location where the biopsy sample will be removed.  A thin, flexible tube (catheter) will be inserted through your cervix and into the uterus. The catheter is used to collect the biopsy sample from your endometrial tissue.  The catheter and speculum will then be removed, and the tissue sample will be sent to a lab for examination. AFTER THE PROCEDURE  You will rest in a recovery area until you are ready to go home.  You may have mild cramping and a small amount of vaginal bleeding for a few days after the procedure. This is normal.  Make sure you find out how to get your test results. Document Released: 03/01/2005 Document Revised: 07/01/2013 Document Reviewed: 04/15/2013 Exit[employer] Patient Information 2015 [employer]. This information is not intended to replace advice given to you by your health care provider. Make sure you discuss any questions you have with your health care provider.  

## 2015-03-29 NOTE — Progress Notes (Signed)
Patient ID: Tiffany Golden, female   DOB: 04-13-1976, 39 y.o.   MRN: [id number] Pt here today for follow up and go over results of US done today.  U/s shows a normal endometrium, LMP was 4/19, now approaching menses.  Pt desires to be considered for endometrial ablation in advance of Renal transplant. Endometrial biopsy done as preop w/u for ablation. Endometrial Biopsy: Patient given informed consent, signed copy in the chart, time out was performed. Time out taken. The patient was placed in the lithotomy position and the cervix brought into view with sterile speculum.  Portio of cervix cleansed x 2 with betadine swabs.  A tenaculum was placed in the anterior lip of the cervix. The uterus was sounded for depth of 8 cm,. Milex uterine Explora 3 mm was introduced to into the uterus, suction created,  and an endometrial sample was obtained. All equipment was removed and accounted for.   The patient tolerated the procedure well.    Patient given post procedure instructions.  Followup: 3wk.

## 2015-03-29 NOTE — Progress Notes (Signed)
US PELVIS T/A AND TV: heterogeneous anteverted uterus w/ an echogenic fibroid rt bdy 2.2 x 1.9 x 2.2cm,EEC 7.93mm,rt ov complex cyst 2.9 x 2.1 x 3.3cm, lt ov contains two cysts (#1) simple cyst 8.7 x 8 x 6.2cm (#2) complex cyst 2.8 x 2.7 x 2.6 cm

## 2015-03-30 DIAGNOSIS — D509 Iron deficiency anemia, unspecified: Secondary | ICD-10-CM | POA: Diagnosis not present

## 2015-03-30 DIAGNOSIS — N2581 Secondary hyperparathyroidism of renal origin: Secondary | ICD-10-CM | POA: Diagnosis not present

## 2015-03-30 DIAGNOSIS — N186 End stage renal disease: Secondary | ICD-10-CM | POA: Diagnosis not present

## 2015-03-31 DIAGNOSIS — Z7682 Awaiting organ transplant status: Secondary | ICD-10-CM | POA: Diagnosis not present

## 2015-03-31 DIAGNOSIS — Z21 Asymptomatic human immunodeficiency virus [HIV] infection status: Secondary | ICD-10-CM | POA: Diagnosis not present

## 2015-03-31 DIAGNOSIS — Z992 Dependence on renal dialysis: Secondary | ICD-10-CM | POA: Diagnosis not present

## 2015-03-31 DIAGNOSIS — N186 End stage renal disease: Secondary | ICD-10-CM | POA: Diagnosis not present

## 2015-04-01 DIAGNOSIS — N186 End stage renal disease: Secondary | ICD-10-CM | POA: Diagnosis not present

## 2015-04-01 DIAGNOSIS — D509 Iron deficiency anemia, unspecified: Secondary | ICD-10-CM | POA: Diagnosis not present

## 2015-04-01 DIAGNOSIS — N2581 Secondary hyperparathyroidism of renal origin: Secondary | ICD-10-CM | POA: Diagnosis not present

## 2015-04-04 DIAGNOSIS — N2581 Secondary hyperparathyroidism of renal origin: Secondary | ICD-10-CM | POA: Diagnosis not present

## 2015-04-04 DIAGNOSIS — N186 End stage renal disease: Secondary | ICD-10-CM | POA: Diagnosis not present

## 2015-04-04 DIAGNOSIS — D509 Iron deficiency anemia, unspecified: Secondary | ICD-10-CM | POA: Diagnosis not present

## 2015-04-06 DIAGNOSIS — G8929 Other chronic pain: Secondary | ICD-10-CM | POA: Diagnosis not present

## 2015-04-06 DIAGNOSIS — Z8673 Personal history of transient ischemic attack (TIA), and cerebral infarction without residual deficits: Secondary | ICD-10-CM | POA: Diagnosis not present

## 2015-04-06 DIAGNOSIS — I120 Hypertensive chronic kidney disease with stage 5 chronic kidney disease or end stage renal disease: Secondary | ICD-10-CM | POA: Diagnosis not present

## 2015-04-06 DIAGNOSIS — N2581 Secondary hyperparathyroidism of renal origin: Secondary | ICD-10-CM | POA: Diagnosis not present

## 2015-04-06 DIAGNOSIS — N186 End stage renal disease: Secondary | ICD-10-CM | POA: Diagnosis not present

## 2015-04-06 DIAGNOSIS — Z79899 Other long term (current) drug therapy: Secondary | ICD-10-CM | POA: Diagnosis not present

## 2015-04-06 DIAGNOSIS — Z992 Dependence on renal dialysis: Secondary | ICD-10-CM | POA: Diagnosis not present

## 2015-04-06 DIAGNOSIS — D509 Iron deficiency anemia, unspecified: Secondary | ICD-10-CM | POA: Diagnosis not present

## 2015-04-06 DIAGNOSIS — Z21 Asymptomatic human immunodeficiency virus [HIV] infection status: Secondary | ICD-10-CM | POA: Diagnosis not present

## 2015-04-06 DIAGNOSIS — Z883 Allergy status to other anti-infective agents status: Secondary | ICD-10-CM | POA: Diagnosis not present

## 2015-04-06 DIAGNOSIS — M47816 Spondylosis without myelopathy or radiculopathy, lumbar region: Secondary | ICD-10-CM | POA: Diagnosis not present

## 2015-04-06 DIAGNOSIS — Z882 Allergy status to sulfonamides status: Secondary | ICD-10-CM | POA: Diagnosis not present

## 2015-04-07 NOTE — Progress Notes (Signed)
SCHEDULED NOVASURE FOR 05-03-15 AHB

## 2015-04-08 DIAGNOSIS — N186 End stage renal disease: Secondary | ICD-10-CM | POA: Diagnosis not present

## 2015-04-08 DIAGNOSIS — D509 Iron deficiency anemia, unspecified: Secondary | ICD-10-CM | POA: Diagnosis not present

## 2015-04-08 DIAGNOSIS — N2581 Secondary hyperparathyroidism of renal origin: Secondary | ICD-10-CM | POA: Diagnosis not present

## 2015-04-11 DIAGNOSIS — N2581 Secondary hyperparathyroidism of renal origin: Secondary | ICD-10-CM | POA: Diagnosis not present

## 2015-04-11 DIAGNOSIS — N186 End stage renal disease: Secondary | ICD-10-CM | POA: Diagnosis not present

## 2015-04-11 DIAGNOSIS — D509 Iron deficiency anemia, unspecified: Secondary | ICD-10-CM | POA: Diagnosis not present

## 2015-04-12 DIAGNOSIS — I120 Hypertensive chronic kidney disease with stage 5 chronic kidney disease or end stage renal disease: Secondary | ICD-10-CM | POA: Diagnosis not present

## 2015-04-12 DIAGNOSIS — N186 End stage renal disease: Secondary | ICD-10-CM | POA: Diagnosis not present

## 2015-04-12 DIAGNOSIS — Z992 Dependence on renal dialysis: Secondary | ICD-10-CM | POA: Diagnosis not present

## 2015-04-13 DIAGNOSIS — N186 End stage renal disease: Secondary | ICD-10-CM | POA: Diagnosis not present

## 2015-04-13 DIAGNOSIS — N2581 Secondary hyperparathyroidism of renal origin: Secondary | ICD-10-CM | POA: Diagnosis not present

## 2015-04-13 DIAGNOSIS — D509 Iron deficiency anemia, unspecified: Secondary | ICD-10-CM | POA: Diagnosis not present

## 2015-04-14 ENCOUNTER — Ambulatory Visit (HOSPITAL_COMMUNITY)
Admission: RE | Admit: 2015-04-14 | Discharge: 2015-04-14 | Disposition: A | Discharge status: Home / Self Care | Payer: 59 | Source: Ambulatory Visit | Attending: Obstetrics and Gynecology | Admitting: Obstetrics and Gynecology

## 2015-04-14 DIAGNOSIS — Z1231 Encounter for screening mammogram for malignant neoplasm of breast: Secondary | ICD-10-CM | POA: Diagnosis present

## 2015-04-15 DIAGNOSIS — D509 Iron deficiency anemia, unspecified: Secondary | ICD-10-CM | POA: Diagnosis not present

## 2015-04-15 DIAGNOSIS — N2581 Secondary hyperparathyroidism of renal origin: Secondary | ICD-10-CM | POA: Diagnosis not present

## 2015-04-15 DIAGNOSIS — N186 End stage renal disease: Secondary | ICD-10-CM | POA: Diagnosis not present

## 2015-04-18 DIAGNOSIS — D509 Iron deficiency anemia, unspecified: Secondary | ICD-10-CM | POA: Diagnosis not present

## 2015-04-18 DIAGNOSIS — N186 End stage renal disease: Secondary | ICD-10-CM | POA: Diagnosis not present

## 2015-04-18 DIAGNOSIS — N2581 Secondary hyperparathyroidism of renal origin: Secondary | ICD-10-CM | POA: Diagnosis not present

## 2015-04-19 ENCOUNTER — Encounter: Payer: Self-pay | Admitting: Obstetrics and Gynecology

## 2015-04-19 ENCOUNTER — Ambulatory Visit (INDEPENDENT_AMBULATORY_CARE_PROVIDER_SITE_OTHER): Payer: Medicare Other | Admitting: Obstetrics and Gynecology

## 2015-04-19 VITALS — BP 112/80 | HR 80 | Ht 61.0 in | Wt 193.0 lb

## 2015-04-19 DIAGNOSIS — D631 Anemia in chronic kidney disease: Secondary | ICD-10-CM | POA: Diagnosis not present

## 2015-04-19 DIAGNOSIS — N920 Excessive and frequent menstruation with regular cycle: Secondary | ICD-10-CM

## 2015-04-19 NOTE — Progress Notes (Signed)
Patient ID: Tiffany Golden, female   DOB: [birth year], 39 y.o.   MRN: [id number] Preoperative History and Physical  Tiffany Golden is a 39 y.o. No obstetric history on file. here for surgical management of heavy menses. She has ESRD, and has chronic anemia, will be undergoing renal transplant when available.   No significant preoperative concerns. Pt is undergoing ablation to address the chronic blood loss of menses, and simplify care while on dialysis, and with her future renal transplant.   Proposed surgery: Hysteroscopy, dilation and curettage endometrial ablation. Procedure by NOVASURE ENDOMETRIAL ABLATION has been discussed using novasure and EPIC educational information as basis of conversation.  Endometrial biopsy done 03/29/15 shows benign inactive endometrium.  Past Medical History  Diagnosis Date  . Hypertension   . HIV infection   . Chronic kidney disease   . Anemia   . Dialysis patient     mon, wed 44  . ESRD (end stage renal disease)   . Stroke   . Complication of anesthesia   . PONV (postoperative nausea and vomiting)   . TIA (transient ischemic attack)     Hx:of   Past Surgical History  Procedure Laterality Date  . Cesarean section    . Arteriovenous graft placement  09/11/11    left arm  . Revison of arteriovenous fistula Left [id number]    Procedure: Plication left arm fistula;  Surgeon: Elam Dutch, MD;  Location: [place];  Service: Vascular;  Laterality: Left;  . Insertion of dialysis catheter Right 09/01/2013    Procedure: INSERTION OF DIALYSIS CATHETER Right Internal Jugular;  Surgeon: Elam Dutch, MD;  Location: [place];  Service: Vascular;  Laterality: Right;  . Patch angioplasty Left 09/01/2013    Procedure: PATCH ANGIOPLASTY;  Surgeon: Elam Dutch, MD;  Location: [facility] OR;  Service: Vascular;  Laterality: Left;   OB History  No data available  Patient denies any other pertinent gynecologic issues.   Current Outpatient Prescriptions on File Prior  to Visit  Medication Sig Dispense Refill  . abacavir (ZIAGEN) 300 MG tablet Take 2 tablets (600 mg total) by mouth daily. 60 tablet 11  . B Complex-C-Folic Acid (RENA-VITE RX) 1 MG TABS Take 1 tablet by mouth daily.    . calcium carbonate (TUMS - DOSED IN MG ELEMENTAL CALCIUM) 500 MG chewable tablet Chew 1-2 tablets by mouth daily as needed for indigestion or heartburn.    . dolutegravir (TIVICAY) 50 MG tablet Take 1 tablet (50 mg total) by mouth daily. 30 tablet 11  . EPIVIR 10 MG/ML solution TAKE 2.5ML BY MOUTH EVERY DAY 240 mL 5  . metoprolol tartrate (LOPRESSOR) 25 MG tablet Take 25 mg by mouth daily. Except takes none on dialysis days (Tuesday, Thursday, and Sunday).    . naproxen sodium (ANAPROX) 220 MG tablet Take 440 mg by mouth daily as needed (pain).     No current facility-administered medications on file prior to visit.   Allergies  Allergen Reactions  . Sulfa Antibiotics Hives  . Fortaz [Ceftazidime Sodium In D5w] Rash    Head-toe  . Vancomycin Rash    Head-toe    Social History:   reports that she quit smoking about 5 years ago. Her smoking use included Cigarettes. She quit after 3 years of use. She has never used smokeless tobacco. She reports that she does not drink alcohol or use illicit drugs.  Family History  Problem Relation Age of Onset  . Hyperlipidemia Mother   . Cancer -  Other Mother     Hx partial hysterectomy  . Heart disease Father     Hx CABG    Review of Systems: Noncontributory  PHYSICAL EXAM: Blood pressure 112/80, pulse 80, height 5\' 1"  (1.549 m), weight 193 lb (87.544 kg), last menstrual period 03/31/2015. General appearance - alert, well appearing, and in no distress Chest - clear to auscultation, no wheezes, rales or rhonchi, symmetric air entry Heart - normal rate and regular rhythm Abdomen - soft, nontender, nondistended, no masses or organomegaly Pelvic - examination not indicated Extremities - peripheral pulses normal, no pedal edema, no  clubbing or cyanosis  Labs: No results found for this or any previous visit (from the past 336 hour(s)).  Imaging Studies: US Transvaginal Non-ob  04-05-2015   GYNECOLOGIC SONOGRAM   Tiffany Golden is a 39 y.o. LMP 03/01/2015 for a pelvic sonogram for pre op  renal tx and enlarged uterus.  Uterus                      10.30x 5.8 x 4.2 cm, heterogeneous uterus w/  an echogenic fibroid rt bdy 2.2 x 2.2 x 1.9cm  Endometrium          7.9 mm, symmetrical , wnl  Right ovary             4.9 x 3.8 x 2.9 cm, complex cyst 3.3 x 2.9 x 2.1  cm  Left ovary                8.4 x 8.7 x 8.4 cm, two cysts (#1) simple cyst  8.7 x 8 x 6.2 cm (#2) complex cyst 2.8 x 2.7 x 2.6 cm     Tech comments:           US PELVIS T/A AND TV: heterogeneous anteverted uterus w/ an  echogenic fibroid rt bdy 2.2 x 1.9 x 2.2cm,EEC 7.97mm,rt ov complex cyst  2.9 x 2.1 x 3.3cm, lt ov contains two cysts (#1) simple cyst 8.7 x 8 x  6.2cm (#2) complex cyst 2.8 x 2.7 x 2.6 cm       [employer] 03/29/2015 3:32 PM  Clinical Impression and recommendations:  I have reviewed the sonogram results above, combined with the patient's  current clinical course, below are my impressions and any appropriate  recommendations for management based on the sonographic findings.  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 LMP 03/01/2015 for a pelvic sonogram for pre op  renal tx and enlarged uterus.  Uterus                      10.30x 5.8 x 4.2 cm, heterogeneous uterus w/  an echogenic fibroid rt bdy 2.2 x 2.2 x 1.9cm  Endometrium          7.9 mm, symmetrical , wnl  Right ovary             4.9 x 3.8 x 2.9 cm, complex cyst 3.3 x 2.9 x 2.1  cm  Left ovary                8.4 x 8.7 x 8.4 cm, two cysts  (#1) simple  cyst  8.7 x 8 x 6.2 cm (#2) complex cyst 2.8 x 2.7 x 2.6 cm     Tech comments:           US PELVIS T/A AND TV: heterogeneous anteverted uterus w/ an  echogenic fibroid rt bdy 2.2 x 1.9 x 2.2cm,EEC 7.63mm,rt ov complex cyst  2.9 x 2.1 x 3.3cm, lt ov contains two cysts (#1) simple cyst 8.7 x 8 x  6.2cm (#2) complex cyst 2.8 x 2.7 x 2.6 cm       [employer] 03/29/2015 3:32 PM  Clinical Impression and recommendations:  I have reviewed the sonogram results above, combined with the patient's  current clinical course, below are my impressions and any appropriate  recommendations for management based on the sonographic findings.  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  EXAM: DIGITAL SCREENING BILATERAL MAMMOGRAM WITH CAD  COMPARISON:  None.  ACR Breast Density Category b: There are scattered areas of fibroglandular density.  FINDINGS: There are no findings suspicious for malignancy. Images were processed with CAD.  IMPRESSION: No mammographic evidence of malignancy. A result letter of this screening mammogram will be mailed directly to the patient.  RECOMMENDATION: Screening mammogram in one year. (Code:SM-B-01Y)  BI-RADS CATEGORY  1: Negative.   Electronically Signed   By: Nolon Nations M.D.   On: 04/18/2015 10:22    Assessment: Patient Active Problem List   Diagnosis Date Noted  . Lumbar spondylosis with myelopathy 02/02/2015  . End stage renal disease 07/30/2013  . Enteritis due to Clostridium difficile 04/07/2013  . TIA (transient ischemic attack) 04/05/2013  . Fever 04/05/2013  . ESRD on hemodialysis 04/05/2013  . AIDS 04/05/2013  . HIV (human immunodeficiency virus infection) 09/06/2011  . ESRD (end stage renal disease)  09/06/2011  . Hypertension 09/06/2011    Plan: Patient will undergo surgical management with hysteroscopy,dilation and. Novasure endometrial ablation. Procedure will be on a Tuesday, with patient having dialysis on the Monday before the proposed surgical procedure.    .04/19/2015 3:16 PM

## 2015-04-20 DIAGNOSIS — N2581 Secondary hyperparathyroidism of renal origin: Secondary | ICD-10-CM | POA: Diagnosis not present

## 2015-04-20 DIAGNOSIS — N186 End stage renal disease: Secondary | ICD-10-CM | POA: Diagnosis not present

## 2015-04-20 DIAGNOSIS — D509 Iron deficiency anemia, unspecified: Secondary | ICD-10-CM | POA: Diagnosis not present

## 2015-04-22 DIAGNOSIS — N2581 Secondary hyperparathyroidism of renal origin: Secondary | ICD-10-CM | POA: Diagnosis not present

## 2015-04-22 DIAGNOSIS — N186 End stage renal disease: Secondary | ICD-10-CM | POA: Diagnosis not present

## 2015-04-22 DIAGNOSIS — D509 Iron deficiency anemia, unspecified: Secondary | ICD-10-CM | POA: Diagnosis not present

## 2015-04-25 DIAGNOSIS — N2581 Secondary hyperparathyroidism of renal origin: Secondary | ICD-10-CM | POA: Diagnosis not present

## 2015-04-25 DIAGNOSIS — D509 Iron deficiency anemia, unspecified: Secondary | ICD-10-CM | POA: Diagnosis not present

## 2015-04-25 DIAGNOSIS — N186 End stage renal disease: Secondary | ICD-10-CM | POA: Diagnosis not present

## 2015-04-26 DIAGNOSIS — N920 Excessive and frequent menstruation with regular cycle: Secondary | ICD-10-CM | POA: Insufficient documentation

## 2015-04-27 DIAGNOSIS — N186 End stage renal disease: Secondary | ICD-10-CM | POA: Diagnosis not present

## 2015-04-27 DIAGNOSIS — D509 Iron deficiency anemia, unspecified: Secondary | ICD-10-CM | POA: Diagnosis not present

## 2015-04-27 DIAGNOSIS — N2581 Secondary hyperparathyroidism of renal origin: Secondary | ICD-10-CM | POA: Diagnosis not present

## 2015-04-27 NOTE — Patient Instructions (Signed)
Tiffany Golden  04/27/2015        Your procedure is scheduled on 05/03/2015  Report to [facility] at 8:45 A.M.  Call this number if you have problems the morning of surgery:  [phone]   Remember:  Do not eat food or drink liquids after midnight.  Take these medicines the morning of surgery with A SIP OF WATER  Zigan, Tivicay, Epivin, Metoprolol   Do not wear jewelry, make-up or nail polish.  Do not wear lotions, powders, or perfumes.  You may wear deodorant.  Do not shave 48 hours prior to surgery.  Men may shave face and neck.  Do not bring valuables to the hospital.  [facility] is not responsible for any belongings or valuables.  Contacts, dentures or bridgework may not be worn into surgery.  Leave your suitcase in the car.  After surgery it may be brought to your room.  For patients admitted to the hospital, discharge time will be determined by your treatment team.  Patients discharged the day of surgery will not be allowed to drive home.    Please read over the following fact sheets that you were given. Surgical Site Infection Prevention and Anesthesia Post-op Instructions      PATIENT INSTRUCTIONS POST-ANESTHESIA  IMMEDIATELY FOLLOWING SURGERY:  Do not drive or operate machinery for the first twenty four hours after surgery.  Do not make any important decisions for twenty four hours after surgery or while taking narcotic pain medications or sedatives.  If you develop intractable nausea and vomiting or a severe headache please notify your doctor immediately.  FOLLOW-UP:  Please make an appointment with your surgeon as instructed. You do not need to follow up with anesthesia unless specifically instructed to do so.  WOUND CARE INSTRUCTIONS (if applicable):  Keep a dry clean dressing on the anesthesia/puncture wound site if there is drainage.  Once the wound has quit draining you may leave it open to air.  Generally you should leave the bandage intact for twenty four  hours unless there is drainage.  If the epidural site drains for more than 36-48 hours please call the anesthesia department.  QUESTIONS?:  Please feel free to call your physician or the hospital operator if you have any questions, and they will be happy to assist you.      Hysteroscopy Hysteroscopy is a procedure used for looking inside the womb (uterus). It may be done for various reasons, including:  To evaluate abnormal bleeding, fibroid (benign, noncancerous) tumors, polyps, scar tissue (adhesions), and possibly cancer of the uterus.  To look for lumps (tumors) and other uterine growths.  To look for causes of why a woman cannot get pregnant (infertility), causes of recurrent loss of pregnancy (miscarriages), or a lost intrauterine device (IUD).  To perform a sterilization by blocking the fallopian tubes from inside the uterus. In this procedure, a thin, flexible tube with a tiny light and camera on the end of it (hysteroscope) is used to look inside the uterus. A hysteroscopy should be done right after a menstrual period to be sure you are not pregnant. LET [facility] CARE PROVIDER KNOW ABOUT:   Any allergies you have.  All medicines you are taking, including vitamins, herbs, eye drops, creams, and over-the-counter medicines.  Previous problems you or members of your family have had with the use of anesthetics.  Any blood disorders you have.  Previous surgeries you have had.  Medical conditions you have. RISKS AND COMPLICATIONS  Generally, this is a safe procedure. However, as with any procedure, complications can occur. Possible complications include:  Putting a hole in the uterus.  Excessive bleeding.  Infection.  Damage to the cervix.  Injury to other organs.  Allergic reaction to medicines.  Too much fluid used in the uterus for the procedure. BEFORE THE PROCEDURE   Ask your health care provider about changing or stopping any regular medicines.  Do not take  aspirin or blood thinners for 1 week before the procedure, or as directed by your health care provider. These can cause bleeding.  If you smoke, do not smoke for 2 weeks before the procedure.  In some cases, a medicine is placed in the cervix the day before the procedure. This medicine makes the cervix have a larger opening (dilate). This makes it easier for the instrument to be inserted into the uterus during the procedure.  Do not eat or drink anything for at least 8 hours before the surgery.  Arrange for someone to take you home after the procedure. PROCEDURE   You may be given a medicine to relax you (sedative). You may also be given one of the following:  A medicine that numbs the area around the cervix (local anesthetic).  A medicine that makes you sleep through the procedure (general anesthetic).  The hysteroscope is inserted through the vagina into the uterus. The camera on the hysteroscope sends a picture to a TV screen. This gives the surgeon a good view inside the uterus.  During the procedure, air or a liquid is put into the uterus, which allows the surgeon to see better.  Sometimes, tissue is gently scraped from inside the uterus. These tissue samples are sent to a lab for testing. AFTER THE PROCEDURE   If you had a general anesthetic, you may be groggy for a couple hours after the procedure.  If you had a local anesthetic, you will be able to go home as soon as you are stable and feel ready.  You may have some cramping. This normally lasts for a couple days.  You may have bleeding, which varies from light spotting for a few days to menstrual-like bleeding for 3-7 days. This is normal.  If your test results are not back during the visit, make an appointment with your health care provider to find out the results. Document Released: 02/04/2001 Document Revised: 08/19/2013 Document Reviewed: 05/28/2013 [facility] Patient Information 2015 [place], [place]. This information is  not intended to replace advice given to you by your health care provider. Make sure you discuss any questions you have with your health care provider. Endometrial Ablation Endometrial ablation removes the lining of the uterus (endometrium). It is usually a same-day, outpatient treatment. Ablation helps avoid major surgery, such as surgery to remove the cervix and uterus (hysterectomy). After endometrial ablation, you will have little or no menstrual bleeding and may not be able to have children. However, if you are premenopausal, you will need to use a reliable method of birth control following the procedure because of the small chance that pregnancy can occur. There are different reasons to have this procedure, which include:  Heavy periods.  Bleeding that is causing anemia.  Irregular bleeding.  Bleeding fibroids on the lining inside the uterus if they are smaller than 3 centimeters. This procedure should not be done if:  You want children in the future.  You have severe cramps with your menstrual period.  You have precancerous or cancerous cells in your uterus.  You were recently pregnant.  You have gone through menopause.  You have had major surgery on the uterus, such as a cesarean delivery. LET [facility] CARE PROVIDER KNOW ABOUT:  Any allergies you have.  All medicines you are taking, including vitamins, herbs, eye drops, creams, and over-the-counter medicines.  Previous problems you or members of your family have had with the use of anesthetics.  Any blood disorders you have.  Previous surgeries you have had.  Medical conditions you have. RISKS AND COMPLICATIONS  Generally, this is a safe procedure. However, as with any procedure, complications can occur. Possible complications include:  Perforation of the uterus.  Bleeding.  Infection of the uterus, bladder, or vagina.  Injury to surrounding organs.  An air bubble to the lung (air embolus).  Pregnancy  following the procedure.  Failure of the procedure to help the problem, requiring hysterectomy.  Decreased ability to diagnose cancer in the lining of the uterus. BEFORE THE PROCEDURE  The lining of the uterus must be tested to make sure there is no pre-cancerous or cancer cells present.  An ultrasound may be performed to look at the size of the uterus and to check for abnormalities.  Medicines may be given to thin the lining of the uterus. PROCEDURE  During the procedure, your health care provider will use a tool called a resectoscope to help see inside your uterus. There are different ways to remove the lining of your uterus.   Radiofrequency - This method uses a radiofrequency-alternating electric current to remove the lining of the uterus.  Cryotherapy - This method uses extreme cold to freeze the lining of the uterus.  Heated-Free Liquid - This method uses heated salt (saline) solution to remove the lining of the uterus.  Microwave - This method uses high-energy microwaves to heat up the lining of the uterus to remove it.  Thermal balloon - This method involves inserting a catheter with a balloon tip into the uterus. The balloon tip is filled with heated fluid to remove the lining of the uterus. AFTER THE PROCEDURE  After your procedure, do not have sexual intercourse or insert anything into your vagina until permitted by your health care provider. After the procedure, you may experience:  Cramps.  Vaginal discharge.  Frequent urination. Document Released: 09/07/2004 Document Revised: 07/01/2013 Document Reviewed: 04/01/2013 [facility] Patient Information 2015 [place], [place]. This information is not intended to replace advice given to you by your health care provider. Make sure you discuss any questions you have with your health care provider. Dilation and Curettage or Vacuum Curettage Dilation and curettage (D&C) and vacuum curettage are minor procedures. A D&C involves  stretching (dilation) the cervix and scraping (curettage) the inside lining of the womb (uterus). During a D&C, tissue is gently scraped from the inside lining of the uterus. During a vacuum curettage, the lining and tissue in the uterus are removed with the use of gentle suction.  Curettage may be performed to either diagnose or treat a problem. As a diagnostic procedure, curettage is performed to examine tissues from the uterus. A diagnostic curettage may be performed for the following symptoms:   Irregular bleeding in the uterus.   Bleeding with the development of clots.   Spotting between menstrual periods.   Prolonged menstrual periods.   Bleeding after menopause.   No menstrual period (amenorrhea).   A change in size and shape of the uterus.  As a treatment procedure, curettage may be performed for the following reasons:  Removal of an IUD (intrauterine device).   Removal of retained placenta after giving birth. Retained placenta can cause an infection or bleeding severe enough to require transfusions.   Abortion.   Miscarriage.   Removal of polyps inside the uterus.   Removal of uncommon types of noncancerous lumps (fibroids).  LET [facility] CARE PROVIDER KNOW ABOUT:   Any allergies you have.   All medicines you are taking, including vitamins, herbs, eye drops, creams, and over-the-counter medicines.   Previous problems you or members of your family have had with the use of anesthetics.   Any blood disorders you have.   Previous surgeries you have had.   Medical conditions you have. RISKS AND COMPLICATIONS  Generally, this is a safe procedure. However, as with any procedure, complications can occur. Possible complications include:  Excessive bleeding.   Infection of the uterus.   Damage to the cervix.   Development of scar tissue (adhesions) inside the uterus, later causing abnormal amounts of menstrual bleeding.   Complications  from the general anesthetic, if a general anesthetic is used.   Putting a hole (perforation) in the uterus. This is rare.  BEFORE THE PROCEDURE   Eat and drink before the procedure only as directed by your health care provider.   Arrange for someone to take you home.  PROCEDURE  This procedure usually takes about 15-30 minutes.  You will be given one of the following:  A medicine that numbs the area in and around the cervix (local anesthetic).   A medicine to make you sleep through the procedure (general anesthetic).  You will lie on your back with your legs in stirrups.   A warm metal or plastic instrument (speculum) will be placed in your vagina to keep it open and to allow the health care provider to see the cervix.  There are two ways in which your cervix can be softened and dilated. These include:   Taking a medicine.   Having thin rods (laminaria) inserted into your cervix.   A curved tool (curette) will be used to scrape cells from the inside lining of the uterus. In some cases, gentle suction is applied with the curette. The curette will then be removed.  AFTER THE PROCEDURE   You will rest in the recovery area until you are stable and are ready to go home.   You may feel sick to your stomach (nauseous) or throw up (vomit) if you were given a general anesthetic.   You may have a sore throat if a tube was placed in your throat during general anesthesia.   You may have light cramping and bleeding. This may last for 2 days to 2 weeks after the procedure.   Your uterus needs to make a new lining after the procedure. This may make your next period late. Document Released: 10/29/2005 Document Revised: 07/01/2013 Document Reviewed: 05/28/2013 [facility] Patient Information 2015 [place], [place]. This information is not intended to replace advice given to you by your health care provider. Make sure you discuss any questions you have with your health care  provider.

## 2015-04-28 ENCOUNTER — Encounter (HOSPITAL_COMMUNITY)
Admission: RE | Admit: 2015-04-28 | Discharge: 2015-04-28 | Disposition: A | Discharge status: Home / Self Care | Payer: Medicare Other | Source: Ambulatory Visit | Attending: Obstetrics and Gynecology | Admitting: Obstetrics and Gynecology

## 2015-04-28 ENCOUNTER — Other Ambulatory Visit: Payer: Self-pay

## 2015-04-28 ENCOUNTER — Other Ambulatory Visit: Payer: Self-pay | Admitting: Obstetrics and Gynecology

## 2015-04-28 ENCOUNTER — Encounter (HOSPITAL_COMMUNITY): Payer: Self-pay

## 2015-04-28 DIAGNOSIS — N920 Excessive and frequent menstruation with regular cycle: Secondary | ICD-10-CM | POA: Insufficient documentation

## 2015-04-28 DIAGNOSIS — Z01818 Encounter for other preprocedural examination: Secondary | ICD-10-CM | POA: Insufficient documentation

## 2015-04-28 DIAGNOSIS — D649 Anemia, unspecified: Secondary | ICD-10-CM | POA: Insufficient documentation

## 2015-04-28 LAB — TYPE AND SCREEN
ABO/RH(D): A NEG
Antibody Screen: NEGATIVE

## 2015-04-28 NOTE — Pre-Procedure Instructions (Addendum)
Patient is on Dialysis M,W,F.  Will need I Stat day of, in addition to serum HCG and PT/INR.  Orders are in for STAT.  T&C drawn on 04/28/2015.

## 2015-04-29 DIAGNOSIS — N2581 Secondary hyperparathyroidism of renal origin: Secondary | ICD-10-CM | POA: Diagnosis not present

## 2015-04-29 DIAGNOSIS — N186 End stage renal disease: Secondary | ICD-10-CM | POA: Diagnosis not present

## 2015-04-29 DIAGNOSIS — D509 Iron deficiency anemia, unspecified: Secondary | ICD-10-CM | POA: Diagnosis not present

## 2015-05-02 ENCOUNTER — Telehealth: Payer: Self-pay | Admitting: Obstetrics and Gynecology

## 2015-05-02 DIAGNOSIS — N2581 Secondary hyperparathyroidism of renal origin: Secondary | ICD-10-CM | POA: Diagnosis not present

## 2015-05-02 DIAGNOSIS — D509 Iron deficiency anemia, unspecified: Secondary | ICD-10-CM | POA: Diagnosis not present

## 2015-05-02 DIAGNOSIS — N186 End stage renal disease: Secondary | ICD-10-CM | POA: Diagnosis not present

## 2015-05-02 NOTE — Telephone Encounter (Signed)
Pt states that she is scheduled for an ablation but she is bleeding very heavy. Pt states that she changes her heavy flow pad every two hours. Pt was informed that she would be able to keep a pad on until she was taken back to surgery per Dr. Glo Herring.

## 2015-05-03 ENCOUNTER — Ambulatory Visit (HOSPITAL_COMMUNITY): Payer: Medicare Other | Admitting: Anesthesiology

## 2015-05-03 ENCOUNTER — Encounter (HOSPITAL_COMMUNITY): Payer: Self-pay | Admitting: *Deleted

## 2015-05-03 ENCOUNTER — Ambulatory Visit (HOSPITAL_COMMUNITY)
Admission: RE | Admit: 2015-05-03 | Discharge: 2015-05-03 | Disposition: A | Discharge status: Home / Self Care | Payer: Medicare Other | Source: Ambulatory Visit | Attending: Obstetrics and Gynecology | Admitting: Obstetrics and Gynecology

## 2015-05-03 ENCOUNTER — Encounter (HOSPITAL_COMMUNITY)
Admission: RE | Disposition: A | Discharge status: Home / Self Care | Payer: Self-pay | Source: Ambulatory Visit | Attending: Obstetrics and Gynecology

## 2015-05-03 DIAGNOSIS — D500 Iron deficiency anemia secondary to blood loss (chronic): Secondary | ICD-10-CM | POA: Diagnosis not present

## 2015-05-03 DIAGNOSIS — Z791 Long term (current) use of non-steroidal anti-inflammatories (NSAID): Secondary | ICD-10-CM | POA: Insufficient documentation

## 2015-05-03 DIAGNOSIS — B20 Human immunodeficiency virus [HIV] disease: Secondary | ICD-10-CM | POA: Insufficient documentation

## 2015-05-03 DIAGNOSIS — Z8673 Personal history of transient ischemic attack (TIA), and cerebral infarction without residual deficits: Secondary | ICD-10-CM | POA: Insufficient documentation

## 2015-05-03 DIAGNOSIS — Z992 Dependence on renal dialysis: Secondary | ICD-10-CM | POA: Diagnosis not present

## 2015-05-03 DIAGNOSIS — M4716 Other spondylosis with myelopathy, lumbar region: Secondary | ICD-10-CM | POA: Insufficient documentation

## 2015-05-03 DIAGNOSIS — Z87891 Personal history of nicotine dependence: Secondary | ICD-10-CM | POA: Insufficient documentation

## 2015-05-03 DIAGNOSIS — N186 End stage renal disease: Secondary | ICD-10-CM | POA: Diagnosis not present

## 2015-05-03 DIAGNOSIS — N920 Excessive and frequent menstruation with regular cycle: Secondary | ICD-10-CM | POA: Diagnosis not present

## 2015-05-03 DIAGNOSIS — D649 Anemia, unspecified: Secondary | ICD-10-CM | POA: Diagnosis not present

## 2015-05-03 DIAGNOSIS — I120 Hypertensive chronic kidney disease with stage 5 chronic kidney disease or end stage renal disease: Secondary | ICD-10-CM | POA: Insufficient documentation

## 2015-05-03 DIAGNOSIS — Z79899 Other long term (current) drug therapy: Secondary | ICD-10-CM | POA: Diagnosis not present

## 2015-05-03 HISTORY — PX: DILITATION & CURRETTAGE/HYSTROSCOPY WITH NOVASURE ABLATION: SHX5568

## 2015-05-03 LAB — CBC WITH DIFFERENTIAL/PLATELET
BASOS ABS: 0.1 10*3/uL (ref 0.0–0.1)
BASOS PCT: 1 % (ref 0–1)
EOS ABS: 0.1 10*3/uL (ref 0.0–0.7)
Eosinophils Relative: 1 % (ref 0–5)
HEMATOCRIT: 43.3 % (ref 36.0–46.0)
Hemoglobin: 14.3 g/dL (ref 12.0–15.0)
Lymphocytes Relative: 22 % (ref 12–46)
Lymphs Abs: 1.9 10*3/uL (ref 0.7–4.0)
MCH: 32.1 pg (ref 26.0–34.0)
MCHC: 33 g/dL (ref 30.0–36.0)
MCV: 97.3 fL (ref 78.0–100.0)
Monocytes Absolute: 0.5 10*3/uL (ref 0.1–1.0)
Monocytes Relative: 6 % (ref 3–12)
Neutro Abs: 6.1 10*3/uL (ref 1.7–7.7)
Neutrophils Relative %: 70 % (ref 43–77)
Platelets: 250 10*3/uL (ref 150–400)
RBC: 4.45 MIL/uL (ref 3.87–5.11)
RDW: 14.4 % (ref 11.5–15.5)
WBC: 8.6 10*3/uL (ref 4.0–10.5)

## 2015-05-03 LAB — BASIC METABOLIC PANEL
Anion gap: 14 (ref 5–15)
BUN: 36 mg/dL — ABNORMAL HIGH (ref 6–20)
CHLORIDE: 95 mmol/L — AB (ref 101–111)
CO2: 28 mmol/L (ref 22–32)
CREATININE: 8.87 mg/dL — AB (ref 0.44–1.00)
Calcium: 8.9 mg/dL (ref 8.9–10.3)
GFR calc non Af Amer: 5 mL/min — ABNORMAL LOW (ref >60)
GFR, EST AFRICAN AMERICAN: 6 mL/min — AB (ref >60)
GLUCOSE: 87 mg/dL (ref 65–99)
Potassium: 5.6 mmol/L — ABNORMAL HIGH (ref 3.5–5.1)
SODIUM: 137 mmol/L (ref 135–145)

## 2015-05-03 LAB — HCG, SERUM, QUALITATIVE: PREG SERUM: NEGATIVE

## 2015-05-03 LAB — PROTIME-INR
INR: 0.98 (ref 0.00–1.49)
PROTHROMBIN TIME: 13.2 s (ref 11.6–15.2)

## 2015-05-03 SURGERY — DILATATION & CURETTAGE/HYSTEROSCOPY WITH NOVASURE ABLATION
Anesthesia: General | Site: Vagina

## 2015-05-03 MED ORDER — CIPROFLOXACIN IN D5W 400 MG/200ML IV SOLN
400.0000 mg | INTRAVENOUS | Status: AC
  Administered 2015-05-03: 500 mg via INTRAVENOUS
  Administered 2015-05-03: 400 mg via INTRAVENOUS

## 2015-05-03 MED ORDER — ONDANSETRON HCL 4 MG/2ML IJ SOLN
4.0000 mg | Freq: Once | INTRAMUSCULAR | Status: AC
  Administered 2015-05-03: 4 mg via INTRAVENOUS

## 2015-05-03 MED ORDER — FENTANYL CITRATE (PF) 100 MCG/2ML IJ SOLN: 25.0000 ug | INTRAMUSCULAR | Status: DC | PRN

## 2015-05-03 MED ORDER — 0.9 % SODIUM CHLORIDE (POUR BTL) OPTIME
TOPICAL | Status: DC | PRN
  Administered 2015-05-03: 1000 mL

## 2015-05-03 MED ORDER — GLYCOPYRROLATE 0.2 MG/ML IJ SOLN
INTRAMUSCULAR | Status: AC
  Filled 2015-05-03: qty 1

## 2015-05-03 MED ORDER — BUPIVACAINE-EPINEPHRINE 0.5% -1:200000 IJ SOLN
INTRAMUSCULAR | Status: DC | PRN
  Administered 2015-05-03: 20 mL

## 2015-05-03 MED ORDER — METRONIDAZOLE IN NACL 5-0.79 MG/ML-% IV SOLN
500.0000 mg | INTRAVENOUS | Status: AC
  Administered 2015-05-03: 500 mg via INTRAVENOUS
  Administered 2015-05-03: 400 mg via INTRAVENOUS

## 2015-05-03 MED ORDER — ONDANSETRON HCL 4 MG/2ML IJ SOLN
INTRAMUSCULAR | Status: AC
  Filled 2015-05-03: qty 2

## 2015-05-03 MED ORDER — PROPOFOL 10 MG/ML IV BOLUS
INTRAVENOUS | Status: DC | PRN
  Administered 2015-05-03: 140 mg via INTRAVENOUS

## 2015-05-03 MED ORDER — DEXAMETHASONE SODIUM PHOSPHATE 4 MG/ML IJ SOLN
4.0000 mg | Freq: Once | INTRAMUSCULAR | Status: AC
  Administered 2015-05-03: 4 mg via INTRAVENOUS

## 2015-05-03 MED ORDER — DEXAMETHASONE SODIUM PHOSPHATE 4 MG/ML IJ SOLN
INTRAMUSCULAR | Status: AC
  Filled 2015-05-03: qty 1

## 2015-05-03 MED ORDER — KETOROLAC TROMETHAMINE 10 MG PO TABS: 10.0000 mg | ORAL_TABLET | Freq: Four times a day (QID) | ORAL | Status: DC | PRN

## 2015-05-03 MED ORDER — CIPROFLOXACIN IN D5W 400 MG/200ML IV SOLN
INTRAVENOUS | Status: AC
  Filled 2015-05-03: qty 200

## 2015-05-03 MED ORDER — SODIUM CHLORIDE 0.9 % IJ SOLN
INTRAMUSCULAR | Status: AC
  Filled 2015-05-03: qty 3

## 2015-05-03 MED ORDER — PROPOFOL 10 MG/ML IV BOLUS
INTRAVENOUS | Status: AC
  Filled 2015-05-03: qty 20

## 2015-05-03 MED ORDER — METRONIDAZOLE IN NACL 5-0.79 MG/ML-% IV SOLN
INTRAVENOUS | Status: AC
  Filled 2015-05-03: qty 100

## 2015-05-03 MED ORDER — FENTANYL CITRATE (PF) 100 MCG/2ML IJ SOLN
INTRAMUSCULAR | Status: DC | PRN
  Administered 2015-05-03 (×2): 25 ug via INTRAVENOUS
  Administered 2015-05-03: 50 ug via INTRAVENOUS

## 2015-05-03 MED ORDER — SODIUM CHLORIDE 0.9 % IR SOLN
Status: DC | PRN
  Administered 2015-05-03: 3000 mL

## 2015-05-03 MED ORDER — FENTANYL CITRATE (PF) 100 MCG/2ML IJ SOLN
INTRAMUSCULAR | Status: AC
  Filled 2015-05-03: qty 2

## 2015-05-03 MED ORDER — FENTANYL CITRATE (PF) 100 MCG/2ML IJ SOLN
25.0000 ug | INTRAMUSCULAR | Status: AC
  Administered 2015-05-03 (×2): 25 ug via INTRAVENOUS

## 2015-05-03 MED ORDER — MIDAZOLAM HCL 2 MG/2ML IJ SOLN
INTRAMUSCULAR | Status: AC
  Filled 2015-05-03: qty 2

## 2015-05-03 MED ORDER — MIDAZOLAM HCL 2 MG/2ML IJ SOLN
1.0000 mg | INTRAMUSCULAR | Status: DC | PRN
  Administered 2015-05-03: 2 mg via INTRAVENOUS

## 2015-05-03 MED ORDER — SODIUM CHLORIDE 0.9 % IV SOLN
INTRAVENOUS | Status: DC
  Administered 2015-05-03: 10:00:00 via INTRAVENOUS

## 2015-05-03 MED ORDER — BUPIVACAINE-EPINEPHRINE (PF) 0.5% -1:200000 IJ SOLN
INTRAMUSCULAR | Status: AC
  Filled 2015-05-03: qty 30

## 2015-05-03 MED ORDER — MIDAZOLAM HCL 5 MG/5ML IJ SOLN
INTRAMUSCULAR | Status: DC | PRN
  Administered 2015-05-03: 2 mg via INTRAVENOUS

## 2015-05-03 MED ORDER — GLYCOPYRROLATE 0.2 MG/ML IJ SOLN
0.2000 mg | Freq: Once | INTRAMUSCULAR | Status: AC
  Administered 2015-05-03: 0.2 mg via INTRAVENOUS

## 2015-05-03 MED ORDER — OXYCODONE-ACETAMINOPHEN 5-325 MG PO TABS: 1.0000 | ORAL_TABLET | ORAL | Status: DC | PRN

## 2015-05-03 MED ORDER — ONDANSETRON HCL 4 MG/2ML IJ SOLN: 4.0000 mg | Freq: Once | INTRAMUSCULAR | Status: DC | PRN

## 2015-05-03 SURGICAL SUPPLY — 30 items
ABLATOR ENDOMETRIAL BIPOLAR (ABLATOR) ×3 IMPLANT
BAG HAMPER (MISCELLANEOUS) ×3 IMPLANT
CATH ROBINSON RED A/P 16FR (CATHETERS) ×3 IMPLANT
CLOTH BEACON ORANGE TIMEOUT ST (SAFETY) ×3 IMPLANT
COVER LIGHT HANDLE STERIS (MISCELLANEOUS) ×6 IMPLANT
DECANTER SPIKE VIAL GLASS SM (MISCELLANEOUS) ×3 IMPLANT
FORMALIN 10 PREFIL 120ML (MISCELLANEOUS) ×3 IMPLANT
GLOVE BIOGEL PI IND STRL 7.0 (GLOVE) IMPLANT
GLOVE BIOGEL PI IND STRL 9 (GLOVE) ×1 IMPLANT
GLOVE BIOGEL PI INDICATOR 7.0 (GLOVE) ×2
GLOVE BIOGEL PI INDICATOR 9 (GLOVE) ×2
GLOVE ECLIPSE 9.0 STRL (GLOVE) ×3 IMPLANT
GLOVE EXAM NITRILE MD LF STRL (GLOVE) ×2 IMPLANT
GLOVE SS BIOGEL STRL SZ 6.5 (GLOVE) IMPLANT
GLOVE SUPERSENSE BIOGEL SZ 6.5 (GLOVE) ×2
GOWN SPEC L3 XXLG W/TWL (GOWN DISPOSABLE) ×3 IMPLANT
GOWN STRL REUS W/TWL LRG LVL3 (GOWN DISPOSABLE) ×3 IMPLANT
INST SET HYSTEROSCOPY (KITS) ×3 IMPLANT
IV NS 1000ML (IV SOLUTION) ×3
IV NS 1000ML BAXH (IV SOLUTION) ×1 IMPLANT
KIT ROOM TURNOVER AP CYSTO (KITS) ×3 IMPLANT
KIT ROOM TURNOVER APOR (KITS) ×3 IMPLANT
MANIFOLD NEPTUNE II (INSTRUMENTS) ×3 IMPLANT
NS IRRIG 1000ML POUR BTL (IV SOLUTION) ×3 IMPLANT
PACK PERI GYN (CUSTOM PROCEDURE TRAY) ×3 IMPLANT
PAD ARMBOARD 7.5X6 YLW CONV (MISCELLANEOUS) ×3 IMPLANT
PAD TELFA 3X4 1S STER (GAUZE/BANDAGES/DRESSINGS) ×3 IMPLANT
SET BASIN LINEN APH (SET/KITS/TRAYS/PACK) ×3 IMPLANT
SET IRRIG Y TYPE TUR BLADDER L (SET/KITS/TRAYS/PACK) ×3 IMPLANT
SYR CONTROL 10ML LL (SYRINGE) ×3 IMPLANT

## 2015-05-03 NOTE — Anesthesia Postprocedure Evaluation (Signed)
  Anesthesia Post-op Note  Patient: Tiffany Golden  Procedure(s) Performed: Procedure(s): DILATATION/HYSTEROSCOPY WITH NOVASURE ABLATION; uterine cavity length 5.0 cm, uterine cavity width 3.8 cm, power 105 watts; time 1 minute 12 seconds (N/A)  Patient Location: PACU  Anesthesia Type:General  Level of Consciousness: awake, alert , oriented and patient cooperative  Airway and Oxygen Therapy: Patient Spontanous Breathing  Post-op Pain: 2 /10, mild  Post-op Assessment: Post-op Vital signs reviewed, Patient's Cardiovascular Status Stable, Respiratory Function Stable, Patent Airway, No signs of Nausea or vomiting and Adequate PO intake              Post-op Vital Signs: Reviewed and stable  Last Vitals:  Filed Vitals:   05/03/15 1015  BP: 125/82  Pulse:   Temp:   Resp: 14    Complications: No apparent anesthesia complications

## 2015-05-03 NOTE — Anesthesia Preprocedure Evaluation (Signed)
Anesthesia Evaluation  Patient identified by MRN, date of birth, ID band Patient awake    Reviewed: Allergy & Precautions, H&P , NPO status , Patient's Chart, lab work & pertinent test results, reviewed documented beta blocker date and time   History of Anesthesia Complications (+) PONV and history of anesthetic complications  Airway Mallampati: I  TM Distance: >3 FB Neck ROM: Full    Dental  (+) Teeth Intact, Dental Advisory Given   Pulmonary former smoker,  breath sounds clear to auscultation        Cardiovascular hypertension, Pt. on medications and Pt. on home beta blockers Rhythm:Regular Rate:Normal     Neuro/Psych TIACVA    GI/Hepatic negative GI ROS,   Endo/Other    Renal/GU ESRFRenal disease     Musculoskeletal   Abdominal   Peds  Hematology  (+) anemia , HIV,   Anesthesia Other Findings   Reproductive/Obstetrics                             Anesthesia Physical Anesthesia Plan  ASA: III  Anesthesia Plan: General   Post-op Pain Management:    Induction: Intravenous  Airway Management Planned: LMA  Additional Equipment:   Intra-op Plan:   Post-operative Plan: Extubation in OR  Informed Consent: I have reviewed the patients History and Physical, chart, labs and discussed the procedure including the risks, benefits and alternatives for the proposed anesthesia with the patient or authorized representative who has indicated his/her understanding and acceptance.     Plan Discussed with:   Anesthesia Plan Comments:         Anesthesia Quick Evaluation

## 2015-05-03 NOTE — Anesthesia Procedure Notes (Signed)
Procedure Name: LMA Insertion Date/Time: 05/03/2015 10:46 AM Performed by: Charmaine Downs Pre-anesthesia Checklist: Patient identified, Emergency Drugs available, Suction available, Patient being monitored and Timeout performed Patient Re-evaluated:Patient Re-evaluated prior to inductionOxygen Delivery Method: Circle system utilized Preoxygenation: Pre-oxygenation with 100% oxygen Intubation Type: IV induction Ventilation: Mask ventilation without difficulty LMA: LMA inserted LMA Size: 4.0 Grade View: Grade I Tube type: Oral Number of attempts: 1 Placement Confirmation: positive ETCO2 and breath sounds checked- equal and bilateral Tube secured with: Tape Dental Injury: Teeth and Oropharynx as per pre-operative assessment

## 2015-05-03 NOTE — Op Note (Signed)
05/03/2015  11:22 AM  PATIENT:  Tiffany Golden  39 y.o. female  PRE-OPERATIVE DIAGNOSIS:  MENORRHAGIA, ANEMIA  POST-OPERATIVE DIAGNOSIS:  MENORRHAGIA, ANEMIA  PROCEDURE:  Procedure(s): DILATATION/HYSTEROSCOPY WITH NOVASURE ABLATION; uterine cavity length 5.0 cm, uterine cavity width 3.8 cm, power 105 watts; time 1 minute 12 seconds (N/A)  SURGEON:  Surgeon(s) and Role:    * Jonnie Kind, MD - Primary  PHYSICIAN ASSISTANT:   ASSISTANTS: none   ANESTHESIA:   local and general  EBL:  Total I/O In: 200 [I.V.:200] Out: -   BLOOD ADMINISTERED:none  DRAINS: none   LOCAL MEDICATIONS USED:  Marcaine 20 cc  SPECIMEN:  No Specimen  DISPOSITION OF SPECIMEN:  N/A  COUNTS:  YES  TOURNIQUET:  * No tourniquets in log *  DICTATION: .Dragon Dictation  PLAN OF CARE: Discharge to home after PACU  PATIENT DISPOSITION:  PACU - hemodynamically stable.   Delay start of Pharmacological VTE agent (>24hrs) due to surgical blood loss or risk of bleeding: not applicable  Details of procedure. Patient was taken operating room prepped and draped for vaginal procedure with timeout conducted. Cervix was grasped with single-tooth tenaculum and the uterus sounded to a 0.5 cm,, with uterus and cervix sounded to 8.5 cm, dilated to 25 French mouth and the NovaSure sounding device measured the uterine cavity at 5.0 cm. Hysteroscopy was performed which showed an intact uterine cavity, normal tubal ostia bilaterally, and no tissue of significance Uterine cavity width was confirmed at 3.8 cm upon positioning of the NovaSure ablation device, then the power was activated,105 W for 1 minute 12 seconds procedure. Device was removed without difficulty, patient then allowed to go recovery room in good condition sponge and needle counts correct

## 2015-05-03 NOTE — Transfer of Care (Signed)
Immediate Anesthesia Transfer of Care Note  Patient: Tiffany Golden  Procedure(s) Performed: Procedure(s): DILATATION/HYSTEROSCOPY WITH NOVASURE ABLATION; uterine cavity length 5.0 cm, uterine cavity width 3.8 cm, power 105 watts; time 1 minute 12 seconds (N/A)  Patient Location: PACU  Anesthesia Type:General  Level of Consciousness: awake, alert , oriented and patient cooperative  Airway & Oxygen Therapy: Patient Spontanous Breathing and Patient connected to face mask oxygen  Post-op Assessment: Report given to RN, Post -op Vital signs reviewed and stable and Patient moving all extremities  Post vital signs: Reviewed and stable  Last Vitals:  Filed Vitals:   05/03/15 1015  BP: 125/82  Pulse:   Temp:   Resp: 14    Complications: No apparent anesthesia complications

## 2015-05-03 NOTE — Discharge Instructions (Signed)
Endometrial Ablation Endometrial ablation removes the lining of the uterus (endometrium). It is usually a same-day, outpatient treatment. Ablation helps avoid major surgery, such as surgery to remove the cervix and uterus (hysterectomy). After endometrial ablation, you will have little or no menstrual bleeding and may not be able to have children. However, if you are premenopausal, you will need to use a reliable method of birth control following the procedure because of the small chance that pregnancy can occur. There are different reasons to have this procedure, which include:  Heavy periods.  Bleeding that is causing anemia.  Irregular bleeding.  Bleeding fibroids on the lining inside the uterus if they are smaller than 3 centimeters. This procedure should not be done if:  You want children in the future.  You have severe cramps with your menstrual period.  You have precancerous or cancerous cells in your uterus.  You were recently pregnant.  You have gone through menopause.  You have had major surgery on the uterus, such as a cesarean delivery. LET YOUR HEALTH CARE PROVIDER KNOW ABOUT:  Any allergies you have.  All medicines you are taking, including vitamins, herbs, eye drops, creams, and over-the-counter medicines.  Previous problems you or members of your family have had with the use of anesthetics.  Any blood disorders you have.  Previous surgeries you have had.  Medical conditions you have. RISKS AND COMPLICATIONS  Generally, this is a safe procedure. However, as with any procedure, complications can occur. Possible complications include:  Perforation of the uterus.  Bleeding.  Infection of the uterus, bladder, or vagina.  Injury to surrounding organs.  An air bubble to the lung (air embolus).  Pregnancy following the procedure.  Failure of the procedure to help the problem, requiring hysterectomy.  Decreased ability to diagnose cancer in the lining of  the uterus. BEFORE THE PROCEDURE  The lining of the uterus must be tested to make sure there is no pre-cancerous or cancer cells present.  An ultrasound may be performed to look at the size of the uterus and to check for abnormalities.  Medicines may be given to thin the lining of the uterus. PROCEDURE  During the procedure, your health care provider will use a tool called a resectoscope to help see inside your uterus. There are different ways to remove the lining of your uterus.   Radiofrequency - This method uses a radiofrequency-alternating electric current to remove the lining of the uterus.  Cryotherapy - This method uses extreme cold to freeze the lining of the uterus.  Heated-Free Liquid - This method uses heated salt (saline) solution to remove the lining of the uterus.  Microwave - This method uses high-energy microwaves to heat up the lining of the uterus to remove it.  Thermal balloon - This method involves inserting a catheter with a balloon tip into the uterus. The balloon tip is filled with heated fluid to remove the lining of the uterus. AFTER THE PROCEDURE  After your procedure, do not have sexual intercourse or insert anything into your vagina until permitted by your health care provider. After the procedure, you may experience:  Cramps.  Vaginal discharge.  Frequent urination. Document Released: 09/07/2004 Document Revised: 07/01/2013 Document Reviewed: 04/01/2013 [employer] Patient Information 2015 [employer]. This information is not intended to replace advice given to you by your health care provider. Make sure you discuss any questions you have with your health care provider.  

## 2015-05-03 NOTE — Interval H&P Note (Signed)
History and Physical Interval Note:  05/03/2015 9:53 AM  Tiffany Golden  has presented today for surgery, with the diagnosis of MENORRHAGIA  The various methods of treatment have been discussed with the patient and family. After consideration of risks, benefits and other options for treatment, the patient has consented to  Procedure(s): Lipan (N/A) as a surgical intervention .  The patient's history has been reviewed, patient examined, no change in status, stable for surgery.  I have reviewed the patient's chart and labs. Potassium is 5.6. Creatinine 8. Discussed with Anesthesia.  Questions were answered to the patient's satisfaction.     Jonnie Kind

## 2015-05-03 NOTE — Brief Op Note (Signed)
05/03/2015  11:22 AM  PATIENT:  Tiffany Golden  39 y.o. female  PRE-OPERATIVE DIAGNOSIS:  MENORRHAGIA, ANEMIA  POST-OPERATIVE DIAGNOSIS:  MENORRHAGIA, ANEMIA  PROCEDURE:  Procedure(s): DILATATION/HYSTEROSCOPY WITH NOVASURE ABLATION; uterine cavity length 5.0 cm, uterine cavity width 3.8 cm, power 105 watts; time 1 minute 12 seconds (N/A)  SURGEON:  Surgeon(s) and Role:    * Jonnie Kind, MD - Primary  PHYSICIAN ASSISTANT:   ASSISTANTS: none   ANESTHESIA:   local and general  EBL:  Total I/O In: 200 [I.V.:200] Out: -   BLOOD ADMINISTERED:none  DRAINS: none   LOCAL MEDICATIONS USED:  Marcaine 20 cc  SPECIMEN:  No Specimen  DISPOSITION OF SPECIMEN:  N/A  COUNTS:  YES  TOURNIQUET:  * No tourniquets in log *  DICTATION: .Dragon Dictation  PLAN OF CARE: Discharge to home after PACU  PATIENT DISPOSITION:  PACU - hemodynamically stable.   Delay start of Pharmacological VTE agent (>24hrs) due to surgical blood loss or risk of bleeding: not applicable

## 2015-05-03 NOTE — H&P (Signed)
Tiffany Kind, MD at 04/19/2015 3:16 PM     Status: Signed       Expand All Collapse All   Patient ID: Tiffany Golden, female DOB: Mar 12, 1976, 39 y.o. MRN: [id number] Preoperative History and Physical  Tiffany Golden is a 39 y.o. No obstetric history on file. here for surgical management of heavy menses. She has ESRD, and has chronic anemia, will be undergoing renal transplant when available. No significant preoperative concerns. Pt is undergoing ablation to address the chronic blood loss of menses, and simplify care while on dialysis, and with her future renal transplant.   Proposed surgery: Hysteroscopy, dilation and curettage endometrial ablation. Procedure by NOVASURE ENDOMETRIAL ABLATION has been discussed using novasure and EPIC educational information as basis of conversation.  Endometrial biopsy done 03/29/15 shows benign inactive endometrium.  Past Medical History  Diagnosis Date  . Hypertension   . HIV infection   . Chronic kidney disease   . Anemia   . Dialysis patient     mon, wed 68  . ESRD (end stage renal disease)   . Stroke   . Complication of anesthesia   . PONV (postoperative nausea and vomiting)   . TIA (transient ischemic attack)     Hx:of   Past Surgical History  Procedure Laterality Date  . Cesarean section    . Arteriovenous graft placement  09/11/11    left arm  . Revison of arteriovenous fistula Left [id number]    Procedure: Plication left arm fistula; Surgeon: Elam Dutch, MD; Location: [facility]; Service: Vascular; Laterality: Left;  . Insertion of dialysis catheter Right 09/01/2013    Procedure: INSERTION OF DIALYSIS CATHETER Right Internal Jugular; Surgeon: Elam Dutch, MD; Location: [facility]; Service: Vascular; Laterality: Right;  . Patch angioplasty Left 09/01/2013    Procedure: PATCH ANGIOPLASTY; Surgeon: Elam Dutch, MD; Location: [facility] OR;  Service: Vascular; Laterality: Left;   OB History  No data available  Patient denies any other pertinent gynecologic issues.   Current Outpatient Prescriptions on File Prior to Visit  Medication Sig Dispense Refill  . abacavir (ZIAGEN) 300 MG tablet Take 2 tablets (600 mg total) by mouth daily. 60 tablet 11  . B Complex-C-Folic Acid (RENA-VITE RX) 1 MG TABS Take 1 tablet by mouth daily.    . calcium carbonate (TUMS - DOSED IN MG ELEMENTAL CALCIUM) 500 MG chewable tablet Chew 1-2 tablets by mouth daily as needed for indigestion or heartburn.    . dolutegravir (TIVICAY) 50 MG tablet Take 1 tablet (50 mg total) by mouth daily. 30 tablet 11  . EPIVIR 10 MG/ML solution TAKE 2.5ML BY MOUTH EVERY DAY 240 mL 5  . metoprolol tartrate (LOPRESSOR) 25 MG tablet Take 25 mg by mouth daily. Except takes none on dialysis days (Tuesday, Thursday, and Sunday).    . naproxen sodium (ANAPROX) 220 MG tablet Take 440 mg by mouth daily as needed (pain).     No current facility-administered medications on file prior to visit.   Allergies  Allergen Reactions  . Sulfa Antibiotics Hives  . Fortaz [Ceftazidime Sodium In D5w] Rash    Head-toe  . Vancomycin Rash    Head-toe    Social History:  reports that she quit smoking about 5 years ago. Her smoking use included Cigarettes. She quit after 3 years of use. She has never used smokeless tobacco. She reports that she does not drink alcohol or use illicit drugs.  Family History  Problem Relation Age of Onset  .  Hyperlipidemia Mother   . Cancer - Other Mother     Hx partial hysterectomy  . Heart disease Father     Hx CABG    Review of Systems: Noncontributory  PHYSICAL EXAM: Blood pressure 112/80, pulse 80, height 5\' 1"  (1.549 m), weight 193 lb (87.544 kg), last menstrual period 03/31/2015. General appearance - alert, well appearing, and in no distress Chest -  clear to auscultation, no wheezes, rales or rhonchi, symmetric air entry Heart - normal rate and regular rhythm Abdomen - soft, nontender, nondistended, no masses or organomegaly Pelvic - examination not indicated Extremities - peripheral pulses normal, no pedal edema, no clubbing or cyanosis  Labs: No results found for this or any previous visit (from the past 336 hour(s)).  Imaging Studies:  Imaging Results    US Transvaginal Non-ob  03/30/2015 GYNECOLOGIC SONOGRAM Tiffany Golden is a 39 y.o. LMP 03/01/2015 for a pelvic sonogram for pre op renal tx and enlarged uterus. Uterus 10.30x 5.8 x 4.2 cm, heterogeneous uterus w/ an echogenic fibroid rt bdy 2.2 x 2.2 x 1.9cm Endometrium 7.9 mm, symmetrical , wnl Right ovary 4.9 x 3.8 x 2.9 cm, complex cyst 3.3 x 2.9 x 2.1 cm Left ovary 8.4 x 8.7 x 8.4 cm, two cysts (#1) simple cyst 8.7 x 8 x 6.2 cm (#2) complex cyst 2.8 x 2.7 x 2.6 cm Tech comments: US PELVIS T/A AND TV: heterogeneous anteverted uterus w/ an echogenic fibroid rt bdy 2.2 x 1.9 x 2.2cm,EEC 7.77mm,rt ov complex cyst 2.9 x 2.1 x 3.3cm, lt ov contains two cysts (#1) simple cyst 8.7 x 8 x 6.2cm (#2) complex cyst 2.8 x 2.7 x 2.6 cm [employer] 03/29/2015 3:32 PM Clinical Impression and recommendations: I have reviewed the sonogram results above, combined with the patient's current clinical course, below are my impressions and any appropriate recommendations for management based on the sonographic findings. 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 LMP 03/01/2015 for a pelvic sonogram for pre op renal tx and enlarged uterus.  Uterus 10.30x 5.8 x 4.2 cm, heterogeneous uterus w/ an echogenic fibroid rt bdy 2.2 x 2.2 x 1.9cm Endometrium 7.9 mm, symmetrical , wnl Right ovary 4.9 x 3.8 x 2.9 cm, complex cyst 3.3 x 2.9 x 2.1 cm Left ovary 8.4 x 8.7 x 8.4 cm, two cysts (#1) simple cyst 8.7 x 8 x 6.2 cm (#2) complex cyst 2.8 x 2.7 x 2.6 cm Tech comments: US PELVIS T/A AND TV: heterogeneous anteverted uterus w/ an echogenic fibroid rt bdy 2.2 x 1.9 x 2.2cm,EEC 7.37mm,rt ov complex cyst 2.9 x 2.1 x 3.3cm, lt ov contains two cysts (#1) simple cyst 8.7 x 8 x 6.2cm (#2) complex cyst 2.8 x 2.7 x 2.6 cm [employer] 03/29/2015 3:32 PM Clinical Impression and recommendations: I have reviewed the sonogram results above, combined with the patient's current clinical course, below are my impressions and any appropriate recommendations for management based on the sonographic findings. 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 EXAM: DIGITAL  SCREENING BILATERAL MAMMOGRAM WITH CAD COMPARISON: None. ACR Breast Density Category b: There are scattered areas of fibroglandular density. FINDINGS: There are no findings suspicious for malignancy. Images were processed with CAD. IMPRESSION: No mammographic evidence of malignancy. A result letter of this screening mammogram will be mailed directly to the patient. RECOMMENDATION: Screening mammogram in one year. (Code:SM-B-01Y) BI-RADS CATEGORY 1: Negative. Electronically Signed By: Nolon Nations M.D. On: 04/18/2015 10:22     Assessment: Patient Active Problem List   Diagnosis Date Noted  . Lumbar spondylosis with myelopathy 02/02/2015   . End stage renal disease 07/30/2013  . Enteritis due to Clostridium difficile 04/07/2013  . TIA (transient ischemic attack) 04/05/2013  . Fever 04/05/2013  . ESRD on hemodialysis 04/05/2013  . AIDS 04/05/2013  . HIV (human immunodeficiency virus infection) 09/06/2011  . ESRD (end stage renal disease) 09/06/2011  . Hypertension 09/06/2011    Plan: Patient will undergo surgical management with hysteroscopy,dilation and. Novasure endometrial ablation. Procedure will be on a Tuesday, with patient having dialysis on the Monday before the proposed surgical procedure.    .04/19/2015 3:16 PM

## 2015-05-04 DIAGNOSIS — N2581 Secondary hyperparathyroidism of renal origin: Secondary | ICD-10-CM | POA: Diagnosis not present

## 2015-05-04 DIAGNOSIS — N186 End stage renal disease: Secondary | ICD-10-CM | POA: Diagnosis not present

## 2015-05-04 DIAGNOSIS — D509 Iron deficiency anemia, unspecified: Secondary | ICD-10-CM | POA: Diagnosis not present

## 2015-05-05 ENCOUNTER — Encounter (HOSPITAL_COMMUNITY): Payer: Self-pay | Admitting: Obstetrics and Gynecology

## 2015-05-06 DIAGNOSIS — M545 Low back pain: Secondary | ICD-10-CM | POA: Diagnosis not present

## 2015-05-06 DIAGNOSIS — Z538 Procedure and treatment not carried out for other reasons: Secondary | ICD-10-CM | POA: Diagnosis not present

## 2015-05-06 DIAGNOSIS — N186 End stage renal disease: Secondary | ICD-10-CM | POA: Diagnosis not present

## 2015-05-06 DIAGNOSIS — D509 Iron deficiency anemia, unspecified: Secondary | ICD-10-CM | POA: Diagnosis not present

## 2015-05-06 DIAGNOSIS — N2581 Secondary hyperparathyroidism of renal origin: Secondary | ICD-10-CM | POA: Diagnosis not present

## 2015-05-09 DIAGNOSIS — N186 End stage renal disease: Secondary | ICD-10-CM | POA: Diagnosis not present

## 2015-05-09 DIAGNOSIS — N2581 Secondary hyperparathyroidism of renal origin: Secondary | ICD-10-CM | POA: Diagnosis not present

## 2015-05-09 DIAGNOSIS — D509 Iron deficiency anemia, unspecified: Secondary | ICD-10-CM | POA: Diagnosis not present

## 2015-05-11 ENCOUNTER — Encounter: Payer: Medicare Other | Admitting: Obstetrics and Gynecology

## 2015-05-11 DIAGNOSIS — N2581 Secondary hyperparathyroidism of renal origin: Secondary | ICD-10-CM | POA: Diagnosis not present

## 2015-05-11 DIAGNOSIS — D509 Iron deficiency anemia, unspecified: Secondary | ICD-10-CM | POA: Diagnosis not present

## 2015-05-11 DIAGNOSIS — N186 End stage renal disease: Secondary | ICD-10-CM | POA: Diagnosis not present

## 2015-05-12 ENCOUNTER — Encounter: Payer: Self-pay | Admitting: Obstetrics and Gynecology

## 2015-05-12 ENCOUNTER — Ambulatory Visit (INDEPENDENT_AMBULATORY_CARE_PROVIDER_SITE_OTHER): Payer: Medicare Other | Admitting: Obstetrics and Gynecology

## 2015-05-12 VITALS — BP 90/64 | HR 100 | Ht 61.0 in | Wt 192.2 lb

## 2015-05-12 DIAGNOSIS — I120 Hypertensive chronic kidney disease with stage 5 chronic kidney disease or end stage renal disease: Secondary | ICD-10-CM | POA: Diagnosis not present

## 2015-05-12 DIAGNOSIS — Z9889 Other specified postprocedural states: Secondary | ICD-10-CM

## 2015-05-12 DIAGNOSIS — Z992 Dependence on renal dialysis: Secondary | ICD-10-CM | POA: Diagnosis not present

## 2015-05-12 DIAGNOSIS — N186 End stage renal disease: Secondary | ICD-10-CM | POA: Diagnosis not present

## 2015-05-12 NOTE — Progress Notes (Signed)
Patient ID: Tiffany Golden, female   DOB: [birth year], 39 y.o.   MRN: [id number]  Subjective:  Tiffany Golden is a 39 y.o. female now 9 days status post D&C/Hysteroscopy with Novasure endometrial ablation.  Patient has no complaints since the surgery. She states she is still having watery discharge.   Review of Systems Negative except as noted above r diet:   normal   Bowel movements : normal.  The patient is not having any pain.   Objective:  BP 90/64 mmHg  Pulse 100  Ht 5\' 1"  (1.549 m)  Wt 192 lb 3.2 oz (87.181 kg)  BMI 36.33 kg/m2  LMP 05/01/2015 General:Well developed, well nourished.  No acute distress. Abdomen: Bowel sounds normal, soft, non-tender.  Assessment:  Post-Op 9 days s/p D&C/Hysteroscopy with Novasure endometrial ablation.     Doing well postoperatively.   Plan:  1.Wound care n/a 2. . current medications. n/a 3. Activity restrictions: no sexual activity until bleeding stops 4. return to work: not applicable. 5. Follow up prn.  This chart was SCRIBED for Mallory Shirk, MD by Stephania Fragmin, ED Scribe. This patient was seen in room 1, and the patient's care was started at 4:37 PM.  I personally performed the services described in this documentation, which was SCRIBED in my presence. The recorded information has been reviewed and considered accurate. It has been edited as necessary during review. Jonnie Kind, MD

## 2015-05-13 DIAGNOSIS — N186 End stage renal disease: Secondary | ICD-10-CM | POA: Diagnosis not present

## 2015-05-13 DIAGNOSIS — D509 Iron deficiency anemia, unspecified: Secondary | ICD-10-CM | POA: Diagnosis not present

## 2015-05-13 DIAGNOSIS — N2581 Secondary hyperparathyroidism of renal origin: Secondary | ICD-10-CM | POA: Diagnosis not present

## 2015-05-16 DIAGNOSIS — D509 Iron deficiency anemia, unspecified: Secondary | ICD-10-CM | POA: Diagnosis not present

## 2015-05-16 DIAGNOSIS — N186 End stage renal disease: Secondary | ICD-10-CM | POA: Diagnosis not present

## 2015-05-16 DIAGNOSIS — N2581 Secondary hyperparathyroidism of renal origin: Secondary | ICD-10-CM | POA: Diagnosis not present

## 2015-05-18 DIAGNOSIS — D509 Iron deficiency anemia, unspecified: Secondary | ICD-10-CM | POA: Diagnosis not present

## 2015-05-18 DIAGNOSIS — N2581 Secondary hyperparathyroidism of renal origin: Secondary | ICD-10-CM | POA: Diagnosis not present

## 2015-05-18 DIAGNOSIS — N186 End stage renal disease: Secondary | ICD-10-CM | POA: Diagnosis not present

## 2015-05-20 DIAGNOSIS — N2581 Secondary hyperparathyroidism of renal origin: Secondary | ICD-10-CM | POA: Diagnosis not present

## 2015-05-20 DIAGNOSIS — N186 End stage renal disease: Secondary | ICD-10-CM | POA: Diagnosis not present

## 2015-05-20 DIAGNOSIS — D509 Iron deficiency anemia, unspecified: Secondary | ICD-10-CM | POA: Diagnosis not present

## 2015-05-23 ENCOUNTER — Encounter: Payer: Self-pay | Admitting: Obstetrics and Gynecology

## 2015-05-23 DIAGNOSIS — N2581 Secondary hyperparathyroidism of renal origin: Secondary | ICD-10-CM | POA: Diagnosis not present

## 2015-05-23 DIAGNOSIS — D509 Iron deficiency anemia, unspecified: Secondary | ICD-10-CM | POA: Diagnosis not present

## 2015-05-23 DIAGNOSIS — N186 End stage renal disease: Secondary | ICD-10-CM | POA: Diagnosis not present

## 2015-05-25 DIAGNOSIS — D509 Iron deficiency anemia, unspecified: Secondary | ICD-10-CM | POA: Diagnosis not present

## 2015-05-25 DIAGNOSIS — N186 End stage renal disease: Secondary | ICD-10-CM | POA: Diagnosis not present

## 2015-05-25 DIAGNOSIS — N2581 Secondary hyperparathyroidism of renal origin: Secondary | ICD-10-CM | POA: Diagnosis not present

## 2015-05-27 DIAGNOSIS — D509 Iron deficiency anemia, unspecified: Secondary | ICD-10-CM | POA: Diagnosis not present

## 2015-05-27 DIAGNOSIS — N2581 Secondary hyperparathyroidism of renal origin: Secondary | ICD-10-CM | POA: Diagnosis not present

## 2015-05-27 DIAGNOSIS — N186 End stage renal disease: Secondary | ICD-10-CM | POA: Diagnosis not present

## 2015-05-30 DIAGNOSIS — N2581 Secondary hyperparathyroidism of renal origin: Secondary | ICD-10-CM | POA: Diagnosis not present

## 2015-05-30 DIAGNOSIS — D509 Iron deficiency anemia, unspecified: Secondary | ICD-10-CM | POA: Diagnosis not present

## 2015-05-30 DIAGNOSIS — N186 End stage renal disease: Secondary | ICD-10-CM | POA: Diagnosis not present

## 2015-06-01 DIAGNOSIS — N2581 Secondary hyperparathyroidism of renal origin: Secondary | ICD-10-CM | POA: Diagnosis not present

## 2015-06-01 DIAGNOSIS — N186 End stage renal disease: Secondary | ICD-10-CM | POA: Diagnosis not present

## 2015-06-01 DIAGNOSIS — D509 Iron deficiency anemia, unspecified: Secondary | ICD-10-CM | POA: Diagnosis not present

## 2015-06-02 DIAGNOSIS — N186 End stage renal disease: Secondary | ICD-10-CM | POA: Diagnosis not present

## 2015-06-02 DIAGNOSIS — D509 Iron deficiency anemia, unspecified: Secondary | ICD-10-CM | POA: Diagnosis not present

## 2015-06-02 DIAGNOSIS — N2581 Secondary hyperparathyroidism of renal origin: Secondary | ICD-10-CM | POA: Diagnosis not present

## 2015-06-06 DIAGNOSIS — D509 Iron deficiency anemia, unspecified: Secondary | ICD-10-CM | POA: Diagnosis not present

## 2015-06-06 DIAGNOSIS — N186 End stage renal disease: Secondary | ICD-10-CM | POA: Diagnosis not present

## 2015-06-06 DIAGNOSIS — N2581 Secondary hyperparathyroidism of renal origin: Secondary | ICD-10-CM | POA: Diagnosis not present

## 2015-06-08 DIAGNOSIS — D509 Iron deficiency anemia, unspecified: Secondary | ICD-10-CM | POA: Diagnosis not present

## 2015-06-08 DIAGNOSIS — N2581 Secondary hyperparathyroidism of renal origin: Secondary | ICD-10-CM | POA: Diagnosis not present

## 2015-06-08 DIAGNOSIS — N186 End stage renal disease: Secondary | ICD-10-CM | POA: Diagnosis not present

## 2015-06-10 DIAGNOSIS — D509 Iron deficiency anemia, unspecified: Secondary | ICD-10-CM | POA: Diagnosis not present

## 2015-06-10 DIAGNOSIS — N2581 Secondary hyperparathyroidism of renal origin: Secondary | ICD-10-CM | POA: Diagnosis not present

## 2015-06-10 DIAGNOSIS — N186 End stage renal disease: Secondary | ICD-10-CM | POA: Diagnosis not present

## 2015-06-12 DIAGNOSIS — I120 Hypertensive chronic kidney disease with stage 5 chronic kidney disease or end stage renal disease: Secondary | ICD-10-CM | POA: Diagnosis not present

## 2015-06-12 DIAGNOSIS — Z992 Dependence on renal dialysis: Secondary | ICD-10-CM | POA: Diagnosis not present

## 2015-06-12 DIAGNOSIS — N186 End stage renal disease: Secondary | ICD-10-CM | POA: Diagnosis not present

## 2015-06-13 DIAGNOSIS — N2581 Secondary hyperparathyroidism of renal origin: Secondary | ICD-10-CM | POA: Diagnosis not present

## 2015-06-13 DIAGNOSIS — N186 End stage renal disease: Secondary | ICD-10-CM | POA: Diagnosis not present

## 2015-06-13 DIAGNOSIS — D509 Iron deficiency anemia, unspecified: Secondary | ICD-10-CM | POA: Diagnosis not present

## 2015-06-15 DIAGNOSIS — N2581 Secondary hyperparathyroidism of renal origin: Secondary | ICD-10-CM | POA: Diagnosis not present

## 2015-06-15 DIAGNOSIS — N186 End stage renal disease: Secondary | ICD-10-CM | POA: Diagnosis not present

## 2015-06-15 DIAGNOSIS — D509 Iron deficiency anemia, unspecified: Secondary | ICD-10-CM | POA: Diagnosis not present

## 2015-06-17 DIAGNOSIS — D509 Iron deficiency anemia, unspecified: Secondary | ICD-10-CM | POA: Diagnosis not present

## 2015-06-17 DIAGNOSIS — N186 End stage renal disease: Secondary | ICD-10-CM | POA: Diagnosis not present

## 2015-06-17 DIAGNOSIS — N2581 Secondary hyperparathyroidism of renal origin: Secondary | ICD-10-CM | POA: Diagnosis not present

## 2015-06-20 DIAGNOSIS — N186 End stage renal disease: Secondary | ICD-10-CM | POA: Diagnosis not present

## 2015-06-20 DIAGNOSIS — D509 Iron deficiency anemia, unspecified: Secondary | ICD-10-CM | POA: Diagnosis not present

## 2015-06-20 DIAGNOSIS — N2581 Secondary hyperparathyroidism of renal origin: Secondary | ICD-10-CM | POA: Diagnosis not present

## 2015-06-21 ENCOUNTER — Other Ambulatory Visit: Payer: 59

## 2015-06-22 DIAGNOSIS — N2581 Secondary hyperparathyroidism of renal origin: Secondary | ICD-10-CM | POA: Diagnosis not present

## 2015-06-22 DIAGNOSIS — N186 End stage renal disease: Secondary | ICD-10-CM | POA: Diagnosis not present

## 2015-06-22 DIAGNOSIS — D509 Iron deficiency anemia, unspecified: Secondary | ICD-10-CM | POA: Diagnosis not present

## 2015-06-24 DIAGNOSIS — D509 Iron deficiency anemia, unspecified: Secondary | ICD-10-CM | POA: Diagnosis not present

## 2015-06-24 DIAGNOSIS — N2581 Secondary hyperparathyroidism of renal origin: Secondary | ICD-10-CM | POA: Diagnosis not present

## 2015-06-24 DIAGNOSIS — N186 End stage renal disease: Secondary | ICD-10-CM | POA: Diagnosis not present

## 2015-06-27 DIAGNOSIS — D509 Iron deficiency anemia, unspecified: Secondary | ICD-10-CM | POA: Diagnosis not present

## 2015-06-27 DIAGNOSIS — N186 End stage renal disease: Secondary | ICD-10-CM | POA: Diagnosis not present

## 2015-06-27 DIAGNOSIS — N2581 Secondary hyperparathyroidism of renal origin: Secondary | ICD-10-CM | POA: Diagnosis not present

## 2015-06-29 DIAGNOSIS — D509 Iron deficiency anemia, unspecified: Secondary | ICD-10-CM | POA: Diagnosis not present

## 2015-06-29 DIAGNOSIS — N186 End stage renal disease: Secondary | ICD-10-CM | POA: Diagnosis not present

## 2015-06-29 DIAGNOSIS — N2581 Secondary hyperparathyroidism of renal origin: Secondary | ICD-10-CM | POA: Diagnosis not present

## 2015-06-30 ENCOUNTER — Other Ambulatory Visit: Payer: Medicare Other

## 2015-06-30 DIAGNOSIS — Z79899 Other long term (current) drug therapy: Secondary | ICD-10-CM | POA: Diagnosis not present

## 2015-06-30 DIAGNOSIS — B20 Human immunodeficiency virus [HIV] disease: Secondary | ICD-10-CM | POA: Diagnosis not present

## 2015-06-30 DIAGNOSIS — Z113 Encounter for screening for infections with a predominantly sexual mode of transmission: Secondary | ICD-10-CM

## 2015-06-30 LAB — COMPREHENSIVE METABOLIC PANEL
ALT: 9 U/L (ref 6–29)
AST: 13 U/L (ref 10–30)
Albumin: 4.3 g/dL (ref 3.6–5.1)
Alkaline Phosphatase: 53 U/L (ref 33–115)
BILIRUBIN TOTAL: 0.4 mg/dL (ref 0.2–1.2)
BUN: 28 mg/dL — ABNORMAL HIGH (ref 7–25)
CALCIUM: 10.2 mg/dL (ref 8.6–10.2)
CHLORIDE: 91 mmol/L — AB (ref 98–110)
CO2: 33 mmol/L — AB (ref 20–31)
Creat: 7.32 mg/dL — ABNORMAL HIGH (ref 0.50–1.10)
Glucose, Bld: 90 mg/dL (ref 65–99)
Potassium: 5.6 mmol/L — ABNORMAL HIGH (ref 3.5–5.3)
Sodium: 138 mmol/L (ref 135–146)
TOTAL PROTEIN: 7.5 g/dL (ref 6.1–8.1)

## 2015-06-30 LAB — CBC WITH DIFFERENTIAL/PLATELET
Basophils Absolute: 0.1 10*3/uL (ref 0.0–0.1)
Basophils Relative: 1 % (ref 0–1)
Eosinophils Absolute: 0.1 10*3/uL (ref 0.0–0.7)
Eosinophils Relative: 1 % (ref 0–5)
HEMATOCRIT: 45.2 % (ref 36.0–46.0)
HEMOGLOBIN: 15.1 g/dL — AB (ref 12.0–15.0)
Lymphocytes Relative: 27 % (ref 12–46)
Lymphs Abs: 2.6 10*3/uL (ref 0.7–4.0)
MCH: 31.7 pg (ref 26.0–34.0)
MCHC: 33.4 g/dL (ref 30.0–36.0)
MCV: 94.8 fL (ref 78.0–100.0)
MONO ABS: 0.5 10*3/uL (ref 0.1–1.0)
MONOS PCT: 5 % (ref 3–12)
MPV: 8.8 fL (ref 8.6–12.4)
NEUTROS ABS: 6.3 10*3/uL (ref 1.7–7.7)
NEUTROS PCT: 66 % (ref 43–77)
Platelets: 305 10*3/uL (ref 150–400)
RBC: 4.77 MIL/uL (ref 3.87–5.11)
RDW: 14 % (ref 11.5–15.5)
WBC: 9.5 10*3/uL (ref 4.0–10.5)

## 2015-06-30 LAB — LIPID PANEL
Cholesterol: 224 mg/dL — ABNORMAL HIGH (ref 125–200)
HDL: 54 mg/dL (ref >=46)
LDL Cholesterol: 149 mg/dL — ABNORMAL HIGH (ref <130)
TRIGLYCERIDES: 103 mg/dL (ref <150)
Total CHOL/HDL Ratio: 4.1 Ratio (ref <=5.0)
VLDL: 21 mg/dL (ref <30)

## 2015-07-01 DIAGNOSIS — N2581 Secondary hyperparathyroidism of renal origin: Secondary | ICD-10-CM | POA: Diagnosis not present

## 2015-07-01 DIAGNOSIS — N186 End stage renal disease: Secondary | ICD-10-CM | POA: Diagnosis not present

## 2015-07-01 DIAGNOSIS — D509 Iron deficiency anemia, unspecified: Secondary | ICD-10-CM | POA: Diagnosis not present

## 2015-07-01 LAB — T-HELPER CELL (CD4) - (RCID CLINIC ONLY)
CD4 % Helper T Cell: 17 % — ABNORMAL LOW (ref 33–55)
CD4 T CELL ABS: 400 /uL (ref 400–2700)

## 2015-07-01 LAB — HIV-1 RNA QUANT-NO REFLEX-BLD
HIV 1 RNA QUANT: 32 {copies}/mL — AB (ref <20)
HIV-1 RNA Quant, Log: 1.51 {Log} — ABNORMAL HIGH (ref <1.30)

## 2015-07-01 LAB — RPR

## 2015-07-04 DIAGNOSIS — N2581 Secondary hyperparathyroidism of renal origin: Secondary | ICD-10-CM | POA: Diagnosis not present

## 2015-07-04 DIAGNOSIS — D509 Iron deficiency anemia, unspecified: Secondary | ICD-10-CM | POA: Diagnosis not present

## 2015-07-04 DIAGNOSIS — N186 End stage renal disease: Secondary | ICD-10-CM | POA: Diagnosis not present

## 2015-07-05 ENCOUNTER — Ambulatory Visit: Payer: 59 | Admitting: Infectious Disease

## 2015-07-06 DIAGNOSIS — D509 Iron deficiency anemia, unspecified: Secondary | ICD-10-CM | POA: Diagnosis not present

## 2015-07-06 DIAGNOSIS — N186 End stage renal disease: Secondary | ICD-10-CM | POA: Diagnosis not present

## 2015-07-06 DIAGNOSIS — N2581 Secondary hyperparathyroidism of renal origin: Secondary | ICD-10-CM | POA: Diagnosis not present

## 2015-07-08 DIAGNOSIS — D509 Iron deficiency anemia, unspecified: Secondary | ICD-10-CM | POA: Diagnosis not present

## 2015-07-08 DIAGNOSIS — N186 End stage renal disease: Secondary | ICD-10-CM | POA: Diagnosis not present

## 2015-07-08 DIAGNOSIS — N2581 Secondary hyperparathyroidism of renal origin: Secondary | ICD-10-CM | POA: Diagnosis not present

## 2015-07-11 DIAGNOSIS — D509 Iron deficiency anemia, unspecified: Secondary | ICD-10-CM | POA: Diagnosis not present

## 2015-07-11 DIAGNOSIS — N2581 Secondary hyperparathyroidism of renal origin: Secondary | ICD-10-CM | POA: Diagnosis not present

## 2015-07-11 DIAGNOSIS — N186 End stage renal disease: Secondary | ICD-10-CM | POA: Diagnosis not present

## 2015-07-13 DIAGNOSIS — I120 Hypertensive chronic kidney disease with stage 5 chronic kidney disease or end stage renal disease: Secondary | ICD-10-CM | POA: Diagnosis not present

## 2015-07-13 DIAGNOSIS — D509 Iron deficiency anemia, unspecified: Secondary | ICD-10-CM | POA: Diagnosis not present

## 2015-07-13 DIAGNOSIS — N2581 Secondary hyperparathyroidism of renal origin: Secondary | ICD-10-CM | POA: Diagnosis not present

## 2015-07-13 DIAGNOSIS — N186 End stage renal disease: Secondary | ICD-10-CM | POA: Diagnosis not present

## 2015-07-13 DIAGNOSIS — Z992 Dependence on renal dialysis: Secondary | ICD-10-CM | POA: Diagnosis not present

## 2015-07-14 ENCOUNTER — Ambulatory Visit (INDEPENDENT_AMBULATORY_CARE_PROVIDER_SITE_OTHER): Payer: Medicare Other | Admitting: Infectious Disease

## 2015-07-14 ENCOUNTER — Encounter: Payer: Self-pay | Admitting: Infectious Disease

## 2015-07-14 VITALS — BP 136/93 | HR 98 | Temp 98.3°F | Wt 195.0 lb

## 2015-07-14 DIAGNOSIS — Z992 Dependence on renal dialysis: Secondary | ICD-10-CM | POA: Diagnosis not present

## 2015-07-14 DIAGNOSIS — Z23 Encounter for immunization: Secondary | ICD-10-CM

## 2015-07-14 DIAGNOSIS — B20 Human immunodeficiency virus [HIV] disease: Secondary | ICD-10-CM | POA: Diagnosis not present

## 2015-07-14 DIAGNOSIS — N186 End stage renal disease: Secondary | ICD-10-CM | POA: Diagnosis not present

## 2015-07-14 DIAGNOSIS — I10 Essential (primary) hypertension: Secondary | ICD-10-CM

## 2015-07-14 DIAGNOSIS — E785 Hyperlipidemia, unspecified: Secondary | ICD-10-CM

## 2015-07-14 NOTE — Progress Notes (Signed)
   Subjective:    Patient ID: Tiffany Golden, female    DOB: Jul 15, 1976, 39 y.o.   MRN: [id number]  HPI  39  y.o. female past medical history significant for HIV and AIDS that was diagnosed approximately 18 years ago. Patient was initially treated for HIV while pregnant. She was followed initially at [facility] and was involved in a clinical trials there. Last notes from [facility] indicated that she had thought of care between 2002 and 2010. When last seen she had undetectable viral load and healthy CD4 count and was on a regimen of Crixivan 800 mg BID, D4T 40 mg BID, Epivir 150 mg BID    She does also have comorbid end-stage renal disease and is on hemodialysis since 2012 I met her in the hospital and ultimately restarted on TIVICAY bid and renally dosed epvir as well as wekly viread, then changed to daily ABC, epivir and BID Tivicay and now QD Tivicay, ziagen and epivir   Lab Results  Component Value Date   HIV1RNAQUANT 32* 06/30/2015    Lab Results  Component Value Date   CD4TABS 400 06/30/2015   CD4TABS 500 12/30/2014   CD4TABS 380* 08/03/2014     She continues to be followed by Dr. Moshe Cipro and Dr. Jimmy Footman and also is seen by primary care physician. SHe is seeing Mallory Shirk for Ob/GYn care.  She is being evaluated by [facility]  for possible kidney transplant.    Review of Systems  Constitutional: Negative for fever, chills, diaphoresis, activity change, appetite change, fatigue and unexpected weight change.  HENT: Negative for congestion, rhinorrhea, sinus pressure, sneezing, sore throat and trouble swallowing.   Eyes: Negative for photophobia and visual disturbance.  Respiratory: Negative for cough, chest tightness, shortness of breath, wheezing and stridor.   Cardiovascular: Negative for chest pain, palpitations and leg swelling.  Gastrointestinal: Negative for nausea, vomiting, abdominal pain, diarrhea, constipation, blood in stool, abdominal distention and  anal bleeding.  Musculoskeletal: Negative for myalgias, back pain, joint swelling, arthralgias and gait problem.  Skin: Negative for color change, pallor, rash and wound.  Neurological: Negative for dizziness, tremors, weakness and light-headedness.  Hematological: Negative for adenopathy. Does not bruise/bleed easily.  Psychiatric/Behavioral: Negative for behavioral problems, confusion, sleep disturbance, dysphoric mood, decreased concentration and agitation.       Objective:   Physical Exam  Constitutional: She is oriented to person, place, and time. She appears well-developed and well-nourished. No distress.  HENT:  Head: Normocephalic and atraumatic.  Mouth/Throat: No oropharyngeal exudate.  Eyes: Conjunctivae and EOM are normal. No scleral icterus.  Neck: Normal range of motion. Neck supple.  Cardiovascular: Normal rate and regular rhythm.   Pulmonary/Chest: Effort normal. No respiratory distress. She has no wheezes.  Abdominal: She exhibits no distension.  Musculoskeletal: She exhibits no edema or tenderness.  Neurological: She is alert and oriented to person, place, and time. Coordination normal.  Skin: Skin is warm and dry. No rash noted. She is not diaphoretic. No erythema. No pallor.  Psychiatric: She has a normal mood and affect. Her behavior is normal. Judgment and thought content normal.          Assessment & Plan:  HIV and AIDS: Doing a fantastic job a lot to change her anti-retroviral regimen,continue current regimen   Hypertension : normotensive today  ESRD on HD: --continue HD and awaiting kidney transplantation evaluation

## 2015-07-15 DIAGNOSIS — N186 End stage renal disease: Secondary | ICD-10-CM | POA: Diagnosis not present

## 2015-07-15 DIAGNOSIS — N2581 Secondary hyperparathyroidism of renal origin: Secondary | ICD-10-CM | POA: Diagnosis not present

## 2015-07-15 DIAGNOSIS — D509 Iron deficiency anemia, unspecified: Secondary | ICD-10-CM | POA: Diagnosis not present

## 2015-07-16 ENCOUNTER — Other Ambulatory Visit: Payer: Self-pay | Admitting: Infectious Disease

## 2015-07-16 DIAGNOSIS — B20 Human immunodeficiency virus [HIV] disease: Secondary | ICD-10-CM

## 2015-07-18 DIAGNOSIS — N186 End stage renal disease: Secondary | ICD-10-CM | POA: Diagnosis not present

## 2015-07-18 DIAGNOSIS — N2581 Secondary hyperparathyroidism of renal origin: Secondary | ICD-10-CM | POA: Diagnosis not present

## 2015-07-18 DIAGNOSIS — D509 Iron deficiency anemia, unspecified: Secondary | ICD-10-CM | POA: Diagnosis not present

## 2015-07-20 DIAGNOSIS — N186 End stage renal disease: Secondary | ICD-10-CM | POA: Diagnosis not present

## 2015-07-20 DIAGNOSIS — D509 Iron deficiency anemia, unspecified: Secondary | ICD-10-CM | POA: Diagnosis not present

## 2015-07-20 DIAGNOSIS — N2581 Secondary hyperparathyroidism of renal origin: Secondary | ICD-10-CM | POA: Diagnosis not present

## 2015-07-22 ENCOUNTER — Other Ambulatory Visit: Payer: Self-pay | Admitting: *Deleted

## 2015-07-22 DIAGNOSIS — N2581 Secondary hyperparathyroidism of renal origin: Secondary | ICD-10-CM | POA: Diagnosis not present

## 2015-07-22 DIAGNOSIS — D509 Iron deficiency anemia, unspecified: Secondary | ICD-10-CM | POA: Diagnosis not present

## 2015-07-22 DIAGNOSIS — N186 End stage renal disease: Secondary | ICD-10-CM | POA: Diagnosis not present

## 2015-07-22 DIAGNOSIS — B20 Human immunodeficiency virus [HIV] disease: Secondary | ICD-10-CM

## 2015-07-22 MED ORDER — LAMIVUDINE 10 MG/ML PO SOLN: ORAL | Status: DC

## 2015-07-25 DIAGNOSIS — N2581 Secondary hyperparathyroidism of renal origin: Secondary | ICD-10-CM | POA: Diagnosis not present

## 2015-07-25 DIAGNOSIS — D509 Iron deficiency anemia, unspecified: Secondary | ICD-10-CM | POA: Diagnosis not present

## 2015-07-25 DIAGNOSIS — N186 End stage renal disease: Secondary | ICD-10-CM | POA: Diagnosis not present

## 2015-07-27 DIAGNOSIS — D509 Iron deficiency anemia, unspecified: Secondary | ICD-10-CM | POA: Diagnosis not present

## 2015-07-27 DIAGNOSIS — N2581 Secondary hyperparathyroidism of renal origin: Secondary | ICD-10-CM | POA: Diagnosis not present

## 2015-07-27 DIAGNOSIS — N186 End stage renal disease: Secondary | ICD-10-CM | POA: Diagnosis not present

## 2015-07-29 DIAGNOSIS — N186 End stage renal disease: Secondary | ICD-10-CM | POA: Diagnosis not present

## 2015-07-29 DIAGNOSIS — D509 Iron deficiency anemia, unspecified: Secondary | ICD-10-CM | POA: Diagnosis not present

## 2015-07-29 DIAGNOSIS — N2581 Secondary hyperparathyroidism of renal origin: Secondary | ICD-10-CM | POA: Diagnosis not present

## 2015-08-01 DIAGNOSIS — D509 Iron deficiency anemia, unspecified: Secondary | ICD-10-CM | POA: Diagnosis not present

## 2015-08-01 DIAGNOSIS — N2581 Secondary hyperparathyroidism of renal origin: Secondary | ICD-10-CM | POA: Diagnosis not present

## 2015-08-01 DIAGNOSIS — N186 End stage renal disease: Secondary | ICD-10-CM | POA: Diagnosis not present

## 2015-08-03 DIAGNOSIS — N2581 Secondary hyperparathyroidism of renal origin: Secondary | ICD-10-CM | POA: Diagnosis not present

## 2015-08-03 DIAGNOSIS — N186 End stage renal disease: Secondary | ICD-10-CM | POA: Diagnosis not present

## 2015-08-03 DIAGNOSIS — D509 Iron deficiency anemia, unspecified: Secondary | ICD-10-CM | POA: Diagnosis not present

## 2015-08-05 DIAGNOSIS — N186 End stage renal disease: Secondary | ICD-10-CM | POA: Diagnosis not present

## 2015-08-05 DIAGNOSIS — N2581 Secondary hyperparathyroidism of renal origin: Secondary | ICD-10-CM | POA: Diagnosis not present

## 2015-08-05 DIAGNOSIS — D509 Iron deficiency anemia, unspecified: Secondary | ICD-10-CM | POA: Diagnosis not present

## 2015-08-08 DIAGNOSIS — D509 Iron deficiency anemia, unspecified: Secondary | ICD-10-CM | POA: Diagnosis not present

## 2015-08-08 DIAGNOSIS — N2581 Secondary hyperparathyroidism of renal origin: Secondary | ICD-10-CM | POA: Diagnosis not present

## 2015-08-08 DIAGNOSIS — N186 End stage renal disease: Secondary | ICD-10-CM | POA: Diagnosis not present

## 2015-08-10 DIAGNOSIS — N186 End stage renal disease: Secondary | ICD-10-CM | POA: Diagnosis not present

## 2015-08-10 DIAGNOSIS — D509 Iron deficiency anemia, unspecified: Secondary | ICD-10-CM | POA: Diagnosis not present

## 2015-08-10 DIAGNOSIS — N2581 Secondary hyperparathyroidism of renal origin: Secondary | ICD-10-CM | POA: Diagnosis not present

## 2015-08-12 DIAGNOSIS — I120 Hypertensive chronic kidney disease with stage 5 chronic kidney disease or end stage renal disease: Secondary | ICD-10-CM | POA: Diagnosis not present

## 2015-08-12 DIAGNOSIS — N2581 Secondary hyperparathyroidism of renal origin: Secondary | ICD-10-CM | POA: Diagnosis not present

## 2015-08-12 DIAGNOSIS — N186 End stage renal disease: Secondary | ICD-10-CM | POA: Diagnosis not present

## 2015-08-12 DIAGNOSIS — D509 Iron deficiency anemia, unspecified: Secondary | ICD-10-CM | POA: Diagnosis not present

## 2015-08-12 DIAGNOSIS — Z992 Dependence on renal dialysis: Secondary | ICD-10-CM | POA: Diagnosis not present

## 2015-08-15 DIAGNOSIS — N186 End stage renal disease: Secondary | ICD-10-CM | POA: Diagnosis not present

## 2015-08-15 DIAGNOSIS — N2581 Secondary hyperparathyroidism of renal origin: Secondary | ICD-10-CM | POA: Diagnosis not present

## 2015-08-15 DIAGNOSIS — D509 Iron deficiency anemia, unspecified: Secondary | ICD-10-CM | POA: Diagnosis not present

## 2015-08-17 DIAGNOSIS — N186 End stage renal disease: Secondary | ICD-10-CM | POA: Diagnosis not present

## 2015-08-17 DIAGNOSIS — N2581 Secondary hyperparathyroidism of renal origin: Secondary | ICD-10-CM | POA: Diagnosis not present

## 2015-08-17 DIAGNOSIS — D509 Iron deficiency anemia, unspecified: Secondary | ICD-10-CM | POA: Diagnosis not present

## 2015-08-19 DIAGNOSIS — N2581 Secondary hyperparathyroidism of renal origin: Secondary | ICD-10-CM | POA: Diagnosis not present

## 2015-08-19 DIAGNOSIS — D509 Iron deficiency anemia, unspecified: Secondary | ICD-10-CM | POA: Diagnosis not present

## 2015-08-19 DIAGNOSIS — N186 End stage renal disease: Secondary | ICD-10-CM | POA: Diagnosis not present

## 2015-08-22 DIAGNOSIS — N186 End stage renal disease: Secondary | ICD-10-CM | POA: Diagnosis not present

## 2015-08-22 DIAGNOSIS — D509 Iron deficiency anemia, unspecified: Secondary | ICD-10-CM | POA: Diagnosis not present

## 2015-08-22 DIAGNOSIS — N2581 Secondary hyperparathyroidism of renal origin: Secondary | ICD-10-CM | POA: Diagnosis not present

## 2015-08-25 DIAGNOSIS — N186 End stage renal disease: Secondary | ICD-10-CM | POA: Diagnosis not present

## 2015-08-25 DIAGNOSIS — D509 Iron deficiency anemia, unspecified: Secondary | ICD-10-CM | POA: Diagnosis not present

## 2015-08-25 DIAGNOSIS — N2581 Secondary hyperparathyroidism of renal origin: Secondary | ICD-10-CM | POA: Diagnosis not present

## 2015-08-26 DIAGNOSIS — N186 End stage renal disease: Secondary | ICD-10-CM | POA: Diagnosis not present

## 2015-08-26 DIAGNOSIS — D509 Iron deficiency anemia, unspecified: Secondary | ICD-10-CM | POA: Diagnosis not present

## 2015-08-26 DIAGNOSIS — N2581 Secondary hyperparathyroidism of renal origin: Secondary | ICD-10-CM | POA: Diagnosis not present

## 2015-08-29 DIAGNOSIS — D509 Iron deficiency anemia, unspecified: Secondary | ICD-10-CM | POA: Diagnosis not present

## 2015-08-29 DIAGNOSIS — N186 End stage renal disease: Secondary | ICD-10-CM | POA: Diagnosis not present

## 2015-08-29 DIAGNOSIS — N2581 Secondary hyperparathyroidism of renal origin: Secondary | ICD-10-CM | POA: Diagnosis not present

## 2015-08-31 DIAGNOSIS — D509 Iron deficiency anemia, unspecified: Secondary | ICD-10-CM | POA: Diagnosis not present

## 2015-08-31 DIAGNOSIS — N2581 Secondary hyperparathyroidism of renal origin: Secondary | ICD-10-CM | POA: Diagnosis not present

## 2015-08-31 DIAGNOSIS — N186 End stage renal disease: Secondary | ICD-10-CM | POA: Diagnosis not present

## 2015-09-02 DIAGNOSIS — D509 Iron deficiency anemia, unspecified: Secondary | ICD-10-CM | POA: Diagnosis not present

## 2015-09-02 DIAGNOSIS — N2581 Secondary hyperparathyroidism of renal origin: Secondary | ICD-10-CM | POA: Diagnosis not present

## 2015-09-02 DIAGNOSIS — N186 End stage renal disease: Secondary | ICD-10-CM | POA: Diagnosis not present

## 2015-09-05 DIAGNOSIS — N2581 Secondary hyperparathyroidism of renal origin: Secondary | ICD-10-CM | POA: Diagnosis not present

## 2015-09-05 DIAGNOSIS — D509 Iron deficiency anemia, unspecified: Secondary | ICD-10-CM | POA: Diagnosis not present

## 2015-09-05 DIAGNOSIS — N186 End stage renal disease: Secondary | ICD-10-CM | POA: Diagnosis not present

## 2015-09-07 DIAGNOSIS — N186 End stage renal disease: Secondary | ICD-10-CM | POA: Diagnosis not present

## 2015-09-07 DIAGNOSIS — N2581 Secondary hyperparathyroidism of renal origin: Secondary | ICD-10-CM | POA: Diagnosis not present

## 2015-09-07 DIAGNOSIS — D509 Iron deficiency anemia, unspecified: Secondary | ICD-10-CM | POA: Diagnosis not present

## 2015-09-09 DIAGNOSIS — N186 End stage renal disease: Secondary | ICD-10-CM | POA: Diagnosis not present

## 2015-09-09 DIAGNOSIS — D509 Iron deficiency anemia, unspecified: Secondary | ICD-10-CM | POA: Diagnosis not present

## 2015-09-09 DIAGNOSIS — N2581 Secondary hyperparathyroidism of renal origin: Secondary | ICD-10-CM | POA: Diagnosis not present

## 2015-09-12 DIAGNOSIS — N186 End stage renal disease: Secondary | ICD-10-CM | POA: Diagnosis not present

## 2015-09-12 DIAGNOSIS — N2581 Secondary hyperparathyroidism of renal origin: Secondary | ICD-10-CM | POA: Diagnosis not present

## 2015-09-12 DIAGNOSIS — D509 Iron deficiency anemia, unspecified: Secondary | ICD-10-CM | POA: Diagnosis not present

## 2015-09-12 DIAGNOSIS — I120 Hypertensive chronic kidney disease with stage 5 chronic kidney disease or end stage renal disease: Secondary | ICD-10-CM | POA: Diagnosis not present

## 2015-09-12 DIAGNOSIS — Z992 Dependence on renal dialysis: Secondary | ICD-10-CM | POA: Diagnosis not present

## 2015-09-14 DIAGNOSIS — D509 Iron deficiency anemia, unspecified: Secondary | ICD-10-CM | POA: Diagnosis not present

## 2015-09-14 DIAGNOSIS — N2581 Secondary hyperparathyroidism of renal origin: Secondary | ICD-10-CM | POA: Diagnosis not present

## 2015-09-14 DIAGNOSIS — N186 End stage renal disease: Secondary | ICD-10-CM | POA: Diagnosis not present

## 2015-09-16 DIAGNOSIS — N2581 Secondary hyperparathyroidism of renal origin: Secondary | ICD-10-CM | POA: Diagnosis not present

## 2015-09-16 DIAGNOSIS — N186 End stage renal disease: Secondary | ICD-10-CM | POA: Diagnosis not present

## 2015-09-16 DIAGNOSIS — D509 Iron deficiency anemia, unspecified: Secondary | ICD-10-CM | POA: Diagnosis not present

## 2015-09-19 DIAGNOSIS — D509 Iron deficiency anemia, unspecified: Secondary | ICD-10-CM | POA: Diagnosis not present

## 2015-09-19 DIAGNOSIS — N186 End stage renal disease: Secondary | ICD-10-CM | POA: Diagnosis not present

## 2015-09-19 DIAGNOSIS — N2581 Secondary hyperparathyroidism of renal origin: Secondary | ICD-10-CM | POA: Diagnosis not present

## 2015-09-21 DIAGNOSIS — N186 End stage renal disease: Secondary | ICD-10-CM | POA: Diagnosis not present

## 2015-09-21 DIAGNOSIS — D509 Iron deficiency anemia, unspecified: Secondary | ICD-10-CM | POA: Diagnosis not present

## 2015-09-21 DIAGNOSIS — N2581 Secondary hyperparathyroidism of renal origin: Secondary | ICD-10-CM | POA: Diagnosis not present

## 2015-09-23 DIAGNOSIS — D509 Iron deficiency anemia, unspecified: Secondary | ICD-10-CM | POA: Diagnosis not present

## 2015-09-23 DIAGNOSIS — N186 End stage renal disease: Secondary | ICD-10-CM | POA: Diagnosis not present

## 2015-09-23 DIAGNOSIS — N2581 Secondary hyperparathyroidism of renal origin: Secondary | ICD-10-CM | POA: Diagnosis not present

## 2015-09-26 DIAGNOSIS — N186 End stage renal disease: Secondary | ICD-10-CM | POA: Diagnosis not present

## 2015-09-26 DIAGNOSIS — D509 Iron deficiency anemia, unspecified: Secondary | ICD-10-CM | POA: Diagnosis not present

## 2015-09-26 DIAGNOSIS — N2581 Secondary hyperparathyroidism of renal origin: Secondary | ICD-10-CM | POA: Diagnosis not present

## 2015-09-28 DIAGNOSIS — N2581 Secondary hyperparathyroidism of renal origin: Secondary | ICD-10-CM | POA: Diagnosis not present

## 2015-09-28 DIAGNOSIS — D509 Iron deficiency anemia, unspecified: Secondary | ICD-10-CM | POA: Diagnosis not present

## 2015-09-28 DIAGNOSIS — N186 End stage renal disease: Secondary | ICD-10-CM | POA: Diagnosis not present

## 2015-09-29 DIAGNOSIS — I120 Hypertensive chronic kidney disease with stage 5 chronic kidney disease or end stage renal disease: Secondary | ICD-10-CM | POA: Diagnosis not present

## 2015-09-29 DIAGNOSIS — N186 End stage renal disease: Secondary | ICD-10-CM | POA: Diagnosis not present

## 2015-09-29 DIAGNOSIS — Z7682 Awaiting organ transplant status: Secondary | ICD-10-CM | POA: Diagnosis not present

## 2015-09-29 DIAGNOSIS — Z21 Asymptomatic human immunodeficiency virus [HIV] infection status: Secondary | ICD-10-CM | POA: Diagnosis not present

## 2015-09-29 DIAGNOSIS — Z79899 Other long term (current) drug therapy: Secondary | ICD-10-CM | POA: Diagnosis not present

## 2015-09-29 DIAGNOSIS — D849 Immunodeficiency, unspecified: Secondary | ICD-10-CM | POA: Diagnosis not present

## 2015-09-29 DIAGNOSIS — Z23 Encounter for immunization: Secondary | ICD-10-CM | POA: Diagnosis not present

## 2015-09-29 DIAGNOSIS — Z992 Dependence on renal dialysis: Secondary | ICD-10-CM | POA: Diagnosis not present

## 2015-09-30 DIAGNOSIS — N186 End stage renal disease: Secondary | ICD-10-CM | POA: Diagnosis not present

## 2015-09-30 DIAGNOSIS — D509 Iron deficiency anemia, unspecified: Secondary | ICD-10-CM | POA: Diagnosis not present

## 2015-09-30 DIAGNOSIS — N2581 Secondary hyperparathyroidism of renal origin: Secondary | ICD-10-CM | POA: Diagnosis not present

## 2015-10-03 DIAGNOSIS — N186 End stage renal disease: Secondary | ICD-10-CM | POA: Diagnosis not present

## 2015-10-03 DIAGNOSIS — N2581 Secondary hyperparathyroidism of renal origin: Secondary | ICD-10-CM | POA: Diagnosis not present

## 2015-10-03 DIAGNOSIS — D509 Iron deficiency anemia, unspecified: Secondary | ICD-10-CM | POA: Diagnosis not present

## 2015-10-05 DIAGNOSIS — N2581 Secondary hyperparathyroidism of renal origin: Secondary | ICD-10-CM | POA: Diagnosis not present

## 2015-10-05 DIAGNOSIS — D509 Iron deficiency anemia, unspecified: Secondary | ICD-10-CM | POA: Diagnosis not present

## 2015-10-05 DIAGNOSIS — N186 End stage renal disease: Secondary | ICD-10-CM | POA: Diagnosis not present

## 2015-10-08 DIAGNOSIS — N2581 Secondary hyperparathyroidism of renal origin: Secondary | ICD-10-CM | POA: Diagnosis not present

## 2015-10-08 DIAGNOSIS — D509 Iron deficiency anemia, unspecified: Secondary | ICD-10-CM | POA: Diagnosis not present

## 2015-10-08 DIAGNOSIS — N186 End stage renal disease: Secondary | ICD-10-CM | POA: Diagnosis not present

## 2015-10-10 DIAGNOSIS — N2581 Secondary hyperparathyroidism of renal origin: Secondary | ICD-10-CM | POA: Diagnosis not present

## 2015-10-10 DIAGNOSIS — D509 Iron deficiency anemia, unspecified: Secondary | ICD-10-CM | POA: Diagnosis not present

## 2015-10-10 DIAGNOSIS — N186 End stage renal disease: Secondary | ICD-10-CM | POA: Diagnosis not present

## 2015-10-12 DIAGNOSIS — N2581 Secondary hyperparathyroidism of renal origin: Secondary | ICD-10-CM | POA: Diagnosis not present

## 2015-10-12 DIAGNOSIS — I120 Hypertensive chronic kidney disease with stage 5 chronic kidney disease or end stage renal disease: Secondary | ICD-10-CM | POA: Diagnosis not present

## 2015-10-12 DIAGNOSIS — Z992 Dependence on renal dialysis: Secondary | ICD-10-CM | POA: Diagnosis not present

## 2015-10-12 DIAGNOSIS — D509 Iron deficiency anemia, unspecified: Secondary | ICD-10-CM | POA: Diagnosis not present

## 2015-10-12 DIAGNOSIS — N186 End stage renal disease: Secondary | ICD-10-CM | POA: Diagnosis not present

## 2015-10-14 DIAGNOSIS — N186 End stage renal disease: Secondary | ICD-10-CM | POA: Diagnosis not present

## 2015-10-14 DIAGNOSIS — N2581 Secondary hyperparathyroidism of renal origin: Secondary | ICD-10-CM | POA: Diagnosis not present

## 2015-10-14 DIAGNOSIS — D509 Iron deficiency anemia, unspecified: Secondary | ICD-10-CM | POA: Diagnosis not present

## 2015-10-17 DIAGNOSIS — D509 Iron deficiency anemia, unspecified: Secondary | ICD-10-CM | POA: Diagnosis not present

## 2015-10-17 DIAGNOSIS — N2581 Secondary hyperparathyroidism of renal origin: Secondary | ICD-10-CM | POA: Diagnosis not present

## 2015-10-17 DIAGNOSIS — N186 End stage renal disease: Secondary | ICD-10-CM | POA: Diagnosis not present

## 2015-10-18 ENCOUNTER — Ambulatory Visit (INDEPENDENT_AMBULATORY_CARE_PROVIDER_SITE_OTHER): Payer: Medicare Other | Admitting: Family Medicine

## 2015-10-18 ENCOUNTER — Encounter: Payer: Self-pay | Admitting: Family Medicine

## 2015-10-18 VITALS — BP 128/76 | Temp 98.5°F | Ht 61.0 in | Wt 201.0 lb

## 2015-10-18 DIAGNOSIS — J019 Acute sinusitis, unspecified: Secondary | ICD-10-CM | POA: Diagnosis not present

## 2015-10-18 MED ORDER — FLUCONAZOLE 150 MG PO TABS: 150.0000 mg | ORAL_TABLET | Freq: Once | ORAL | Status: DC

## 2015-10-18 MED ORDER — AMOXICILLIN-POT CLAVULANATE 875-125 MG PO TABS: 1.0000 | ORAL_TABLET | Freq: Two times a day (BID) | ORAL | Status: DC

## 2015-10-18 MED ORDER — ALBUTEROL SULFATE HFA 108 (90 BASE) MCG/ACT IN AERS: 2.0000 | INHALATION_SPRAY | RESPIRATORY_TRACT | Status: DC | PRN

## 2015-10-18 NOTE — Progress Notes (Signed)
   Subjective:    Patient ID: Tiffany Golden, female    DOB: 1976/03/26, 39 y.o.   MRN: [id number]  Cough This is a new problem. Episode onset: 6 days ago. Associated symptoms include ear pain, nasal congestion, rhinorrhea and a sore throat. Pertinent negatives include no chest pain, fever, shortness of breath or wheezing. Associated symptoms comments: Eye pain. Treatments tried: robitussin dm and benadryl.   Start off with a viral illness them progressed in the sinus pressure pain discomfort patient denies wheezing or difficulty breathing   Review of Systems  Constitutional: Negative for fever and activity change.  HENT: Positive for congestion, ear pain, rhinorrhea and sore throat.   Eyes: Negative for discharge.  Respiratory: Positive for cough. Negative for shortness of breath and wheezing.   Cardiovascular: Negative for chest pain.       Objective:   Physical Exam  Constitutional: She appears well-developed.  HENT:  Head: Normocephalic.  Nose: Nose normal.  Mouth/Throat: Oropharynx is clear and moist. No oropharyngeal exudate.  Neck: Neck supple.  Cardiovascular: Normal rate and normal heart sounds.   No murmur heard. Pulmonary/Chest: Effort normal and breath sounds normal. She has no wheezes.  Lymphadenopathy:    She has no cervical adenopathy.  Skin: Skin is warm and dry.  Nursing note and vitals reviewed.         Assessment & Plan:  Acute rhinosinusitis Secondary cough Underlying viral illness Antibiotics prescribed warning signs discussed

## 2015-10-19 DIAGNOSIS — N2581 Secondary hyperparathyroidism of renal origin: Secondary | ICD-10-CM | POA: Diagnosis not present

## 2015-10-19 DIAGNOSIS — D509 Iron deficiency anemia, unspecified: Secondary | ICD-10-CM | POA: Diagnosis not present

## 2015-10-19 DIAGNOSIS — N186 End stage renal disease: Secondary | ICD-10-CM | POA: Diagnosis not present

## 2015-10-21 DIAGNOSIS — N186 End stage renal disease: Secondary | ICD-10-CM | POA: Diagnosis not present

## 2015-10-21 DIAGNOSIS — D509 Iron deficiency anemia, unspecified: Secondary | ICD-10-CM | POA: Diagnosis not present

## 2015-10-21 DIAGNOSIS — N2581 Secondary hyperparathyroidism of renal origin: Secondary | ICD-10-CM | POA: Diagnosis not present

## 2015-10-24 DIAGNOSIS — N186 End stage renal disease: Secondary | ICD-10-CM | POA: Diagnosis not present

## 2015-10-24 DIAGNOSIS — N2581 Secondary hyperparathyroidism of renal origin: Secondary | ICD-10-CM | POA: Diagnosis not present

## 2015-10-24 DIAGNOSIS — D509 Iron deficiency anemia, unspecified: Secondary | ICD-10-CM | POA: Diagnosis not present

## 2015-10-26 DIAGNOSIS — N186 End stage renal disease: Secondary | ICD-10-CM | POA: Diagnosis not present

## 2015-10-26 DIAGNOSIS — D509 Iron deficiency anemia, unspecified: Secondary | ICD-10-CM | POA: Diagnosis not present

## 2015-10-26 DIAGNOSIS — N2581 Secondary hyperparathyroidism of renal origin: Secondary | ICD-10-CM | POA: Diagnosis not present

## 2015-10-28 DIAGNOSIS — N186 End stage renal disease: Secondary | ICD-10-CM | POA: Diagnosis not present

## 2015-10-28 DIAGNOSIS — N2581 Secondary hyperparathyroidism of renal origin: Secondary | ICD-10-CM | POA: Diagnosis not present

## 2015-10-28 DIAGNOSIS — D509 Iron deficiency anemia, unspecified: Secondary | ICD-10-CM | POA: Diagnosis not present

## 2015-10-31 DIAGNOSIS — D509 Iron deficiency anemia, unspecified: Secondary | ICD-10-CM | POA: Diagnosis not present

## 2015-10-31 DIAGNOSIS — N2581 Secondary hyperparathyroidism of renal origin: Secondary | ICD-10-CM | POA: Diagnosis not present

## 2015-10-31 DIAGNOSIS — N186 End stage renal disease: Secondary | ICD-10-CM | POA: Diagnosis not present

## 2015-11-02 DIAGNOSIS — N2581 Secondary hyperparathyroidism of renal origin: Secondary | ICD-10-CM | POA: Diagnosis not present

## 2015-11-02 DIAGNOSIS — N186 End stage renal disease: Secondary | ICD-10-CM | POA: Diagnosis not present

## 2015-11-02 DIAGNOSIS — D509 Iron deficiency anemia, unspecified: Secondary | ICD-10-CM | POA: Diagnosis not present

## 2015-11-04 DIAGNOSIS — N186 End stage renal disease: Secondary | ICD-10-CM | POA: Diagnosis not present

## 2015-11-04 DIAGNOSIS — N2581 Secondary hyperparathyroidism of renal origin: Secondary | ICD-10-CM | POA: Diagnosis not present

## 2015-11-04 DIAGNOSIS — D509 Iron deficiency anemia, unspecified: Secondary | ICD-10-CM | POA: Diagnosis not present

## 2015-11-07 DIAGNOSIS — N186 End stage renal disease: Secondary | ICD-10-CM | POA: Diagnosis not present

## 2015-11-07 DIAGNOSIS — N2581 Secondary hyperparathyroidism of renal origin: Secondary | ICD-10-CM | POA: Diagnosis not present

## 2015-11-07 DIAGNOSIS — D509 Iron deficiency anemia, unspecified: Secondary | ICD-10-CM | POA: Diagnosis not present

## 2015-11-09 DIAGNOSIS — D509 Iron deficiency anemia, unspecified: Secondary | ICD-10-CM | POA: Diagnosis not present

## 2015-11-09 DIAGNOSIS — N2581 Secondary hyperparathyroidism of renal origin: Secondary | ICD-10-CM | POA: Diagnosis not present

## 2015-11-09 DIAGNOSIS — N186 End stage renal disease: Secondary | ICD-10-CM | POA: Diagnosis not present

## 2015-11-11 DIAGNOSIS — N186 End stage renal disease: Secondary | ICD-10-CM | POA: Diagnosis not present

## 2015-11-11 DIAGNOSIS — N2581 Secondary hyperparathyroidism of renal origin: Secondary | ICD-10-CM | POA: Diagnosis not present

## 2015-11-11 DIAGNOSIS — D509 Iron deficiency anemia, unspecified: Secondary | ICD-10-CM | POA: Diagnosis not present

## 2015-11-12 DIAGNOSIS — Z992 Dependence on renal dialysis: Secondary | ICD-10-CM | POA: Diagnosis not present

## 2015-11-12 DIAGNOSIS — N186 End stage renal disease: Secondary | ICD-10-CM | POA: Diagnosis not present

## 2015-11-12 DIAGNOSIS — I120 Hypertensive chronic kidney disease with stage 5 chronic kidney disease or end stage renal disease: Secondary | ICD-10-CM | POA: Diagnosis not present

## 2015-11-14 DIAGNOSIS — N2581 Secondary hyperparathyroidism of renal origin: Secondary | ICD-10-CM | POA: Diagnosis not present

## 2015-11-14 DIAGNOSIS — N186 End stage renal disease: Secondary | ICD-10-CM | POA: Diagnosis not present

## 2015-11-14 DIAGNOSIS — D509 Iron deficiency anemia, unspecified: Secondary | ICD-10-CM | POA: Diagnosis not present

## 2015-11-16 DIAGNOSIS — D509 Iron deficiency anemia, unspecified: Secondary | ICD-10-CM | POA: Diagnosis not present

## 2015-11-16 DIAGNOSIS — N186 End stage renal disease: Secondary | ICD-10-CM | POA: Diagnosis not present

## 2015-11-16 DIAGNOSIS — N2581 Secondary hyperparathyroidism of renal origin: Secondary | ICD-10-CM | POA: Diagnosis not present

## 2015-11-18 DIAGNOSIS — D509 Iron deficiency anemia, unspecified: Secondary | ICD-10-CM | POA: Diagnosis not present

## 2015-11-18 DIAGNOSIS — N2581 Secondary hyperparathyroidism of renal origin: Secondary | ICD-10-CM | POA: Diagnosis not present

## 2015-11-18 DIAGNOSIS — N186 End stage renal disease: Secondary | ICD-10-CM | POA: Diagnosis not present

## 2015-11-21 DIAGNOSIS — N2581 Secondary hyperparathyroidism of renal origin: Secondary | ICD-10-CM | POA: Diagnosis not present

## 2015-11-21 DIAGNOSIS — D509 Iron deficiency anemia, unspecified: Secondary | ICD-10-CM | POA: Diagnosis not present

## 2015-11-21 DIAGNOSIS — N186 End stage renal disease: Secondary | ICD-10-CM | POA: Diagnosis not present

## 2015-11-23 DIAGNOSIS — N2581 Secondary hyperparathyroidism of renal origin: Secondary | ICD-10-CM | POA: Diagnosis not present

## 2015-11-23 DIAGNOSIS — D509 Iron deficiency anemia, unspecified: Secondary | ICD-10-CM | POA: Diagnosis not present

## 2015-11-23 DIAGNOSIS — N186 End stage renal disease: Secondary | ICD-10-CM | POA: Diagnosis not present

## 2015-11-25 DIAGNOSIS — D509 Iron deficiency anemia, unspecified: Secondary | ICD-10-CM | POA: Diagnosis not present

## 2015-11-25 DIAGNOSIS — N2581 Secondary hyperparathyroidism of renal origin: Secondary | ICD-10-CM | POA: Diagnosis not present

## 2015-11-25 DIAGNOSIS — N186 End stage renal disease: Secondary | ICD-10-CM | POA: Diagnosis not present

## 2015-11-28 DIAGNOSIS — D509 Iron deficiency anemia, unspecified: Secondary | ICD-10-CM | POA: Diagnosis not present

## 2015-11-28 DIAGNOSIS — N186 End stage renal disease: Secondary | ICD-10-CM | POA: Diagnosis not present

## 2015-11-28 DIAGNOSIS — N2581 Secondary hyperparathyroidism of renal origin: Secondary | ICD-10-CM | POA: Diagnosis not present

## 2015-11-30 DIAGNOSIS — N186 End stage renal disease: Secondary | ICD-10-CM | POA: Diagnosis not present

## 2015-11-30 DIAGNOSIS — N2581 Secondary hyperparathyroidism of renal origin: Secondary | ICD-10-CM | POA: Diagnosis not present

## 2015-11-30 DIAGNOSIS — D509 Iron deficiency anemia, unspecified: Secondary | ICD-10-CM | POA: Diagnosis not present

## 2015-12-02 ENCOUNTER — Other Ambulatory Visit: Payer: Self-pay | Admitting: Infectious Disease

## 2015-12-02 DIAGNOSIS — N186 End stage renal disease: Secondary | ICD-10-CM | POA: Diagnosis not present

## 2015-12-02 DIAGNOSIS — N2581 Secondary hyperparathyroidism of renal origin: Secondary | ICD-10-CM | POA: Diagnosis not present

## 2015-12-02 DIAGNOSIS — D509 Iron deficiency anemia, unspecified: Secondary | ICD-10-CM | POA: Diagnosis not present

## 2015-12-05 DIAGNOSIS — N2581 Secondary hyperparathyroidism of renal origin: Secondary | ICD-10-CM | POA: Diagnosis not present

## 2015-12-05 DIAGNOSIS — N186 End stage renal disease: Secondary | ICD-10-CM | POA: Diagnosis not present

## 2015-12-05 DIAGNOSIS — D509 Iron deficiency anemia, unspecified: Secondary | ICD-10-CM | POA: Diagnosis not present

## 2015-12-07 DIAGNOSIS — N186 End stage renal disease: Secondary | ICD-10-CM | POA: Diagnosis not present

## 2015-12-07 DIAGNOSIS — D509 Iron deficiency anemia, unspecified: Secondary | ICD-10-CM | POA: Diagnosis not present

## 2015-12-07 DIAGNOSIS — N2581 Secondary hyperparathyroidism of renal origin: Secondary | ICD-10-CM | POA: Diagnosis not present

## 2015-12-09 DIAGNOSIS — N2581 Secondary hyperparathyroidism of renal origin: Secondary | ICD-10-CM | POA: Diagnosis not present

## 2015-12-09 DIAGNOSIS — D509 Iron deficiency anemia, unspecified: Secondary | ICD-10-CM | POA: Diagnosis not present

## 2015-12-09 DIAGNOSIS — N186 End stage renal disease: Secondary | ICD-10-CM | POA: Diagnosis not present

## 2015-12-12 DIAGNOSIS — D509 Iron deficiency anemia, unspecified: Secondary | ICD-10-CM | POA: Diagnosis not present

## 2015-12-12 DIAGNOSIS — N2581 Secondary hyperparathyroidism of renal origin: Secondary | ICD-10-CM | POA: Diagnosis not present

## 2015-12-12 DIAGNOSIS — N186 End stage renal disease: Secondary | ICD-10-CM | POA: Diagnosis not present

## 2015-12-13 DIAGNOSIS — Z992 Dependence on renal dialysis: Secondary | ICD-10-CM | POA: Diagnosis not present

## 2015-12-13 DIAGNOSIS — N186 End stage renal disease: Secondary | ICD-10-CM | POA: Diagnosis not present

## 2015-12-13 DIAGNOSIS — I120 Hypertensive chronic kidney disease with stage 5 chronic kidney disease or end stage renal disease: Secondary | ICD-10-CM | POA: Diagnosis not present

## 2015-12-14 DIAGNOSIS — N2581 Secondary hyperparathyroidism of renal origin: Secondary | ICD-10-CM | POA: Diagnosis not present

## 2015-12-14 DIAGNOSIS — N186 End stage renal disease: Secondary | ICD-10-CM | POA: Diagnosis not present

## 2015-12-14 DIAGNOSIS — D509 Iron deficiency anemia, unspecified: Secondary | ICD-10-CM | POA: Diagnosis not present

## 2015-12-16 DIAGNOSIS — D509 Iron deficiency anemia, unspecified: Secondary | ICD-10-CM | POA: Diagnosis not present

## 2015-12-16 DIAGNOSIS — N186 End stage renal disease: Secondary | ICD-10-CM | POA: Diagnosis not present

## 2015-12-16 DIAGNOSIS — N2581 Secondary hyperparathyroidism of renal origin: Secondary | ICD-10-CM | POA: Diagnosis not present

## 2015-12-19 DIAGNOSIS — D509 Iron deficiency anemia, unspecified: Secondary | ICD-10-CM | POA: Diagnosis not present

## 2015-12-19 DIAGNOSIS — N186 End stage renal disease: Secondary | ICD-10-CM | POA: Diagnosis not present

## 2015-12-19 DIAGNOSIS — N2581 Secondary hyperparathyroidism of renal origin: Secondary | ICD-10-CM | POA: Diagnosis not present

## 2015-12-21 DIAGNOSIS — N186 End stage renal disease: Secondary | ICD-10-CM | POA: Diagnosis not present

## 2015-12-21 DIAGNOSIS — N2581 Secondary hyperparathyroidism of renal origin: Secondary | ICD-10-CM | POA: Diagnosis not present

## 2015-12-21 DIAGNOSIS — D509 Iron deficiency anemia, unspecified: Secondary | ICD-10-CM | POA: Diagnosis not present

## 2015-12-23 DIAGNOSIS — N186 End stage renal disease: Secondary | ICD-10-CM | POA: Diagnosis not present

## 2015-12-23 DIAGNOSIS — N2581 Secondary hyperparathyroidism of renal origin: Secondary | ICD-10-CM | POA: Diagnosis not present

## 2015-12-23 DIAGNOSIS — D509 Iron deficiency anemia, unspecified: Secondary | ICD-10-CM | POA: Diagnosis not present

## 2015-12-26 DIAGNOSIS — N2581 Secondary hyperparathyroidism of renal origin: Secondary | ICD-10-CM | POA: Diagnosis not present

## 2015-12-26 DIAGNOSIS — N186 End stage renal disease: Secondary | ICD-10-CM | POA: Diagnosis not present

## 2015-12-26 DIAGNOSIS — D509 Iron deficiency anemia, unspecified: Secondary | ICD-10-CM | POA: Diagnosis not present

## 2015-12-28 DIAGNOSIS — D509 Iron deficiency anemia, unspecified: Secondary | ICD-10-CM | POA: Diagnosis not present

## 2015-12-28 DIAGNOSIS — N2581 Secondary hyperparathyroidism of renal origin: Secondary | ICD-10-CM | POA: Diagnosis not present

## 2015-12-28 DIAGNOSIS — N186 End stage renal disease: Secondary | ICD-10-CM | POA: Diagnosis not present

## 2015-12-30 DIAGNOSIS — D509 Iron deficiency anemia, unspecified: Secondary | ICD-10-CM | POA: Diagnosis not present

## 2015-12-30 DIAGNOSIS — N186 End stage renal disease: Secondary | ICD-10-CM | POA: Diagnosis not present

## 2015-12-30 DIAGNOSIS — N2581 Secondary hyperparathyroidism of renal origin: Secondary | ICD-10-CM | POA: Diagnosis not present

## 2016-01-02 DIAGNOSIS — N2581 Secondary hyperparathyroidism of renal origin: Secondary | ICD-10-CM | POA: Diagnosis not present

## 2016-01-02 DIAGNOSIS — N186 End stage renal disease: Secondary | ICD-10-CM | POA: Diagnosis not present

## 2016-01-02 DIAGNOSIS — D509 Iron deficiency anemia, unspecified: Secondary | ICD-10-CM | POA: Diagnosis not present

## 2016-01-04 DIAGNOSIS — N2581 Secondary hyperparathyroidism of renal origin: Secondary | ICD-10-CM | POA: Diagnosis not present

## 2016-01-04 DIAGNOSIS — D509 Iron deficiency anemia, unspecified: Secondary | ICD-10-CM | POA: Diagnosis not present

## 2016-01-04 DIAGNOSIS — N186 End stage renal disease: Secondary | ICD-10-CM | POA: Diagnosis not present

## 2016-01-06 DIAGNOSIS — N2581 Secondary hyperparathyroidism of renal origin: Secondary | ICD-10-CM | POA: Diagnosis not present

## 2016-01-06 DIAGNOSIS — N186 End stage renal disease: Secondary | ICD-10-CM | POA: Diagnosis not present

## 2016-01-06 DIAGNOSIS — D509 Iron deficiency anemia, unspecified: Secondary | ICD-10-CM | POA: Diagnosis not present

## 2016-01-09 DIAGNOSIS — N2581 Secondary hyperparathyroidism of renal origin: Secondary | ICD-10-CM | POA: Diagnosis not present

## 2016-01-09 DIAGNOSIS — D509 Iron deficiency anemia, unspecified: Secondary | ICD-10-CM | POA: Diagnosis not present

## 2016-01-09 DIAGNOSIS — N186 End stage renal disease: Secondary | ICD-10-CM | POA: Diagnosis not present

## 2016-01-10 DIAGNOSIS — N186 End stage renal disease: Secondary | ICD-10-CM | POA: Diagnosis not present

## 2016-01-10 DIAGNOSIS — I120 Hypertensive chronic kidney disease with stage 5 chronic kidney disease or end stage renal disease: Secondary | ICD-10-CM | POA: Diagnosis not present

## 2016-01-10 DIAGNOSIS — Z992 Dependence on renal dialysis: Secondary | ICD-10-CM | POA: Diagnosis not present

## 2016-01-11 DIAGNOSIS — N186 End stage renal disease: Secondary | ICD-10-CM | POA: Diagnosis not present

## 2016-01-11 DIAGNOSIS — N2581 Secondary hyperparathyroidism of renal origin: Secondary | ICD-10-CM | POA: Diagnosis not present

## 2016-01-11 DIAGNOSIS — D509 Iron deficiency anemia, unspecified: Secondary | ICD-10-CM | POA: Diagnosis not present

## 2016-01-13 DIAGNOSIS — N2581 Secondary hyperparathyroidism of renal origin: Secondary | ICD-10-CM | POA: Diagnosis not present

## 2016-01-13 DIAGNOSIS — D509 Iron deficiency anemia, unspecified: Secondary | ICD-10-CM | POA: Diagnosis not present

## 2016-01-13 DIAGNOSIS — N186 End stage renal disease: Secondary | ICD-10-CM | POA: Diagnosis not present

## 2016-01-14 ENCOUNTER — Other Ambulatory Visit: Payer: Self-pay | Admitting: Infectious Disease

## 2016-01-16 ENCOUNTER — Other Ambulatory Visit: Payer: Self-pay | Admitting: *Deleted

## 2016-01-16 DIAGNOSIS — N2581 Secondary hyperparathyroidism of renal origin: Secondary | ICD-10-CM | POA: Diagnosis not present

## 2016-01-16 DIAGNOSIS — N186 End stage renal disease: Secondary | ICD-10-CM | POA: Diagnosis not present

## 2016-01-16 DIAGNOSIS — D509 Iron deficiency anemia, unspecified: Secondary | ICD-10-CM | POA: Diagnosis not present

## 2016-01-16 DIAGNOSIS — B20 Human immunodeficiency virus [HIV] disease: Secondary | ICD-10-CM

## 2016-01-16 MED ORDER — LAMIVUDINE 10 MG/ML PO SOLN: ORAL | Status: DC

## 2016-01-16 MED ORDER — DOLUTEGRAVIR SODIUM 50 MG PO TABS: 50.0000 mg | ORAL_TABLET | Freq: Every day | ORAL | Status: DC

## 2016-01-18 DIAGNOSIS — D509 Iron deficiency anemia, unspecified: Secondary | ICD-10-CM | POA: Diagnosis not present

## 2016-01-18 DIAGNOSIS — N2581 Secondary hyperparathyroidism of renal origin: Secondary | ICD-10-CM | POA: Diagnosis not present

## 2016-01-18 DIAGNOSIS — N186 End stage renal disease: Secondary | ICD-10-CM | POA: Diagnosis not present

## 2016-01-20 DIAGNOSIS — D509 Iron deficiency anemia, unspecified: Secondary | ICD-10-CM | POA: Diagnosis not present

## 2016-01-20 DIAGNOSIS — N186 End stage renal disease: Secondary | ICD-10-CM | POA: Diagnosis not present

## 2016-01-20 DIAGNOSIS — N2581 Secondary hyperparathyroidism of renal origin: Secondary | ICD-10-CM | POA: Diagnosis not present

## 2016-01-23 DIAGNOSIS — N186 End stage renal disease: Secondary | ICD-10-CM | POA: Diagnosis not present

## 2016-01-23 DIAGNOSIS — D509 Iron deficiency anemia, unspecified: Secondary | ICD-10-CM | POA: Diagnosis not present

## 2016-01-23 DIAGNOSIS — N2581 Secondary hyperparathyroidism of renal origin: Secondary | ICD-10-CM | POA: Diagnosis not present

## 2016-01-25 DIAGNOSIS — N2581 Secondary hyperparathyroidism of renal origin: Secondary | ICD-10-CM | POA: Diagnosis not present

## 2016-01-25 DIAGNOSIS — N186 End stage renal disease: Secondary | ICD-10-CM | POA: Diagnosis not present

## 2016-01-25 DIAGNOSIS — D509 Iron deficiency anemia, unspecified: Secondary | ICD-10-CM | POA: Diagnosis not present

## 2016-01-27 DIAGNOSIS — N2581 Secondary hyperparathyroidism of renal origin: Secondary | ICD-10-CM | POA: Diagnosis not present

## 2016-01-27 DIAGNOSIS — N186 End stage renal disease: Secondary | ICD-10-CM | POA: Diagnosis not present

## 2016-01-27 DIAGNOSIS — D509 Iron deficiency anemia, unspecified: Secondary | ICD-10-CM | POA: Diagnosis not present

## 2016-01-30 DIAGNOSIS — N2581 Secondary hyperparathyroidism of renal origin: Secondary | ICD-10-CM | POA: Diagnosis not present

## 2016-01-30 DIAGNOSIS — N186 End stage renal disease: Secondary | ICD-10-CM | POA: Diagnosis not present

## 2016-01-30 DIAGNOSIS — D509 Iron deficiency anemia, unspecified: Secondary | ICD-10-CM | POA: Diagnosis not present

## 2016-02-01 DIAGNOSIS — N2581 Secondary hyperparathyroidism of renal origin: Secondary | ICD-10-CM | POA: Diagnosis not present

## 2016-02-01 DIAGNOSIS — N186 End stage renal disease: Secondary | ICD-10-CM | POA: Diagnosis not present

## 2016-02-01 DIAGNOSIS — D509 Iron deficiency anemia, unspecified: Secondary | ICD-10-CM | POA: Diagnosis not present

## 2016-02-03 DIAGNOSIS — N2581 Secondary hyperparathyroidism of renal origin: Secondary | ICD-10-CM | POA: Diagnosis not present

## 2016-02-03 DIAGNOSIS — N186 End stage renal disease: Secondary | ICD-10-CM | POA: Diagnosis not present

## 2016-02-03 DIAGNOSIS — D509 Iron deficiency anemia, unspecified: Secondary | ICD-10-CM | POA: Diagnosis not present

## 2016-02-06 DIAGNOSIS — N2581 Secondary hyperparathyroidism of renal origin: Secondary | ICD-10-CM | POA: Diagnosis not present

## 2016-02-06 DIAGNOSIS — D509 Iron deficiency anemia, unspecified: Secondary | ICD-10-CM | POA: Diagnosis not present

## 2016-02-06 DIAGNOSIS — N186 End stage renal disease: Secondary | ICD-10-CM | POA: Diagnosis not present

## 2016-02-08 DIAGNOSIS — N186 End stage renal disease: Secondary | ICD-10-CM | POA: Diagnosis not present

## 2016-02-08 DIAGNOSIS — N2581 Secondary hyperparathyroidism of renal origin: Secondary | ICD-10-CM | POA: Diagnosis not present

## 2016-02-08 DIAGNOSIS — D509 Iron deficiency anemia, unspecified: Secondary | ICD-10-CM | POA: Diagnosis not present

## 2016-02-10 DIAGNOSIS — N2581 Secondary hyperparathyroidism of renal origin: Secondary | ICD-10-CM | POA: Diagnosis not present

## 2016-02-10 DIAGNOSIS — N186 End stage renal disease: Secondary | ICD-10-CM | POA: Diagnosis not present

## 2016-02-10 DIAGNOSIS — Z992 Dependence on renal dialysis: Secondary | ICD-10-CM | POA: Diagnosis not present

## 2016-02-10 DIAGNOSIS — D509 Iron deficiency anemia, unspecified: Secondary | ICD-10-CM | POA: Diagnosis not present

## 2016-02-10 DIAGNOSIS — I120 Hypertensive chronic kidney disease with stage 5 chronic kidney disease or end stage renal disease: Secondary | ICD-10-CM | POA: Diagnosis not present

## 2016-02-13 DIAGNOSIS — N2581 Secondary hyperparathyroidism of renal origin: Secondary | ICD-10-CM | POA: Diagnosis not present

## 2016-02-13 DIAGNOSIS — D509 Iron deficiency anemia, unspecified: Secondary | ICD-10-CM | POA: Diagnosis not present

## 2016-02-13 DIAGNOSIS — N186 End stage renal disease: Secondary | ICD-10-CM | POA: Diagnosis not present

## 2016-02-15 DIAGNOSIS — D509 Iron deficiency anemia, unspecified: Secondary | ICD-10-CM | POA: Diagnosis not present

## 2016-02-15 DIAGNOSIS — N186 End stage renal disease: Secondary | ICD-10-CM | POA: Diagnosis not present

## 2016-02-15 DIAGNOSIS — N2581 Secondary hyperparathyroidism of renal origin: Secondary | ICD-10-CM | POA: Diagnosis not present

## 2016-02-17 DIAGNOSIS — N2581 Secondary hyperparathyroidism of renal origin: Secondary | ICD-10-CM | POA: Diagnosis not present

## 2016-02-17 DIAGNOSIS — D509 Iron deficiency anemia, unspecified: Secondary | ICD-10-CM | POA: Diagnosis not present

## 2016-02-17 DIAGNOSIS — N186 End stage renal disease: Secondary | ICD-10-CM | POA: Diagnosis not present

## 2016-02-20 DIAGNOSIS — N186 End stage renal disease: Secondary | ICD-10-CM | POA: Diagnosis not present

## 2016-02-20 DIAGNOSIS — N2581 Secondary hyperparathyroidism of renal origin: Secondary | ICD-10-CM | POA: Diagnosis not present

## 2016-02-20 DIAGNOSIS — D509 Iron deficiency anemia, unspecified: Secondary | ICD-10-CM | POA: Diagnosis not present

## 2016-02-22 DIAGNOSIS — N186 End stage renal disease: Secondary | ICD-10-CM | POA: Diagnosis not present

## 2016-02-22 DIAGNOSIS — D509 Iron deficiency anemia, unspecified: Secondary | ICD-10-CM | POA: Diagnosis not present

## 2016-02-22 DIAGNOSIS — N2581 Secondary hyperparathyroidism of renal origin: Secondary | ICD-10-CM | POA: Diagnosis not present

## 2016-02-24 DIAGNOSIS — N186 End stage renal disease: Secondary | ICD-10-CM | POA: Diagnosis not present

## 2016-02-24 DIAGNOSIS — D509 Iron deficiency anemia, unspecified: Secondary | ICD-10-CM | POA: Diagnosis not present

## 2016-02-24 DIAGNOSIS — N2581 Secondary hyperparathyroidism of renal origin: Secondary | ICD-10-CM | POA: Diagnosis not present

## 2016-02-27 DIAGNOSIS — N2581 Secondary hyperparathyroidism of renal origin: Secondary | ICD-10-CM | POA: Diagnosis not present

## 2016-02-27 DIAGNOSIS — N186 End stage renal disease: Secondary | ICD-10-CM | POA: Diagnosis not present

## 2016-02-27 DIAGNOSIS — D509 Iron deficiency anemia, unspecified: Secondary | ICD-10-CM | POA: Diagnosis not present

## 2016-02-29 DIAGNOSIS — D509 Iron deficiency anemia, unspecified: Secondary | ICD-10-CM | POA: Diagnosis not present

## 2016-02-29 DIAGNOSIS — N186 End stage renal disease: Secondary | ICD-10-CM | POA: Diagnosis not present

## 2016-02-29 DIAGNOSIS — N2581 Secondary hyperparathyroidism of renal origin: Secondary | ICD-10-CM | POA: Diagnosis not present

## 2016-03-01 DIAGNOSIS — N2581 Secondary hyperparathyroidism of renal origin: Secondary | ICD-10-CM | POA: Diagnosis not present

## 2016-03-01 DIAGNOSIS — D509 Iron deficiency anemia, unspecified: Secondary | ICD-10-CM | POA: Diagnosis not present

## 2016-03-01 DIAGNOSIS — N186 End stage renal disease: Secondary | ICD-10-CM | POA: Diagnosis not present

## 2016-03-05 DIAGNOSIS — N186 End stage renal disease: Secondary | ICD-10-CM | POA: Diagnosis not present

## 2016-03-05 DIAGNOSIS — D509 Iron deficiency anemia, unspecified: Secondary | ICD-10-CM | POA: Diagnosis not present

## 2016-03-05 DIAGNOSIS — N2581 Secondary hyperparathyroidism of renal origin: Secondary | ICD-10-CM | POA: Diagnosis not present

## 2016-03-07 DIAGNOSIS — N2581 Secondary hyperparathyroidism of renal origin: Secondary | ICD-10-CM | POA: Diagnosis not present

## 2016-03-07 DIAGNOSIS — N186 End stage renal disease: Secondary | ICD-10-CM | POA: Diagnosis not present

## 2016-03-07 DIAGNOSIS — D509 Iron deficiency anemia, unspecified: Secondary | ICD-10-CM | POA: Diagnosis not present

## 2016-03-09 DIAGNOSIS — N2581 Secondary hyperparathyroidism of renal origin: Secondary | ICD-10-CM | POA: Diagnosis not present

## 2016-03-09 DIAGNOSIS — D509 Iron deficiency anemia, unspecified: Secondary | ICD-10-CM | POA: Diagnosis not present

## 2016-03-09 DIAGNOSIS — N186 End stage renal disease: Secondary | ICD-10-CM | POA: Diagnosis not present

## 2016-03-11 DIAGNOSIS — I120 Hypertensive chronic kidney disease with stage 5 chronic kidney disease or end stage renal disease: Secondary | ICD-10-CM | POA: Diagnosis not present

## 2016-03-11 DIAGNOSIS — Z992 Dependence on renal dialysis: Secondary | ICD-10-CM | POA: Diagnosis not present

## 2016-03-11 DIAGNOSIS — N186 End stage renal disease: Secondary | ICD-10-CM | POA: Diagnosis not present

## 2016-03-12 DIAGNOSIS — D509 Iron deficiency anemia, unspecified: Secondary | ICD-10-CM | POA: Diagnosis not present

## 2016-03-12 DIAGNOSIS — N186 End stage renal disease: Secondary | ICD-10-CM | POA: Diagnosis not present

## 2016-03-12 DIAGNOSIS — N2581 Secondary hyperparathyroidism of renal origin: Secondary | ICD-10-CM | POA: Diagnosis not present

## 2016-03-14 DIAGNOSIS — N186 End stage renal disease: Secondary | ICD-10-CM | POA: Diagnosis not present

## 2016-03-14 DIAGNOSIS — D509 Iron deficiency anemia, unspecified: Secondary | ICD-10-CM | POA: Diagnosis not present

## 2016-03-14 DIAGNOSIS — N2581 Secondary hyperparathyroidism of renal origin: Secondary | ICD-10-CM | POA: Diagnosis not present

## 2016-03-16 DIAGNOSIS — D509 Iron deficiency anemia, unspecified: Secondary | ICD-10-CM | POA: Diagnosis not present

## 2016-03-16 DIAGNOSIS — N186 End stage renal disease: Secondary | ICD-10-CM | POA: Diagnosis not present

## 2016-03-16 DIAGNOSIS — N2581 Secondary hyperparathyroidism of renal origin: Secondary | ICD-10-CM | POA: Diagnosis not present

## 2016-03-19 DIAGNOSIS — N186 End stage renal disease: Secondary | ICD-10-CM | POA: Diagnosis not present

## 2016-03-19 DIAGNOSIS — D509 Iron deficiency anemia, unspecified: Secondary | ICD-10-CM | POA: Diagnosis not present

## 2016-03-19 DIAGNOSIS — N2581 Secondary hyperparathyroidism of renal origin: Secondary | ICD-10-CM | POA: Diagnosis not present

## 2016-03-21 DIAGNOSIS — N186 End stage renal disease: Secondary | ICD-10-CM | POA: Diagnosis not present

## 2016-03-21 DIAGNOSIS — N2581 Secondary hyperparathyroidism of renal origin: Secondary | ICD-10-CM | POA: Diagnosis not present

## 2016-03-21 DIAGNOSIS — D509 Iron deficiency anemia, unspecified: Secondary | ICD-10-CM | POA: Diagnosis not present

## 2016-03-23 DIAGNOSIS — D509 Iron deficiency anemia, unspecified: Secondary | ICD-10-CM | POA: Diagnosis not present

## 2016-03-23 DIAGNOSIS — N186 End stage renal disease: Secondary | ICD-10-CM | POA: Diagnosis not present

## 2016-03-23 DIAGNOSIS — N2581 Secondary hyperparathyroidism of renal origin: Secondary | ICD-10-CM | POA: Diagnosis not present

## 2016-03-26 DIAGNOSIS — D509 Iron deficiency anemia, unspecified: Secondary | ICD-10-CM | POA: Diagnosis not present

## 2016-03-26 DIAGNOSIS — N186 End stage renal disease: Secondary | ICD-10-CM | POA: Diagnosis not present

## 2016-03-26 DIAGNOSIS — N2581 Secondary hyperparathyroidism of renal origin: Secondary | ICD-10-CM | POA: Diagnosis not present

## 2016-03-28 DIAGNOSIS — D509 Iron deficiency anemia, unspecified: Secondary | ICD-10-CM | POA: Diagnosis not present

## 2016-03-28 DIAGNOSIS — N2581 Secondary hyperparathyroidism of renal origin: Secondary | ICD-10-CM | POA: Diagnosis not present

## 2016-03-28 DIAGNOSIS — N186 End stage renal disease: Secondary | ICD-10-CM | POA: Diagnosis not present

## 2016-03-30 DIAGNOSIS — D509 Iron deficiency anemia, unspecified: Secondary | ICD-10-CM | POA: Diagnosis not present

## 2016-03-30 DIAGNOSIS — N2581 Secondary hyperparathyroidism of renal origin: Secondary | ICD-10-CM | POA: Diagnosis not present

## 2016-03-30 DIAGNOSIS — N186 End stage renal disease: Secondary | ICD-10-CM | POA: Diagnosis not present

## 2016-04-02 DIAGNOSIS — N2581 Secondary hyperparathyroidism of renal origin: Secondary | ICD-10-CM | POA: Diagnosis not present

## 2016-04-02 DIAGNOSIS — D509 Iron deficiency anemia, unspecified: Secondary | ICD-10-CM | POA: Diagnosis not present

## 2016-04-02 DIAGNOSIS — N186 End stage renal disease: Secondary | ICD-10-CM | POA: Diagnosis not present

## 2016-04-04 DIAGNOSIS — N2581 Secondary hyperparathyroidism of renal origin: Secondary | ICD-10-CM | POA: Diagnosis not present

## 2016-04-04 DIAGNOSIS — D509 Iron deficiency anemia, unspecified: Secondary | ICD-10-CM | POA: Diagnosis not present

## 2016-04-04 DIAGNOSIS — N186 End stage renal disease: Secondary | ICD-10-CM | POA: Diagnosis not present

## 2016-04-06 DIAGNOSIS — D509 Iron deficiency anemia, unspecified: Secondary | ICD-10-CM | POA: Diagnosis not present

## 2016-04-06 DIAGNOSIS — N2581 Secondary hyperparathyroidism of renal origin: Secondary | ICD-10-CM | POA: Diagnosis not present

## 2016-04-06 DIAGNOSIS — N186 End stage renal disease: Secondary | ICD-10-CM | POA: Diagnosis not present

## 2016-04-09 DIAGNOSIS — N186 End stage renal disease: Secondary | ICD-10-CM | POA: Diagnosis not present

## 2016-04-09 DIAGNOSIS — D509 Iron deficiency anemia, unspecified: Secondary | ICD-10-CM | POA: Diagnosis not present

## 2016-04-09 DIAGNOSIS — N2581 Secondary hyperparathyroidism of renal origin: Secondary | ICD-10-CM | POA: Diagnosis not present

## 2016-04-11 DIAGNOSIS — N2581 Secondary hyperparathyroidism of renal origin: Secondary | ICD-10-CM | POA: Diagnosis not present

## 2016-04-11 DIAGNOSIS — I120 Hypertensive chronic kidney disease with stage 5 chronic kidney disease or end stage renal disease: Secondary | ICD-10-CM | POA: Diagnosis not present

## 2016-04-11 DIAGNOSIS — D509 Iron deficiency anemia, unspecified: Secondary | ICD-10-CM | POA: Diagnosis not present

## 2016-04-11 DIAGNOSIS — Z992 Dependence on renal dialysis: Secondary | ICD-10-CM | POA: Diagnosis not present

## 2016-04-11 DIAGNOSIS — N186 End stage renal disease: Secondary | ICD-10-CM | POA: Diagnosis not present

## 2016-04-13 DIAGNOSIS — N2581 Secondary hyperparathyroidism of renal origin: Secondary | ICD-10-CM | POA: Diagnosis not present

## 2016-04-13 DIAGNOSIS — N186 End stage renal disease: Secondary | ICD-10-CM | POA: Diagnosis not present

## 2016-04-13 DIAGNOSIS — D509 Iron deficiency anemia, unspecified: Secondary | ICD-10-CM | POA: Diagnosis not present

## 2016-04-16 DIAGNOSIS — N2581 Secondary hyperparathyroidism of renal origin: Secondary | ICD-10-CM | POA: Diagnosis not present

## 2016-04-16 DIAGNOSIS — N186 End stage renal disease: Secondary | ICD-10-CM | POA: Diagnosis not present

## 2016-04-16 DIAGNOSIS — D509 Iron deficiency anemia, unspecified: Secondary | ICD-10-CM | POA: Diagnosis not present

## 2016-04-18 DIAGNOSIS — N2581 Secondary hyperparathyroidism of renal origin: Secondary | ICD-10-CM | POA: Diagnosis not present

## 2016-04-18 DIAGNOSIS — N186 End stage renal disease: Secondary | ICD-10-CM | POA: Diagnosis not present

## 2016-04-18 DIAGNOSIS — D509 Iron deficiency anemia, unspecified: Secondary | ICD-10-CM | POA: Diagnosis not present

## 2016-04-20 DIAGNOSIS — N2581 Secondary hyperparathyroidism of renal origin: Secondary | ICD-10-CM | POA: Diagnosis not present

## 2016-04-20 DIAGNOSIS — N186 End stage renal disease: Secondary | ICD-10-CM | POA: Diagnosis not present

## 2016-04-20 DIAGNOSIS — D509 Iron deficiency anemia, unspecified: Secondary | ICD-10-CM | POA: Diagnosis not present

## 2016-04-23 DIAGNOSIS — N186 End stage renal disease: Secondary | ICD-10-CM | POA: Diagnosis not present

## 2016-04-23 DIAGNOSIS — D509 Iron deficiency anemia, unspecified: Secondary | ICD-10-CM | POA: Diagnosis not present

## 2016-04-23 DIAGNOSIS — N2581 Secondary hyperparathyroidism of renal origin: Secondary | ICD-10-CM | POA: Diagnosis not present

## 2016-04-25 DIAGNOSIS — N2581 Secondary hyperparathyroidism of renal origin: Secondary | ICD-10-CM | POA: Diagnosis not present

## 2016-04-25 DIAGNOSIS — N186 End stage renal disease: Secondary | ICD-10-CM | POA: Diagnosis not present

## 2016-04-25 DIAGNOSIS — Z7682 Awaiting organ transplant status: Secondary | ICD-10-CM | POA: Diagnosis not present

## 2016-04-25 DIAGNOSIS — D509 Iron deficiency anemia, unspecified: Secondary | ICD-10-CM | POA: Diagnosis not present

## 2016-04-27 DIAGNOSIS — D509 Iron deficiency anemia, unspecified: Secondary | ICD-10-CM | POA: Diagnosis not present

## 2016-04-27 DIAGNOSIS — N186 End stage renal disease: Secondary | ICD-10-CM | POA: Diagnosis not present

## 2016-04-27 DIAGNOSIS — N2581 Secondary hyperparathyroidism of renal origin: Secondary | ICD-10-CM | POA: Diagnosis not present

## 2016-04-30 DIAGNOSIS — N186 End stage renal disease: Secondary | ICD-10-CM | POA: Diagnosis not present

## 2016-04-30 DIAGNOSIS — D509 Iron deficiency anemia, unspecified: Secondary | ICD-10-CM | POA: Diagnosis not present

## 2016-04-30 DIAGNOSIS — N2581 Secondary hyperparathyroidism of renal origin: Secondary | ICD-10-CM | POA: Diagnosis not present

## 2016-05-02 DIAGNOSIS — N186 End stage renal disease: Secondary | ICD-10-CM | POA: Diagnosis not present

## 2016-05-02 DIAGNOSIS — N2581 Secondary hyperparathyroidism of renal origin: Secondary | ICD-10-CM | POA: Diagnosis not present

## 2016-05-02 DIAGNOSIS — D509 Iron deficiency anemia, unspecified: Secondary | ICD-10-CM | POA: Diagnosis not present

## 2016-05-04 DIAGNOSIS — N186 End stage renal disease: Secondary | ICD-10-CM | POA: Diagnosis not present

## 2016-05-04 DIAGNOSIS — N2581 Secondary hyperparathyroidism of renal origin: Secondary | ICD-10-CM | POA: Diagnosis not present

## 2016-05-04 DIAGNOSIS — D509 Iron deficiency anemia, unspecified: Secondary | ICD-10-CM | POA: Diagnosis not present

## 2016-05-07 DIAGNOSIS — N186 End stage renal disease: Secondary | ICD-10-CM | POA: Diagnosis not present

## 2016-05-07 DIAGNOSIS — D509 Iron deficiency anemia, unspecified: Secondary | ICD-10-CM | POA: Diagnosis not present

## 2016-05-07 DIAGNOSIS — N2581 Secondary hyperparathyroidism of renal origin: Secondary | ICD-10-CM | POA: Diagnosis not present

## 2016-05-09 DIAGNOSIS — N186 End stage renal disease: Secondary | ICD-10-CM | POA: Diagnosis not present

## 2016-05-09 DIAGNOSIS — D509 Iron deficiency anemia, unspecified: Secondary | ICD-10-CM | POA: Diagnosis not present

## 2016-05-09 DIAGNOSIS — N2581 Secondary hyperparathyroidism of renal origin: Secondary | ICD-10-CM | POA: Diagnosis not present

## 2016-05-11 DIAGNOSIS — N2581 Secondary hyperparathyroidism of renal origin: Secondary | ICD-10-CM | POA: Diagnosis not present

## 2016-05-11 DIAGNOSIS — D509 Iron deficiency anemia, unspecified: Secondary | ICD-10-CM | POA: Diagnosis not present

## 2016-05-11 DIAGNOSIS — Z992 Dependence on renal dialysis: Secondary | ICD-10-CM | POA: Diagnosis not present

## 2016-05-11 DIAGNOSIS — N186 End stage renal disease: Secondary | ICD-10-CM | POA: Diagnosis not present

## 2016-05-11 DIAGNOSIS — I120 Hypertensive chronic kidney disease with stage 5 chronic kidney disease or end stage renal disease: Secondary | ICD-10-CM | POA: Diagnosis not present

## 2016-05-14 DIAGNOSIS — N186 End stage renal disease: Secondary | ICD-10-CM | POA: Diagnosis not present

## 2016-05-14 DIAGNOSIS — D509 Iron deficiency anemia, unspecified: Secondary | ICD-10-CM | POA: Diagnosis not present

## 2016-05-14 DIAGNOSIS — Z7682 Awaiting organ transplant status: Secondary | ICD-10-CM | POA: Diagnosis not present

## 2016-05-14 DIAGNOSIS — N2581 Secondary hyperparathyroidism of renal origin: Secondary | ICD-10-CM | POA: Diagnosis not present

## 2016-05-16 DIAGNOSIS — N186 End stage renal disease: Secondary | ICD-10-CM | POA: Diagnosis not present

## 2016-05-16 DIAGNOSIS — D509 Iron deficiency anemia, unspecified: Secondary | ICD-10-CM | POA: Diagnosis not present

## 2016-05-16 DIAGNOSIS — N2581 Secondary hyperparathyroidism of renal origin: Secondary | ICD-10-CM | POA: Diagnosis not present

## 2016-05-18 DIAGNOSIS — N186 End stage renal disease: Secondary | ICD-10-CM | POA: Diagnosis not present

## 2016-05-18 DIAGNOSIS — D509 Iron deficiency anemia, unspecified: Secondary | ICD-10-CM | POA: Diagnosis not present

## 2016-05-18 DIAGNOSIS — N2581 Secondary hyperparathyroidism of renal origin: Secondary | ICD-10-CM | POA: Diagnosis not present

## 2016-05-21 DIAGNOSIS — D509 Iron deficiency anemia, unspecified: Secondary | ICD-10-CM | POA: Diagnosis not present

## 2016-05-21 DIAGNOSIS — N2581 Secondary hyperparathyroidism of renal origin: Secondary | ICD-10-CM | POA: Diagnosis not present

## 2016-05-21 DIAGNOSIS — N186 End stage renal disease: Secondary | ICD-10-CM | POA: Diagnosis not present

## 2016-05-23 DIAGNOSIS — N186 End stage renal disease: Secondary | ICD-10-CM | POA: Diagnosis not present

## 2016-05-23 DIAGNOSIS — N2581 Secondary hyperparathyroidism of renal origin: Secondary | ICD-10-CM | POA: Diagnosis not present

## 2016-05-23 DIAGNOSIS — D509 Iron deficiency anemia, unspecified: Secondary | ICD-10-CM | POA: Diagnosis not present

## 2016-05-25 DIAGNOSIS — D509 Iron deficiency anemia, unspecified: Secondary | ICD-10-CM | POA: Diagnosis not present

## 2016-05-25 DIAGNOSIS — N186 End stage renal disease: Secondary | ICD-10-CM | POA: Diagnosis not present

## 2016-05-25 DIAGNOSIS — N2581 Secondary hyperparathyroidism of renal origin: Secondary | ICD-10-CM | POA: Diagnosis not present

## 2016-05-28 DIAGNOSIS — N2581 Secondary hyperparathyroidism of renal origin: Secondary | ICD-10-CM | POA: Diagnosis not present

## 2016-05-28 DIAGNOSIS — N186 End stage renal disease: Secondary | ICD-10-CM | POA: Diagnosis not present

## 2016-05-28 DIAGNOSIS — D509 Iron deficiency anemia, unspecified: Secondary | ICD-10-CM | POA: Diagnosis not present

## 2016-05-30 DIAGNOSIS — N186 End stage renal disease: Secondary | ICD-10-CM | POA: Diagnosis not present

## 2016-05-30 DIAGNOSIS — N2581 Secondary hyperparathyroidism of renal origin: Secondary | ICD-10-CM | POA: Diagnosis not present

## 2016-05-30 DIAGNOSIS — D509 Iron deficiency anemia, unspecified: Secondary | ICD-10-CM | POA: Diagnosis not present

## 2016-06-01 DIAGNOSIS — D509 Iron deficiency anemia, unspecified: Secondary | ICD-10-CM | POA: Diagnosis not present

## 2016-06-01 DIAGNOSIS — N186 End stage renal disease: Secondary | ICD-10-CM | POA: Diagnosis not present

## 2016-06-01 DIAGNOSIS — N2581 Secondary hyperparathyroidism of renal origin: Secondary | ICD-10-CM | POA: Diagnosis not present

## 2016-06-04 DIAGNOSIS — N186 End stage renal disease: Secondary | ICD-10-CM | POA: Diagnosis not present

## 2016-06-04 DIAGNOSIS — D509 Iron deficiency anemia, unspecified: Secondary | ICD-10-CM | POA: Diagnosis not present

## 2016-06-04 DIAGNOSIS — N2581 Secondary hyperparathyroidism of renal origin: Secondary | ICD-10-CM | POA: Diagnosis not present

## 2016-06-06 DIAGNOSIS — N2581 Secondary hyperparathyroidism of renal origin: Secondary | ICD-10-CM | POA: Diagnosis not present

## 2016-06-06 DIAGNOSIS — N186 End stage renal disease: Secondary | ICD-10-CM | POA: Diagnosis not present

## 2016-06-06 DIAGNOSIS — D509 Iron deficiency anemia, unspecified: Secondary | ICD-10-CM | POA: Diagnosis not present

## 2016-06-08 DIAGNOSIS — N186 End stage renal disease: Secondary | ICD-10-CM | POA: Diagnosis not present

## 2016-06-08 DIAGNOSIS — D509 Iron deficiency anemia, unspecified: Secondary | ICD-10-CM | POA: Diagnosis not present

## 2016-06-08 DIAGNOSIS — N2581 Secondary hyperparathyroidism of renal origin: Secondary | ICD-10-CM | POA: Diagnosis not present

## 2016-06-11 DIAGNOSIS — N2581 Secondary hyperparathyroidism of renal origin: Secondary | ICD-10-CM | POA: Diagnosis not present

## 2016-06-11 DIAGNOSIS — D509 Iron deficiency anemia, unspecified: Secondary | ICD-10-CM | POA: Diagnosis not present

## 2016-06-11 DIAGNOSIS — N186 End stage renal disease: Secondary | ICD-10-CM | POA: Diagnosis not present

## 2016-06-11 DIAGNOSIS — Z992 Dependence on renal dialysis: Secondary | ICD-10-CM | POA: Diagnosis not present

## 2016-06-11 DIAGNOSIS — I120 Hypertensive chronic kidney disease with stage 5 chronic kidney disease or end stage renal disease: Secondary | ICD-10-CM | POA: Diagnosis not present

## 2016-06-13 DIAGNOSIS — N2581 Secondary hyperparathyroidism of renal origin: Secondary | ICD-10-CM | POA: Diagnosis not present

## 2016-06-13 DIAGNOSIS — D509 Iron deficiency anemia, unspecified: Secondary | ICD-10-CM | POA: Diagnosis not present

## 2016-06-13 DIAGNOSIS — N186 End stage renal disease: Secondary | ICD-10-CM | POA: Diagnosis not present

## 2016-06-14 DIAGNOSIS — N2581 Secondary hyperparathyroidism of renal origin: Secondary | ICD-10-CM | POA: Diagnosis not present

## 2016-06-14 DIAGNOSIS — D509 Iron deficiency anemia, unspecified: Secondary | ICD-10-CM | POA: Diagnosis not present

## 2016-06-14 DIAGNOSIS — N186 End stage renal disease: Secondary | ICD-10-CM | POA: Diagnosis not present

## 2016-06-18 DIAGNOSIS — N186 End stage renal disease: Secondary | ICD-10-CM | POA: Diagnosis not present

## 2016-06-18 DIAGNOSIS — N2581 Secondary hyperparathyroidism of renal origin: Secondary | ICD-10-CM | POA: Diagnosis not present

## 2016-06-18 DIAGNOSIS — D509 Iron deficiency anemia, unspecified: Secondary | ICD-10-CM | POA: Diagnosis not present

## 2016-06-20 DIAGNOSIS — N186 End stage renal disease: Secondary | ICD-10-CM | POA: Diagnosis not present

## 2016-06-20 DIAGNOSIS — N2581 Secondary hyperparathyroidism of renal origin: Secondary | ICD-10-CM | POA: Diagnosis not present

## 2016-06-20 DIAGNOSIS — D509 Iron deficiency anemia, unspecified: Secondary | ICD-10-CM | POA: Diagnosis not present

## 2016-06-20 IMAGING — CR DG HIP (WITH OR WITHOUT PELVIS) 2-3V*L*
3 series · 3 of 3 positions shown · non-contrast
Comparison: None.

CLINICAL DATA: Acute left hip pain without known injury.

EXAM:
LEFT HIP - COMPLETE 2+ VIEW

[t pelvis ap]
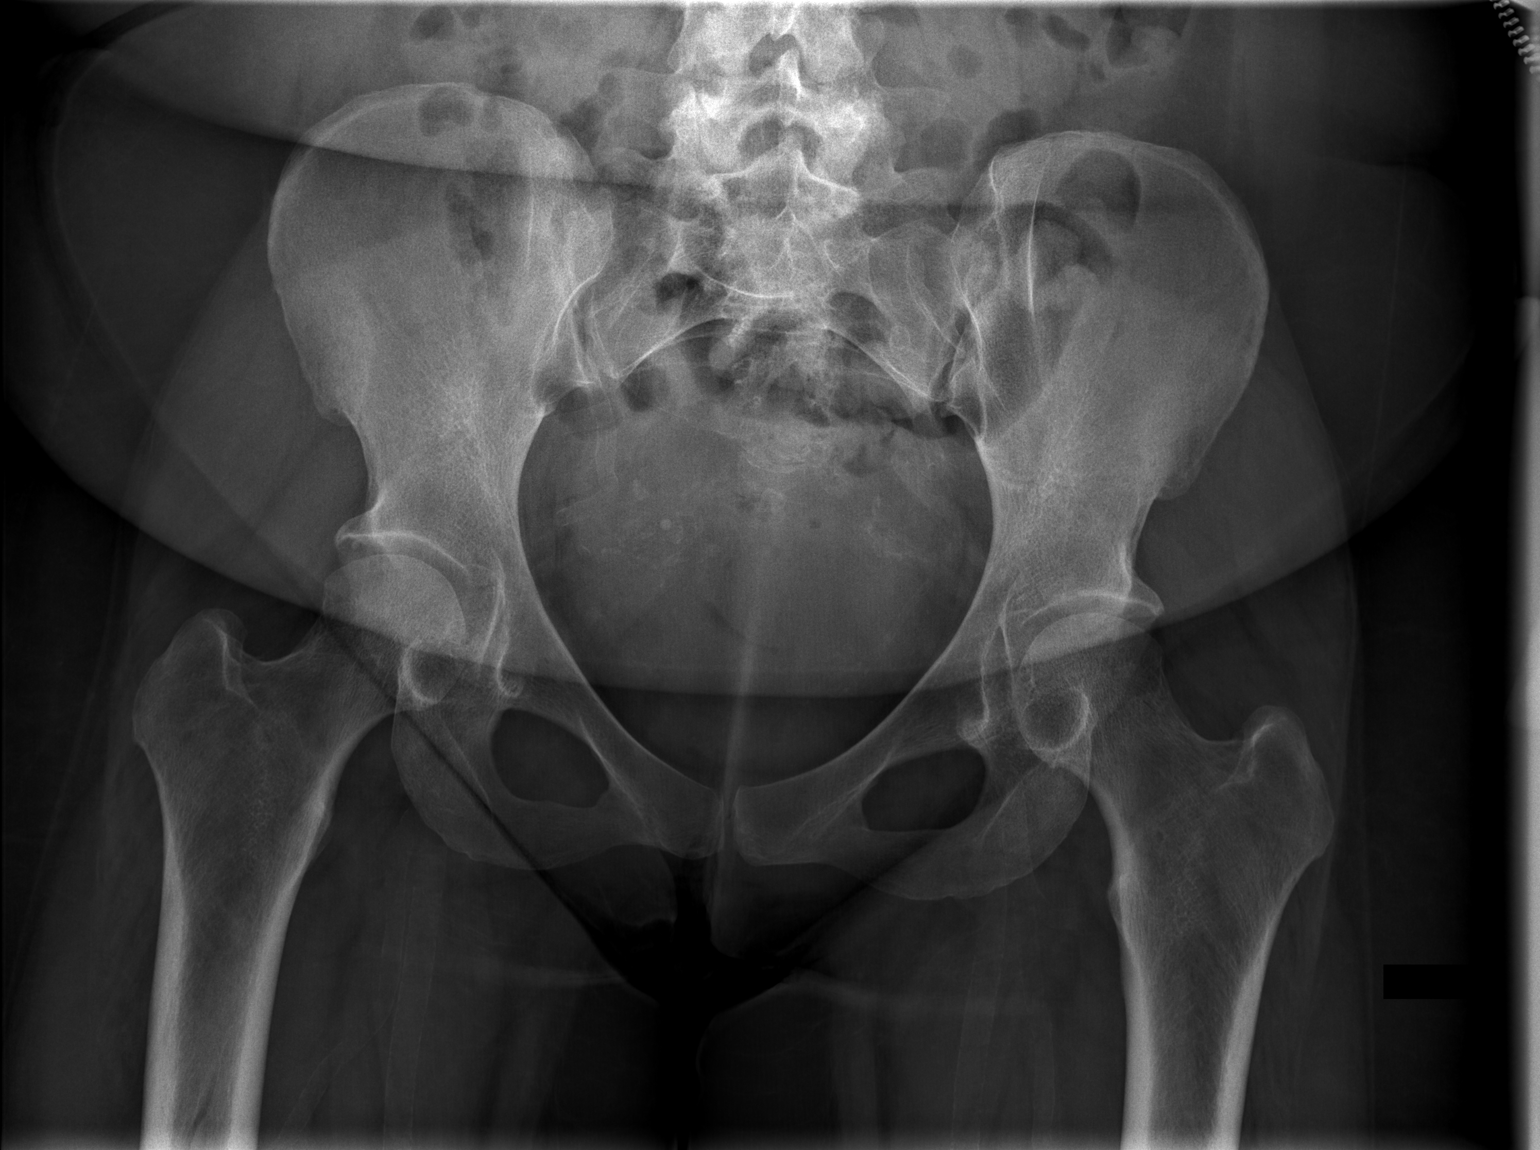

[t hip ap left (1 of 2)]
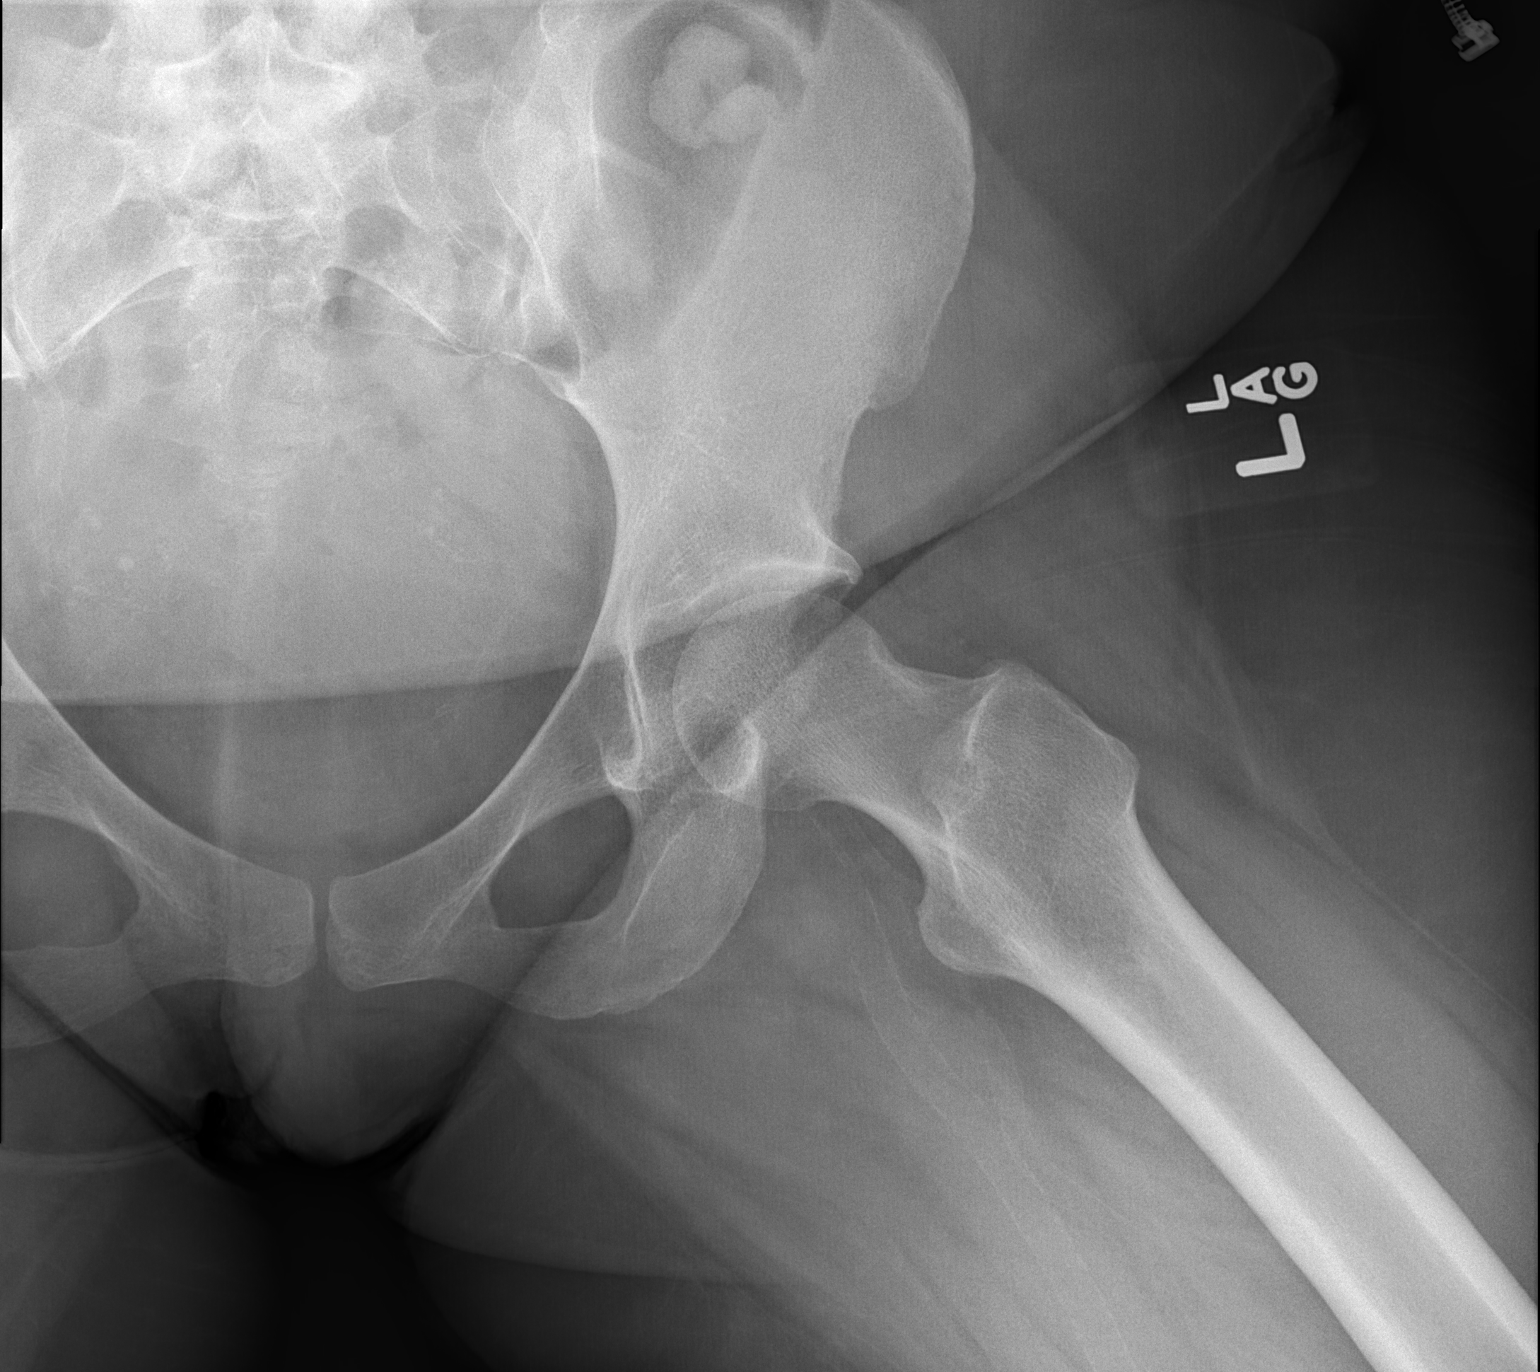

[t hip ap left (2 of 2)]
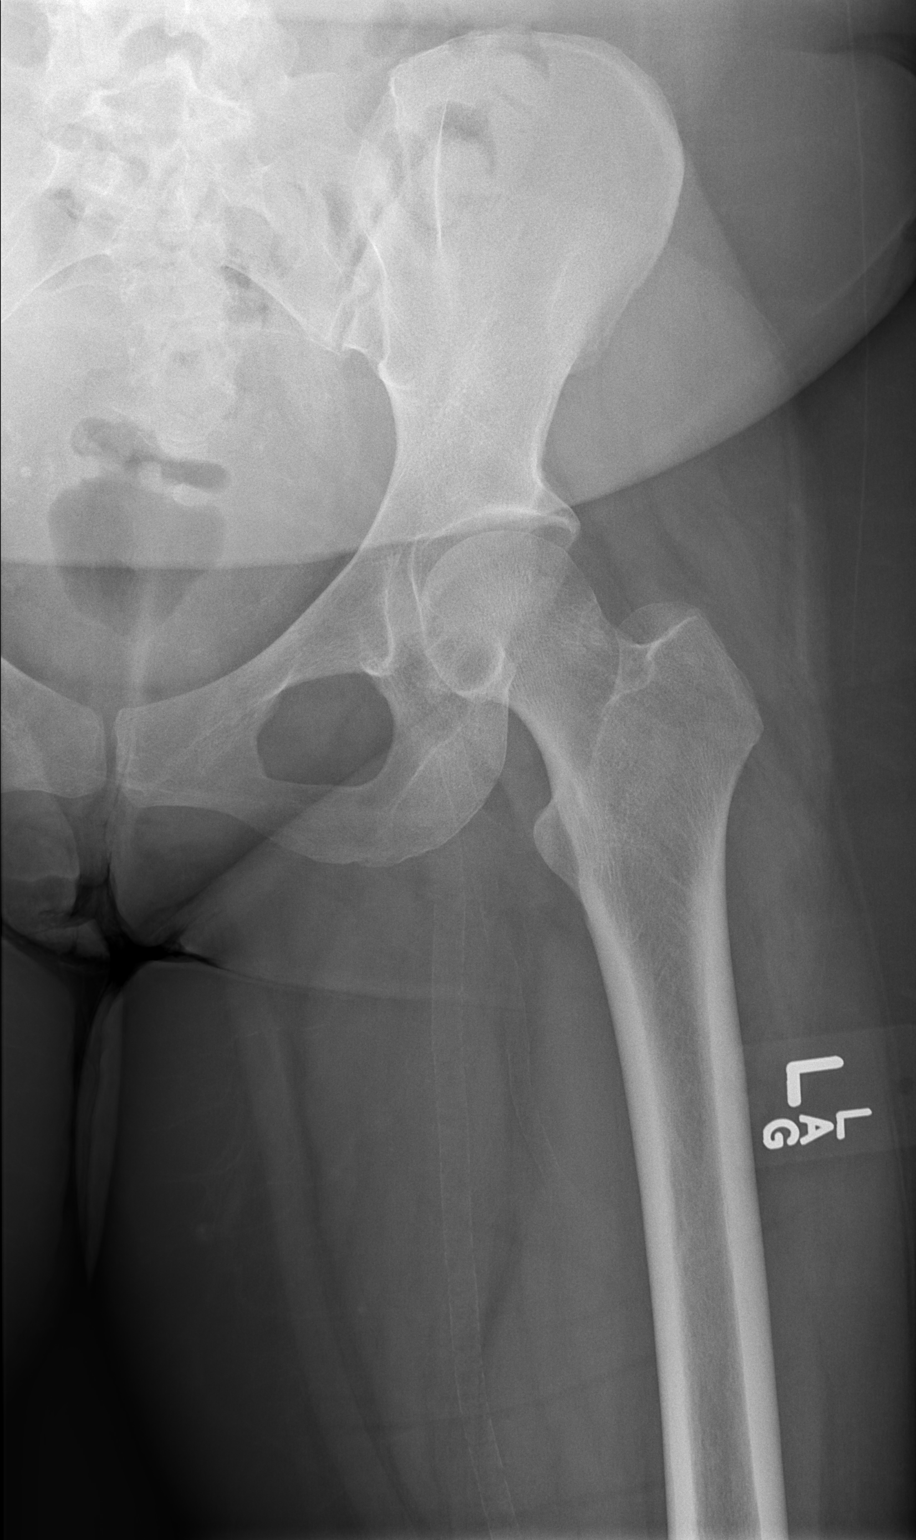

[3 of 3 positions shown; findings below may reference images not displayed]

FINDINGS: There is no evidence of hip fracture or dislocation. There is no
evidence of arthropathy or other focal bone abnormality.
IMPRESSION: Normal left hip.

## 2016-06-22 DIAGNOSIS — N186 End stage renal disease: Secondary | ICD-10-CM | POA: Diagnosis not present

## 2016-06-22 DIAGNOSIS — D509 Iron deficiency anemia, unspecified: Secondary | ICD-10-CM | POA: Diagnosis not present

## 2016-06-22 DIAGNOSIS — N2581 Secondary hyperparathyroidism of renal origin: Secondary | ICD-10-CM | POA: Diagnosis not present

## 2016-06-25 DIAGNOSIS — D509 Iron deficiency anemia, unspecified: Secondary | ICD-10-CM | POA: Diagnosis not present

## 2016-06-25 DIAGNOSIS — N2581 Secondary hyperparathyroidism of renal origin: Secondary | ICD-10-CM | POA: Diagnosis not present

## 2016-06-25 DIAGNOSIS — N186 End stage renal disease: Secondary | ICD-10-CM | POA: Diagnosis not present

## 2016-06-25 IMAGING — CR DG LUMBAR SPINE COMPLETE 4+V
5 series · 5 of 5 positions shown · non-contrast
Comparison: None.

CLINICAL DATA: Low back pain with extension into left tip for 5
days

EXAM:
LUMBAR SPINE - COMPLETE 4+ VIEW

[t lumbar spine ap]
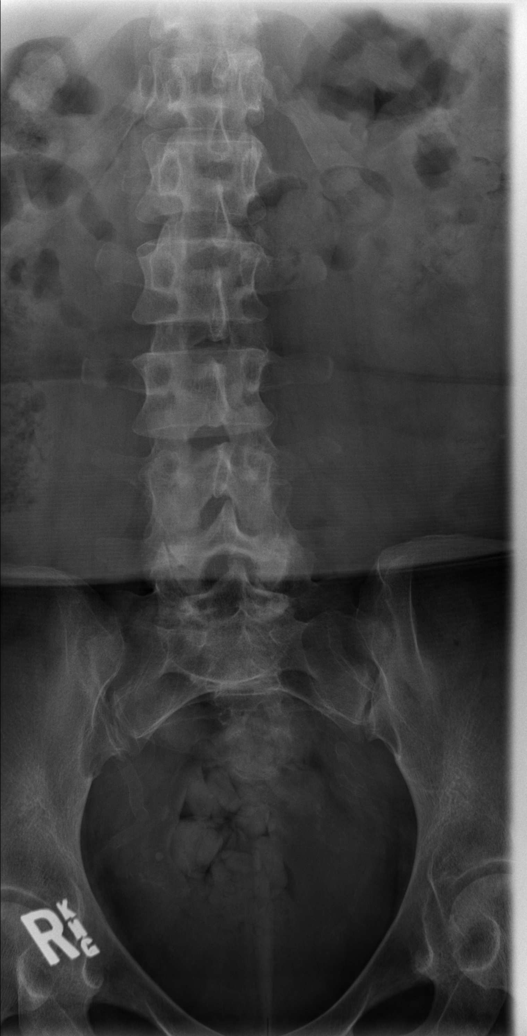

[t lumbar spine obl (1 of 2)]
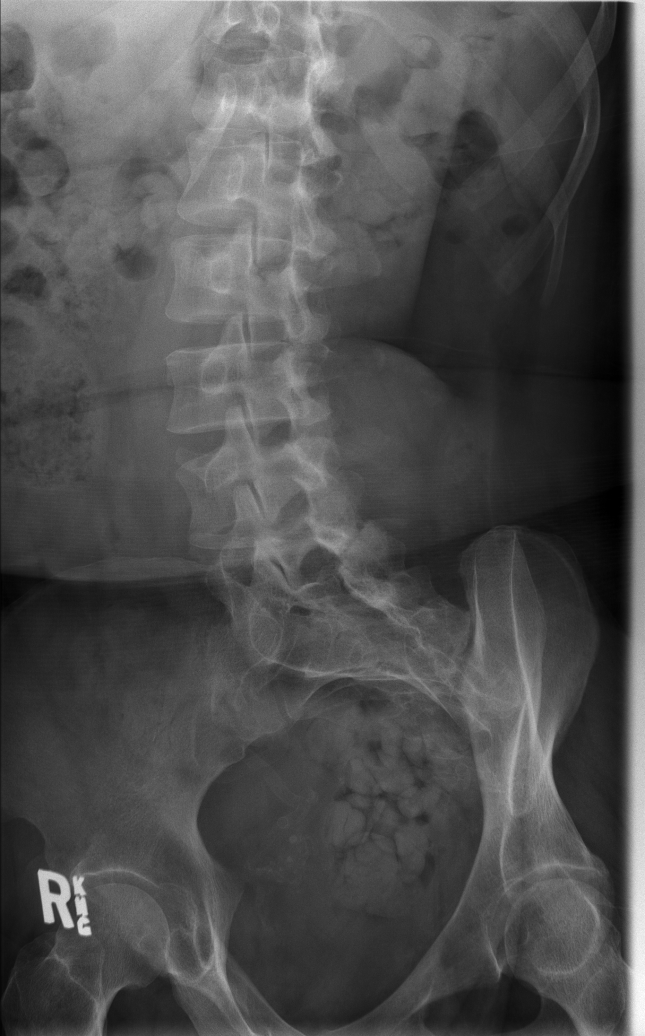

[t lumbar spine obl (2 of 2)]
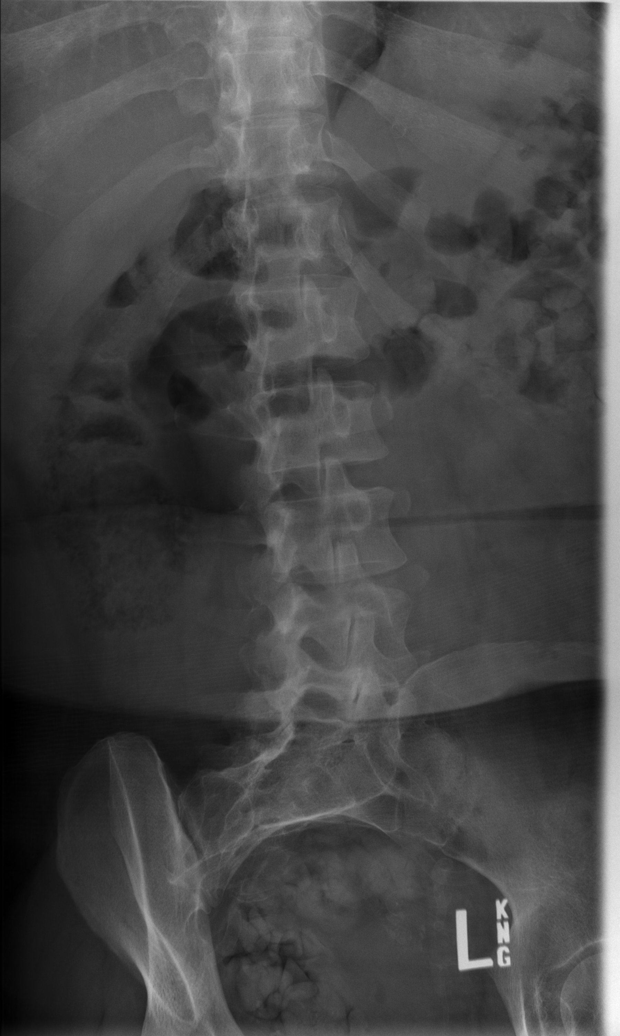

[t lumbar spine lat]
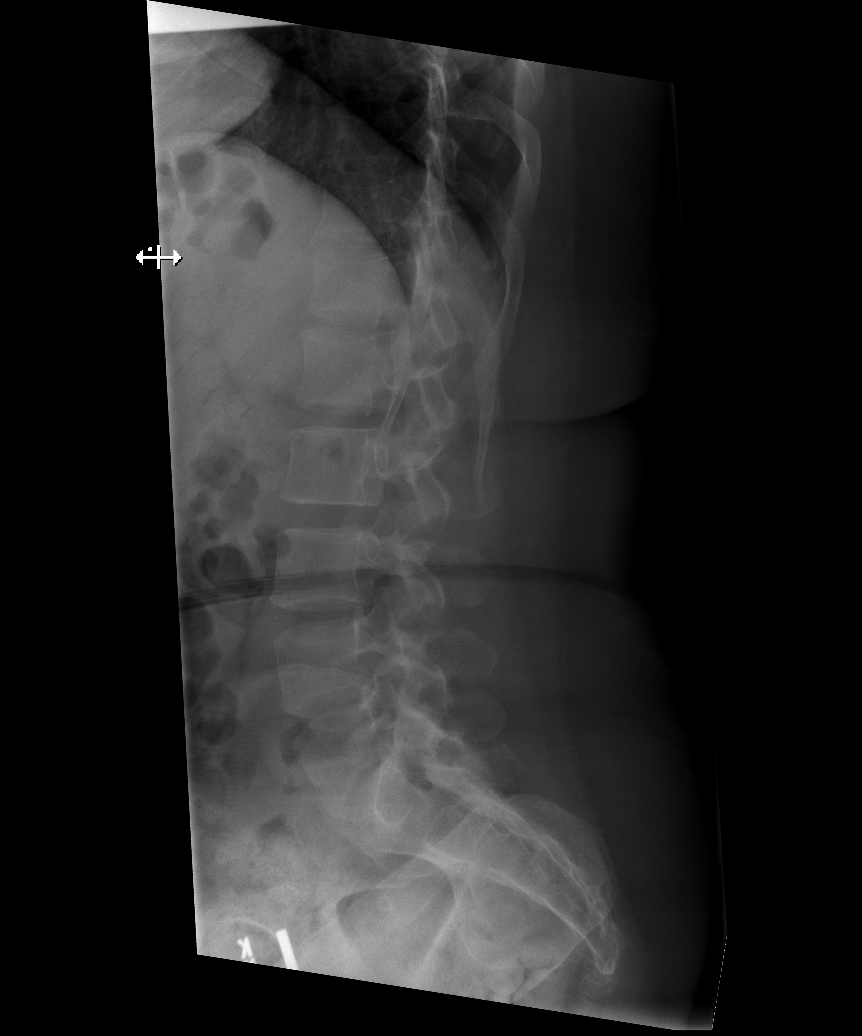

[t lumbar l-5 s-1 spot]
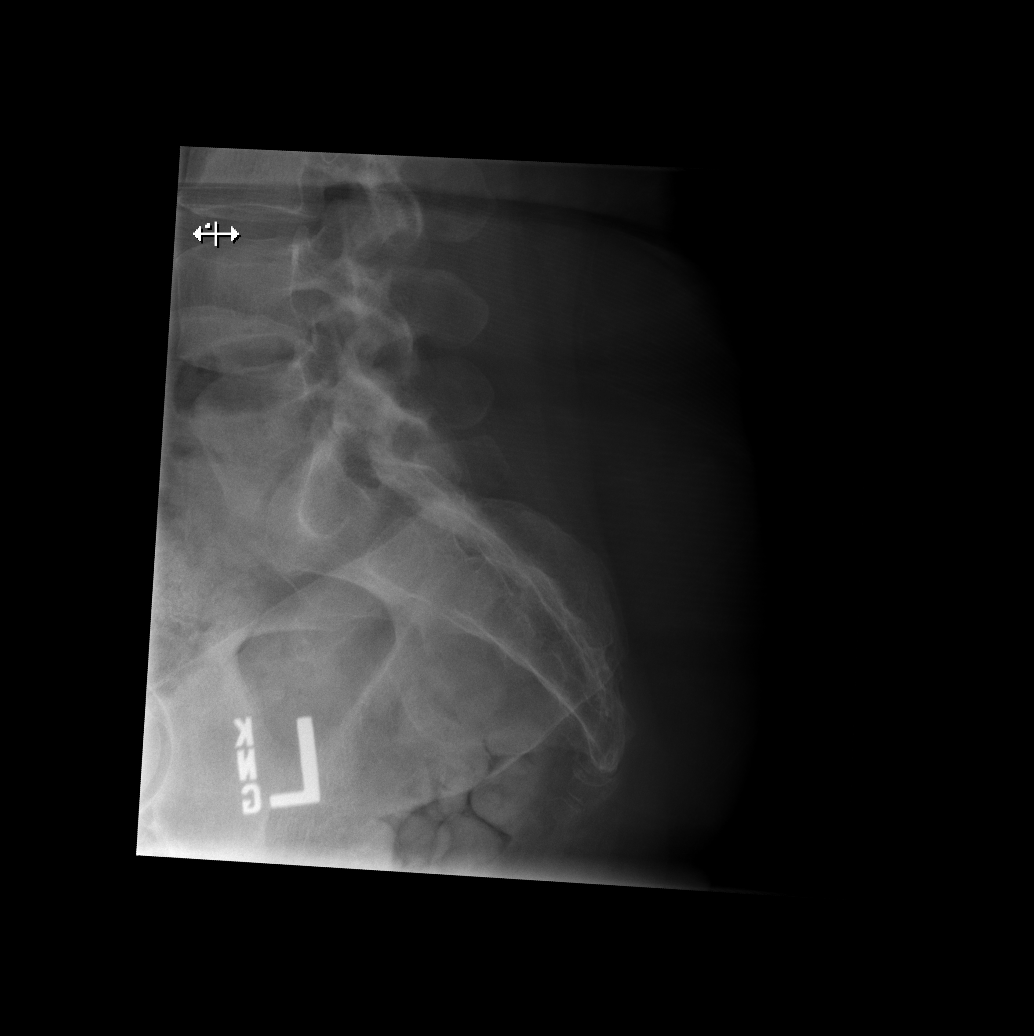

[5 of 5 positions shown; findings below may reference images not displayed]

FINDINGS: Frontal, lateral, spot lumbosacral lateral, and bilateral oblique
views were obtained. There are 5 non-rib-bearing lumbar type
vertebral bodies. There is slight rotatory scoliosis. There is mild
disc space narrowing at L3-4, L4-5, L5-S1. There is no appreciable
facet arthropathy.
IMPRESSION: Slight scoliosis with mild disc space narrowing at several levels.
No fracture or spondylolisthesis.

## 2016-06-27 DIAGNOSIS — D509 Iron deficiency anemia, unspecified: Secondary | ICD-10-CM | POA: Diagnosis not present

## 2016-06-27 DIAGNOSIS — N2581 Secondary hyperparathyroidism of renal origin: Secondary | ICD-10-CM | POA: Diagnosis not present

## 2016-06-27 DIAGNOSIS — N186 End stage renal disease: Secondary | ICD-10-CM | POA: Diagnosis not present

## 2016-06-29 DIAGNOSIS — N186 End stage renal disease: Secondary | ICD-10-CM | POA: Diagnosis not present

## 2016-06-29 DIAGNOSIS — D509 Iron deficiency anemia, unspecified: Secondary | ICD-10-CM | POA: Diagnosis not present

## 2016-06-29 DIAGNOSIS — N2581 Secondary hyperparathyroidism of renal origin: Secondary | ICD-10-CM | POA: Diagnosis not present

## 2016-07-02 DIAGNOSIS — N2581 Secondary hyperparathyroidism of renal origin: Secondary | ICD-10-CM | POA: Diagnosis not present

## 2016-07-02 DIAGNOSIS — D509 Iron deficiency anemia, unspecified: Secondary | ICD-10-CM | POA: Diagnosis not present

## 2016-07-02 DIAGNOSIS — N186 End stage renal disease: Secondary | ICD-10-CM | POA: Diagnosis not present

## 2016-07-04 DIAGNOSIS — N186 End stage renal disease: Secondary | ICD-10-CM | POA: Diagnosis not present

## 2016-07-04 DIAGNOSIS — D509 Iron deficiency anemia, unspecified: Secondary | ICD-10-CM | POA: Diagnosis not present

## 2016-07-04 DIAGNOSIS — N2581 Secondary hyperparathyroidism of renal origin: Secondary | ICD-10-CM | POA: Diagnosis not present

## 2016-07-06 DIAGNOSIS — N2581 Secondary hyperparathyroidism of renal origin: Secondary | ICD-10-CM | POA: Diagnosis not present

## 2016-07-06 DIAGNOSIS — N186 End stage renal disease: Secondary | ICD-10-CM | POA: Diagnosis not present

## 2016-07-06 DIAGNOSIS — D509 Iron deficiency anemia, unspecified: Secondary | ICD-10-CM | POA: Diagnosis not present

## 2016-07-09 DIAGNOSIS — N186 End stage renal disease: Secondary | ICD-10-CM | POA: Diagnosis not present

## 2016-07-09 DIAGNOSIS — D509 Iron deficiency anemia, unspecified: Secondary | ICD-10-CM | POA: Diagnosis not present

## 2016-07-09 DIAGNOSIS — N2581 Secondary hyperparathyroidism of renal origin: Secondary | ICD-10-CM | POA: Diagnosis not present

## 2016-07-11 DIAGNOSIS — N2581 Secondary hyperparathyroidism of renal origin: Secondary | ICD-10-CM | POA: Diagnosis not present

## 2016-07-11 DIAGNOSIS — N186 End stage renal disease: Secondary | ICD-10-CM | POA: Diagnosis not present

## 2016-07-11 DIAGNOSIS — D509 Iron deficiency anemia, unspecified: Secondary | ICD-10-CM | POA: Diagnosis not present

## 2016-07-12 DIAGNOSIS — N186 End stage renal disease: Secondary | ICD-10-CM | POA: Diagnosis not present

## 2016-07-12 DIAGNOSIS — Z992 Dependence on renal dialysis: Secondary | ICD-10-CM | POA: Diagnosis not present

## 2016-07-12 DIAGNOSIS — I120 Hypertensive chronic kidney disease with stage 5 chronic kidney disease or end stage renal disease: Secondary | ICD-10-CM | POA: Diagnosis not present

## 2016-07-13 DIAGNOSIS — N186 End stage renal disease: Secondary | ICD-10-CM | POA: Diagnosis not present

## 2016-07-13 DIAGNOSIS — D509 Iron deficiency anemia, unspecified: Secondary | ICD-10-CM | POA: Diagnosis not present

## 2016-07-13 DIAGNOSIS — N2581 Secondary hyperparathyroidism of renal origin: Secondary | ICD-10-CM | POA: Diagnosis not present

## 2016-07-13 DIAGNOSIS — Z23 Encounter for immunization: Secondary | ICD-10-CM | POA: Diagnosis not present

## 2016-07-16 DIAGNOSIS — Z23 Encounter for immunization: Secondary | ICD-10-CM | POA: Diagnosis not present

## 2016-07-16 DIAGNOSIS — N2581 Secondary hyperparathyroidism of renal origin: Secondary | ICD-10-CM | POA: Diagnosis not present

## 2016-07-16 DIAGNOSIS — D509 Iron deficiency anemia, unspecified: Secondary | ICD-10-CM | POA: Diagnosis not present

## 2016-07-16 DIAGNOSIS — N186 End stage renal disease: Secondary | ICD-10-CM | POA: Diagnosis not present

## 2016-07-18 DIAGNOSIS — Z23 Encounter for immunization: Secondary | ICD-10-CM | POA: Diagnosis not present

## 2016-07-18 DIAGNOSIS — N186 End stage renal disease: Secondary | ICD-10-CM | POA: Diagnosis not present

## 2016-07-18 DIAGNOSIS — D509 Iron deficiency anemia, unspecified: Secondary | ICD-10-CM | POA: Diagnosis not present

## 2016-07-18 DIAGNOSIS — N2581 Secondary hyperparathyroidism of renal origin: Secondary | ICD-10-CM | POA: Diagnosis not present

## 2016-07-20 DIAGNOSIS — N186 End stage renal disease: Secondary | ICD-10-CM | POA: Diagnosis not present

## 2016-07-20 DIAGNOSIS — N2581 Secondary hyperparathyroidism of renal origin: Secondary | ICD-10-CM | POA: Diagnosis not present

## 2016-07-20 DIAGNOSIS — D509 Iron deficiency anemia, unspecified: Secondary | ICD-10-CM | POA: Diagnosis not present

## 2016-07-20 DIAGNOSIS — Z23 Encounter for immunization: Secondary | ICD-10-CM | POA: Diagnosis not present

## 2016-07-23 DIAGNOSIS — N186 End stage renal disease: Secondary | ICD-10-CM | POA: Diagnosis not present

## 2016-07-23 DIAGNOSIS — Z23 Encounter for immunization: Secondary | ICD-10-CM | POA: Diagnosis not present

## 2016-07-23 DIAGNOSIS — D509 Iron deficiency anemia, unspecified: Secondary | ICD-10-CM | POA: Diagnosis not present

## 2016-07-23 DIAGNOSIS — N2581 Secondary hyperparathyroidism of renal origin: Secondary | ICD-10-CM | POA: Diagnosis not present

## 2016-07-24 ENCOUNTER — Telehealth: Payer: Self-pay | Admitting: *Deleted

## 2016-07-24 NOTE — Telephone Encounter (Signed)
Epivir authorized via Cover My Meds ([place] Medicare part D).  Medication will be covered from 04/25/16 - 07/24/17. RN faxed notification to pharmacy (no answer when tried to call pharm). Patient is due for refill today. Landis Gandy, RN

## 2016-07-25 DIAGNOSIS — Z23 Encounter for immunization: Secondary | ICD-10-CM | POA: Diagnosis not present

## 2016-07-25 DIAGNOSIS — N186 End stage renal disease: Secondary | ICD-10-CM | POA: Diagnosis not present

## 2016-07-25 DIAGNOSIS — D509 Iron deficiency anemia, unspecified: Secondary | ICD-10-CM | POA: Diagnosis not present

## 2016-07-25 DIAGNOSIS — N2581 Secondary hyperparathyroidism of renal origin: Secondary | ICD-10-CM | POA: Diagnosis not present

## 2016-07-27 DIAGNOSIS — N186 End stage renal disease: Secondary | ICD-10-CM | POA: Diagnosis not present

## 2016-07-27 DIAGNOSIS — N2581 Secondary hyperparathyroidism of renal origin: Secondary | ICD-10-CM | POA: Diagnosis not present

## 2016-07-27 DIAGNOSIS — D509 Iron deficiency anemia, unspecified: Secondary | ICD-10-CM | POA: Diagnosis not present

## 2016-07-27 DIAGNOSIS — Z23 Encounter for immunization: Secondary | ICD-10-CM | POA: Diagnosis not present

## 2016-07-30 DIAGNOSIS — N2581 Secondary hyperparathyroidism of renal origin: Secondary | ICD-10-CM | POA: Diagnosis not present

## 2016-07-30 DIAGNOSIS — D509 Iron deficiency anemia, unspecified: Secondary | ICD-10-CM | POA: Diagnosis not present

## 2016-07-30 DIAGNOSIS — Z23 Encounter for immunization: Secondary | ICD-10-CM | POA: Diagnosis not present

## 2016-07-30 DIAGNOSIS — N186 End stage renal disease: Secondary | ICD-10-CM | POA: Diagnosis not present

## 2016-08-01 DIAGNOSIS — D509 Iron deficiency anemia, unspecified: Secondary | ICD-10-CM | POA: Diagnosis not present

## 2016-08-01 DIAGNOSIS — N186 End stage renal disease: Secondary | ICD-10-CM | POA: Diagnosis not present

## 2016-08-01 DIAGNOSIS — Z23 Encounter for immunization: Secondary | ICD-10-CM | POA: Diagnosis not present

## 2016-08-01 DIAGNOSIS — N2581 Secondary hyperparathyroidism of renal origin: Secondary | ICD-10-CM | POA: Diagnosis not present

## 2016-08-03 DIAGNOSIS — N186 End stage renal disease: Secondary | ICD-10-CM | POA: Diagnosis not present

## 2016-08-03 DIAGNOSIS — D509 Iron deficiency anemia, unspecified: Secondary | ICD-10-CM | POA: Diagnosis not present

## 2016-08-03 DIAGNOSIS — Z23 Encounter for immunization: Secondary | ICD-10-CM | POA: Diagnosis not present

## 2016-08-03 DIAGNOSIS — N2581 Secondary hyperparathyroidism of renal origin: Secondary | ICD-10-CM | POA: Diagnosis not present

## 2016-08-06 DIAGNOSIS — Z23 Encounter for immunization: Secondary | ICD-10-CM | POA: Diagnosis not present

## 2016-08-06 DIAGNOSIS — D509 Iron deficiency anemia, unspecified: Secondary | ICD-10-CM | POA: Diagnosis not present

## 2016-08-06 DIAGNOSIS — N186 End stage renal disease: Secondary | ICD-10-CM | POA: Diagnosis not present

## 2016-08-06 DIAGNOSIS — N2581 Secondary hyperparathyroidism of renal origin: Secondary | ICD-10-CM | POA: Diagnosis not present

## 2016-08-08 DIAGNOSIS — Z23 Encounter for immunization: Secondary | ICD-10-CM | POA: Diagnosis not present

## 2016-08-08 DIAGNOSIS — N186 End stage renal disease: Secondary | ICD-10-CM | POA: Diagnosis not present

## 2016-08-08 DIAGNOSIS — N2581 Secondary hyperparathyroidism of renal origin: Secondary | ICD-10-CM | POA: Diagnosis not present

## 2016-08-08 DIAGNOSIS — D509 Iron deficiency anemia, unspecified: Secondary | ICD-10-CM | POA: Diagnosis not present

## 2016-08-10 DIAGNOSIS — N2581 Secondary hyperparathyroidism of renal origin: Secondary | ICD-10-CM | POA: Diagnosis not present

## 2016-08-10 DIAGNOSIS — D509 Iron deficiency anemia, unspecified: Secondary | ICD-10-CM | POA: Diagnosis not present

## 2016-08-10 DIAGNOSIS — N186 End stage renal disease: Secondary | ICD-10-CM | POA: Diagnosis not present

## 2016-08-10 DIAGNOSIS — Z23 Encounter for immunization: Secondary | ICD-10-CM | POA: Diagnosis not present

## 2016-08-11 DIAGNOSIS — Z992 Dependence on renal dialysis: Secondary | ICD-10-CM | POA: Diagnosis not present

## 2016-08-11 DIAGNOSIS — N186 End stage renal disease: Secondary | ICD-10-CM | POA: Diagnosis not present

## 2016-08-11 DIAGNOSIS — I120 Hypertensive chronic kidney disease with stage 5 chronic kidney disease or end stage renal disease: Secondary | ICD-10-CM | POA: Diagnosis not present

## 2016-08-13 DIAGNOSIS — N2581 Secondary hyperparathyroidism of renal origin: Secondary | ICD-10-CM | POA: Diagnosis not present

## 2016-08-13 DIAGNOSIS — N186 End stage renal disease: Secondary | ICD-10-CM | POA: Diagnosis not present

## 2016-08-13 DIAGNOSIS — D509 Iron deficiency anemia, unspecified: Secondary | ICD-10-CM | POA: Diagnosis not present

## 2016-08-15 DIAGNOSIS — N186 End stage renal disease: Secondary | ICD-10-CM | POA: Diagnosis not present

## 2016-08-15 DIAGNOSIS — D509 Iron deficiency anemia, unspecified: Secondary | ICD-10-CM | POA: Diagnosis not present

## 2016-08-15 DIAGNOSIS — N2581 Secondary hyperparathyroidism of renal origin: Secondary | ICD-10-CM | POA: Diagnosis not present

## 2016-08-17 DIAGNOSIS — D509 Iron deficiency anemia, unspecified: Secondary | ICD-10-CM | POA: Diagnosis not present

## 2016-08-17 DIAGNOSIS — N186 End stage renal disease: Secondary | ICD-10-CM | POA: Diagnosis not present

## 2016-08-17 DIAGNOSIS — N2581 Secondary hyperparathyroidism of renal origin: Secondary | ICD-10-CM | POA: Diagnosis not present

## 2016-08-20 DIAGNOSIS — N186 End stage renal disease: Secondary | ICD-10-CM | POA: Diagnosis not present

## 2016-08-20 DIAGNOSIS — D509 Iron deficiency anemia, unspecified: Secondary | ICD-10-CM | POA: Diagnosis not present

## 2016-08-20 DIAGNOSIS — N2581 Secondary hyperparathyroidism of renal origin: Secondary | ICD-10-CM | POA: Diagnosis not present

## 2016-08-22 DIAGNOSIS — N2581 Secondary hyperparathyroidism of renal origin: Secondary | ICD-10-CM | POA: Diagnosis not present

## 2016-08-22 DIAGNOSIS — N186 End stage renal disease: Secondary | ICD-10-CM | POA: Diagnosis not present

## 2016-08-22 DIAGNOSIS — D509 Iron deficiency anemia, unspecified: Secondary | ICD-10-CM | POA: Diagnosis not present

## 2016-08-24 DIAGNOSIS — N2581 Secondary hyperparathyroidism of renal origin: Secondary | ICD-10-CM | POA: Diagnosis not present

## 2016-08-24 DIAGNOSIS — D509 Iron deficiency anemia, unspecified: Secondary | ICD-10-CM | POA: Diagnosis not present

## 2016-08-24 DIAGNOSIS — N186 End stage renal disease: Secondary | ICD-10-CM | POA: Diagnosis not present

## 2016-08-27 DIAGNOSIS — N186 End stage renal disease: Secondary | ICD-10-CM | POA: Diagnosis not present

## 2016-08-27 DIAGNOSIS — D509 Iron deficiency anemia, unspecified: Secondary | ICD-10-CM | POA: Diagnosis not present

## 2016-08-27 DIAGNOSIS — N2581 Secondary hyperparathyroidism of renal origin: Secondary | ICD-10-CM | POA: Diagnosis not present

## 2016-08-29 DIAGNOSIS — D509 Iron deficiency anemia, unspecified: Secondary | ICD-10-CM | POA: Diagnosis not present

## 2016-08-29 DIAGNOSIS — N186 End stage renal disease: Secondary | ICD-10-CM | POA: Diagnosis not present

## 2016-08-29 DIAGNOSIS — N2581 Secondary hyperparathyroidism of renal origin: Secondary | ICD-10-CM | POA: Diagnosis not present

## 2016-08-31 DIAGNOSIS — D509 Iron deficiency anemia, unspecified: Secondary | ICD-10-CM | POA: Diagnosis not present

## 2016-08-31 DIAGNOSIS — N186 End stage renal disease: Secondary | ICD-10-CM | POA: Diagnosis not present

## 2016-08-31 DIAGNOSIS — N2581 Secondary hyperparathyroidism of renal origin: Secondary | ICD-10-CM | POA: Diagnosis not present

## 2016-09-03 DIAGNOSIS — N2581 Secondary hyperparathyroidism of renal origin: Secondary | ICD-10-CM | POA: Diagnosis not present

## 2016-09-03 DIAGNOSIS — N186 End stage renal disease: Secondary | ICD-10-CM | POA: Diagnosis not present

## 2016-09-03 DIAGNOSIS — D509 Iron deficiency anemia, unspecified: Secondary | ICD-10-CM | POA: Diagnosis not present

## 2016-09-05 DIAGNOSIS — N2581 Secondary hyperparathyroidism of renal origin: Secondary | ICD-10-CM | POA: Diagnosis not present

## 2016-09-05 DIAGNOSIS — N186 End stage renal disease: Secondary | ICD-10-CM | POA: Diagnosis not present

## 2016-09-05 DIAGNOSIS — D509 Iron deficiency anemia, unspecified: Secondary | ICD-10-CM | POA: Diagnosis not present

## 2016-09-07 DIAGNOSIS — N186 End stage renal disease: Secondary | ICD-10-CM | POA: Diagnosis not present

## 2016-09-07 DIAGNOSIS — N2581 Secondary hyperparathyroidism of renal origin: Secondary | ICD-10-CM | POA: Diagnosis not present

## 2016-09-07 DIAGNOSIS — D509 Iron deficiency anemia, unspecified: Secondary | ICD-10-CM | POA: Diagnosis not present

## 2016-09-10 DIAGNOSIS — N186 End stage renal disease: Secondary | ICD-10-CM | POA: Diagnosis not present

## 2016-09-10 DIAGNOSIS — D509 Iron deficiency anemia, unspecified: Secondary | ICD-10-CM | POA: Diagnosis not present

## 2016-09-10 DIAGNOSIS — N2581 Secondary hyperparathyroidism of renal origin: Secondary | ICD-10-CM | POA: Diagnosis not present

## 2016-09-11 DIAGNOSIS — N186 End stage renal disease: Secondary | ICD-10-CM | POA: Diagnosis not present

## 2016-09-11 DIAGNOSIS — I120 Hypertensive chronic kidney disease with stage 5 chronic kidney disease or end stage renal disease: Secondary | ICD-10-CM | POA: Diagnosis not present

## 2016-09-11 DIAGNOSIS — Z992 Dependence on renal dialysis: Secondary | ICD-10-CM | POA: Diagnosis not present

## 2016-09-12 DIAGNOSIS — N2581 Secondary hyperparathyroidism of renal origin: Secondary | ICD-10-CM | POA: Diagnosis not present

## 2016-09-12 DIAGNOSIS — N186 End stage renal disease: Secondary | ICD-10-CM | POA: Diagnosis not present

## 2016-09-12 DIAGNOSIS — D509 Iron deficiency anemia, unspecified: Secondary | ICD-10-CM | POA: Diagnosis not present

## 2016-09-14 DIAGNOSIS — D509 Iron deficiency anemia, unspecified: Secondary | ICD-10-CM | POA: Diagnosis not present

## 2016-09-14 DIAGNOSIS — N186 End stage renal disease: Secondary | ICD-10-CM | POA: Diagnosis not present

## 2016-09-14 DIAGNOSIS — N2581 Secondary hyperparathyroidism of renal origin: Secondary | ICD-10-CM | POA: Diagnosis not present

## 2016-09-17 ENCOUNTER — Emergency Department (HOSPITAL_COMMUNITY)
Admission: EM | Admit: 2016-09-17 | Discharge: 2016-09-17 | Disposition: A | Discharge status: Home / Self Care | Payer: Medicare Other | Attending: Emergency Medicine | Admitting: Emergency Medicine

## 2016-09-17 ENCOUNTER — Emergency Department (HOSPITAL_COMMUNITY): Payer: Medicare Other

## 2016-09-17 ENCOUNTER — Encounter (HOSPITAL_COMMUNITY): Payer: Self-pay

## 2016-09-17 DIAGNOSIS — N186 End stage renal disease: Secondary | ICD-10-CM | POA: Diagnosis not present

## 2016-09-17 DIAGNOSIS — Z992 Dependence on renal dialysis: Secondary | ICD-10-CM | POA: Diagnosis not present

## 2016-09-17 DIAGNOSIS — I120 Hypertensive chronic kidney disease with stage 5 chronic kidney disease or end stage renal disease: Secondary | ICD-10-CM | POA: Insufficient documentation

## 2016-09-17 DIAGNOSIS — Z8673 Personal history of transient ischemic attack (TIA), and cerebral infarction without residual deficits: Secondary | ICD-10-CM | POA: Insufficient documentation

## 2016-09-17 DIAGNOSIS — Z87891 Personal history of nicotine dependence: Secondary | ICD-10-CM | POA: Insufficient documentation

## 2016-09-17 DIAGNOSIS — I471 Supraventricular tachycardia: Secondary | ICD-10-CM | POA: Insufficient documentation

## 2016-09-17 DIAGNOSIS — N2581 Secondary hyperparathyroidism of renal origin: Secondary | ICD-10-CM | POA: Diagnosis not present

## 2016-09-17 DIAGNOSIS — Z79899 Other long term (current) drug therapy: Secondary | ICD-10-CM | POA: Insufficient documentation

## 2016-09-17 DIAGNOSIS — R000 Tachycardia, unspecified: Secondary | ICD-10-CM | POA: Diagnosis not present

## 2016-09-17 DIAGNOSIS — D509 Iron deficiency anemia, unspecified: Secondary | ICD-10-CM | POA: Diagnosis not present

## 2016-09-17 LAB — COMPREHENSIVE METABOLIC PANEL
ALBUMIN: 3.5 g/dL (ref 3.5–5.0)
ALT: 12 U/L — ABNORMAL LOW (ref 14–54)
ANION GAP: 10 (ref 5–15)
AST: 19 U/L (ref 15–41)
Alkaline Phosphatase: 40 U/L (ref 38–126)
BUN: 17 mg/dL (ref 6–20)
CHLORIDE: 97 mmol/L — AB (ref 101–111)
CO2: 28 mmol/L (ref 22–32)
Calcium: 8.8 mg/dL — ABNORMAL LOW (ref 8.9–10.3)
Creatinine, Ser: 6.08 mg/dL — ABNORMAL HIGH (ref 0.44–1.00)
GFR calc non Af Amer: 8 mL/min — ABNORMAL LOW (ref >60)
GFR, EST AFRICAN AMERICAN: 9 mL/min — AB (ref >60)
Glucose, Bld: 98 mg/dL (ref 65–99)
POTASSIUM: 3.8 mmol/L (ref 3.5–5.1)
SODIUM: 135 mmol/L (ref 135–145)
Total Bilirubin: 0.5 mg/dL (ref 0.3–1.2)
Total Protein: 6.7 g/dL (ref 6.5–8.1)

## 2016-09-17 LAB — CBC
HEMATOCRIT: 38.8 % (ref 36.0–46.0)
HEMOGLOBIN: 13.4 g/dL (ref 12.0–15.0)
MCH: 31.5 pg (ref 26.0–34.0)
MCHC: 34.5 g/dL (ref 30.0–36.0)
MCV: 91.3 fL (ref 78.0–100.0)
Platelets: 238 10*3/uL (ref 150–400)
RBC: 4.25 MIL/uL (ref 3.87–5.11)
RDW: 13.3 % (ref 11.5–15.5)
WBC: 7.6 10*3/uL (ref 4.0–10.5)

## 2016-09-17 LAB — I-STAT TROPONIN, ED: TROPONIN I, POC: 0.06 ng/mL (ref 0.00–0.08)

## 2016-09-17 LAB — TSH: TSH: 2.997 u[IU]/mL (ref 0.350–4.500)

## 2016-09-17 LAB — BRAIN NATRIURETIC PEPTIDE: B Natriuretic Peptide: 170.4 pg/mL — ABNORMAL HIGH (ref 0.0–100.0)

## 2016-09-17 LAB — MAGNESIUM: MAGNESIUM: 2.1 mg/dL (ref 1.7–2.4)

## 2016-09-17 NOTE — ED Triage Notes (Signed)
Pt was 3 hrs in to dialysis tx when heartrate reached 220.  Pt reports chest pain with SVT that has now resolved.  Pt given 6mg  Adenosine PTA which converted pt to sinus tach at 113.  Pt denies any chest pain at this time.  Pt has no hx of same.

## 2016-09-17 NOTE — Progress Notes (Signed)
Deaccessed graft with no difficulty,no blleding noted;pressure applied;dressing in place.

## 2016-09-17 NOTE — ED Notes (Signed)
IV team at bedside 

## 2016-09-17 NOTE — ED Provider Notes (Signed)
[place] DEPT Provider Note   CSN: [id number] Arrival date & time: 09/17/16  1039     History   Chief Complaint Chief Complaint  Patient presents with  . Tachycardia    HPI Tiffany Golden is a 40 y.o. female.  Patient with past medical history of HIV, ESRD on dialysis MWF, prior stroke and TIA, presents to the emergency department with chief complaint of SVT. She was at dialysis, and was 3 hours into her treatment, when she began having chest pain. Her heart rate was noted to be 220 bpm. She is brought to the emergency department by EMS, who administered 6 mg of adenosine which converted her to sinus tachycardia of about 115 bpm. She states that at this time, her chest pain resolved. She denies any shortness of breath. Denies any fevers, chills, or cough. She denies ever having had symptoms like this before. She denies any stimulants.  Last CD4 count from one year ago was 400.   The history is provided by the patient. No language interpreter was used.    Past Medical History:  Diagnosis Date  . Anemia   . Chronic kidney disease   . Complication of anesthesia   . Dialysis patient [facility])    mon, wed 72  . ESRD (end stage renal disease) ([place])   . HIV infection ([place])   . Hypertension   . PONV (postoperative nausea and vomiting)   . Stroke ([place])   . TIA (transient ischemic attack)    Hx:of    Patient Active Problem List   Diagnosis Date Noted  . Menorrhagia with regular cycle 04/26/2015  . Lumbar spondylosis with myelopathy 02/02/2015  . End stage renal disease ([place]) 07/30/2013  . Enteritis due to Clostridium difficile 04/07/2013  . TIA (transient ischemic attack) 04/05/2013  . Fever 04/05/2013  . ESRD on hemodialysis ([place]) 04/05/2013  . AIDS ([place]) 04/05/2013  . HIV (human immunodeficiency virus infection) ([place]) 09/06/2011  . ESRD (end stage renal disease) ([place]) 09/06/2011  . Hypertension 09/06/2011    Past Surgical History:  Procedure Laterality Date  .  ARTERIOVENOUS GRAFT PLACEMENT  09/11/11   left arm  . CESAREAN SECTION    . DILITATION & CURRETTAGE/HYSTROSCOPY WITH NOVASURE ABLATION N/A 05/03/2015   Procedure: DILATATION/HYSTEROSCOPY WITH NOVASURE ABLATION; uterine cavity length 5.0 cm, uterine cavity width 3.8 cm, power 105 watts; time 1 minute 12 seconds;  Surgeon: Jonnie Kind, MD;  Location: [facility];  Service: Gynecology;  Laterality: N/A;  . INSERTION OF DIALYSIS CATHETER Right 09/01/2013   Procedure: INSERTION OF DIALYSIS CATHETER Right Internal Jugular;  Surgeon: Elam Dutch, MD;  Location: [place];  Service: Vascular;  Laterality: Right;  . PATCH ANGIOPLASTY Left 09/01/2013   Procedure: PATCH ANGIOPLASTY;  Surgeon: Elam Dutch, MD;  Location: [place];  Service: Vascular;  Laterality: Left;  . REVISON OF ARTERIOVENOUS FISTULA Left 44/31/5400   Procedure: Plication left arm fistula;  Surgeon: Elam Dutch, MD;  Location: [place];  Service: Vascular;  Laterality: Left;    OB History    No data available       Home Medications    Prior to Admission medications   Medication Sig Start Date End Date Taking? Authorizing Provider  abacavir (ZIAGEN) 300 MG tablet TAKE 2 TABLETS BY MOUTH DAILY. 12/02/15   Truman Hayward, MD  albuterol (PROVENTIL HFA;VENTOLIN HFA) 108 (90 BASE) MCG/ACT inhaler Inhale 2 puffs into the lungs every 4 (four) hours as needed for wheezing. 10/18/15  Kathyrn Drown, MD  amoxicillin-clavulanate (AUGMENTIN) 875-125 MG tablet Take 1 tablet by mouth 2 (two) times daily. 10/18/15   Kathyrn Drown, MD  B Complex-C-Folic Acid (RENA-VITE RX) 1 MG TABS Take 1 tablet by mouth daily. 07/13/13   Historical Provider, MD  calcium carbonate (TUMS - DOSED IN MG ELEMENTAL CALCIUM) 500 MG chewable tablet Chew 1-2 tablets by mouth daily as needed for indigestion or heartburn.    Historical Provider, MD  dolutegravir (TIVICAY) 50 MG tablet Take 1 tablet (50 mg total) by mouth daily. 01/16/16   Truman Hayward, MD    fluconazole (DIFLUCAN) 150 MG tablet Take 1 tablet (150 mg total) by mouth once. 10/18/15   Kathyrn Drown, MD  ketorolac (TORADOL) 10 MG tablet Take 1 tablet (10 mg total) by mouth every 6 (six) hours as needed. 05/03/15   Jonnie Kind, MD  lamiVUDine (EPIVIR) 10 MG/ML solution TAKE 2.5 ML BY MOUTH EVERY DAY 01/16/16   Truman Hayward, MD  metoprolol tartrate (LOPRESSOR) 25 MG tablet Take 25 mg by mouth daily. Except takes none on dialysis days (Tuesday, Thursday, and Sunday).    Historical Provider, MD    Family History Family History  Problem Relation Age of Onset  . Hyperlipidemia Mother   . Cancer - Other Mother     Hx partial hysterectomy  . Heart disease Father     Hx CABG    Social History Social History  Substance Use Topics  . Smoking status: Former Smoker    Packs/day: 0.50    Years: 3.00    Types: Cigarettes    Quit date: 09/05/2009  . Smokeless tobacco: Never Used  . Alcohol use No     Allergies   Sulfa antibiotics; Fortaz [ceftazidime sodium in d5w]; and Vancomycin   Review of Systems Review of Systems  Cardiovascular: Positive for chest pain and palpitations.  All other systems reviewed and are negative.    Physical Exam Updated Vital Signs BP 106/94   Pulse (!) 121   Temp 98.3 F (36.8 C) (Oral)   Resp 16   Ht 5\' 1"  (1.549 m)   Wt 86.2 kg   SpO2 100%   BMI 35.90 kg/m   Physical Exam  Constitutional: She is oriented to person, place, and time. She appears well-developed and well-nourished.  HENT:  Head: Normocephalic and atraumatic.  Eyes: Conjunctivae and EOM are normal. Pupils are equal, round, and reactive to light.  Neck: Normal range of motion. Neck supple.  Cardiovascular: Exam reveals no gallop and no friction rub.   No murmur heard. Tachycardic  Pulmonary/Chest: Effort normal and breath sounds normal. No respiratory distress. She has no wheezes. She has no rales. She exhibits no tenderness.  Abdominal: Soft. Bowel sounds are  normal. She exhibits no distension and no mass. There is no tenderness. There is no rebound and no guarding.  Musculoskeletal: Normal range of motion. She exhibits no edema or tenderness.  Neurological: She is alert and oriented to person, place, and time.  Skin: Skin is warm and dry.  Psychiatric: She has a normal mood and affect. Her behavior is normal. Judgment and thought content normal.  Nursing note and vitals reviewed.    ED Treatments / Results  Labs (all labs ordered are listed, but only abnormal results are displayed) Labs Reviewed  COMPREHENSIVE METABOLIC PANEL - Abnormal; Notable for the following:       Result Value   Chloride 97 (*)    Creatinine,  Ser 6.08 (*)    Calcium 8.8 (*)    ALT 12 (*)    GFR calc non Af Amer 8 (*)    GFR calc Af Amer 9 (*)    All other components within normal limits  BRAIN NATRIURETIC PEPTIDE - Abnormal; Notable for the following:    B Natriuretic Peptide 170.4 (*)    All other components within normal limits  CBC  TSH  MAGNESIUM  I-STAT TROPOININ, ED    EKG  EKG Interpretation  Date/Time:  Monday September 17 2016 10:47:58 EST Ventricular Rate:  111 PR Interval:    QRS Duration: 84 QT Interval:  327 QTC Calculation: 445 R Axis:   -40 Text Interpretation:  Sinus tachycardia Probable left atrial enlargement Inferior infarct, old Probable anteroseptal infarct, old Confirmed by [facility] MD, JULIE ([id number]) on 09/17/2016 10:51:21 AM       Radiology Dg Chest Port 1 View  Result Date: 09/17/2016 CLINICAL DATA:  Tachycardia during dialysis. History of hypertension, previous CVA, HIV. EXAM: PORTABLE CHEST 1 VIEW COMPARISON:  Chest x-ray of September 01, 2013 FINDINGS: The lungs are adequately inflated and clear. The heart is top-normal in size. The pulmonary vascularity is normal. The mediastinum is normal in width. There is no pleural effusion or pneumothorax. The bony thorax is unremarkable. IMPRESSION: There is no active cardiopulmonary  disease. Electronically Signed   By: David  Martinique M.D.   On: 09/17/2016 12:06    Procedures Procedures (including critical care time)  Medications Ordered in ED Medications - No data to display   Initial Impression / Assessment and Plan / ED Course  I have reviewed the triage vital signs and the nursing notes.  Pertinent labs & imaging results that were available during my care of the patient were reviewed by me and considered in my medical decision making (see chart for details).  Clinical Course     Patient was at dialysis today, and had sudden onset chest pain and palpitations. Heart rate was 220s. Patient given 6 mg adenosine by EMS. No further chest pain, and heart rate is now in the 110s.  BP 106/74.  Patient alert and oriented.  Stable.  Will check labs and monitor.  1:26 PM Patient has remained stable during her entire ER stay. Patient discussed with Dr. Percival Spanish of cardiology, who advises no change in the patient's metoprolol medication. Recommends that the patient be seen in cardiology clinic, and states that if this becomes recurrent problem, the patient will likely need ablation. Advises vagal maneuvers if the symptoms recur, and return to emergency department.  Patient seen by and discussed with Dr. Gilford Raid, who agrees with the plan.  Final Clinical Impressions(s) / ED Diagnoses   Final diagnoses:  SVT (supraventricular tachycardia) [facility])    New Prescriptions New Prescriptions   No medications on file     Montine Circle, PA-C 09/17/16 [place], MD 09/17/16 [phone]

## 2016-09-19 DIAGNOSIS — D509 Iron deficiency anemia, unspecified: Secondary | ICD-10-CM | POA: Diagnosis not present

## 2016-09-19 DIAGNOSIS — N2581 Secondary hyperparathyroidism of renal origin: Secondary | ICD-10-CM | POA: Diagnosis not present

## 2016-09-19 DIAGNOSIS — N186 End stage renal disease: Secondary | ICD-10-CM | POA: Diagnosis not present

## 2016-09-21 DIAGNOSIS — N186 End stage renal disease: Secondary | ICD-10-CM | POA: Diagnosis not present

## 2016-09-21 DIAGNOSIS — N2581 Secondary hyperparathyroidism of renal origin: Secondary | ICD-10-CM | POA: Diagnosis not present

## 2016-09-21 DIAGNOSIS — D509 Iron deficiency anemia, unspecified: Secondary | ICD-10-CM | POA: Diagnosis not present

## 2016-09-24 DIAGNOSIS — N186 End stage renal disease: Secondary | ICD-10-CM | POA: Diagnosis not present

## 2016-09-24 DIAGNOSIS — N2581 Secondary hyperparathyroidism of renal origin: Secondary | ICD-10-CM | POA: Diagnosis not present

## 2016-09-24 DIAGNOSIS — D509 Iron deficiency anemia, unspecified: Secondary | ICD-10-CM | POA: Diagnosis not present

## 2016-09-26 DIAGNOSIS — N186 End stage renal disease: Secondary | ICD-10-CM | POA: Diagnosis not present

## 2016-09-26 DIAGNOSIS — D509 Iron deficiency anemia, unspecified: Secondary | ICD-10-CM | POA: Diagnosis not present

## 2016-09-26 DIAGNOSIS — N2581 Secondary hyperparathyroidism of renal origin: Secondary | ICD-10-CM | POA: Diagnosis not present

## 2016-09-28 ENCOUNTER — Encounter: Payer: Self-pay | Admitting: Cardiology

## 2016-09-28 DIAGNOSIS — N186 End stage renal disease: Secondary | ICD-10-CM | POA: Diagnosis not present

## 2016-09-28 DIAGNOSIS — N2581 Secondary hyperparathyroidism of renal origin: Secondary | ICD-10-CM | POA: Diagnosis not present

## 2016-09-28 DIAGNOSIS — D509 Iron deficiency anemia, unspecified: Secondary | ICD-10-CM | POA: Diagnosis not present

## 2016-09-30 DIAGNOSIS — N186 End stage renal disease: Secondary | ICD-10-CM | POA: Diagnosis not present

## 2016-09-30 DIAGNOSIS — D509 Iron deficiency anemia, unspecified: Secondary | ICD-10-CM | POA: Diagnosis not present

## 2016-09-30 DIAGNOSIS — N2581 Secondary hyperparathyroidism of renal origin: Secondary | ICD-10-CM | POA: Diagnosis not present

## 2016-10-02 DIAGNOSIS — N186 End stage renal disease: Secondary | ICD-10-CM | POA: Diagnosis not present

## 2016-10-02 DIAGNOSIS — N2581 Secondary hyperparathyroidism of renal origin: Secondary | ICD-10-CM | POA: Diagnosis not present

## 2016-10-02 DIAGNOSIS — D509 Iron deficiency anemia, unspecified: Secondary | ICD-10-CM | POA: Diagnosis not present

## 2016-10-05 DIAGNOSIS — N186 End stage renal disease: Secondary | ICD-10-CM | POA: Diagnosis not present

## 2016-10-05 DIAGNOSIS — N2581 Secondary hyperparathyroidism of renal origin: Secondary | ICD-10-CM | POA: Diagnosis not present

## 2016-10-05 DIAGNOSIS — D509 Iron deficiency anemia, unspecified: Secondary | ICD-10-CM | POA: Diagnosis not present

## 2016-10-08 DIAGNOSIS — N2581 Secondary hyperparathyroidism of renal origin: Secondary | ICD-10-CM | POA: Diagnosis not present

## 2016-10-08 DIAGNOSIS — N186 End stage renal disease: Secondary | ICD-10-CM | POA: Diagnosis not present

## 2016-10-08 DIAGNOSIS — D509 Iron deficiency anemia, unspecified: Secondary | ICD-10-CM | POA: Diagnosis not present

## 2016-10-09 ENCOUNTER — Ambulatory Visit: Payer: Medicare Other | Admitting: Cardiovascular Disease

## 2016-10-09 ENCOUNTER — Encounter: Payer: Self-pay | Admitting: Cardiology

## 2016-10-09 ENCOUNTER — Ambulatory Visit (INDEPENDENT_AMBULATORY_CARE_PROVIDER_SITE_OTHER): Payer: Medicare Other | Admitting: Cardiology

## 2016-10-09 VITALS — BP 122/82 | HR 73 | Ht 61.0 in | Wt 210.0 lb

## 2016-10-09 DIAGNOSIS — I471 Supraventricular tachycardia: Secondary | ICD-10-CM

## 2016-10-09 NOTE — Patient Instructions (Signed)
Medication Instructions:  Your physician recommends that you continue on your current medications as directed. Please refer to the Current Medication list given to you today.  If you need a refill on your cardiac medications before your next appointment, please call your pharmacy.   Labwork: None ordered  Testing/Procedures: None ordered  Follow-Up: Your physician wants you to follow-up in: 6 months with Dr. Camnitz.  You will receive a reminder letter in the mail two months in advance. If you don't receive a letter, please call our office to schedule the follow-up appointment.  Thank you for choosing [facility]!!   Bhumi Godbey, RN [phone]         

## 2016-10-09 NOTE — Progress Notes (Signed)
Electrophysiology Office Note   Date:  10/09/2016   ID:  Tiffany Golden, DOB Oct 02, 1976, MRN [id number]  PCP:  Sallee Lange, MD Primary Electrophysiologist:  Kimie Pidcock Meredith Leeds, MD    Chief Complaint  Patient presents with  . CONSULT    SVT     History of Present Illness: Tiffany Golden is a 40 y.o. female who presents today for electrophysiology evaluation.   Hx HIV, ESRD on dialysis MWF, prior stroke and TIA. Presented to the ER from dialysis with chest pain. HR was noted to be 220. EMS administered 6 mg adenosine which brought her HR down to 115 with resolution of her chest pain. Since that time, she is felt well without any further palpitations. She's been taking 12.5 mg twice daily of metoprolol. She says that when she was in tachycardia, she felt extreme anxiety, shortness of breath, and palpitations. She got the adenosine and afterwards had no further symptoms.  Today, she denies symptoms of palpitations, chest pain, shortness of breath, orthopnea, PND, lower extremity edema, claudication, dizziness, presyncope, syncope, bleeding, or neurologic sequela. The patient is tolerating medications without difficulties and is otherwise without complaint today.    Past Medical History:  Diagnosis Date  . Anemia   . Chronic kidney disease   . Complication of anesthesia   . Dialysis patient [facility])    mon, wed 22  . ESRD (end stage renal disease) ([place])   . HIV infection ([place])   . Hypertension   . PONV (postoperative nausea and vomiting)   . Stroke ([place])   . TIA (transient ischemic attack)    Hx:of   Past Surgical History:  Procedure Laterality Date  . ARTERIOVENOUS GRAFT PLACEMENT  09/11/11   left arm  . CESAREAN SECTION    . DILITATION & CURRETTAGE/HYSTROSCOPY WITH NOVASURE ABLATION N/A 05/03/2015   Procedure: DILATATION/HYSTEROSCOPY WITH NOVASURE ABLATION; uterine cavity length 5.0 cm, uterine cavity width 3.8 cm, power 105 watts; time 1 minute 12 seconds;  Surgeon: Jonnie Kind, MD;  Location[facility];  Service: Gynecology;  Laterality: N/A;  . INSERTION OF DIALYSIS CATHETER Right 09/01/2013   Procedure: INSERTION OF DIALYSIS CATHETER Right Internal Jugular;  Surgeon: Elam Dutch, MD;  Location: [place];  Service: Vascular;  Laterality: Right;  . PATCH ANGIOPLASTY Left 09/01/2013   Procedure: PATCH ANGIOPLASTY;  Surgeon: Elam Dutch, MD;  Location: [place];  Service: Vascular;  Laterality: Left;  . REVISON OF ARTERIOVENOUS FISTULA Left 18/29/9371   Procedure: Plication left arm fistula;  Surgeon: Elam Dutch, MD;  Location: [facility] OR;  Service: Vascular;  Laterality: Left;     Current Outpatient Prescriptions  Medication Sig Dispense Refill  . abacavir (ZIAGEN) 300 MG tablet TAKE 2 TABLETS BY MOUTH DAILY. (Patient taking differently: Take 600 mg by mouth at bedtime) 60 tablet 4  . albuterol (PROVENTIL HFA;VENTOLIN HFA) 108 (90 BASE) MCG/ACT inhaler Inhale 2 puffs into the lungs every 4 (four) hours as needed for wheezing. 1 Inhaler 2  . amoxicillin-clavulanate (AUGMENTIN) 875-125 MG tablet Take 1 tablet by mouth 2 (two) times daily. 20 tablet 0  . B Complex-C-Folic Acid (RENA-VITE RX) 1 MG TABS Take 1 tablet by mouth daily.    . calcium carbonate (TUMS - DOSED IN MG ELEMENTAL CALCIUM) 500 MG chewable tablet Chew 1-2 tablets by mouth daily as needed for indigestion or heartburn.    . dolutegravir (TIVICAY) 50 MG tablet Take 1 tablet (50 mg total) by mouth daily. (Patient taking differently:  Take 50 mg by mouth 2 (two) times daily. ) 30 tablet 5  . fluconazole (DIFLUCAN) 150 MG tablet Take 1 tablet (150 mg total) by mouth once. 1 tablet 4  . ketorolac (TORADOL) 10 MG tablet Take 1 tablet (10 mg total) by mouth every 6 (six) hours as needed. 20 tablet 0  . lamiVUDine (EPIVIR) 10 MG/ML solution TAKE 2.5 ML BY MOUTH EVERY DAY 240 mL 5  . metoprolol tartrate (LOPRESSOR) 25 MG tablet Take 12.5 mg by mouth 2 (two) times daily as needed (for blood pressure).  Except takes none on dialysis days (Tuesday, Thursday, and Sunday).    . naproxen sodium (ANAPROX) 220 MG tablet Take 440 mg by mouth 2 (two) times daily as needed (for pain or headache).     No current facility-administered medications for this visit.     Allergies:   Fortaz [ceftazidime sodium in d5w]; Sulfa antibiotics; and Vancomycin   Social History:  The patient  reports that she quit smoking about 7 years ago. Her smoking use included Cigarettes. She has a 1.50 pack-year smoking history. She has never used smokeless tobacco. She reports that she does not drink alcohol or use drugs.   Family History:  The patient's family history includes Cancer - Other in her mother; Heart disease in her father; Hyperlipidemia in her mother.    ROS:  Please see the history of present illness.   Otherwise, review of systems is positive for palpitations.   All other systems are reviewed and negative.    PHYSICAL EXAM: VS:  BP 122/82   Pulse 73   Ht 5\' 1"  (1.549 m)   Wt 210 lb (95.3 kg)   BMI 39.68 kg/m  , BMI Body mass index is 39.68 kg/m. GEN: Well nourished, well developed, in no acute distress  HEENT: normal  Neck: no JVD, carotid bruits, or masses Cardiac: RRR; no murmurs, rubs, or gallops,no edema  Respiratory:  clear to auscultation bilaterally, normal work of breathing GI: soft, nontender, nondistended, + BS MS: no deformity or atrophy  Skin: warm and dry Neuro:  Strength and sensation are intact Psych: euthymic mood, full affect  EKG:  EKG is not ordered today. Personal review of the ekg ordered shows SVT rate 220  Recent Labs: 09/17/2016: ALT 12; B Natriuretic Peptide 170.4; BUN 17; Creatinine, Ser 6.08; Hemoglobin 13.4; Magnesium 2.1; Platelets 238; Potassium 3.8; Sodium 135; TSH 2.997    Lipid Panel     Component Value Date/Time   CHOL 224 (H) 06/30/2015 1027   TRIG 103 06/30/2015 1027   HDL 54 06/30/2015 1027   CHOLHDL 4.1 06/30/2015 1027   VLDL 21 06/30/2015 1027    LDLCALC 149 (H) 06/30/2015 1027     Wt Readings from Last 3 Encounters:  10/09/16 210 lb (95.3 kg)  09/17/16 190 lb (86.2 kg)  10/18/15 201 lb (91.2 kg)      Other studies Reviewed: Additional studies/ records that were reviewed today include: TTE 2015  Review of the above records today demonstrates:  - Left ventricle: The cavity size was normal. Wall thickness was normal. Systolic function was normal. The estimated ejection fraction was in the range of 55% to 60%. - Mitral valve: There was mild regurgitation.   ASSESSMENT AND PLAN:  1.  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 I discussed with her further options of therapy. We discussed the option of ablation  versus medical management. This and benefits of ablation were discussed. Risks include bleeding, tamponade, heart block, and stroke. She understands these risks and would like to try medical management prior to ablation.  2. Hypertension: Well-controlled today.  Current medicines are reviewed at length with the patient today.   The patient does not have concerns regarding her medicines.  The following changes were made today:  none  Labs/ tests ordered today include:  No orders of the defined types were placed in this encounter.    Disposition:   FU with Santiago Graf 6 months.    Signed, Kajuana Shareef Meredith Leeds, MD  10/09/2016 3:14 PM     [address] [place] [address] [phone] (office) [phone] (fax)

## 2016-10-10 DIAGNOSIS — N2581 Secondary hyperparathyroidism of renal origin: Secondary | ICD-10-CM | POA: Diagnosis not present

## 2016-10-10 DIAGNOSIS — N186 End stage renal disease: Secondary | ICD-10-CM | POA: Diagnosis not present

## 2016-10-10 DIAGNOSIS — D509 Iron deficiency anemia, unspecified: Secondary | ICD-10-CM | POA: Diagnosis not present

## 2016-10-11 DIAGNOSIS — Z992 Dependence on renal dialysis: Secondary | ICD-10-CM | POA: Diagnosis not present

## 2016-10-11 DIAGNOSIS — N186 End stage renal disease: Secondary | ICD-10-CM | POA: Diagnosis not present

## 2016-10-11 DIAGNOSIS — I120 Hypertensive chronic kidney disease with stage 5 chronic kidney disease or end stage renal disease: Secondary | ICD-10-CM | POA: Diagnosis not present

## 2016-10-12 DIAGNOSIS — N186 End stage renal disease: Secondary | ICD-10-CM | POA: Diagnosis not present

## 2016-10-12 DIAGNOSIS — D509 Iron deficiency anemia, unspecified: Secondary | ICD-10-CM | POA: Diagnosis not present

## 2016-10-12 DIAGNOSIS — N2581 Secondary hyperparathyroidism of renal origin: Secondary | ICD-10-CM | POA: Diagnosis not present

## 2016-10-15 DIAGNOSIS — N186 End stage renal disease: Secondary | ICD-10-CM | POA: Diagnosis not present

## 2016-10-15 DIAGNOSIS — N2581 Secondary hyperparathyroidism of renal origin: Secondary | ICD-10-CM | POA: Diagnosis not present

## 2016-10-15 DIAGNOSIS — D509 Iron deficiency anemia, unspecified: Secondary | ICD-10-CM | POA: Diagnosis not present

## 2016-10-17 DIAGNOSIS — N2581 Secondary hyperparathyroidism of renal origin: Secondary | ICD-10-CM | POA: Diagnosis not present

## 2016-10-17 DIAGNOSIS — N186 End stage renal disease: Secondary | ICD-10-CM | POA: Diagnosis not present

## 2016-10-17 DIAGNOSIS — D509 Iron deficiency anemia, unspecified: Secondary | ICD-10-CM | POA: Diagnosis not present

## 2016-10-19 DIAGNOSIS — N2581 Secondary hyperparathyroidism of renal origin: Secondary | ICD-10-CM | POA: Diagnosis not present

## 2016-10-19 DIAGNOSIS — D509 Iron deficiency anemia, unspecified: Secondary | ICD-10-CM | POA: Diagnosis not present

## 2016-10-19 DIAGNOSIS — N186 End stage renal disease: Secondary | ICD-10-CM | POA: Diagnosis not present

## 2016-10-23 DIAGNOSIS — N186 End stage renal disease: Secondary | ICD-10-CM | POA: Diagnosis not present

## 2016-10-23 DIAGNOSIS — D509 Iron deficiency anemia, unspecified: Secondary | ICD-10-CM | POA: Diagnosis not present

## 2016-10-23 DIAGNOSIS — N2581 Secondary hyperparathyroidism of renal origin: Secondary | ICD-10-CM | POA: Diagnosis not present

## 2016-10-24 DIAGNOSIS — D509 Iron deficiency anemia, unspecified: Secondary | ICD-10-CM | POA: Diagnosis not present

## 2016-10-24 DIAGNOSIS — N186 End stage renal disease: Secondary | ICD-10-CM | POA: Diagnosis not present

## 2016-10-24 DIAGNOSIS — N2581 Secondary hyperparathyroidism of renal origin: Secondary | ICD-10-CM | POA: Diagnosis not present

## 2016-10-26 DIAGNOSIS — N186 End stage renal disease: Secondary | ICD-10-CM | POA: Diagnosis not present

## 2016-10-26 DIAGNOSIS — D509 Iron deficiency anemia, unspecified: Secondary | ICD-10-CM | POA: Diagnosis not present

## 2016-10-26 DIAGNOSIS — N2581 Secondary hyperparathyroidism of renal origin: Secondary | ICD-10-CM | POA: Diagnosis not present

## 2016-10-29 DIAGNOSIS — D509 Iron deficiency anemia, unspecified: Secondary | ICD-10-CM | POA: Diagnosis not present

## 2016-10-29 DIAGNOSIS — N2581 Secondary hyperparathyroidism of renal origin: Secondary | ICD-10-CM | POA: Diagnosis not present

## 2016-10-29 DIAGNOSIS — N186 End stage renal disease: Secondary | ICD-10-CM | POA: Diagnosis not present

## 2016-10-31 DIAGNOSIS — N2581 Secondary hyperparathyroidism of renal origin: Secondary | ICD-10-CM | POA: Diagnosis not present

## 2016-10-31 DIAGNOSIS — D509 Iron deficiency anemia, unspecified: Secondary | ICD-10-CM | POA: Diagnosis not present

## 2016-10-31 DIAGNOSIS — N186 End stage renal disease: Secondary | ICD-10-CM | POA: Diagnosis not present

## 2016-11-02 DIAGNOSIS — D509 Iron deficiency anemia, unspecified: Secondary | ICD-10-CM | POA: Diagnosis not present

## 2016-11-02 DIAGNOSIS — N186 End stage renal disease: Secondary | ICD-10-CM | POA: Diagnosis not present

## 2016-11-02 DIAGNOSIS — N2581 Secondary hyperparathyroidism of renal origin: Secondary | ICD-10-CM | POA: Diagnosis not present

## 2016-11-04 DIAGNOSIS — D509 Iron deficiency anemia, unspecified: Secondary | ICD-10-CM | POA: Diagnosis not present

## 2016-11-04 DIAGNOSIS — N2581 Secondary hyperparathyroidism of renal origin: Secondary | ICD-10-CM | POA: Diagnosis not present

## 2016-11-04 DIAGNOSIS — N186 End stage renal disease: Secondary | ICD-10-CM | POA: Diagnosis not present

## 2016-11-07 DIAGNOSIS — N186 End stage renal disease: Secondary | ICD-10-CM | POA: Diagnosis not present

## 2016-11-07 DIAGNOSIS — D509 Iron deficiency anemia, unspecified: Secondary | ICD-10-CM | POA: Diagnosis not present

## 2016-11-07 DIAGNOSIS — N2581 Secondary hyperparathyroidism of renal origin: Secondary | ICD-10-CM | POA: Diagnosis not present

## 2016-11-09 DIAGNOSIS — N2581 Secondary hyperparathyroidism of renal origin: Secondary | ICD-10-CM | POA: Diagnosis not present

## 2016-11-09 DIAGNOSIS — D509 Iron deficiency anemia, unspecified: Secondary | ICD-10-CM | POA: Diagnosis not present

## 2016-11-09 DIAGNOSIS — N186 End stage renal disease: Secondary | ICD-10-CM | POA: Diagnosis not present

## 2016-11-11 DIAGNOSIS — Z992 Dependence on renal dialysis: Secondary | ICD-10-CM | POA: Diagnosis not present

## 2016-11-11 DIAGNOSIS — D509 Iron deficiency anemia, unspecified: Secondary | ICD-10-CM | POA: Diagnosis not present

## 2016-11-11 DIAGNOSIS — N2581 Secondary hyperparathyroidism of renal origin: Secondary | ICD-10-CM | POA: Diagnosis not present

## 2016-11-11 DIAGNOSIS — N186 End stage renal disease: Secondary | ICD-10-CM | POA: Diagnosis not present

## 2016-11-11 DIAGNOSIS — I120 Hypertensive chronic kidney disease with stage 5 chronic kidney disease or end stage renal disease: Secondary | ICD-10-CM | POA: Diagnosis not present

## 2016-11-14 DIAGNOSIS — N2581 Secondary hyperparathyroidism of renal origin: Secondary | ICD-10-CM | POA: Diagnosis not present

## 2016-11-14 DIAGNOSIS — D509 Iron deficiency anemia, unspecified: Secondary | ICD-10-CM | POA: Diagnosis not present

## 2016-11-14 DIAGNOSIS — R7309 Other abnormal glucose: Secondary | ICD-10-CM | POA: Diagnosis not present

## 2016-11-14 DIAGNOSIS — N186 End stage renal disease: Secondary | ICD-10-CM | POA: Diagnosis not present

## 2016-11-16 DIAGNOSIS — R7309 Other abnormal glucose: Secondary | ICD-10-CM | POA: Diagnosis not present

## 2016-11-16 DIAGNOSIS — N2581 Secondary hyperparathyroidism of renal origin: Secondary | ICD-10-CM | POA: Diagnosis not present

## 2016-11-16 DIAGNOSIS — N186 End stage renal disease: Secondary | ICD-10-CM | POA: Diagnosis not present

## 2016-11-16 DIAGNOSIS — D509 Iron deficiency anemia, unspecified: Secondary | ICD-10-CM | POA: Diagnosis not present

## 2016-11-17 ENCOUNTER — Other Ambulatory Visit: Payer: Self-pay | Admitting: Infectious Disease

## 2016-11-19 DIAGNOSIS — R7309 Other abnormal glucose: Secondary | ICD-10-CM | POA: Diagnosis not present

## 2016-11-19 DIAGNOSIS — D509 Iron deficiency anemia, unspecified: Secondary | ICD-10-CM | POA: Diagnosis not present

## 2016-11-19 DIAGNOSIS — N2581 Secondary hyperparathyroidism of renal origin: Secondary | ICD-10-CM | POA: Diagnosis not present

## 2016-11-19 DIAGNOSIS — N186 End stage renal disease: Secondary | ICD-10-CM | POA: Diagnosis not present

## 2016-11-21 DIAGNOSIS — N2581 Secondary hyperparathyroidism of renal origin: Secondary | ICD-10-CM | POA: Diagnosis not present

## 2016-11-21 DIAGNOSIS — R7309 Other abnormal glucose: Secondary | ICD-10-CM | POA: Diagnosis not present

## 2016-11-21 DIAGNOSIS — N186 End stage renal disease: Secondary | ICD-10-CM | POA: Diagnosis not present

## 2016-11-21 DIAGNOSIS — D509 Iron deficiency anemia, unspecified: Secondary | ICD-10-CM | POA: Diagnosis not present

## 2016-11-23 DIAGNOSIS — N2581 Secondary hyperparathyroidism of renal origin: Secondary | ICD-10-CM | POA: Diagnosis not present

## 2016-11-23 DIAGNOSIS — R7309 Other abnormal glucose: Secondary | ICD-10-CM | POA: Diagnosis not present

## 2016-11-23 DIAGNOSIS — N186 End stage renal disease: Secondary | ICD-10-CM | POA: Diagnosis not present

## 2016-11-23 DIAGNOSIS — D509 Iron deficiency anemia, unspecified: Secondary | ICD-10-CM | POA: Diagnosis not present

## 2016-11-26 DIAGNOSIS — N186 End stage renal disease: Secondary | ICD-10-CM | POA: Diagnosis not present

## 2016-11-26 DIAGNOSIS — N2581 Secondary hyperparathyroidism of renal origin: Secondary | ICD-10-CM | POA: Diagnosis not present

## 2016-11-26 DIAGNOSIS — D509 Iron deficiency anemia, unspecified: Secondary | ICD-10-CM | POA: Diagnosis not present

## 2016-11-26 DIAGNOSIS — R7309 Other abnormal glucose: Secondary | ICD-10-CM | POA: Diagnosis not present

## 2016-11-28 DIAGNOSIS — N186 End stage renal disease: Secondary | ICD-10-CM | POA: Diagnosis not present

## 2016-11-28 DIAGNOSIS — R7309 Other abnormal glucose: Secondary | ICD-10-CM | POA: Diagnosis not present

## 2016-11-28 DIAGNOSIS — D509 Iron deficiency anemia, unspecified: Secondary | ICD-10-CM | POA: Diagnosis not present

## 2016-11-28 DIAGNOSIS — N2581 Secondary hyperparathyroidism of renal origin: Secondary | ICD-10-CM | POA: Diagnosis not present

## 2016-11-30 DIAGNOSIS — R7309 Other abnormal glucose: Secondary | ICD-10-CM | POA: Diagnosis not present

## 2016-11-30 DIAGNOSIS — D509 Iron deficiency anemia, unspecified: Secondary | ICD-10-CM | POA: Diagnosis not present

## 2016-11-30 DIAGNOSIS — N186 End stage renal disease: Secondary | ICD-10-CM | POA: Diagnosis not present

## 2016-11-30 DIAGNOSIS — N2581 Secondary hyperparathyroidism of renal origin: Secondary | ICD-10-CM | POA: Diagnosis not present

## 2016-12-03 DIAGNOSIS — N186 End stage renal disease: Secondary | ICD-10-CM | POA: Diagnosis not present

## 2016-12-03 DIAGNOSIS — D509 Iron deficiency anemia, unspecified: Secondary | ICD-10-CM | POA: Diagnosis not present

## 2016-12-03 DIAGNOSIS — R7309 Other abnormal glucose: Secondary | ICD-10-CM | POA: Diagnosis not present

## 2016-12-03 DIAGNOSIS — N2581 Secondary hyperparathyroidism of renal origin: Secondary | ICD-10-CM | POA: Diagnosis not present

## 2016-12-05 DIAGNOSIS — R7309 Other abnormal glucose: Secondary | ICD-10-CM | POA: Diagnosis not present

## 2016-12-05 DIAGNOSIS — D509 Iron deficiency anemia, unspecified: Secondary | ICD-10-CM | POA: Diagnosis not present

## 2016-12-05 DIAGNOSIS — N2581 Secondary hyperparathyroidism of renal origin: Secondary | ICD-10-CM | POA: Diagnosis not present

## 2016-12-05 DIAGNOSIS — N186 End stage renal disease: Secondary | ICD-10-CM | POA: Diagnosis not present

## 2016-12-07 DIAGNOSIS — D509 Iron deficiency anemia, unspecified: Secondary | ICD-10-CM | POA: Diagnosis not present

## 2016-12-07 DIAGNOSIS — N186 End stage renal disease: Secondary | ICD-10-CM | POA: Diagnosis not present

## 2016-12-07 DIAGNOSIS — N2581 Secondary hyperparathyroidism of renal origin: Secondary | ICD-10-CM | POA: Diagnosis not present

## 2016-12-07 DIAGNOSIS — R7309 Other abnormal glucose: Secondary | ICD-10-CM | POA: Diagnosis not present

## 2016-12-10 DIAGNOSIS — D509 Iron deficiency anemia, unspecified: Secondary | ICD-10-CM | POA: Diagnosis not present

## 2016-12-10 DIAGNOSIS — R7309 Other abnormal glucose: Secondary | ICD-10-CM | POA: Diagnosis not present

## 2016-12-10 DIAGNOSIS — N2581 Secondary hyperparathyroidism of renal origin: Secondary | ICD-10-CM | POA: Diagnosis not present

## 2016-12-10 DIAGNOSIS — N186 End stage renal disease: Secondary | ICD-10-CM | POA: Diagnosis not present

## 2016-12-12 DIAGNOSIS — N186 End stage renal disease: Secondary | ICD-10-CM | POA: Diagnosis not present

## 2016-12-12 DIAGNOSIS — N2581 Secondary hyperparathyroidism of renal origin: Secondary | ICD-10-CM | POA: Diagnosis not present

## 2016-12-12 DIAGNOSIS — D509 Iron deficiency anemia, unspecified: Secondary | ICD-10-CM | POA: Diagnosis not present

## 2016-12-12 DIAGNOSIS — I120 Hypertensive chronic kidney disease with stage 5 chronic kidney disease or end stage renal disease: Secondary | ICD-10-CM | POA: Diagnosis not present

## 2016-12-12 DIAGNOSIS — Z992 Dependence on renal dialysis: Secondary | ICD-10-CM | POA: Diagnosis not present

## 2016-12-12 DIAGNOSIS — R7309 Other abnormal glucose: Secondary | ICD-10-CM | POA: Diagnosis not present

## 2016-12-14 DIAGNOSIS — N2581 Secondary hyperparathyroidism of renal origin: Secondary | ICD-10-CM | POA: Diagnosis not present

## 2016-12-14 DIAGNOSIS — N186 End stage renal disease: Secondary | ICD-10-CM | POA: Diagnosis not present

## 2016-12-14 DIAGNOSIS — D509 Iron deficiency anemia, unspecified: Secondary | ICD-10-CM | POA: Diagnosis not present

## 2016-12-17 DIAGNOSIS — N2581 Secondary hyperparathyroidism of renal origin: Secondary | ICD-10-CM | POA: Diagnosis not present

## 2016-12-17 DIAGNOSIS — N186 End stage renal disease: Secondary | ICD-10-CM | POA: Diagnosis not present

## 2016-12-17 DIAGNOSIS — D509 Iron deficiency anemia, unspecified: Secondary | ICD-10-CM | POA: Diagnosis not present

## 2016-12-19 DIAGNOSIS — N186 End stage renal disease: Secondary | ICD-10-CM | POA: Diagnosis not present

## 2016-12-19 DIAGNOSIS — N2581 Secondary hyperparathyroidism of renal origin: Secondary | ICD-10-CM | POA: Diagnosis not present

## 2016-12-19 DIAGNOSIS — D509 Iron deficiency anemia, unspecified: Secondary | ICD-10-CM | POA: Diagnosis not present

## 2016-12-21 DIAGNOSIS — D509 Iron deficiency anemia, unspecified: Secondary | ICD-10-CM | POA: Diagnosis not present

## 2016-12-21 DIAGNOSIS — N2581 Secondary hyperparathyroidism of renal origin: Secondary | ICD-10-CM | POA: Diagnosis not present

## 2016-12-21 DIAGNOSIS — N186 End stage renal disease: Secondary | ICD-10-CM | POA: Diagnosis not present

## 2016-12-24 DIAGNOSIS — N186 End stage renal disease: Secondary | ICD-10-CM | POA: Diagnosis not present

## 2016-12-24 DIAGNOSIS — D509 Iron deficiency anemia, unspecified: Secondary | ICD-10-CM | POA: Diagnosis not present

## 2016-12-24 DIAGNOSIS — N2581 Secondary hyperparathyroidism of renal origin: Secondary | ICD-10-CM | POA: Diagnosis not present

## 2016-12-26 DIAGNOSIS — D509 Iron deficiency anemia, unspecified: Secondary | ICD-10-CM | POA: Diagnosis not present

## 2016-12-26 DIAGNOSIS — N2581 Secondary hyperparathyroidism of renal origin: Secondary | ICD-10-CM | POA: Diagnosis not present

## 2016-12-26 DIAGNOSIS — N186 End stage renal disease: Secondary | ICD-10-CM | POA: Diagnosis not present

## 2016-12-28 DIAGNOSIS — D509 Iron deficiency anemia, unspecified: Secondary | ICD-10-CM | POA: Diagnosis not present

## 2016-12-28 DIAGNOSIS — N186 End stage renal disease: Secondary | ICD-10-CM | POA: Diagnosis not present

## 2016-12-28 DIAGNOSIS — N2581 Secondary hyperparathyroidism of renal origin: Secondary | ICD-10-CM | POA: Diagnosis not present

## 2016-12-31 DIAGNOSIS — N2581 Secondary hyperparathyroidism of renal origin: Secondary | ICD-10-CM | POA: Diagnosis not present

## 2016-12-31 DIAGNOSIS — N186 End stage renal disease: Secondary | ICD-10-CM | POA: Diagnosis not present

## 2016-12-31 DIAGNOSIS — D509 Iron deficiency anemia, unspecified: Secondary | ICD-10-CM | POA: Diagnosis not present

## 2017-01-02 DIAGNOSIS — N2581 Secondary hyperparathyroidism of renal origin: Secondary | ICD-10-CM | POA: Diagnosis not present

## 2017-01-02 DIAGNOSIS — D509 Iron deficiency anemia, unspecified: Secondary | ICD-10-CM | POA: Diagnosis not present

## 2017-01-02 DIAGNOSIS — N186 End stage renal disease: Secondary | ICD-10-CM | POA: Diagnosis not present

## 2017-01-04 DIAGNOSIS — N2581 Secondary hyperparathyroidism of renal origin: Secondary | ICD-10-CM | POA: Diagnosis not present

## 2017-01-04 DIAGNOSIS — N186 End stage renal disease: Secondary | ICD-10-CM | POA: Diagnosis not present

## 2017-01-04 DIAGNOSIS — D509 Iron deficiency anemia, unspecified: Secondary | ICD-10-CM | POA: Diagnosis not present

## 2017-01-07 DIAGNOSIS — N186 End stage renal disease: Secondary | ICD-10-CM | POA: Diagnosis not present

## 2017-01-07 DIAGNOSIS — N2581 Secondary hyperparathyroidism of renal origin: Secondary | ICD-10-CM | POA: Diagnosis not present

## 2017-01-07 DIAGNOSIS — D509 Iron deficiency anemia, unspecified: Secondary | ICD-10-CM | POA: Diagnosis not present

## 2017-01-09 DIAGNOSIS — I120 Hypertensive chronic kidney disease with stage 5 chronic kidney disease or end stage renal disease: Secondary | ICD-10-CM | POA: Diagnosis not present

## 2017-01-09 DIAGNOSIS — Z992 Dependence on renal dialysis: Secondary | ICD-10-CM | POA: Diagnosis not present

## 2017-01-09 DIAGNOSIS — D509 Iron deficiency anemia, unspecified: Secondary | ICD-10-CM | POA: Diagnosis not present

## 2017-01-09 DIAGNOSIS — N2581 Secondary hyperparathyroidism of renal origin: Secondary | ICD-10-CM | POA: Diagnosis not present

## 2017-01-09 DIAGNOSIS — N186 End stage renal disease: Secondary | ICD-10-CM | POA: Diagnosis not present

## 2017-01-10 DIAGNOSIS — D509 Iron deficiency anemia, unspecified: Secondary | ICD-10-CM | POA: Diagnosis not present

## 2017-01-10 DIAGNOSIS — N2581 Secondary hyperparathyroidism of renal origin: Secondary | ICD-10-CM | POA: Diagnosis not present

## 2017-01-10 DIAGNOSIS — N186 End stage renal disease: Secondary | ICD-10-CM | POA: Diagnosis not present

## 2017-01-14 DIAGNOSIS — D509 Iron deficiency anemia, unspecified: Secondary | ICD-10-CM | POA: Diagnosis not present

## 2017-01-14 DIAGNOSIS — N2581 Secondary hyperparathyroidism of renal origin: Secondary | ICD-10-CM | POA: Diagnosis not present

## 2017-01-14 DIAGNOSIS — N186 End stage renal disease: Secondary | ICD-10-CM | POA: Diagnosis not present

## 2017-01-16 DIAGNOSIS — D509 Iron deficiency anemia, unspecified: Secondary | ICD-10-CM | POA: Diagnosis not present

## 2017-01-16 DIAGNOSIS — N186 End stage renal disease: Secondary | ICD-10-CM | POA: Diagnosis not present

## 2017-01-16 DIAGNOSIS — N2581 Secondary hyperparathyroidism of renal origin: Secondary | ICD-10-CM | POA: Diagnosis not present

## 2017-01-18 DIAGNOSIS — D509 Iron deficiency anemia, unspecified: Secondary | ICD-10-CM | POA: Diagnosis not present

## 2017-01-18 DIAGNOSIS — N2581 Secondary hyperparathyroidism of renal origin: Secondary | ICD-10-CM | POA: Diagnosis not present

## 2017-01-18 DIAGNOSIS — N186 End stage renal disease: Secondary | ICD-10-CM | POA: Diagnosis not present

## 2017-01-21 DIAGNOSIS — N186 End stage renal disease: Secondary | ICD-10-CM | POA: Diagnosis not present

## 2017-01-21 DIAGNOSIS — N2581 Secondary hyperparathyroidism of renal origin: Secondary | ICD-10-CM | POA: Diagnosis not present

## 2017-01-21 DIAGNOSIS — D509 Iron deficiency anemia, unspecified: Secondary | ICD-10-CM | POA: Diagnosis not present

## 2017-01-23 ENCOUNTER — Other Ambulatory Visit: Payer: Self-pay | Admitting: Infectious Disease

## 2017-01-23 DIAGNOSIS — N2581 Secondary hyperparathyroidism of renal origin: Secondary | ICD-10-CM | POA: Diagnosis not present

## 2017-01-23 DIAGNOSIS — B20 Human immunodeficiency virus [HIV] disease: Secondary | ICD-10-CM

## 2017-01-23 DIAGNOSIS — N186 End stage renal disease: Secondary | ICD-10-CM | POA: Diagnosis not present

## 2017-01-23 DIAGNOSIS — D509 Iron deficiency anemia, unspecified: Secondary | ICD-10-CM | POA: Diagnosis not present

## 2017-01-25 DIAGNOSIS — D509 Iron deficiency anemia, unspecified: Secondary | ICD-10-CM | POA: Diagnosis not present

## 2017-01-25 DIAGNOSIS — N2581 Secondary hyperparathyroidism of renal origin: Secondary | ICD-10-CM | POA: Diagnosis not present

## 2017-01-25 DIAGNOSIS — N186 End stage renal disease: Secondary | ICD-10-CM | POA: Diagnosis not present

## 2017-01-28 DIAGNOSIS — N186 End stage renal disease: Secondary | ICD-10-CM | POA: Diagnosis not present

## 2017-01-28 DIAGNOSIS — D509 Iron deficiency anemia, unspecified: Secondary | ICD-10-CM | POA: Diagnosis not present

## 2017-01-28 DIAGNOSIS — N2581 Secondary hyperparathyroidism of renal origin: Secondary | ICD-10-CM | POA: Diagnosis not present

## 2017-01-30 DIAGNOSIS — N2581 Secondary hyperparathyroidism of renal origin: Secondary | ICD-10-CM | POA: Diagnosis not present

## 2017-01-30 DIAGNOSIS — D509 Iron deficiency anemia, unspecified: Secondary | ICD-10-CM | POA: Diagnosis not present

## 2017-01-30 DIAGNOSIS — N186 End stage renal disease: Secondary | ICD-10-CM | POA: Diagnosis not present

## 2017-02-01 DIAGNOSIS — D509 Iron deficiency anemia, unspecified: Secondary | ICD-10-CM | POA: Diagnosis not present

## 2017-02-01 DIAGNOSIS — N2581 Secondary hyperparathyroidism of renal origin: Secondary | ICD-10-CM | POA: Diagnosis not present

## 2017-02-01 DIAGNOSIS — N186 End stage renal disease: Secondary | ICD-10-CM | POA: Diagnosis not present

## 2017-02-04 DIAGNOSIS — N186 End stage renal disease: Secondary | ICD-10-CM | POA: Diagnosis not present

## 2017-02-04 DIAGNOSIS — D509 Iron deficiency anemia, unspecified: Secondary | ICD-10-CM | POA: Diagnosis not present

## 2017-02-04 DIAGNOSIS — N2581 Secondary hyperparathyroidism of renal origin: Secondary | ICD-10-CM | POA: Diagnosis not present

## 2017-02-06 DIAGNOSIS — N186 End stage renal disease: Secondary | ICD-10-CM | POA: Diagnosis not present

## 2017-02-06 DIAGNOSIS — N2581 Secondary hyperparathyroidism of renal origin: Secondary | ICD-10-CM | POA: Diagnosis not present

## 2017-02-06 DIAGNOSIS — D509 Iron deficiency anemia, unspecified: Secondary | ICD-10-CM | POA: Diagnosis not present

## 2017-02-08 DIAGNOSIS — N2581 Secondary hyperparathyroidism of renal origin: Secondary | ICD-10-CM | POA: Diagnosis not present

## 2017-02-08 DIAGNOSIS — D509 Iron deficiency anemia, unspecified: Secondary | ICD-10-CM | POA: Diagnosis not present

## 2017-02-08 DIAGNOSIS — N186 End stage renal disease: Secondary | ICD-10-CM | POA: Diagnosis not present

## 2017-02-09 DIAGNOSIS — Z992 Dependence on renal dialysis: Secondary | ICD-10-CM | POA: Diagnosis not present

## 2017-02-09 DIAGNOSIS — N186 End stage renal disease: Secondary | ICD-10-CM | POA: Diagnosis not present

## 2017-02-09 DIAGNOSIS — I120 Hypertensive chronic kidney disease with stage 5 chronic kidney disease or end stage renal disease: Secondary | ICD-10-CM | POA: Diagnosis not present

## 2017-02-11 DIAGNOSIS — D509 Iron deficiency anemia, unspecified: Secondary | ICD-10-CM | POA: Diagnosis not present

## 2017-02-11 DIAGNOSIS — N2581 Secondary hyperparathyroidism of renal origin: Secondary | ICD-10-CM | POA: Diagnosis not present

## 2017-02-11 DIAGNOSIS — N186 End stage renal disease: Secondary | ICD-10-CM | POA: Diagnosis not present

## 2017-02-13 DIAGNOSIS — N2581 Secondary hyperparathyroidism of renal origin: Secondary | ICD-10-CM | POA: Diagnosis not present

## 2017-02-13 DIAGNOSIS — N186 End stage renal disease: Secondary | ICD-10-CM | POA: Diagnosis not present

## 2017-02-13 DIAGNOSIS — D509 Iron deficiency anemia, unspecified: Secondary | ICD-10-CM | POA: Diagnosis not present

## 2017-02-15 DIAGNOSIS — N2581 Secondary hyperparathyroidism of renal origin: Secondary | ICD-10-CM | POA: Diagnosis not present

## 2017-02-15 DIAGNOSIS — D509 Iron deficiency anemia, unspecified: Secondary | ICD-10-CM | POA: Diagnosis not present

## 2017-02-15 DIAGNOSIS — N186 End stage renal disease: Secondary | ICD-10-CM | POA: Diagnosis not present

## 2017-02-18 DIAGNOSIS — D509 Iron deficiency anemia, unspecified: Secondary | ICD-10-CM | POA: Diagnosis not present

## 2017-02-18 DIAGNOSIS — N186 End stage renal disease: Secondary | ICD-10-CM | POA: Diagnosis not present

## 2017-02-18 DIAGNOSIS — N2581 Secondary hyperparathyroidism of renal origin: Secondary | ICD-10-CM | POA: Diagnosis not present

## 2017-02-19 DIAGNOSIS — M47817 Spondylosis without myelopathy or radiculopathy, lumbosacral region: Secondary | ICD-10-CM | POA: Diagnosis not present

## 2017-02-19 DIAGNOSIS — G8929 Other chronic pain: Secondary | ICD-10-CM | POA: Diagnosis not present

## 2017-02-19 DIAGNOSIS — M4696 Unspecified inflammatory spondylopathy, lumbar region: Secondary | ICD-10-CM | POA: Diagnosis not present

## 2017-02-19 DIAGNOSIS — Z79899 Other long term (current) drug therapy: Secondary | ICD-10-CM | POA: Diagnosis not present

## 2017-02-21 ENCOUNTER — Encounter: Payer: Self-pay | Admitting: Emergency Medicine

## 2017-02-21 ENCOUNTER — Inpatient Hospital Stay (HOSPITAL_COMMUNITY)
Admission: EM | Admit: 2017-02-21 | Discharge: 2017-02-23 | DRG: 091 | Disposition: A | Discharge status: Home / Self Care | Payer: Medicare Other | Attending: Student in an Organized Health Care Education/Training Program | Admitting: Student in an Organized Health Care Education/Training Program

## 2017-02-21 ENCOUNTER — Emergency Department (HOSPITAL_COMMUNITY): Payer: Medicare Other

## 2017-02-21 DIAGNOSIS — Z8249 Family history of ischemic heart disease and other diseases of the circulatory system: Secondary | ICD-10-CM

## 2017-02-21 DIAGNOSIS — Z992 Dependence on renal dialysis: Secondary | ICD-10-CM | POA: Diagnosis not present

## 2017-02-21 DIAGNOSIS — N186 End stage renal disease: Secondary | ICD-10-CM

## 2017-02-21 DIAGNOSIS — N29 Other disorders of kidney and ureter in diseases classified elsewhere: Secondary | ICD-10-CM | POA: Diagnosis present

## 2017-02-21 DIAGNOSIS — F329 Major depressive disorder, single episode, unspecified: Secondary | ICD-10-CM | POA: Diagnosis present

## 2017-02-21 DIAGNOSIS — I10 Essential (primary) hypertension: Secondary | ICD-10-CM | POA: Diagnosis not present

## 2017-02-21 DIAGNOSIS — G8929 Other chronic pain: Secondary | ICD-10-CM | POA: Diagnosis present

## 2017-02-21 DIAGNOSIS — Z6835 Body mass index (BMI) 35.0-35.9, adult: Secondary | ICD-10-CM

## 2017-02-21 DIAGNOSIS — Z91199 Patient's noncompliance with other medical treatment and regimen due to unspecified reason: Secondary | ICD-10-CM

## 2017-02-21 DIAGNOSIS — I120 Hypertensive chronic kidney disease with stage 5 chronic kidney disease or end stage renal disease: Secondary | ICD-10-CM | POA: Diagnosis not present

## 2017-02-21 DIAGNOSIS — F29 Unspecified psychosis not due to a substance or known physiological condition: Secondary | ICD-10-CM | POA: Diagnosis not present

## 2017-02-21 DIAGNOSIS — T428X5A Adverse effect of antiparkinsonism drugs and other central muscle-tone depressants, initial encounter: Secondary | ICD-10-CM | POA: Diagnosis not present

## 2017-02-21 DIAGNOSIS — G9389 Other specified disorders of brain: Secondary | ICD-10-CM | POA: Diagnosis not present

## 2017-02-21 DIAGNOSIS — Z9119 Patient's noncompliance with other medical treatment and regimen: Secondary | ICD-10-CM

## 2017-02-21 DIAGNOSIS — Z9115 Patient's noncompliance with renal dialysis: Secondary | ICD-10-CM

## 2017-02-21 DIAGNOSIS — F321 Major depressive disorder, single episode, moderate: Secondary | ICD-10-CM

## 2017-02-21 DIAGNOSIS — G92 Toxic encephalopathy: Secondary | ICD-10-CM | POA: Diagnosis not present

## 2017-02-21 DIAGNOSIS — H0520 Unspecified exophthalmos: Secondary | ICD-10-CM | POA: Diagnosis present

## 2017-02-21 DIAGNOSIS — D72829 Elevated white blood cell count, unspecified: Secondary | ICD-10-CM | POA: Diagnosis present

## 2017-02-21 DIAGNOSIS — D631 Anemia in chronic kidney disease: Secondary | ICD-10-CM | POA: Diagnosis present

## 2017-02-21 DIAGNOSIS — F0391 Unspecified dementia with behavioral disturbance: Secondary | ICD-10-CM | POA: Diagnosis not present

## 2017-02-21 DIAGNOSIS — B20 Human immunodeficiency virus [HIV] disease: Secondary | ICD-10-CM | POA: Diagnosis present

## 2017-02-21 DIAGNOSIS — F4321 Adjustment disorder with depressed mood: Secondary | ICD-10-CM

## 2017-02-21 DIAGNOSIS — Z882 Allergy status to sulfonamides status: Secondary | ICD-10-CM

## 2017-02-21 DIAGNOSIS — M545 Low back pain: Secondary | ICD-10-CM | POA: Diagnosis present

## 2017-02-21 DIAGNOSIS — R4182 Altered mental status, unspecified: Secondary | ICD-10-CM | POA: Diagnosis not present

## 2017-02-21 DIAGNOSIS — Z8673 Personal history of transient ischemic attack (TIA), and cerebral infarction without residual deficits: Secondary | ICD-10-CM

## 2017-02-21 DIAGNOSIS — Z79899 Other long term (current) drug therapy: Secondary | ICD-10-CM

## 2017-02-21 DIAGNOSIS — R634 Abnormal weight loss: Secondary | ICD-10-CM | POA: Diagnosis present

## 2017-02-21 DIAGNOSIS — Z881 Allergy status to other antibiotic agents status: Secondary | ICD-10-CM

## 2017-02-21 DIAGNOSIS — E669 Obesity, unspecified: Secondary | ICD-10-CM | POA: Diagnosis present

## 2017-02-21 DIAGNOSIS — N2581 Secondary hyperparathyroidism of renal origin: Secondary | ICD-10-CM | POA: Diagnosis present

## 2017-02-21 DIAGNOSIS — R410 Disorientation, unspecified: Secondary | ICD-10-CM | POA: Diagnosis not present

## 2017-02-21 DIAGNOSIS — Z87891 Personal history of nicotine dependence: Secondary | ICD-10-CM

## 2017-02-21 DIAGNOSIS — Z79891 Long term (current) use of opiate analgesic: Secondary | ICD-10-CM

## 2017-02-21 DIAGNOSIS — F23 Brief psychotic disorder: Secondary | ICD-10-CM | POA: Diagnosis not present

## 2017-02-21 LAB — COMPREHENSIVE METABOLIC PANEL
ALT: 11 U/L — ABNORMAL LOW (ref 14–54)
AST: 14 U/L — AB (ref 15–41)
Albumin: 2.9 g/dL — ABNORMAL LOW (ref 3.5–5.0)
Alkaline Phosphatase: 48 U/L (ref 38–126)
Anion gap: 21 — ABNORMAL HIGH (ref 5–15)
BUN: 45 mg/dL — ABNORMAL HIGH (ref 6–20)
CHLORIDE: 96 mmol/L — AB (ref 101–111)
CO2: 21 mmol/L — ABNORMAL LOW (ref 22–32)
Calcium: 9.6 mg/dL (ref 8.9–10.3)
Creatinine, Ser: 13.12 mg/dL — ABNORMAL HIGH (ref 0.44–1.00)
GFR calc Af Amer: 4 mL/min — ABNORMAL LOW (ref >60)
GFR, EST NON AFRICAN AMERICAN: 3 mL/min — AB (ref >60)
Glucose, Bld: 81 mg/dL (ref 65–99)
POTASSIUM: 5 mmol/L (ref 3.5–5.1)
Sodium: 138 mmol/L (ref 135–145)
Total Bilirubin: 0.9 mg/dL (ref 0.3–1.2)
Total Protein: 7.6 g/dL (ref 6.5–8.1)

## 2017-02-21 LAB — I-STAT CHEM 8, ED
BUN: 44 mg/dL — AB (ref 6–20)
CALCIUM ION: 1.02 mmol/L — AB (ref 1.15–1.40)
Chloride: 101 mmol/L (ref 101–111)
Creatinine, Ser: 13.7 mg/dL — ABNORMAL HIGH (ref 0.44–1.00)
Glucose, Bld: 80 mg/dL (ref 65–99)
HCT: 34 % — ABNORMAL LOW (ref 36.0–46.0)
HEMOGLOBIN: 11.6 g/dL — AB (ref 12.0–15.0)
Potassium: 4.9 mmol/L (ref 3.5–5.1)
SODIUM: 135 mmol/L (ref 135–145)
TCO2: 26 mmol/L (ref 0–100)

## 2017-02-21 LAB — CBC
HCT: 34.4 % — ABNORMAL LOW (ref 36.0–46.0)
HEMOGLOBIN: 11.7 g/dL — AB (ref 12.0–15.0)
MCH: 31.2 pg (ref 26.0–34.0)
MCHC: 34 g/dL (ref 30.0–36.0)
MCV: 91.7 fL (ref 78.0–100.0)
Platelets: 345 10*3/uL (ref 150–400)
RBC: 3.75 MIL/uL — ABNORMAL LOW (ref 3.87–5.11)
RDW: 13.8 % (ref 11.5–15.5)
WBC: 12.7 10*3/uL — AB (ref 4.0–10.5)

## 2017-02-21 LAB — AMMONIA: Ammonia: 27 umol/L (ref 9–35)

## 2017-02-21 LAB — CBG MONITORING, ED: Glucose-Capillary: 71 mg/dL (ref 65–99)

## 2017-02-21 LAB — GLUCOSE, CAPILLARY: Glucose-Capillary: 85 mg/dL (ref 65–99)

## 2017-02-21 MED ORDER — SODIUM CHLORIDE 0.9 % IV SOLN: 100.0000 mL | INTRAVENOUS | Status: DC | PRN

## 2017-02-21 MED ORDER — ONDANSETRON HCL 4 MG/2ML IJ SOLN
4.0000 mg | Freq: Once | INTRAMUSCULAR | Status: AC
  Administered 2017-02-21: 4 mg via INTRAVENOUS

## 2017-02-21 MED ORDER — HEPARIN SODIUM (PORCINE) 5000 UNIT/ML IJ SOLN
5000.0000 [IU] | Freq: Three times a day (TID) | INTRAMUSCULAR | Status: DC
Start: 2017-02-21 — End: 2017-02-23
  Administered 2017-02-21 – 2017-02-22 (×4): 5000 [IU] via SUBCUTANEOUS
  Filled 2017-02-21 (×3): qty 1

## 2017-02-21 MED ORDER — HEPARIN SODIUM (PORCINE) 1000 UNIT/ML DIALYSIS
5000.0000 [IU] | INTRAMUSCULAR | Status: DC | PRN
  Filled 2017-02-21: qty 5

## 2017-02-21 MED ORDER — ALTEPLASE 2 MG IJ SOLR: 2.0000 mg | Freq: Once | INTRAMUSCULAR | Status: DC | PRN

## 2017-02-21 MED ORDER — DOLUTEGRAVIR SODIUM 50 MG PO TABS
50.0000 mg | ORAL_TABLET | Freq: Every day | ORAL | Status: DC
  Administered 2017-02-21 – 2017-02-22 (×2): 50 mg via ORAL
  Filled 2017-02-21 (×2): qty 1

## 2017-02-21 MED ORDER — LIDOCAINE HCL (PF) 1 % IJ SOLN: 5.0000 mL | INTRAMUSCULAR | Status: DC | PRN

## 2017-02-21 MED ORDER — ABACAVIR SULFATE 300 MG PO TABS
600.0000 mg | ORAL_TABLET | Freq: Every day | ORAL | Status: DC
  Administered 2017-02-21: 600 mg via ORAL
  Filled 2017-02-21 (×2): qty 2

## 2017-02-21 MED ORDER — CALCIUM CARBONATE ANTACID 500 MG PO CHEW: 1.0000 | CHEWABLE_TABLET | Freq: Every day | ORAL | Status: DC | PRN

## 2017-02-21 MED ORDER — LIDOCAINE-PRILOCAINE 2.5-2.5 % EX CREA: 1.0000 "application " | TOPICAL_CREAM | CUTANEOUS | Status: DC | PRN

## 2017-02-21 MED ORDER — SENNOSIDES-DOCUSATE SODIUM 8.6-50 MG PO TABS
1.0000 | ORAL_TABLET | Freq: Every day | ORAL | Status: DC
  Administered 2017-02-21 – 2017-02-22 (×2): 1 via ORAL
  Filled 2017-02-21 (×2): qty 1

## 2017-02-21 MED ORDER — CALCITRIOL 0.5 MCG PO CAPS
0.7500 ug | ORAL_CAPSULE | ORAL | Status: DC
  Administered 2017-02-22: 0.75 ug via ORAL
  Filled 2017-02-21: qty 1

## 2017-02-21 MED ORDER — ONDANSETRON HCL 4 MG/2ML IJ SOLN
INTRAMUSCULAR | Status: AC
  Filled 2017-02-21: qty 2

## 2017-02-21 MED ORDER — ACETAMINOPHEN 325 MG PO TABS
650.0000 mg | ORAL_TABLET | Freq: Four times a day (QID) | ORAL | Status: DC | PRN
Start: 2017-02-21 — End: 2017-02-23

## 2017-02-21 MED ORDER — HEPARIN SODIUM (PORCINE) 1000 UNIT/ML DIALYSIS: 1000.0000 [IU] | INTRAMUSCULAR | Status: DC | PRN

## 2017-02-21 MED ORDER — ALBUTEROL SULFATE (2.5 MG/3ML) 0.083% IN NEBU
2.5000 mg | INHALATION_SOLUTION | RESPIRATORY_TRACT | Status: DC | PRN
Start: 2017-02-21 — End: 2017-02-23

## 2017-02-21 MED ORDER — PENTAFLUOROPROP-TETRAFLUOROETH EX AERO: 1.0000 "application " | INHALATION_SPRAY | CUTANEOUS | Status: DC | PRN

## 2017-02-21 MED ORDER — SODIUM CHLORIDE 0.9% FLUSH
3.0000 mL | Freq: Two times a day (BID) | INTRAVENOUS | Status: DC
  Administered 2017-02-21 – 2017-02-23 (×4): 3 mL via INTRAVENOUS

## 2017-02-21 MED ORDER — CINACALCET HCL 30 MG PO TABS
60.0000 mg | ORAL_TABLET | ORAL | Status: DC
  Filled 2017-02-21: qty 2

## 2017-02-21 MED ORDER — LAMIVUDINE 10 MG/ML PO SOLN
25.0000 mg | Freq: Every day | ORAL | Status: DC
  Administered 2017-02-21 – 2017-02-22 (×2): 25 mg via ORAL
  Filled 2017-02-21 (×2): qty 5

## 2017-02-21 MED ORDER — ACETAMINOPHEN 650 MG RE SUPP: 650.0000 mg | Freq: Four times a day (QID) | RECTAL | Status: DC | PRN

## 2017-02-21 NOTE — Consult Note (Signed)
Reason for Consult: Continuity of ESRD care Referring Physician: Dr. Evette Doffing ([facility])  HPI:  41 year old African-American woman with past medical history significant for HIV infection, hypertension, history of TIA/CVA and end-stage renal disease on hemodialysis on a Monday/Wednesday/Friday schedule. Brought to the emergency room with 2 day history of changes in mental status/emotional lability following recent initiation of muscle relaxant/tramadol for back pain. Unfortunately, she missed her hemodialysis treatment yesterday. The patient is unable to give further history because of changes in her mental status leading to perseveration.  Dialysis prescription: Monday/Wednesday/Friday, [facility] 4 hours, 180 dialyzer, blood flow rate 450, dialysate flow 800, estimated dry weight 90.5 kg, 2K/2 calcium, and no UF profile, no sodium modeling. Left upper arm brachiocephalic fistula. Heparin 5000 unit bolus, Venofer 50 mg weekly, calcitriol 0.75 g 3 times a week, Sensipar 60 mg 3 times a week.  Past Medical History:  Diagnosis Date  . Anemia   . Chronic kidney disease   . Complication of anesthesia   . Dialysis patient [facility])    mon, wed 85  . ESRD (end stage renal disease) ([place])   . HIV infection ([place])   . Hypertension   . PONV (postoperative nausea and vomiting)   . Stroke ([place])   . TIA (transient ischemic attack)    Hx:of    Past Surgical History:  Procedure Laterality Date  . ARTERIOVENOUS GRAFT PLACEMENT  09/11/11   left arm  . CESAREAN SECTION    . DILITATION & CURRETTAGE/HYSTROSCOPY WITH NOVASURE ABLATION N/A 05/03/2015   Procedure: DILATATION/HYSTEROSCOPY WITH NOVASURE ABLATION; uterine cavity length 5.0 cm, uterine cavity width 3.8 cm, power 105 watts; time 1 minute 12 seconds;  Surgeon: Jonnie Kind, MD;  [facility];  Service: Gynecology;  Laterality: N/A;  . INSERTION OF DIALYSIS CATHETER Right 09/01/2013   Procedure: INSERTION OF DIALYSIS CATHETER  Right Internal Jugular;  Surgeon: Elam Dutch, MD;  Location: [place];  Service: Vascular;  Laterality: Right;  . PATCH ANGIOPLASTY Left 09/01/2013   Procedure: PATCH ANGIOPLASTY;  Surgeon: Elam Dutch, MD;  Location: [place];  Service: Vascular;  Laterality: Left;  . REVISON OF ARTERIOVENOUS FISTULA Left 52/84/1324   Procedure: Plication left arm fistula;  Surgeon: Elam Dutch, MD;  [facility];  Service: Vascular;  Laterality: Left;    Family History  Problem Relation Age of Onset  . Hyperlipidemia Mother   . Cancer - Other Mother     Hx partial hysterectomy  . Heart disease Father     Hx CABG    Social History:  reports that she quit smoking about 7 years ago. Her smoking use included Cigarettes. She has a 1.50 pack-year smoking history. She has never used smokeless tobacco. She reports that she does not drink alcohol or use drugs.  Allergies:  Allergies  Allergen Reactions  . Fortaz [Ceftazidime Sodium In D5w] Rash    Head-toe  . Sulfa Antibiotics Hives  . Vancomycin Rash    Head-toe    Medications: Prior to Admission:  (Not in a hospital admission)  BMP Latest Ref Rng & Units 02/21/2017 02/21/2017 09/17/2016  Glucose 65 - 99 mg/dL 80 81 98  BUN 6 - 20 mg/dL 44(H) 45(H) 17  Creatinine 0.44 - 1.00 mg/dL 13.70(H) 13.12(H) 6.08(H)  Sodium 135 - 145 mmol/L 135 138 135  Potassium 3.5 - 5.1 mmol/L 4.9 5.0 3.8  Chloride 101 - 111 mmol/L 101 96(L) 97(L)  CO2 22 - 32 mmol/L - 21(L) 28  Calcium  8.9 - 10.3 mg/dL - 9.6 8.8(L)   CBC Latest Ref Rng & Units 02/21/2017 02/21/2017 09/17/2016  WBC 4.0 - 10.5 K/uL - 12.7(H) 7.6  Hemoglobin 12.0 - 15.0 g/dL 11.6(L) 11.7(L) 13.4  Hematocrit 36.0 - 46.0 % 34.0(L) 34.4(L) 38.8  Platelets 150 - 400 K/uL - 345 238     Ct Head Wo Contrast  Result Date: 02/21/2017 CLINICAL DATA:  41 year old female with a history of altered mental status EXAM: CT HEAD WITHOUT CONTRAST TECHNIQUE: Contiguous axial images were obtained from the base  of the skull through the vertex without intravenous contrast. COMPARISON:  CT 04/04/2013, MR 04/05/2013 FINDINGS: Brain: No acute intracranial hemorrhage. No midline shift or mass effect. Gray-white differentiation maintained. Unremarkable appearance of the ventricular system. Vascular: Unremarkable. Skull: No acute fracture.  No aggressive bone lesion identified. Sinuses/Orbits: Unremarkable appearance of the orbits. Mastoid air cells clear. No middle ear effusion. No significant sinus disease. Other: None IMPRESSION: No CT evidence of acute intracranial abnormality. Electronically Signed   By: Corrie Mckusick D.O.   On: 02/21/2017 10:12    Review of Systems  Unable to perform ROS: Mental status change   Blood pressure 140/76, pulse 71, temperature 98 F (36.7 C), temperature source Oral, resp. rate 10, last menstrual period 05/01/2015, SpO2 99 %. Physical Exam  Nursing note and vitals reviewed. Constitutional: She appears well-developed and well-nourished. No distress.  HENT:  Head: Normocephalic and atraumatic.  Mouth/Throat: Oropharynx is clear and moist.  Eyes: EOM are normal. Pupils are equal, round, and reactive to light.  Neck: Normal range of motion. Neck supple. No JVD present.  Cardiovascular: Normal rate, regular rhythm and normal heart sounds.   Respiratory: Effort normal and breath sounds normal. She has no wheezes. She has no rales.  GI: Soft. Bowel sounds are normal. She exhibits no distension. There is no tenderness. There is no rebound.  Musculoskeletal: She exhibits no edema.  Left brachiocephalic fistula-aneurysmal  Neurological: She is alert. No cranial nerve deficit.  Not oriented to time or place-poorly oriented to person  Skin: Skin is warm and dry.  Psychiatric:  Mood abnormal-labile with impaired judgment and behavior    Assessment/Plan: 1.Altered mental status: Suspected iatrogenic/pharmacologically induced by recent initiation of narcotic analgesic and muscle  relaxant. Hold offensive agents and monitor with hemodialysis for clearance. 2. End-stage renal disease: She missed her hemodialysis treatment yesterday with changes in mentation and labs from today reviewed. We'll undertake hemodialysis today and then resume again tomorrow per her usual outpatient schedule. 3. Hypertension: Elevated blood pressure noted, monitor with ultrafiltration and hemodialysis and resume antihypertensive agents. 4. Anemia of chronic kidney disease: Hemoglobin currently at goal, monitor for triggers to start ESA. 5. Secondary hyperparathyroidism: Resume Sensipar and calcitriol for PTH suppression. Restart binders when reliably able to take oral intake.  Keyon Winnick K. 02/21/2017, 12:21 PM

## 2017-02-21 NOTE — ED Notes (Signed)
Pt laying in bed, family at bedside. Pt laughing loudly and then would become tearful and would make jokes at the same time. Pt asks repeat questions and then would stop talking mid-sentence.

## 2017-02-21 NOTE — Progress Notes (Signed)
New Admission Note:   Arrival Method: strethcer from ED  Mental Orientation: alert and orientated x  Telemetry: box 13  Assessment: Completed Skin: see flowsheet  IV: Right hand  Pain: denies  Tubes:none  Safety Measures: Safety Fall Prevention Plan has been discussed  Admission: pending  [facility] Orientation: Patient has been orientated to the room, unit and staff.  Family:Mother and father at bedside   Orders have been reviewed and implemented. Will continue to monitor the patient. Call light has been placed within reach and bed alarm has been activated.   Emilio Math, RN [facility]  Phone number: [phone]

## 2017-02-21 NOTE — H&P (Signed)
Date: 02/21/2017               Patient Name:  Tiffany Golden MRN: [id number]  DOB: Nov 20, 1975 Age / Sex: 41 y.o., female   PCP: Kathyrn Drown, MD         Medical Service: Internal Medicine Teaching Service         Attending Physician: Dr. Lalla Brothers    First Contact: Dr. Asencion Partridge Pager: [phone]  Second Contact: Dr. Burgess Estelle Pager: [phone]       After Hours (After 5p/  First Contact Pager: [phone]  weekends / holidays): Second Contact Pager: [phone]   Chief Complaint: Altered mental status  History of Present Illness: Tiffany Golden is a 41 y.o. woman with PMH HIV, ESRD on dialysis MWF, TIA (2015) who was brought to the ED by her family for confusion. She is unable to provide a history due to confusion, history obtained from parents at bedside. This morning she was not responding to tax recalls and they became concerned, visited her, and found her confused and lethargic. They report that she was in her normal state of health for the last 2 days. She lives independently although parents visit and help occasionally.  She missed her scheduled hemodialysis appointment yesterday for unclear reasons. This wart has been having chronic pain in her lower back and right hip and saw her pain specialist (Dr. Francesco Runner) on Tuesday of this week and was prescribed tramadol 50 mg and baclofen 10 mg. They are unsure how much she has taken. She has felt nauseous this morning and has been bringing up phlegm. She denies pain, headache, vomiting, fevers, chills, or other recent illness. She is able to remember some of her medications and her nephrologist's name.  In the ED she is afebrile, HR 95, RR 17, BP 180/103, SpO2 100% on room air with persistent confusion and labile affect. Labs were remarkable only for mild leukocytosis to 12.7 and CMP consistent with ESRD. CT head was obtained which revealed no intracranial abnormalities. IMTS was contacted for admission.  Meds:  Current Meds  Medication  Sig  . abacavir (ZIAGEN) 300 MG tablet TAKE 2 TABLETS BY MOUTH DAILY. (Patient taking differently: Take 600 mg by mouth at bedtime)  . B Complex-C-Folic Acid (RENA-VITE RX) 1 MG TABS Take 1 tablet by mouth daily.  . baclofen (LIORESAL) 10 MG tablet Take 5-10 mg by mouth 3 (three) times daily.  . calcium carbonate (TUMS - DOSED IN MG ELEMENTAL CALCIUM) 500 MG chewable tablet Chew 1-2 tablets by mouth daily as needed for indigestion or heartburn.  . dolutegravir (TIVICAY) 50 MG tablet Take 1 tablet (50 mg total) by mouth daily. (Patient taking differently: Take 50 mg by mouth 2 (two) times daily. )  . lamiVUDine (EPIVIR) 10 MG/ML solution TAKE 2.5 ML BY MOUTH EVERY DAY (Patient taking differently: Take 25 mg by mouth daily. )  . metoprolol tartrate (LOPRESSOR) 25 MG tablet Take 12.5 mg by mouth 2 (two) times daily as needed (for blood pressure). Except takes none on dialysis days (Tuesday, Thursday, and Sunday).  . naproxen sodium (ANAPROX) 220 MG tablet Take 440 mg by mouth 2 (two) times daily as needed (for pain or headache).  . traMADol (ULTRAM) 50 MG tablet Take 50 mg by mouth every 6 (six) hours.    Allergies: Allergies as of 02/21/2017 - Review Complete 02/21/2017  Allergen Reaction Noted  . Fortaz [ceftazidime sodium in d5w] Rash 07/03/2013  . Sulfa antibiotics  Hives 09/05/2011  . Vancomycin Rash 07/03/2013   Past Medical History:  Diagnosis Date  . Anemia   . Chronic kidney disease   . Complication of anesthesia   . Dialysis patient [facility])    mon, wed 14  . ESRD (end stage renal disease) ([place])   . HIV infection ([place])   . Hypertension   . PONV (postoperative nausea and vomiting)   . Stroke ([place])   . TIA (transient ischemic attack)    Hx:of  HD at [facility] in [place] Nephrologist is Dr. Jimmy Footman ID MD is Tommy Medal  Family History:  Family History  Problem Relation Age of Onset  . Hyperlipidemia Mother   . Cancer - Other Mother     Hx partial  hysterectomy  . Heart disease Father     Hx CABG    Social History:  Social History   Social History  . Marital status: Single    Spouse name: N/A  . Number of children: N/A  . Years of education: N/A   Occupational History  . Not on file.   Social History Main Topics  . Smoking status: Former Smoker    Packs/day: 0.50    Years: 3.00    Types: Cigarettes    Quit date: 09/05/2009  . Smokeless tobacco: Never Used  . Alcohol use No  . Drug use: No  . Sexual activity: Not Currently    Partners: Male     Comment: declined condoms   Other Topics Concern  . Not on file   Social History Narrative  . No narrative on file    Review of Systems: A complete ROS was negative except as per HPI.   Physical Exam: Blood pressure (!) 163/90, pulse 75, temperature 98 F (36.7 C), temperature source Oral, resp. rate 14, last menstrual period 05/01/2015, SpO2 98 %.  General appearance: Obese woman resting comfortably in bed, in mild distress, conversational but confused with poor attention and inappropriate answers, perseverates HENT: Normocephalic, atraumatic, moist mucous membranes Eyes: PERRL, EOM inact, non-icteric Cardiovascular: Regular rate and rhythm, no murmurs, rubs, gallops Respiratory: Clear to auscultation bilaterally, normal work of breathing Abdomen: Obese, BS+, soft, non-tender, non-distended Extremities: No edema, 2+ peripheral pulses Skin: Warm, dry, intact Neuro: Alert but oriented to self and place only, cranial nerves grossly intact but limited by patient cooperation and poor attention, dozes off intermittently, strength and sensation grossly intact Psych: Labile affect  EKG: NSR, prob LA enlargement  CXR: Normal  CT head: NAICA  Assessment & Plan by Problem: Principal Problem:   Altered mental status Active Problems:   HIV (human immunodeficiency virus infection) (HCC)   ESRD on hemodialysis (HCC)  Altered mental status, since this morning with  recently prescribed opiate and muscle relaxant, also missed HD yesterday. Called pharmacy and she received RX for Baclofen 10 mg TID (not prn) and Tramadol 50 mg Q6H (not prn) and she may have been taking them scheduled. Baclofen is renally cleared and requires lower doses in ESRD patients. Another concern is her HIV, for which she has not followed with ID since 2016. Her pharmacy reports her HIV prescriptions were last filled in early Dec 2017 (90 d supply). She has not focal neuro deficits on exam and aside from missing dialysis has no significant lab or imaging abnormalities. 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 Jimmy Footman and HD at [facility], followed with [facility] Transplant team and has rejected three potential donors. Presenting with creatinine 13.1, BUN 45, and CO2 21 and AG 21. -- Inpatient HD today then per schedule  -- Continue home calcitriol and cinacalcet  HIV, last CD4 400  and VL 32 in 06/2015, previously followed with [facility]/ Dr. Tommy Medal, on Abacavir, Dolutegravir, and Lamivudine. Last filled Rx in early Dec 2017 per her pharmacy -- Check HIV 1 RNA quant and CD4 count -- Ensure follow up with   HTN, at presentation up to 180/103, recently missed HD, on Metoprolol 12.5 BID at home -- Inpatient HD -- Resume home Metoprolol after HD  FEN/GI: NPO, replete electrolytes as needed  DVT ppx: Heparin TID  Code status: Full code  Dispo: Admit patient to Observation with expected length of stay less than 2 midnights.  Signed: Asencion Partridge, MD 02/21/2017, 10:53 AM  Pager: [phone]

## 2017-02-21 NOTE — ED Notes (Signed)
Pt at CT

## 2017-02-21 NOTE — ED Triage Notes (Signed)
Per EMS: pt from home with c/o altered mental status. She reports she was awoken by her father this morning as she was throwing up in the sink (she was not aware she was doing this, states she was sleeping during this). Pt is currently A&Ox4, follows commands but at home pt was acting hysterically, crying at inappropriate situations and then the patient would stare off into space. BP- 157/96, HR-92, RR-18, O2-100%, CBG-99. Pt missed her dialysis yesterday but does not give a clear reason for this, pt does not make urine. Pt stops in the middle of speaking and stares into space.

## 2017-02-21 NOTE — ED Provider Notes (Signed)
[facility] DEPT Provider Note   CSN: [id number] Arrival date & time: 02/21/17  0856     History   Chief Complaint Chief Complaint  Patient presents with  . Altered Mental Status    HPI Tiffany Golden is a 41 y.o. female.  HPI Tiffany Golden is a 41 y.o. female with hx of anemia, CKD on dialysis, HIV infection, CVA, presents to ED with Altered mental status. Patient lives at home alone, patient's parents help to take care of her. They stated that they talk to her yesterday and she seemed normal. This morning patient was not responding to their calls her text, so her father went over to her house, and found her at home, slightly disoriented. Patient has been complaining of some lower back pain and went to see her primary care doctor 2 days ago was started on tramadol 50 mg and baclofen 10 mg. She states she took a dose on Tuesday evening and 2 doses yesterday. Patient last had dialysis 3 days ago. She missed dialysis yesterday because of back pain. Patient denies any headache. No nausea or vomiting. No neck pain or stiffness. No fever or chills. Patient does not make urine. Denies any pain at this time. Family denied any similar symptoms in the past  Past Medical History:  Diagnosis Date  . Anemia   . Chronic kidney disease   . Complication of anesthesia   . Dialysis patient [facility])    mon, wed 51  . ESRD (end stage renal disease) ([place])   . HIV infection ([place])   . Hypertension   . PONV (postoperative nausea and vomiting)   . Stroke ([place])   . TIA (transient ischemic attack)    Hx:of    Patient Active Problem List   Diagnosis Date Noted  . Menorrhagia with regular cycle 04/26/2015  . Lumbar spondylosis with myelopathy 02/02/2015  . End stage renal disease ([place]) 07/30/2013  . Enteritis due to Clostridium difficile 04/07/2013  . TIA (transient ischemic attack) 04/05/2013  . Fever 04/05/2013  . ESRD on hemodialysis ([place]) 04/05/2013  . AIDS ([place]) 04/05/2013  . HIV (human  immunodeficiency virus infection) ([place]) 09/06/2011  . ESRD (end stage renal disease) ([place]) 09/06/2011  . Hypertension 09/06/2011    Past Surgical History:  Procedure Laterality Date  . ARTERIOVENOUS GRAFT PLACEMENT  09/11/11   left arm  . CESAREAN SECTION    . DILITATION & CURRETTAGE/HYSTROSCOPY WITH NOVASURE ABLATION N/A 05/03/2015   Procedure: DILATATION/HYSTEROSCOPY WITH NOVASURE ABLATION; uterine cavity length 5.0 cm, uterine cavity width 3.8 cm, power 105 watts; time 1 minute 12 seconds;  Surgeon: Jonnie Kind, MD;  Location: [facility];  Service: Gynecology;  Laterality: N/A;  . INSERTION OF DIALYSIS CATHETER Right 09/01/2013   Procedure: INSERTION OF DIALYSIS CATHETER Right Internal Jugular;  Surgeon: Elam Dutch, MD;  Location: [place];  Service: Vascular;  Laterality: Right;  . PATCH ANGIOPLASTY Left 09/01/2013   Procedure: PATCH ANGIOPLASTY;  Surgeon: Elam Dutch, MD;  Location: [place];  Service: Vascular;  Laterality: Left;  . REVISON OF ARTERIOVENOUS FISTULA Left 73/53/2992   Procedure: Plication left arm fistula;  Surgeon: Elam Dutch, MD;  Location: [place];  Service: Vascular;  Laterality: Left;    OB History    No data available       Home Medications    Prior to Admission medications   Medication Sig Start Date End Date Taking? Authorizing Provider  abacavir (ZIAGEN) 300 MG tablet TAKE 2 TABLETS BY  MOUTH DAILY. Patient taking differently: Take 600 mg by mouth at bedtime 12/02/15   Truman Hayward, MD  albuterol (PROVENTIL HFA;VENTOLIN HFA) 108 (90 BASE) MCG/ACT inhaler Inhale 2 puffs into the lungs every 4 (four) hours as needed for wheezing. 10/18/15   Kathyrn Drown, MD  amoxicillin-clavulanate (AUGMENTIN) 875-125 MG tablet Take 1 tablet by mouth 2 (two) times daily. 10/18/15   Kathyrn Drown, MD  B Complex-C-Folic Acid (RENA-VITE RX) 1 MG TABS Take 1 tablet by mouth daily. 07/13/13   Historical Provider, MD  calcium carbonate (TUMS - DOSED IN MG ELEMENTAL  CALCIUM) 500 MG chewable tablet Chew 1-2 tablets by mouth daily as needed for indigestion or heartburn.    Historical Provider, MD  dolutegravir (TIVICAY) 50 MG tablet Take 1 tablet (50 mg total) by mouth daily. Patient taking differently: Take 50 mg by mouth 2 (two) times daily.  01/16/16   Truman Hayward, MD  fluconazole (DIFLUCAN) 150 MG tablet Take 1 tablet (150 mg total) by mouth once. 10/18/15   Kathyrn Drown, MD  ketorolac (TORADOL) 10 MG tablet Take 1 tablet (10 mg total) by mouth every 6 (six) hours as needed. 05/03/15   Jonnie Kind, MD  lamiVUDine (EPIVIR) 10 MG/ML solution TAKE 2.5 ML BY MOUTH EVERY DAY 01/16/16   Truman Hayward, MD  metoprolol tartrate (LOPRESSOR) 25 MG tablet Take 12.5 mg by mouth 2 (two) times daily as needed (for blood pressure). Except takes none on dialysis days (Tuesday, Thursday, and Sunday).    Historical Provider, MD  naproxen sodium (ANAPROX) 220 MG tablet Take 440 mg by mouth 2 (two) times daily as needed (for pain or headache).    Historical Provider, MD    Family History Family History  Problem Relation Age of Onset  . Hyperlipidemia Mother   . Cancer - Other Mother     Hx partial hysterectomy  . Heart disease Father     Hx CABG    Social History Social History  Substance Use Topics  . Smoking status: Former Smoker    Packs/day: 0.50    Years: 3.00    Types: Cigarettes    Quit date: 09/05/2009  . Smokeless tobacco: Never Used  . Alcohol use No     Allergies   Fortaz [ceftazidime sodium in d5w]; Sulfa antibiotics; and Vancomycin   Review of Systems Review of Systems  Constitutional: Negative for chills and fever.  Respiratory: Negative for cough, chest tightness and shortness of breath.   Cardiovascular: Negative for chest pain, palpitations and leg swelling.  Gastrointestinal: Negative for abdominal pain, diarrhea, nausea and vomiting.  Genitourinary: Negative for dysuria, flank pain and pelvic pain.  Musculoskeletal:  Positive for back pain. Negative for arthralgias, myalgias, neck pain and neck stiffness.  Skin: Negative for rash.  Neurological: Negative for dizziness, weakness and headaches.  Psychiatric/Behavioral: Positive for confusion.  All other systems reviewed and are negative.    Physical Exam Updated Vital Signs BP (!) 163/90   Pulse 75   Temp 98 F (36.7 C) (Oral)   Resp 14   LMP 05/01/2015   SpO2 98%   Physical Exam  Constitutional: She is oriented to person, place, and time. She appears well-developed and well-nourished. No distress.  HENT:  Head: Normocephalic.  Eyes: Conjunctivae are normal.  Neck: Neck supple.  Cardiovascular: Normal rate, regular rhythm and normal heart sounds.   Pulmonary/Chest: Effort normal and breath sounds normal. No respiratory distress. She has no wheezes.  She has no rales.  Abdominal: Soft. Bowel sounds are normal. She exhibits no distension. There is no tenderness. There is no rebound.  Musculoskeletal: She exhibits no edema.  Neurological: She is alert and oriented to person, place, and time.  5/5 and equal upper and lower extremity strength bilaterally. Equal grip strength bilaterally. Normal finger to nose and heel to shin. No pronator drift. Patellar reflexes 2+. Pt repeating same question. Having some short term memory loss  Skin: Skin is warm and dry.  Psychiatric: She has a normal mood and affect. Her behavior is normal.  Nursing note and vitals reviewed.    ED Treatments / Results  Labs (all labs ordered are listed, but only abnormal results are displayed) Labs Reviewed  COMPREHENSIVE METABOLIC PANEL - Abnormal; Notable for the following:       Result Value   Chloride 96 (*)    CO2 21 (*)    BUN 45 (*)    Creatinine, Ser 13.12 (*)    Albumin 2.9 (*)    AST 14 (*)    ALT 11 (*)    GFR calc non Af Amer 3 (*)    GFR calc Af Amer 4 (*)    Anion gap 21 (*)    All other components within normal limits  CBC - Abnormal; Notable for  the following:    WBC 12.7 (*)    RBC 3.75 (*)    Hemoglobin 11.7 (*)    HCT 34.4 (*)    All other components within normal limits  I-STAT CHEM 8, ED - Abnormal; Notable for the following:    BUN 44 (*)    Creatinine, Ser 13.70 (*)    Calcium, Ion 1.02 (*)    Hemoglobin 11.6 (*)    HCT 34.0 (*)    All other components within normal limits  AMMONIA  CBG MONITORING, ED    EKG  EKG Interpretation  Date/Time:  Thursday February 21 2017 09:00:53 EDT Ventricular Rate:  91 PR Interval:    QRS Duration: 93 QT Interval:  371 QTC Calculation: 457 R Axis:   18 Text Interpretation:  Sinus rhythm Probable left atrial enlargement Low voltage, precordial leads Confirmed by Hazle Coca [phone]) on 02/21/2017 9:51:16 AM       Radiology Ct Head Wo Contrast  Result Date: 02/21/2017 CLINICAL DATA:  41 year old female with a history of altered mental status EXAM: CT HEAD WITHOUT CONTRAST TECHNIQUE: Contiguous axial images were obtained from the base of the skull through the vertex without intravenous contrast. COMPARISON:  CT 04/04/2013, MR 04/05/2013 FINDINGS: Brain: No acute intracranial hemorrhage. No midline shift or mass effect. Gray-white differentiation maintained. Unremarkable appearance of the ventricular system. Vascular: Unremarkable. Skull: No acute fracture.  No aggressive bone lesion identified. Sinuses/Orbits: Unremarkable appearance of the orbits. Mastoid air cells clear. No middle ear effusion. No significant sinus disease. Other: None IMPRESSION: No CT evidence of acute intracranial abnormality. Electronically Signed   By: Corrie Mckusick D.O.   On: 02/21/2017 10:12    Procedures Procedures (including critical care time)  Medications Ordered in ED Medications - No data to display   Initial Impression / Assessment and Plan / ED Course  I have reviewed the triage vital signs and the nursing notes.  Pertinent labs & imaging results that were available during my care of the patient  were reviewed by me and considered in my medical decision making (see chart for details).     Pt in ED with AMS. Pt with  mood swings, going from smiling and laughing to histerically crying. She is having some repetitive speech and repeating same questions. She is having some memory loss, she forgot what I just told her and forgot my name twice asking me what my name was 1 min apart. Her neurological exam otherwise unremarkable. Question CVA vs encephalopathy from starting tramadol and baclofen. Will get labs and CT head.   CT head negative. Labs showing worsening creatinine. WBC 12.7. hgb 11.7. Will admit for further evaluation. Discussed with Dr. Ralene Bathe who has seen pt and agrees to the plan. .   Final Clinical Impressions(s) / ED Diagnoses   Final diagnoses:  Altered mental status, unspecified altered mental status type    New Prescriptions New Prescriptions   No medications on file     Jeannett Senior, PA-C 02/21/17 [place], MD 02/22/17 [phone]

## 2017-02-22 ENCOUNTER — Encounter (HOSPITAL_COMMUNITY): Payer: Self-pay | Admitting: *Deleted

## 2017-02-22 DIAGNOSIS — Z21 Asymptomatic human immunodeficiency virus [HIV] infection status: Secondary | ICD-10-CM

## 2017-02-22 DIAGNOSIS — F4321 Adjustment disorder with depressed mood: Secondary | ICD-10-CM | POA: Diagnosis not present

## 2017-02-22 DIAGNOSIS — I120 Hypertensive chronic kidney disease with stage 5 chronic kidney disease or end stage renal disease: Secondary | ICD-10-CM | POA: Diagnosis not present

## 2017-02-22 DIAGNOSIS — F329 Major depressive disorder, single episode, unspecified: Secondary | ICD-10-CM | POA: Diagnosis not present

## 2017-02-22 DIAGNOSIS — G9340 Encephalopathy, unspecified: Secondary | ICD-10-CM | POA: Diagnosis not present

## 2017-02-22 DIAGNOSIS — G92 Toxic encephalopathy: Secondary | ICD-10-CM | POA: Diagnosis not present

## 2017-02-22 DIAGNOSIS — F4320 Adjustment disorder, unspecified: Secondary | ICD-10-CM | POA: Diagnosis not present

## 2017-02-22 DIAGNOSIS — G8929 Other chronic pain: Secondary | ICD-10-CM | POA: Diagnosis present

## 2017-02-22 DIAGNOSIS — Z9119 Patient's noncompliance with other medical treatment and regimen: Secondary | ICD-10-CM | POA: Diagnosis not present

## 2017-02-22 DIAGNOSIS — Z8249 Family history of ischemic heart disease and other diseases of the circulatory system: Secondary | ICD-10-CM | POA: Diagnosis not present

## 2017-02-22 DIAGNOSIS — T428X5A Adverse effect of antiparkinsonism drugs and other central muscle-tone depressants, initial encounter: Secondary | ICD-10-CM | POA: Diagnosis not present

## 2017-02-22 DIAGNOSIS — B20 Human immunodeficiency virus [HIV] disease: Secondary | ICD-10-CM

## 2017-02-22 DIAGNOSIS — D72829 Elevated white blood cell count, unspecified: Secondary | ICD-10-CM | POA: Diagnosis present

## 2017-02-22 DIAGNOSIS — H0520 Unspecified exophthalmos: Secondary | ICD-10-CM | POA: Diagnosis not present

## 2017-02-22 DIAGNOSIS — Z881 Allergy status to other antibiotic agents status: Secondary | ICD-10-CM

## 2017-02-22 DIAGNOSIS — Z6835 Body mass index (BMI) 35.0-35.9, adult: Secondary | ICD-10-CM | POA: Diagnosis not present

## 2017-02-22 DIAGNOSIS — Z8349 Family history of other endocrine, nutritional and metabolic diseases: Secondary | ICD-10-CM | POA: Diagnosis not present

## 2017-02-22 DIAGNOSIS — R4182 Altered mental status, unspecified: Secondary | ICD-10-CM | POA: Diagnosis not present

## 2017-02-22 DIAGNOSIS — Z87891 Personal history of nicotine dependence: Secondary | ICD-10-CM | POA: Diagnosis not present

## 2017-02-22 DIAGNOSIS — Z8049 Family history of malignant neoplasm of other genital organs: Secondary | ICD-10-CM

## 2017-02-22 DIAGNOSIS — N29 Other disorders of kidney and ureter in diseases classified elsewhere: Secondary | ICD-10-CM | POA: Diagnosis not present

## 2017-02-22 DIAGNOSIS — M545 Low back pain: Secondary | ICD-10-CM | POA: Diagnosis present

## 2017-02-22 DIAGNOSIS — E669 Obesity, unspecified: Secondary | ICD-10-CM | POA: Diagnosis present

## 2017-02-22 DIAGNOSIS — Z882 Allergy status to sulfonamides status: Secondary | ICD-10-CM

## 2017-02-22 DIAGNOSIS — D631 Anemia in chronic kidney disease: Secondary | ICD-10-CM | POA: Diagnosis not present

## 2017-02-22 DIAGNOSIS — Z992 Dependence on renal dialysis: Secondary | ICD-10-CM | POA: Diagnosis not present

## 2017-02-22 DIAGNOSIS — R634 Abnormal weight loss: Secondary | ICD-10-CM | POA: Diagnosis present

## 2017-02-22 DIAGNOSIS — F321 Major depressive disorder, single episode, moderate: Secondary | ICD-10-CM

## 2017-02-22 DIAGNOSIS — N186 End stage renal disease: Secondary | ICD-10-CM | POA: Diagnosis not present

## 2017-02-22 DIAGNOSIS — Z91199 Patient's noncompliance with other medical treatment and regimen due to unspecified reason: Secondary | ICD-10-CM

## 2017-02-22 DIAGNOSIS — Z8673 Personal history of transient ischemic attack (TIA), and cerebral infarction without residual deficits: Secondary | ICD-10-CM | POA: Diagnosis not present

## 2017-02-22 DIAGNOSIS — Z9115 Patient's noncompliance with renal dialysis: Secondary | ICD-10-CM | POA: Diagnosis not present

## 2017-02-22 DIAGNOSIS — N2581 Secondary hyperparathyroidism of renal origin: Secondary | ICD-10-CM | POA: Diagnosis not present

## 2017-02-22 LAB — CBC
HEMATOCRIT: 36 % (ref 36.0–46.0)
Hemoglobin: 12.2 g/dL (ref 12.0–15.0)
MCH: 31 pg (ref 26.0–34.0)
MCHC: 33.9 g/dL (ref 30.0–36.0)
MCV: 91.6 fL (ref 78.0–100.0)
PLATELETS: 375 10*3/uL (ref 150–400)
RBC: 3.93 MIL/uL (ref 3.87–5.11)
RDW: 14 % (ref 11.5–15.5)
WBC: 8.9 10*3/uL (ref 4.0–10.5)

## 2017-02-22 LAB — HIV 1/2 AB DIFFERENTIATION
HIV 1 AB: POSITIVE — AB
HIV 2 AB: NEGATIVE

## 2017-02-22 LAB — RENAL FUNCTION PANEL
Albumin: 2.8 g/dL — ABNORMAL LOW (ref 3.5–5.0)
Anion gap: 18 — ABNORMAL HIGH (ref 5–15)
BUN: 25 mg/dL — ABNORMAL HIGH (ref 6–20)
CALCIUM: 9.2 mg/dL (ref 8.9–10.3)
CHLORIDE: 92 mmol/L — AB (ref 101–111)
CO2: 24 mmol/L (ref 22–32)
CREATININE: 7.59 mg/dL — AB (ref 0.44–1.00)
GFR calc non Af Amer: 6 mL/min — ABNORMAL LOW (ref >60)
GFR, EST AFRICAN AMERICAN: 7 mL/min — AB (ref >60)
GLUCOSE: 78 mg/dL (ref 65–99)
Phosphorus: 6.3 mg/dL — ABNORMAL HIGH (ref 2.5–4.6)
Potassium: 4.6 mmol/L (ref 3.5–5.1)
Sodium: 134 mmol/L — ABNORMAL LOW (ref 135–145)

## 2017-02-22 LAB — MRSA PCR SCREENING: MRSA by PCR: NEGATIVE

## 2017-02-22 LAB — T-HELPER CELLS (CD4) COUNT (NOT AT ARMC)
CD4 % Helper T Cell: 22 % — ABNORMAL LOW (ref 33–55)
CD4 T Cell Abs: 180 /uL — ABNORMAL LOW (ref 400–2700)

## 2017-02-22 LAB — TSH: TSH: 2.551 u[IU]/mL (ref 0.350–4.500)

## 2017-02-22 MED ORDER — METOPROLOL TARTRATE 12.5 MG HALF TABLET: 12.5000 mg | ORAL_TABLET | Freq: Two times a day (BID) | ORAL | Status: DC

## 2017-02-22 MED ORDER — DAPSONE 100 MG PO TABS
100.0000 mg | ORAL_TABLET | ORAL | Status: DC
  Administered 2017-02-22: 100 mg via ORAL
  Filled 2017-02-22 (×2): qty 1

## 2017-02-22 MED ORDER — METOPROLOL TARTRATE 12.5 MG HALF TABLET
12.5000 mg | ORAL_TABLET | Freq: Two times a day (BID) | ORAL | Status: DC
  Administered 2017-02-22 – 2017-02-23 (×3): 12.5 mg via ORAL
  Filled 2017-02-22 (×3): qty 1

## 2017-02-22 MED ORDER — METOPROLOL TARTRATE 12.5 MG HALF TABLET: 12.5000 mg | ORAL_TABLET | Freq: Two times a day (BID) | ORAL | Status: DC | PRN

## 2017-02-22 MED ORDER — ONDANSETRON HCL 4 MG/2ML IJ SOLN
4.0000 mg | Freq: Once | INTRAMUSCULAR | Status: AC
  Administered 2017-02-22: 4 mg via INTRAVENOUS
  Filled 2017-02-22: qty 2

## 2017-02-22 NOTE — Progress Notes (Signed)
Subjective: Tiffany Golden is significantly better this morning, much less confused and loopy, able to engage in conversation and answer questions. She remains somewhat distractible with slowed memory but this may be baseline. Tolerating her second inpatient HD session reasonably well. She reports feeling fatigued with some mild lower back pain. When asked about her HIV medications she reported she has not have to follow up with ID for refills as she has a "stockpile" of extra meds at home.   Objective: Vital signs in last 24 hours: Vitals:   02/22/17 0930 02/22/17 1000 02/22/17 1005 02/22/17 1030  BP: (!) 125/91 (!) 87/61 (!) 88/63 (P) 101/80  Pulse: 88 71 70 (!) (P) 105  Resp: 18 18 17  (P) 18  Temp:      TempSrc:      SpO2:      Weight:      Height:        Intake/Output Summary (Last 24 hours) at 02/22/17 1059 Last data filed at 02/21/17 1831  Gross per 24 hour  Intake              120 ml  Output             2930 ml  Net            -2810 ml    Physical Exam General appearance: Obese woman resting comfortably in HD bed, in no distress, conversational and pleasant HEENT: Normocephalic, atraumatic, proptosis bilaterally Cardiovascular: Tachycardic rate and regular rhythm, no murmurs, rubs, gallops Respiratory: Clear to auscultation anteriorly, normal work of breathing Abdomen: Obese, BS+, soft, non-tender, non-distended Extremities: No edema, warm and well-perfused Neuro: Alert and oriented but with poor attention and some cognitive slowing Psych: Positive affect  Labs / Imaging / Procedures: CBC Latest Ref Rng & Units 02/22/2017 02/21/2017 02/21/2017  WBC 4.0 - 10.5 K/uL 8.9 - 12.7(H)  Hemoglobin 12.0 - 15.0 g/dL 12.2 11.6(L) 11.7(L)  Hematocrit 36.0 - 46.0 % 36.0 34.0(L) 34.4(L)  Platelets 150 - 400 K/uL 375 - 345   BMP Latest Ref Rng & Units 02/22/2017 02/21/2017 02/21/2017  Glucose 65 - 99 mg/dL 78 80 81  BUN 6 - 20 mg/dL 25(H) 44(H) 45(H)  Creatinine 0.44 - 1.00 mg/dL 7.59(H)  13.70(H) 13.12(H)  Sodium 135 - 145 mmol/L 134(L) 135 138  Potassium 3.5 - 5.1 mmol/L 4.6 4.9 5.0  Chloride 101 - 111 mmol/L 92(L) 101 96(L)  CO2 22 - 32 mmol/L 24 - 21(L)  Calcium 8.9 - 10.3 mg/dL 9.2 - 9.6   No results found.  Assessment/Plan: Tiffany Golden is a 41 y.o. woman with PMH ESRD, HIV, lumbago admitted for AMS likely related to Baclofen.   Principal Problem:   Altered mental status Active Problems:   HIV (human immunodeficiency virus infection) ([place])   ESRD on hemodialysis ([place])  Altered mental status, improving, likely due to recently prescribed opiate and muscle relaxant, and missed HD day prior to admit. Her Rx for Baclofen 10 mg was TID (not prn) and Tramadol 50 mg was Q6H (not prn) and she may have been taking them scheduled. No focal neuro deficits on exam, mental status is improving with HD and clearance of these medications but may not be back to baseline. 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 Jimmy Footman and HD at [facility], followed with [facility] Transplant team and has rejected three potential donors. Presenting with creatinine 13.1, BUN 45, and CO2 21 and AG 21. -- Inpatient HD 4/12 and 4/13, then per schedule  -- Continue home calcitriol and cinacalcet  Proptosis, apparent on exam -- Check TSH  HIV, last CD4 400  and VL 32 in 06/2015, previously followed with [facility]/ Dr. Tommy Medal, on Abacavir, Dolutegravir, and Lamivudine. Last filled Rx in early Dec 2017 per her pharmacy. Her mention of stockpiles of extra medications at home is very concerning for noncompliance.  -- Check HIV 1 RNA quant and CD4 count -- Ensure follow up with ID  HTN, at presentation  up to 180/103, recently missed HD, on Metoprolol 12.5 BID at home -- Inpatient HD -- Resume home Metoprolol after HD  FEN/GI: Renal diet, replete electrolytes as needed  DVT ppx: Heparin TID  Dispo: Anticipated discharge in approximately 0-1 day(s).   LOS: 0 days   Asencion Partridge, MD 02/22/2017, 10:59 AM Pager: [phone]

## 2017-02-22 NOTE — Consult Note (Addendum)
Date of Admission:  02/21/2017  Date of Consult:  02/22/2017  Reason for Consult: HIV disease with recentdrop in CD4 Referring Physician: Dr. Damita Dunnings   HPI: Tiffany Golden is an 41 y.o. female with HIV that had been previously perfectly controlled on a regimen of TIVICAY, Epivir and Epzicom. She had been not seen by me since 2016 but was able to access medications in the interim. She last filled her prescriptions of TIVICAY Epivir and Epzicom in December. She then became quite depressed when she lost her boyfriend who died after being killed in a motor vehicle accident by a drunk driver. This caused her to grieve profoundly. In addition to being her boyfriend he was also the only human besides her parents and him she had confided her HIV diagnosis. She now had no one to talk to. She states that at home she still had a good supply of both Epzicom and Epivir and that she continue to take these medications but not the [place] when her last refill ran out in January. In the interim she has been hospitalized to the teaching service in the context of acute encephalopathy to be due to excess baclofen and (in the context of chronic kidney disease and the patient being on hemodialysis, and of tramadol.  While inpatient her CD4 count was sent off and it came back at 41 year old percent was actually higher than when previously checked.  Given that she has been on only 2 effective drugs and they have both been nuclear types for the last 3 months I am very concerned that she may have virological failure with resistance.  Past Medical History:  Diagnosis Date  . Anemia   . Chronic kidney disease   . Complication of anesthesia   . Dialysis patient [facility])    mon, wed 15  . ESRD (end stage renal disease) ([place])   . HIV infection ([place])   . Hypertension   . PONV (postoperative nausea and vomiting)   . Stroke ([place])   . TIA (transient ischemic attack)    Hx:of    Past Surgical History:  Procedure  Laterality Date  . ARTERIOVENOUS GRAFT PLACEMENT  09/11/11   left arm  . CESAREAN SECTION    . DILITATION & CURRETTAGE/HYSTROSCOPY WITH NOVASURE ABLATION N/A 05/03/2015   Procedure: DILATATION/HYSTEROSCOPY WITH NOVASURE ABLATION; uterine cavity length 5.0 cm, uterine cavity width 3.8 cm, power 105 watts; time 1 minute 12 seconds;  Surgeon: Jonnie Kind, MD;  Location: [facility];  Service: Gynecology;  Laterality: N/A;  . INSERTION OF DIALYSIS CATHETER Right 09/01/2013   Procedure: INSERTION OF DIALYSIS CATHETER Right Internal Jugular;  Surgeon: Elam Dutch, MD;  Location: [place];  Service: Vascular;  Laterality: Right;  . PATCH ANGIOPLASTY Left 09/01/2013   Procedure: PATCH ANGIOPLASTY;  Surgeon: Elam Dutch, MD;  Location: [place];  Service: Vascular;  Laterality: Left;  . REVISON OF ARTERIOVENOUS FISTULA Left 01/77/9390   Procedure: Plication left arm fistula;  Surgeon: Elam Dutch, MD;  Location: [place];  Service: Vascular;  Laterality: Left;    Social History:  reports that she quit smoking about 7 years ago. Her smoking use included Cigarettes. She has a 1.50 pack-year smoking history. She has never used smokeless tobacco. She reports that she does not drink alcohol or use drugs.   Family History  Problem Relation Age of Onset  . Hyperlipidemia Mother   . Cancer - Other Mother     Hx partial  hysterectomy  . Heart disease Father     Hx CABG    Allergies  Allergen Reactions  . Fortaz [Ceftazidime Sodium In D5w] Rash    Head-toe  . Sulfa Antibiotics Hives  . Vancomycin Rash    Head-toe     Medications: I have reviewed patients current medications as documented in Epic Anti-infectives    Start     Dose/Rate Route Frequency Ordered Stop   02/22/17 1800  dapsone tablet 100 mg     100 mg Oral Every 24 hours 02/22/17 1720     02/21/17 2200  abacavir (ZIAGEN) tablet 600 mg  Status:  Discontinued     600 mg Oral Daily at bedtime 02/21/17 1518 02/22/17 1710   02/21/17  1715  lamiVUDine (EPIVIR) 10 MG/ML solution 25 mg  Status:  Discontinued     25 mg Oral Daily 02/21/17 1703 02/22/17 1710   02/21/17 1715  dolutegravir (TIVICAY) tablet 50 mg  Status:  Discontinued     50 mg Oral Daily 02/21/17 1703 02/22/17 1710         ROS as in HPI otherwise remainder of 12 point Review of Systems is negative    Blood pressure 123/90, pulse (!) 113, temperature 98.6 F (37 C), temperature source Oral, resp. rate 16, height 5' 1" (1.549 m), weight 187 lb 2.7 oz (84.9 kg), last menstrual period 05/01/2015, SpO2 97 %. General: Alert and awake, oriented x3, not in any acute distress, became quite tearful as we discussed the loss of her boyfriend, and her coping with her HIV diagnosis.  HEENT: anicteric sclera,  EOMI, oropharynx clear and without exudate Cardiovascular: egular rate, normal r,  no murmur rubs or gallops Pulmonary: clear to auscultation bilaterally, no wheezing, rales or rhonchi Gastrointestinal: soft nontender, nondistended, normal bowel sounds, Musculoskeletal: no  clubbing or edema noted bilaterally Skin, soft tissue: no rashes Neuro: nonfocal, strength and sensation intact   Results for orders placed or performed during the hospital encounter of 02/21/17 (from the past 48 hour(s))  CBG monitoring, ED     Status: None   Collection Time: 02/21/17  9:29 AM  Result Value Ref Range   Glucose-Capillary 71 65 - 99 mg/dL  Comprehensive metabolic panel     Status: Abnormal   Collection Time: 02/21/17  9:33 AM  Result Value Ref Range   Sodium 138 135 - 145 mmol/L   Potassium 5.0 3.5 - 5.1 mmol/L   Chloride 96 (L) 101 - 111 mmol/L   CO2 21 (L) 22 - 32 mmol/L   Glucose, Bld 81 65 - 99 mg/dL   BUN 45 (H) 6 - 20 mg/dL   Creatinine, Ser 13.12 (H) 0.44 - 1.00 mg/dL   Calcium 9.6 8.9 - 10.3 mg/dL   Total Protein 7.6 6.5 - 8.1 g/dL   Albumin 2.9 (L) 3.5 - 5.0 g/dL   AST 14 (L) 15 - 41 U/L   ALT 11 (L) 14 - 54 U/L   Alkaline Phosphatase 48 38 - 126 U/L    Total Bilirubin 0.9 0.3 - 1.2 mg/dL   GFR calc non Af Amer 3 (L) >60 mL/min   GFR calc Af Amer 4 (L) >60 mL/min    Comment: (NOTE) The eGFR has been calculated using the CKD EPI equation. This calculation has not been validated in all clinical situations. eGFR's persistently <60 mL/min signify possible Chronic Kidney Disease.    Anion gap 21 (H) 5 - 15  CBC     Status: Abnormal  Collection Time: 02/21/17  9:33 AM  Result Value Ref Range   WBC 12.7 (H) 4.0 - 10.5 K/uL   RBC 3.75 (L) 3.87 - 5.11 MIL/uL   Hemoglobin 11.7 (L) 12.0 - 15.0 g/dL   HCT 34.4 (L) 36.0 - 46.0 %   MCV 91.7 78.0 - 100.0 fL   MCH 31.2 26.0 - 34.0 pg   MCHC 34.0 30.0 - 36.0 g/dL   RDW 13.8 11.5 - 15.5 %   Platelets 345 150 - 400 K/uL  Ammonia     Status: None   Collection Time: 02/21/17  9:33 AM  Result Value Ref Range   Ammonia 27 9 - 35 umol/L  I-Stat Chem 8, ED     Status: Abnormal   Collection Time: 02/21/17  9:38 AM  Result Value Ref Range   Sodium 135 135 - 145 mmol/L   Potassium 4.9 3.5 - 5.1 mmol/L   Chloride 101 101 - 111 mmol/L   BUN 44 (H) 6 - 20 mg/dL   Creatinine, Ser 13.70 (H) 0.44 - 1.00 mg/dL   Glucose, Bld 80 65 - 99 mg/dL   Calcium, Ion 1.02 (L) 1.15 - 1.40 mmol/L   TCO2 26 0 - 100 mmol/L   Hemoglobin 11.6 (L) 12.0 - 15.0 g/dL   HCT 34.0 (L) 36.0 - 46.0 %  HIV antibody (Routine Testing)     Status: Abnormal   Collection Time: 02/21/17 12:52 PM  Result Value Ref Range   HIV Screen 4th Generation wRfx Reactive (A) Non Reactive    Comment: (NOTE)                  Client Requested Flag See additional algorithm testing elsewhere in this report. Performed At: [facility] [place], [place] [id number] Lindon Romp MD [id number]   HIV 1/2 Ab Differentiation     Status: Abnormal   Collection Time: 02/21/17 12:52 PM  Result Value Ref Range   HIV 1 Ab Positive (A) Negative    Comment:                   Client Requested Flag   HIV 2 Ab Negative Negative    Note SEE COMMENT     Comment: (NOTE) RESULT:HIV-1 Positive Laboratory evidence consistent with established HIV-1 infection is present. report emailed _0  02/22/17 Performed At: [facility] [place], [place] [id number] Lindon Romp MD [id number]   Glucose, capillary     Status: None   Collection Time: 02/21/17  6:30 PM  Result Value Ref Range   Glucose-Capillary 85 65 - 99 mg/dL  T-helper cells (CD4) count (not at [facility])     Status: Abnormal   Collection Time: 02/21/17  7:22 PM  Result Value Ref Range   CD4 T Cell Abs 180 (L) 400 - 2,700 /uL   CD4 % Helper T Cell 22 (L) 33 - 55 %    Comment: Performed at [facility], [place] [address] [place], [place] [address]  MRSA PCR Screening     Status: None   Collection Time: 02/22/17  3:57 AM  Result Value Ref Range   MRSA by PCR NEGATIVE NEGATIVE    Comment:        The GeneXpert MRSA Assay (FDA approved for NASAL specimens only), is one component of a comprehensive MRSA colonization surveillance program. It is not intended to diagnose MRSA infection nor to guide or monitor treatment for MRSA infections.   CBC  Status: None   Collection Time: 02/22/17  5:20 AM  Result Value Ref Range   WBC 8.9 4.0 - 10.5 K/uL   RBC 3.93 3.87 - 5.11 MIL/uL   Hemoglobin 12.2 12.0 - 15.0 g/dL   HCT 36.0 36.0 - 46.0 %   MCV 91.6 78.0 - 100.0 fL   MCH 31.0 26.0 - 34.0 pg   MCHC 33.9 30.0 - 36.0 g/dL   RDW 14.0 11.5 - 15.5 %   Platelets 375 150 - 400 K/uL  Renal function panel     Status: Abnormal   Collection Time: 02/22/17  5:20 AM  Result Value Ref Range   Sodium 134 (L) 135 - 145 mmol/L   Potassium 4.6 3.5 - 5.1 mmol/L   Chloride 92 (L) 101 - 111 mmol/L   CO2 24 22 - 32 mmol/L   Glucose, Bld 78 65 - 99 mg/dL   BUN 25 (H) 6 - 20 mg/dL   Creatinine, Ser 7.59 (H) 0.44 - 1.00 mg/dL    Comment: DELTA CHECK NOTED DIALYSIS    Calcium 9.2 8.9 - 10.3 mg/dL   Phosphorus 6.3 (H) 2.5 - 4.6 mg/dL     Albumin 2.8 (L) 3.5 - 5.0 g/dL   GFR calc non Af Amer 6 (L) >60 mL/min   GFR calc Af Amer 7 (L) >60 mL/min    Comment: (NOTE) The eGFR has been calculated using the CKD EPI equation. This calculation has not been validated in all clinical situations. eGFR's persistently <60 mL/min signify possible Chronic Kidney Disease.    Anion gap 18 (H) 5 - 15  TSH     Status: None   Collection Time: 02/22/17 12:05 PM  Result Value Ref Range   TSH 2.551 0.350 - 4.500 uIU/mL    Comment: Performed by a 3rd Generation assay with a functional sensitivity of <=0.01 uIU/mL.   _0 (sdes,specrequest,cult,reptstatus)   ) Recent Results (from the past 720 hour(s))  MRSA PCR Screening     Status: None   Collection Time: 02/22/17  3:57 AM  Result Value Ref Range Status   MRSA by PCR NEGATIVE NEGATIVE Final    Comment:        The GeneXpert MRSA Assay (FDA approved for NASAL specimens only), is one component of a comprehensive MRSA colonization surveillance program. It is not intended to diagnose MRSA infection nor to guide or monitor treatment for MRSA infections.      Impression/Recommendation  Principal Problem:   Altered mental status Active Problems:   HIV (human immunodeficiency virus infection) ([place])   ESRD on hemodialysis ([place])   Jose Alleyne Heckman is a 41 y.o. female with  HIV and end-stage renal disease on hemodialysis previously perfectly controlled on TIVICAY Epivir and Epzicom but who is only been on 2 nucleotide's for the past 3 months when she could not have her TIVICAY filled at her pharmacy.  #1 HIV: Perhaps we may "get lucky" finding that she was able to suppress her virus on 2 nucleotide's for several months. I am however very concerned that she may have failed with virological resistance to one or more of her nucleotide components. I'm sending HIV ultra quantitative viral load with reflex to genotype. I will also send and integrase genotype with am labs though if she  is being truthful there would not be reason to suspect R to the INSTI  IF she has somehow not failed with R I will change her to STR such as TRIUMEQ or BIKTARVY. There is data from  several studies showing that NOT adjusting the 3TC and FTC in ESRD patients does not result in significant side effects or toxicities to the patient and I would rather ensure an "all or none approach" by treating her with a single tablet regimen. If she has failed with resistance we will have to put together a salvage regimen with for example [place], [place]. In looking back through her records there is mention from [facility] note that patient had been on AZT and 3TC alone which would have been quite suboptimal. We may want to get a genosure archive on her also in the future when she re-suppresses  For now we will stop ALL of her ARV  We'll start dapsone 100 mg daily due to the fact that her CD4 count is dropped below 200 and we are concerned that this is because of urological failure with potential resistance.  In future we will change her meds from [facility] to [facility]   #2 depression with grieving the loss of her boyfriend and need for support. When she comes to clinic will make sure that she has an appoint with [place] sure one of our counselors I've also mentioned [place] to her and work that [employer] does there.   Will arrange for her to be seen in our clinic for follow-up likely with ID pharmacy Dimitri Ped on a Tuesday am) and then with me. I am hoping she did not fail with resistance.  #3 ESRD on HD: He was being evaluated by the transplant team there and that was why she was changed him weekly Viread to abacavir. Hopefully can get her re-suppressed on her viral load and then plugged back into transplant team at [facility].   I will sign off for now  Please call with further questions.   02/22/2017, 7:04 PM   Thank you so much for this interesting consult  [place] for  [place] [phone] (pager) [phone] (office) 02/22/2017, 7:04 PM  Rhina [place] 02/22/2017, 7:04 PM

## 2017-02-22 NOTE — Progress Notes (Signed)
Patients HR is maintaing in the 120's-130's. MD notified. MD stated to re-check B/P and call back. B/P is 120/78 and HR is 129. MD stated to give metoprolol. Orders followed. Will continue to monitor.

## 2017-02-22 NOTE — Procedures (Signed)
Pt seen on HD.  Ap 210  Vp 310  BFR 450.  Ox3, mentally much improved.  When gets back to room she needs to get out of bed and ambulate.

## 2017-02-22 NOTE — Care Management Obs Status (Signed)
[place] NOTIFICATION   Patient Details  Name: GENESI STEFANKO MRN: [id number] Date of Birth: September 23, 1976   Medicare Observation Status Notification Given:  Yes    Rowen Hur, Rory Percy, RN 02/22/2017, 5:32 PM

## 2017-02-23 ENCOUNTER — Other Ambulatory Visit: Payer: Self-pay | Admitting: Internal Medicine

## 2017-02-23 DIAGNOSIS — F4320 Adjustment disorder, unspecified: Secondary | ICD-10-CM

## 2017-02-23 LAB — RENAL FUNCTION PANEL
ALBUMIN: 3.1 g/dL — AB (ref 3.5–5.0)
Anion gap: 18 — ABNORMAL HIGH (ref 5–15)
BUN: 32 mg/dL — AB (ref 6–20)
CHLORIDE: 89 mmol/L — AB (ref 101–111)
CO2: 27 mmol/L (ref 22–32)
CREATININE: 6.76 mg/dL — AB (ref 0.44–1.00)
Calcium: 9.9 mg/dL (ref 8.9–10.3)
GFR calc Af Amer: 8 mL/min — ABNORMAL LOW (ref >60)
GFR, EST NON AFRICAN AMERICAN: 7 mL/min — AB (ref >60)
GLUCOSE: 95 mg/dL (ref 65–99)
PHOSPHORUS: 5.7 mg/dL — AB (ref 2.5–4.6)
POTASSIUM: 4 mmol/L (ref 3.5–5.1)
Sodium: 134 mmol/L — ABNORMAL LOW (ref 135–145)

## 2017-02-23 MED ORDER — POLYETHYLENE GLYCOL 3350 17 G PO PACK
17.0000 g | PACK | Freq: Two times a day (BID) | ORAL | Status: DC | PRN
Start: 2017-02-23 — End: 2017-02-23

## 2017-02-23 MED ORDER — DAPSONE 100 MG PO TABS: 100.0000 mg | ORAL_TABLET | ORAL | 0 refills | Status: DC

## 2017-02-23 MED ORDER — SENNOSIDES-DOCUSATE SODIUM 8.6-50 MG PO TABS: 2.0000 | ORAL_TABLET | Freq: Two times a day (BID) | ORAL | Status: DC

## 2017-02-23 MED ORDER — CALCIUM CARBONATE ANTACID 500 MG PO CHEW: 2.0000 | CHEWABLE_TABLET | Freq: Three times a day (TID) | ORAL | Status: DC

## 2017-02-23 MED ORDER — SENNOSIDES-DOCUSATE SODIUM 8.6-50 MG PO TABS: 2.0000 | ORAL_TABLET | Freq: Two times a day (BID) | ORAL | Status: DC | PRN

## 2017-02-23 NOTE — Progress Notes (Addendum)
Subjective:  No acute events overnight. Feels well. No n/v. Good po intake. Knows to follow up with ID and currently on dapsone.  Objective: Vital signs in last 24 hours: Vitals:   02/22/17 1734 02/22/17 2107 02/23/17 0450 02/23/17 0857  BP: 123/90 119/82 113/68 125/80  Pulse: (!) 113 (!) 120 (!) 102 98  Resp: 16 18 17 18   Temp: 98.6 F (37 C) 99.1 F (37.3 C) 99 F (37.2 C) 98.6 F (37 C)  TempSrc: Oral Oral Oral Oral  SpO2: 97% 100% 96% 96%  Weight:  187 lb 9.8 oz (85.1 kg)    Height:        Intake/Output Summary (Last 24 hours) at 02/23/17 1038 Last data filed at 02/23/17 0946  Gross per 24 hour  Intake              366 ml  Output             2500 ml  Net            -2134 ml    Physical Exam General appearance: Obese woman resting comfortably, in no distress, conversational and pleasant HEENT: Normocephalic, atraumatic, proptosis bilaterally Cardiovascular: reg rate and rhythm, no murmurs, rubs, gallops Respiratory: Clear to auscultation anteriorly, normal work of breathing Abdomen: Obese, BS+, soft, non-tender, non-distended Extremities: No edema  Labs / Imaging / Procedures: CBC Latest Ref Rng & Units 02/22/2017 02/21/2017 02/21/2017  WBC 4.0 - 10.5 K/uL 8.9 - 12.7(H)  Hemoglobin 12.0 - 15.0 g/dL 12.2 11.6(L) 11.7(L)  Hematocrit 36.0 - 46.0 % 36.0 34.0(L) 34.4(L)  Platelets 150 - 400 K/uL 375 - 345   BMP Latest Ref Rng & Units 02/23/2017 02/22/2017 02/21/2017  Glucose 65 - 99 mg/dL 95 78 80  BUN 6 - 20 mg/dL 32(H) 25(H) 44(H)  Creatinine 0.44 - 1.00 mg/dL 6.76(H) 7.59(H) 13.70(H)  Sodium 135 - 145 mmol/L 134(L) 134(L) 135  Potassium 3.5 - 5.1 mmol/L 4.0 4.6 4.9  Chloride 101 - 111 mmol/L 89(L) 92(L) 101  CO2 22 - 32 mmol/L 27 24 -  Calcium 8.9 - 10.3 mg/dL 9.9 9.2 -   No results found.  Assessment/Plan: Ms. Tiffany Golden is a 41 y.o. woman with PMH ESRD, HIV, lumbago admitted for AMS likely related to Baclofen.   Principal Problem:   Altered mental  status Active Problems:   HIV (human immunodeficiency virus infection) ([place])   ESRD on hemodialysis ([place])   Patient non adherence   Grieving   Moderate single current episode of major depressive disorder ([place])  Altered mental status:  Resolved. Likely baclofen  induced.   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 Jimmy Footman and HD [facility], followed with [facility] Transplant team and has rejected three potential donors. Presenting with creatinine 13.1, BUN 45, and CO2 21 and AG 21. Had dialysis on 4/13.   -- Continue home calcitriol and cinacalcet -resume home dialysis    HIV, last CD4 400  and VL 32 in 06/2015, previously followed with [facility]/ Dr. Tommy Medal, on Abacavir, Dolutegravir, and Lamivudine. Last filled Rx in early Dec 2017 per her pharmacy. CD4 this admission 180  -Appreciate ID recs -off home meds, started Dapsone -HIV ultraquant pending.   HTN, at presentation up to 180/103, recently missed HD, on Metoprolol 12.5 BID at home  -- Metoprolol after HD  FEN/GI: Renal diet, replete electrolytes as needed  DVT ppx: Heparin TID  Dispo: Anticipated discharge in approximately 0-1 day(s).   LOS: 1 day   Burgess Estelle, MD 02/23/2017, 10:38 AM

## 2017-02-23 NOTE — Discharge Instructions (Signed)
It was a pleasure taking care of you Please start taking the dapsone daily Please stop the baclofen and the tramadol, as well as your prior HIV medicines  Please resume your dialysis sessions Please follow up with Dr Tommy Medal and call to go on Tuesday, as well as follow up with your PCP.

## 2017-02-23 NOTE — Progress Notes (Signed)
[facility] Progress Note   Dialysis Orders: Monday/Wednesday/Friday, [facility] 4 hours, 180 dialyzer, blood flow rate 450, dialysate flow 800, estimated dry weight 90.5 kg, 2K/2 calcium, and no UF profile, no sodium modeling. Left upper arm brachiocephalic fistula. Heparin 5000 unit bolus, Venofer 50 mg weekly, calcitriol 0.75 g 3 times a week, Sensipar 60 mg 3 times a week.  Assessment/Plan: 1. AMS- likely iatrogenic related to meds- baclofen contraindicated in ESRD pt for the most part plus interaction with tramadol- back to baseline 2. ESRD - MWF - next HD Monday K 4 3. Anemia - hgb 12.2 - no ESA - weekly Fe 4. Secondary hyperparathyroidism - Ca high at 9.9 P 5.7 -  sensipar/calcitriol/tums 2 ac for binders 5. HTN/volume - net UF Friday 2.6 with post wt 84.9- this is significantly below outpt HD; MTP 12.5 bid; dialysis weights bed scale;  she said weight last pm was standing at 85.1 - lower EDW for d/c 6. Nutrition -alb 3.1- unintentional weight loss secondary to not eating due to grief from loss of boyfriend  7. HIV- per ID CD4  180; encouraged her to check out [place] after d/c 8. Disp - prob d/c today  Myriam Jacobson, PA-C [place] [phone] 02/23/2017,8:37 AM  LOS: 1 day  I have seen and examined this patient and agree with plan per Amalia Hailey.  She is back to baseline and is ready for DC.   Addalee Kavanagh T,MD 02/23/2017 9:30 AM Subjective:   No problems with dialysis.  Still grieving the loss of boyfriend  Objective Vitals:   02/22/17 1346 02/22/17 1734 02/22/17 2107 02/23/17 0450  BP: 120/78 123/90 119/82 113/68  Pulse: (!) 129 (!) 113 (!) 120 (!) 102  Resp:  16 18 17   Temp:  98.6 F (37 C) 99.1 F (37.3 C) 99 F (37.2 C)  TempSrc:  Oral Oral Oral  SpO2:  97% 100% 96%  Weight:   85.1 kg (187 lb 9.8 oz)   Height:       Physical Exam General: NAD - tearful at times Heart: tachy reg Lungs: no  rales  Abdomen: soft NT Extremities: no LE edema Dialysis Access: left BC AVF + bruit   Additional Objective Labs: Basic Metabolic Panel:  Recent Labs Lab 02/21/17 0933 02/21/17 0938 02/22/17 0520 02/23/17 0414  NA 138 135 134* 134*  K 5.0 4.9 4.6 4.0  CL 96* 101 92* 89*  CO2 21*  --  24 27  GLUCOSE 81 80 78 95  BUN 45* 44* 25* 32*  CREATININE 13.12* 13.70* 7.59* 6.76*  CALCIUM 9.6  --  9.2 9.9  PHOS  --   --  6.3* 5.7*   Liver Function Tests:  Recent Labs Lab 02/21/17 0933 02/22/17 0520 02/23/17 0414  AST 14*  --   --   ALT 11*  --   --   ALKPHOS 48  --   --   BILITOT 0.9  --   --   PROT 7.6  --   --   ALBUMIN 2.9* 2.8* 3.1*   CBC:  Recent Labs Lab 02/21/17 0933 02/21/17 0938 02/22/17 0520  WBC 12.7*  --  8.9  HGB 11.7* 11.6* 12.2  HCT 34.4* 34.0* 36.0  MCV 91.7  --  91.6  PLT 345  --  375   CBG:  Recent Labs Lab 02/21/17 0929 02/21/17 1830  GLUCAP 71 85   Lab Results  Component Value Date   INR  0.98 05/03/2015   INR 0.91 04/04/2013   Studies/Results: Ct Head Wo Contrast  Result Date: 02/21/2017 CLINICAL DATA:  41 year old female with a history of altered mental status EXAM: CT HEAD WITHOUT CONTRAST TECHNIQUE: Contiguous axial images were obtained from the base of the skull through the vertex without intravenous contrast. COMPARISON:  CT 04/04/2013, MR 04/05/2013 FINDINGS: Brain: No acute intracranial hemorrhage. No midline shift or mass effect. Gray-white differentiation maintained. Unremarkable appearance of the ventricular system. Vascular: Unremarkable. Skull: No acute fracture.  No aggressive bone lesion identified. Sinuses/Orbits: Unremarkable appearance of the orbits. Mastoid air cells clear. No middle ear effusion. No significant sinus disease. Other: None IMPRESSION: No CT evidence of acute intracranial abnormality. Electronically Signed   By: Corrie Mckusick D.O.   On: 02/21/2017 10:12   Medications:  . calcitRIOL  0.75 mcg Oral Q  M,W,F  . cinacalcet  60 mg Oral Q M,W,F  . dapsone  100 mg Oral Q24H  . heparin  5,000 Units Subcutaneous Q8H  . metoprolol tartrate  12.5 mg Oral BID  . senna-docusate  1 tablet Oral QHS  . sodium chloride flush  3 mL Intravenous Q12H

## 2017-02-23 NOTE — Progress Notes (Signed)
Tiffany Golden to be D/C'd Home per MD order.  Discussed prescriptions and follow up appointments with the patient. Prescriptions given to patient, medication list explained in detail. Pt verbalized understanding.  Allergies as of 02/23/2017      Reactions   Fortaz [ceftazidime Sodium In D5w] Rash   Head-toe   Sulfa Antibiotics Hives   Vancomycin Rash   Head-toe      Medication List    STOP taking these medications   abacavir 300 MG tablet Commonly known as:  ZIAGEN   baclofen 10 MG tablet Commonly known as:  LIORESAL   dolutegravir 50 MG tablet Commonly known as:  TIVICAY   ketorolac 10 MG tablet Commonly known as:  TORADOL   lamiVUDine 10 MG/ML solution Commonly known as:  EPIVIR   naproxen sodium 220 MG tablet Commonly known as:  ANAPROX   traMADol 50 MG tablet Commonly known as:  ULTRAM     TAKE these medications   albuterol 108 (90 Base) MCG/ACT inhaler Commonly known as:  PROVENTIL HFA;VENTOLIN HFA Inhale 2 puffs into the lungs every 4 (four) hours as needed for wheezing.   calcium carbonate 500 MG chewable tablet Commonly known as:  TUMS - dosed in mg elemental calcium Chew 1-2 tablets by mouth daily as needed for indigestion or heartburn.   dapsone 100 MG tablet Take 1 tablet (100 mg total) by mouth daily.   metoprolol tartrate 25 MG tablet Commonly known as:  LOPRESSOR Take 12.5 mg by mouth 2 (two) times daily as needed (for blood pressure). Except takes none on dialysis days (Tuesday, Thursday, and Sunday).   RENA-VITE RX 1 MG Tabs Take 1 tablet by mouth daily.       Vitals:   02/23/17 0450 02/23/17 0857  BP: 113/68 125/80  Pulse: (!) 102 98  Resp: 17 18  Temp: 99 F (37.2 C) 98.6 F (37 C)    Skin clean, dry and intact without evidence of skin break down, no evidence of skin tears noted. IV catheter discontinued intact. Site without signs and symptoms of complications. Dressing and pressure applied. Pt denies pain at this time. No complaints  noted.  An After Visit Summary was printed and given to the patient. Patient escorted via [place], and D/C home via private auto.  Emilio Math, RN [facility] Phone [phone]
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
## 2017-02-24 LAB — HCV RNA QUANT RFLX ULTRA OR GENOTYP
HCV RNA QNT(LOG COPY/ML): UNDETERMINED {Log_IU}/mL
HEPATITIS C QUANTITATION: NOT DETECTED [IU]/mL

## 2017-02-25 ENCOUNTER — Telehealth: Payer: Self-pay

## 2017-02-25 DIAGNOSIS — N186 End stage renal disease: Secondary | ICD-10-CM | POA: Diagnosis not present

## 2017-02-25 DIAGNOSIS — N2581 Secondary hyperparathyroidism of renal origin: Secondary | ICD-10-CM | POA: Diagnosis not present

## 2017-02-25 DIAGNOSIS — D509 Iron deficiency anemia, unspecified: Secondary | ICD-10-CM | POA: Diagnosis not present

## 2017-02-25 LAB — HIV ANTIBODY (ROUTINE TESTING W REFLEX): HIV Screen 4th Generation wRfx: REACTIVE — AB

## 2017-02-25 NOTE — Telephone Encounter (Signed)
Lab called positve HIV 1 antibody result

## 2017-02-25 NOTE — Telephone Encounter (Signed)
Thank you. This is known. Patient well established with Dr. Tommy Medal for HIV treatment.

## 2017-02-26 LAB — REFLEX TO GENOSURE(R) MG
HIV GENOSURE(R): UNDETERMINED
HIV GENOSURE: UNDETERMINED
HIV GenoSure(R) MG PDF: UNDETERMINED

## 2017-02-26 LAB — HIV-1 RNA ULTRAQUANT REFLEX TO GENTYP+
HIV-1 RNA BY PCR: 8180 copies/mL
HIV-1 RNA QUANT, LOG: 3.913 {Log_copies}/mL

## 2017-02-27 DIAGNOSIS — N2581 Secondary hyperparathyroidism of renal origin: Secondary | ICD-10-CM | POA: Diagnosis not present

## 2017-02-27 DIAGNOSIS — D509 Iron deficiency anemia, unspecified: Secondary | ICD-10-CM | POA: Diagnosis not present

## 2017-02-27 DIAGNOSIS — N186 End stage renal disease: Secondary | ICD-10-CM | POA: Diagnosis not present

## 2017-03-01 DIAGNOSIS — N186 End stage renal disease: Secondary | ICD-10-CM | POA: Diagnosis not present

## 2017-03-01 DIAGNOSIS — N2581 Secondary hyperparathyroidism of renal origin: Secondary | ICD-10-CM | POA: Diagnosis not present

## 2017-03-01 DIAGNOSIS — D509 Iron deficiency anemia, unspecified: Secondary | ICD-10-CM | POA: Diagnosis not present

## 2017-03-04 ENCOUNTER — Ambulatory Visit (HOSPITAL_COMMUNITY)
Admission: RE | Admit: 2017-03-04 | Discharge: 2017-03-04 | Disposition: A | Discharge status: Home / Self Care | Payer: Medicare Other | Source: Ambulatory Visit | Attending: Anesthesiology | Admitting: Anesthesiology

## 2017-03-04 ENCOUNTER — Other Ambulatory Visit (HOSPITAL_COMMUNITY): Payer: Self-pay | Admitting: Anesthesiology

## 2017-03-04 DIAGNOSIS — N186 End stage renal disease: Secondary | ICD-10-CM | POA: Diagnosis not present

## 2017-03-04 DIAGNOSIS — M545 Low back pain: Secondary | ICD-10-CM | POA: Diagnosis not present

## 2017-03-04 DIAGNOSIS — M47817 Spondylosis without myelopathy or radiculopathy, lumbosacral region: Secondary | ICD-10-CM | POA: Insufficient documentation

## 2017-03-04 DIAGNOSIS — N2581 Secondary hyperparathyroidism of renal origin: Secondary | ICD-10-CM | POA: Diagnosis not present

## 2017-03-04 DIAGNOSIS — M47819 Spondylosis without myelopathy or radiculopathy, site unspecified: Secondary | ICD-10-CM

## 2017-03-04 DIAGNOSIS — D509 Iron deficiency anemia, unspecified: Secondary | ICD-10-CM | POA: Diagnosis not present

## 2017-03-05 ENCOUNTER — Ambulatory Visit (INDEPENDENT_AMBULATORY_CARE_PROVIDER_SITE_OTHER): Payer: Medicare Other | Admitting: Infectious Disease

## 2017-03-05 ENCOUNTER — Encounter: Payer: Self-pay | Admitting: Infectious Disease

## 2017-03-05 VITALS — BP 125/85 | HR 89 | Temp 97.6°F | Wt 195.0 lb

## 2017-03-05 DIAGNOSIS — B20 Human immunodeficiency virus [HIV] disease: Secondary | ICD-10-CM | POA: Diagnosis not present

## 2017-03-05 DIAGNOSIS — R110 Nausea: Secondary | ICD-10-CM

## 2017-03-05 DIAGNOSIS — F321 Major depressive disorder, single episode, moderate: Secondary | ICD-10-CM

## 2017-03-05 DIAGNOSIS — N186 End stage renal disease: Secondary | ICD-10-CM

## 2017-03-05 DIAGNOSIS — Z992 Dependence on renal dialysis: Secondary | ICD-10-CM

## 2017-03-05 MED ORDER — ONDANSETRON 4 MG PO TBDP: 4.0000 mg | ORAL_TABLET | Freq: Three times a day (TID) | ORAL | 0 refills | Status: DC | PRN

## 2017-03-05 MED ORDER — DARUNAVIR-COBICISTAT 800-150 MG PO TABS: 1.0000 | ORAL_TABLET | Freq: Every day | ORAL | 5 refills | Status: DC

## 2017-03-05 MED ORDER — BICTEGRAVIR-EMTRICITAB-TENOFOV 50-200-25 MG PO TABS: 1.0000 | ORAL_TABLET | Freq: Every day | ORAL | 5 refills | Status: DC

## 2017-03-05 MED ORDER — DAPSONE 100 MG PO TABS: 100.0000 mg | ORAL_TABLET | Freq: Every day | ORAL | 5 refills | Status: DC
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
## 2017-03-06 DIAGNOSIS — D509 Iron deficiency anemia, unspecified: Secondary | ICD-10-CM | POA: Diagnosis not present

## 2017-03-06 DIAGNOSIS — N2581 Secondary hyperparathyroidism of renal origin: Secondary | ICD-10-CM | POA: Diagnosis not present

## 2017-03-06 DIAGNOSIS — N186 End stage renal disease: Secondary | ICD-10-CM | POA: Diagnosis not present

## 2017-03-07 ENCOUNTER — Encounter: Payer: Self-pay | Admitting: Family Medicine

## 2017-03-07 ENCOUNTER — Ambulatory Visit (INDEPENDENT_AMBULATORY_CARE_PROVIDER_SITE_OTHER): Payer: Medicare Other | Admitting: Family Medicine

## 2017-03-07 VITALS — BP 118/82 | Ht 61.0 in | Wt 193.0 lb

## 2017-03-07 DIAGNOSIS — M545 Low back pain, unspecified: Secondary | ICD-10-CM

## 2017-03-07 DIAGNOSIS — Z131 Encounter for screening for diabetes mellitus: Secondary | ICD-10-CM

## 2017-03-07 DIAGNOSIS — E782 Mixed hyperlipidemia: Secondary | ICD-10-CM

## 2017-03-07 NOTE — Patient Instructions (Signed)

## 2017-03-07 NOTE — Progress Notes (Signed)
   Subjective:    Patient ID: Tiffany Golden, female    DOB: 09-13-1976, 41 y.o.   MRN: [id number]  HPI Patient is here today for a hospital follow up. Patient was admitted to [facility] on 02/21/17 for an allergic reaction to Baclofen. Patient is doing a lot better. Patient has no other concerns at this time.  She was on a muscle relaxer cause some alertness and focus issues in hallucinations she and up in the hospital with it no longer on this medicine. She is here today for follow-up She is suffering with some mild depression seen a counselor for this not suicidal Review of Systems Denies chest tightness pressure pain shortness breath vomiting diarrhea   she does see a back specialist to get injections and is on tramadol Objective:   Physical Exam  Lungs are clear hearts regular low back subjective tenderness extremities no edema  Patient gives female health checkups every 3 years that gynecology    Assessment & Plan:  Lumbar pain exercises shown will print front seen specialist for injection Hyperlipidemia low-fat diet healthy exercise check lab work Glucose screening Avoid muscle relaxers Depression screening patient is seen counselor currently for depression regarding the loss of her boyfriend Follow-up here when necessary

## 2017-03-08 DIAGNOSIS — N2581 Secondary hyperparathyroidism of renal origin: Secondary | ICD-10-CM | POA: Diagnosis not present

## 2017-03-08 DIAGNOSIS — D509 Iron deficiency anemia, unspecified: Secondary | ICD-10-CM | POA: Diagnosis not present

## 2017-03-08 DIAGNOSIS — N186 End stage renal disease: Secondary | ICD-10-CM | POA: Diagnosis not present

## 2017-03-10 LAB — HIV RNA, RTPCR W/R GT (RTI, PI,INT)
HIV-1 RNA, QN PCR: 2590 {copies}/mL — AB
HIV-1 RNA, QN PCR: 3.41 {Log_copies}/mL — AB

## 2017-03-11 DIAGNOSIS — Z992 Dependence on renal dialysis: Secondary | ICD-10-CM | POA: Diagnosis not present

## 2017-03-11 DIAGNOSIS — M545 Low back pain: Secondary | ICD-10-CM | POA: Diagnosis not present

## 2017-03-11 DIAGNOSIS — N186 End stage renal disease: Secondary | ICD-10-CM | POA: Diagnosis not present

## 2017-03-11 DIAGNOSIS — D509 Iron deficiency anemia, unspecified: Secondary | ICD-10-CM | POA: Diagnosis not present

## 2017-03-11 DIAGNOSIS — N2581 Secondary hyperparathyroidism of renal origin: Secondary | ICD-10-CM | POA: Diagnosis not present

## 2017-03-11 DIAGNOSIS — M47817 Spondylosis without myelopathy or radiculopathy, lumbosacral region: Secondary | ICD-10-CM | POA: Diagnosis not present

## 2017-03-11 DIAGNOSIS — I120 Hypertensive chronic kidney disease with stage 5 chronic kidney disease or end stage renal disease: Secondary | ICD-10-CM | POA: Diagnosis not present

## 2017-03-11 MED FILL — PREZCOBIX 800 MG-150 MG TAB: 800-150 | 30 days supply | Qty: 30 | Fill #0

## 2017-03-11 MED FILL — ONDANSETRON ODT 4 MG TABLET: 4 | 6 days supply | Qty: 20 | Fill #0

## 2017-03-11 MED FILL — BIKTARVY 50-200-25 MG TABS: 50-200-25 | 30 days supply | Qty: 30 | Fill #0

## 2017-03-11 NOTE — Progress Notes (Signed)
HPI: Tiffany Golden is a 41 y.o. female who presents to the [facility] clinic today for follow-up with Dr. Tommy Medal for her HIV infection.   Allergies: Allergies  Allergen Reactions  . Fortaz [Ceftazidime Sodium In D5w] Rash    Head-toe  . Sulfa Antibiotics Hives  . Vancomycin Rash    Head-toe    Past Medical History: Past Medical History:  Diagnosis Date  . Anemia   . Chronic kidney disease   . Complication of anesthesia   . Dialysis patient [facility])    mon, wed 69  . ESRD (end stage renal disease) ([place])   . HIV infection ([place])   . Hypertension   . PONV (postoperative nausea and vomiting)   . Stroke ([place])   . TIA (transient ischemic attack)    Hx:of    Social History: Social History   Social History  . Marital status: Single    Spouse name: N/A  . Number of children: N/A  . Years of education: N/A   Social History Main Topics  . Smoking status: Former Smoker    Packs/day: 0.50    Years: 3.00    Types: Cigarettes    Quit date: 09/05/2009  . Smokeless tobacco: Never Used  . Alcohol use No  . Drug use: No  . Sexual activity: Not Currently    Partners: Male     Comment: declined condoms   Other Topics Concern  . None   Social History Narrative  . None    Current Regimen: Tivicay + Abacavir + Lamivudine  Labs: HIV 1 RNA Quant (copies/mL)  Date Value  06/30/2015 32 (H)  12/30/2014 78 (H)  08/03/2014 <20   CD4 T Cell Abs (/uL)  Date Value  02/21/2017 180 (L)  06/30/2015 400  12/30/2014 500   Hep B S Ab (no units)  Date Value  03/26/2013 REACTIVE (A)   Hepatitis B Surface Ag (no units)  Date Value  03/26/2013 NEGATIVE   HCV Ab (no units)  Date Value  08/03/2014 NEGATIVE    CrCl: Estimated Creatinine Clearance: 11 mL/min (A) (by C-G formula based on SCr of 6.76 mg/dL (H)).  Lipids:    Component Value Date/Time   CHOL 224 (H) 06/30/2015 1027   TRIG 103 06/30/2015 1027   HDL 54 06/30/2015 1027   CHOLHDL 4.1 06/30/2015 1027   VLDL 21  06/30/2015 1027   LDLCALC 149 (H) 06/30/2015 1027    Assessment: Tiffany Golden is here today to follow-up for her HIV infection. She has ESRD on HD MWF. She has had HIV for a long time - approximately diagnosed 18 years ago.  She has been on several regimens including some clinical trials back in the 90s and early 2000s. She was recently on daily tivicay, abacavir, and lamivudine. She has only been taking abacavir and lamivudine for the last couple of months she tells [place] because [facility] has not been filling her tivicay.  She had a viral load drawn in the hospital in the middle of April that was over 8,000. No genotype was able to be done due to insufficient amount of blood drawn.  We are worried about resistance so we will start her on Biktarvy + Prezcobix and send them to [facility] and transfer the rest of her medications from [place] to [facility].  We got resistance labs drawn again today. She will come back and see me in ~1 month. Inez Catalina was able to get [employer] approved through a prior auth and co pay assistance for her  through PAF.  Plans: - Stop tivicay, abacavir, and lamivudine - Start Biktarvy 1 pill PO once daily - Start Prezcobix 1 pill PO once daily - HIV VL and resistance labs today - Send meds to [facility] - F/u with me 5/24 at [place]. Kuppelweiser, PharmD, [place] for Infectious Disease 03/11/2017, 9:55 AM

## 2017-03-13 DIAGNOSIS — N186 End stage renal disease: Secondary | ICD-10-CM | POA: Diagnosis not present

## 2017-03-13 DIAGNOSIS — N2581 Secondary hyperparathyroidism of renal origin: Secondary | ICD-10-CM | POA: Diagnosis not present

## 2017-03-13 DIAGNOSIS — D509 Iron deficiency anemia, unspecified: Secondary | ICD-10-CM | POA: Diagnosis not present

## 2017-03-13 DIAGNOSIS — D631 Anemia in chronic kidney disease: Secondary | ICD-10-CM | POA: Diagnosis not present

## 2017-03-14 DIAGNOSIS — D631 Anemia in chronic kidney disease: Secondary | ICD-10-CM | POA: Diagnosis not present

## 2017-03-14 DIAGNOSIS — D509 Iron deficiency anemia, unspecified: Secondary | ICD-10-CM | POA: Diagnosis not present

## 2017-03-14 DIAGNOSIS — N2581 Secondary hyperparathyroidism of renal origin: Secondary | ICD-10-CM | POA: Diagnosis not present

## 2017-03-14 DIAGNOSIS — N186 End stage renal disease: Secondary | ICD-10-CM | POA: Diagnosis not present

## 2017-03-14 LAB — HIV-1 GENOTYPE: HIV-1 Genotype: DETECTED — AB

## 2017-03-14 LAB — RFLX HIV-1 INTEGRASE GENOTYPE

## 2017-03-18 DIAGNOSIS — N2581 Secondary hyperparathyroidism of renal origin: Secondary | ICD-10-CM | POA: Diagnosis not present

## 2017-03-18 DIAGNOSIS — N186 End stage renal disease: Secondary | ICD-10-CM | POA: Diagnosis not present

## 2017-03-18 DIAGNOSIS — D631 Anemia in chronic kidney disease: Secondary | ICD-10-CM | POA: Diagnosis not present

## 2017-03-18 DIAGNOSIS — D509 Iron deficiency anemia, unspecified: Secondary | ICD-10-CM | POA: Diagnosis not present

## 2017-03-20 DIAGNOSIS — N186 End stage renal disease: Secondary | ICD-10-CM | POA: Diagnosis not present

## 2017-03-20 DIAGNOSIS — D509 Iron deficiency anemia, unspecified: Secondary | ICD-10-CM | POA: Diagnosis not present

## 2017-03-20 DIAGNOSIS — D631 Anemia in chronic kidney disease: Secondary | ICD-10-CM | POA: Diagnosis not present

## 2017-03-20 DIAGNOSIS — N2581 Secondary hyperparathyroidism of renal origin: Secondary | ICD-10-CM | POA: Diagnosis not present

## 2017-03-22 DIAGNOSIS — D509 Iron deficiency anemia, unspecified: Secondary | ICD-10-CM | POA: Diagnosis not present

## 2017-03-22 DIAGNOSIS — N2581 Secondary hyperparathyroidism of renal origin: Secondary | ICD-10-CM | POA: Diagnosis not present

## 2017-03-22 DIAGNOSIS — D631 Anemia in chronic kidney disease: Secondary | ICD-10-CM | POA: Diagnosis not present

## 2017-03-22 DIAGNOSIS — N186 End stage renal disease: Secondary | ICD-10-CM | POA: Diagnosis not present

## 2017-03-25 ENCOUNTER — Other Ambulatory Visit: Payer: Self-pay | Admitting: Internal Medicine

## 2017-03-25 DIAGNOSIS — N2581 Secondary hyperparathyroidism of renal origin: Secondary | ICD-10-CM | POA: Diagnosis not present

## 2017-03-25 DIAGNOSIS — D631 Anemia in chronic kidney disease: Secondary | ICD-10-CM | POA: Diagnosis not present

## 2017-03-25 DIAGNOSIS — N186 End stage renal disease: Secondary | ICD-10-CM | POA: Diagnosis not present

## 2017-03-25 DIAGNOSIS — D509 Iron deficiency anemia, unspecified: Secondary | ICD-10-CM | POA: Diagnosis not present

## 2017-03-27 DIAGNOSIS — D631 Anemia in chronic kidney disease: Secondary | ICD-10-CM | POA: Diagnosis not present

## 2017-03-27 DIAGNOSIS — D509 Iron deficiency anemia, unspecified: Secondary | ICD-10-CM | POA: Diagnosis not present

## 2017-03-27 DIAGNOSIS — N186 End stage renal disease: Secondary | ICD-10-CM | POA: Diagnosis not present

## 2017-03-27 DIAGNOSIS — N2581 Secondary hyperparathyroidism of renal origin: Secondary | ICD-10-CM | POA: Diagnosis not present

## 2017-03-29 DIAGNOSIS — D509 Iron deficiency anemia, unspecified: Secondary | ICD-10-CM | POA: Diagnosis not present

## 2017-03-29 DIAGNOSIS — N186 End stage renal disease: Secondary | ICD-10-CM | POA: Diagnosis not present

## 2017-03-29 DIAGNOSIS — N2581 Secondary hyperparathyroidism of renal origin: Secondary | ICD-10-CM | POA: Diagnosis not present

## 2017-03-29 DIAGNOSIS — D631 Anemia in chronic kidney disease: Secondary | ICD-10-CM | POA: Diagnosis not present

## 2017-04-01 DIAGNOSIS — D509 Iron deficiency anemia, unspecified: Secondary | ICD-10-CM | POA: Diagnosis not present

## 2017-04-01 DIAGNOSIS — N2581 Secondary hyperparathyroidism of renal origin: Secondary | ICD-10-CM | POA: Diagnosis not present

## 2017-04-01 DIAGNOSIS — D631 Anemia in chronic kidney disease: Secondary | ICD-10-CM | POA: Diagnosis not present

## 2017-04-01 DIAGNOSIS — M545 Low back pain: Secondary | ICD-10-CM | POA: Diagnosis not present

## 2017-04-01 DIAGNOSIS — M47817 Spondylosis without myelopathy or radiculopathy, lumbosacral region: Secondary | ICD-10-CM | POA: Diagnosis not present

## 2017-04-01 DIAGNOSIS — N186 End stage renal disease: Secondary | ICD-10-CM | POA: Diagnosis not present

## 2017-04-03 DIAGNOSIS — D509 Iron deficiency anemia, unspecified: Secondary | ICD-10-CM | POA: Diagnosis not present

## 2017-04-03 DIAGNOSIS — N186 End stage renal disease: Secondary | ICD-10-CM | POA: Diagnosis not present

## 2017-04-03 DIAGNOSIS — D631 Anemia in chronic kidney disease: Secondary | ICD-10-CM | POA: Diagnosis not present

## 2017-04-03 DIAGNOSIS — N2581 Secondary hyperparathyroidism of renal origin: Secondary | ICD-10-CM | POA: Diagnosis not present

## 2017-04-04 ENCOUNTER — Ambulatory Visit: Payer: Medicare Other

## 2017-04-05 DIAGNOSIS — D509 Iron deficiency anemia, unspecified: Secondary | ICD-10-CM | POA: Diagnosis not present

## 2017-04-05 DIAGNOSIS — N2581 Secondary hyperparathyroidism of renal origin: Secondary | ICD-10-CM | POA: Diagnosis not present

## 2017-04-05 DIAGNOSIS — N186 End stage renal disease: Secondary | ICD-10-CM | POA: Diagnosis not present

## 2017-04-05 DIAGNOSIS — D631 Anemia in chronic kidney disease: Secondary | ICD-10-CM | POA: Diagnosis not present

## 2017-04-08 DIAGNOSIS — D631 Anemia in chronic kidney disease: Secondary | ICD-10-CM | POA: Diagnosis not present

## 2017-04-08 DIAGNOSIS — N186 End stage renal disease: Secondary | ICD-10-CM | POA: Diagnosis not present

## 2017-04-08 DIAGNOSIS — D509 Iron deficiency anemia, unspecified: Secondary | ICD-10-CM | POA: Diagnosis not present

## 2017-04-08 DIAGNOSIS — N2581 Secondary hyperparathyroidism of renal origin: Secondary | ICD-10-CM | POA: Diagnosis not present

## 2017-04-09 ENCOUNTER — Other Ambulatory Visit: Payer: Self-pay | Admitting: Infectious Disease

## 2017-04-09 ENCOUNTER — Ambulatory Visit: Payer: Medicare Other

## 2017-04-09 DIAGNOSIS — R110 Nausea: Secondary | ICD-10-CM

## 2017-04-09 MED FILL — PREZCOBIX 800 MG-150 MG TAB: 800-150 | 30 days supply | Qty: 30 | Fill #1

## 2017-04-09 MED FILL — BIKTARVY 50-200-25 MG TABS: 50-200-25 | 30 days supply | Qty: 30 | Fill #1

## 2017-04-10 DIAGNOSIS — D631 Anemia in chronic kidney disease: Secondary | ICD-10-CM | POA: Diagnosis not present

## 2017-04-10 DIAGNOSIS — N2581 Secondary hyperparathyroidism of renal origin: Secondary | ICD-10-CM | POA: Diagnosis not present

## 2017-04-10 DIAGNOSIS — N186 End stage renal disease: Secondary | ICD-10-CM | POA: Diagnosis not present

## 2017-04-10 DIAGNOSIS — D509 Iron deficiency anemia, unspecified: Secondary | ICD-10-CM | POA: Diagnosis not present

## 2017-04-10 MED FILL — ONDANSETRON ODT 4 MG TABLET: 4 | 6 days supply | Qty: 20 | Fill #0

## 2017-04-11 ENCOUNTER — Ambulatory Visit (INDEPENDENT_AMBULATORY_CARE_PROVIDER_SITE_OTHER): Payer: Medicare Other | Admitting: Pharmacist

## 2017-04-11 DIAGNOSIS — I120 Hypertensive chronic kidney disease with stage 5 chronic kidney disease or end stage renal disease: Secondary | ICD-10-CM | POA: Diagnosis not present

## 2017-04-11 DIAGNOSIS — B20 Human immunodeficiency virus [HIV] disease: Secondary | ICD-10-CM | POA: Diagnosis not present

## 2017-04-11 DIAGNOSIS — Z992 Dependence on renal dialysis: Secondary | ICD-10-CM | POA: Diagnosis not present

## 2017-04-11 DIAGNOSIS — N186 End stage renal disease: Secondary | ICD-10-CM | POA: Diagnosis not present

## 2017-04-11 NOTE — Progress Notes (Signed)
HPI: Tiffany Golden is a 41 y.o. female who presents to the [place] clinic for follow-up of her HIV infection.   Allergies: Allergies  Allergen Reactions  . Fortaz [Ceftazidime Sodium In D5w] Rash    Head-toe  . Sulfa Antibiotics Hives  . Vancomycin Rash    Head-toe    Past Medical History: Past Medical History:  Diagnosis Date  . Anemia   . Chronic kidney disease   . Complication of anesthesia   . Dialysis patient [facility])    mon, wed 11  . ESRD (end stage renal disease) ([place])   . HIV infection ([place])   . Hypertension   . PONV (postoperative nausea and vomiting)   . Stroke ([place])   . TIA (transient ischemic attack)    Hx:of    Social History: Social History   Social History  . Marital status: Single    Spouse name: N/A  . Number of children: N/A  . Years of education: N/A   Social History Main Topics  . Smoking status: Former Smoker    Packs/day: 0.50    Years: 3.00    Types: Cigarettes    Quit date: 09/05/2009  . Smokeless tobacco: Never Used  . Alcohol use No  . Drug use: No  . Sexual activity: Not Currently    Partners: Male     Comment: declined condoms   Other Topics Concern  . Not on file   Social History Narrative  . No narrative on file    Current Regimen: Biktarvy + Prezcobix  Labs: HIV 1 RNA Quant (copies/mL)  Date Value  06/30/2015 32 (H)  12/30/2014 78 (H)  08/03/2014 <20   CD4 T Cell Abs (/uL)  Date Value  02/21/2017 180 (Golden)  06/30/2015 400  12/30/2014 500   Hep B S Ab (no units)  Date Value  03/26/2013 REACTIVE (A)   Hepatitis B Surface Ag (no units)  Date Value  03/26/2013 NEGATIVE   HCV Ab (no units)  Date Value  08/03/2014 NEGATIVE    CrCl: CrCl cannot be calculated (Patient's most recent lab result is older than the maximum 21 days allowed.).  Lipids:    Component Value Date/Time   CHOL 224 (H) 06/30/2015 1027   TRIG 103 06/30/2015 1027   HDL 54 06/30/2015 1027   CHOLHDL 4.1 06/30/2015 1027   VLDL  21 06/30/2015 1027   LDLCALC 149 (H) 06/30/2015 1027    Assessment: Tiffany Golden is here today for HIV follow-up.  She recently saw Dr. Tommy Medal ~1 month ago after not being on all of her HIV medications for a few months.  She was supposed to be on abcavair, lamivudine, and tivicay (she has ESRD on HD), but was not taking her tivicay due to [facility] not filling the medication.  She was only taking lamviduine and abacavir. 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  She has been on the new medications for a month and says they are working well for her.  She is still getting nauseous sometimes when taking them.  She tried taking it 30 mins before taking her HIV medications, but said it did not work and takes them at the same time now.  She says it is getting better.  She has the 4 mg Zofran ODT, so I told her she could double up and take 2 (8 mg total) with her medications.  I will repeat labs  today.  I emphasized the importance of adherence to her again and told her to call me with any issues.  I will make sure she has follow-up with Dr. Tommy Medal.  She said she did not get a dapsone refill after requesting one a few weeks ago.  I told her I would get a CD4 today and see what it was and refill if needed.  Plans: - Continue Biktarvy and Prezcobix - HIV VL and CD4 today - F/u with Dr. Tommy Medal  Tiffany Golden. Buna Cuppett, PharmD, [place] for Infectious Disease 04/11/2017, 3:39 PM

## 2017-04-12 DIAGNOSIS — N2581 Secondary hyperparathyroidism of renal origin: Secondary | ICD-10-CM | POA: Diagnosis not present

## 2017-04-12 DIAGNOSIS — N186 End stage renal disease: Secondary | ICD-10-CM | POA: Diagnosis not present

## 2017-04-12 DIAGNOSIS — D509 Iron deficiency anemia, unspecified: Secondary | ICD-10-CM | POA: Diagnosis not present

## 2017-04-12 DIAGNOSIS — D631 Anemia in chronic kidney disease: Secondary | ICD-10-CM | POA: Diagnosis not present

## 2017-04-12 LAB — T-HELPER CELL (CD4) - (RCID CLINIC ONLY)
CD4 % Helper T Cell: 27 % — ABNORMAL LOW (ref 33–55)
CD4 T Cell Abs: 520 /uL (ref 400–2700)

## 2017-04-13 LAB — HIV-1 RNA QUANT-NO REFLEX-BLD
HIV 1 RNA Quant: 1820 copies/mL — ABNORMAL HIGH
HIV-1 RNA QUANT, LOG: 3.26 {Log_copies}/mL — AB

## 2017-04-15 ENCOUNTER — Other Ambulatory Visit: Payer: Self-pay | Admitting: Infectious Disease

## 2017-04-15 DIAGNOSIS — B20 Human immunodeficiency virus [HIV] disease: Secondary | ICD-10-CM

## 2017-04-15 DIAGNOSIS — N186 End stage renal disease: Secondary | ICD-10-CM | POA: Diagnosis not present

## 2017-04-15 DIAGNOSIS — D631 Anemia in chronic kidney disease: Secondary | ICD-10-CM | POA: Diagnosis not present

## 2017-04-15 DIAGNOSIS — N2581 Secondary hyperparathyroidism of renal origin: Secondary | ICD-10-CM | POA: Diagnosis not present

## 2017-04-15 DIAGNOSIS — D509 Iron deficiency anemia, unspecified: Secondary | ICD-10-CM | POA: Diagnosis not present

## 2017-04-15 NOTE — Progress Notes (Signed)
She is seeing pharmacy also and I put a note to pharmacist.

## 2017-04-15 NOTE — Progress Notes (Signed)
Pt should have HIV ultraquant reflex to genotype when she comes for visit with [facility] tomorrow

## 2017-04-15 NOTE — Progress Notes (Signed)
Ok perfect

## 2017-04-15 NOTE — Progress Notes (Signed)
Yea I threw her on my schedule so we don't miss her

## 2017-04-16 ENCOUNTER — Ambulatory Visit: Payer: Medicare Other

## 2017-04-16 ENCOUNTER — Ambulatory Visit: Payer: Medicare Other | Admitting: Licensed Clinical Social Worker

## 2017-04-16 NOTE — Progress Notes (Signed)
She did not show up today.  I will try to get a hold of her to come in for labs.

## 2017-04-17 DIAGNOSIS — N186 End stage renal disease: Secondary | ICD-10-CM | POA: Diagnosis not present

## 2017-04-17 DIAGNOSIS — D631 Anemia in chronic kidney disease: Secondary | ICD-10-CM | POA: Diagnosis not present

## 2017-04-17 DIAGNOSIS — N2581 Secondary hyperparathyroidism of renal origin: Secondary | ICD-10-CM | POA: Diagnosis not present

## 2017-04-17 DIAGNOSIS — D509 Iron deficiency anemia, unspecified: Secondary | ICD-10-CM | POA: Diagnosis not present

## 2017-04-17 NOTE — Progress Notes (Signed)
Thanks Cassie 

## 2017-04-18 ENCOUNTER — Telehealth: Payer: Self-pay | Admitting: Licensed Clinical Social Worker

## 2017-04-18 NOTE — Telephone Encounter (Signed)
Attempted to contact patient due to missed appointment on 6/5.  Left message asking for return phone call.  Sande Rives, [facility]

## 2017-04-19 DIAGNOSIS — N2581 Secondary hyperparathyroidism of renal origin: Secondary | ICD-10-CM | POA: Diagnosis not present

## 2017-04-19 DIAGNOSIS — D509 Iron deficiency anemia, unspecified: Secondary | ICD-10-CM | POA: Diagnosis not present

## 2017-04-19 DIAGNOSIS — D631 Anemia in chronic kidney disease: Secondary | ICD-10-CM | POA: Diagnosis not present

## 2017-04-19 DIAGNOSIS — N186 End stage renal disease: Secondary | ICD-10-CM | POA: Diagnosis not present

## 2017-04-22 DIAGNOSIS — D631 Anemia in chronic kidney disease: Secondary | ICD-10-CM | POA: Diagnosis not present

## 2017-04-22 DIAGNOSIS — D509 Iron deficiency anemia, unspecified: Secondary | ICD-10-CM | POA: Diagnosis not present

## 2017-04-22 DIAGNOSIS — N186 End stage renal disease: Secondary | ICD-10-CM | POA: Diagnosis not present

## 2017-04-22 DIAGNOSIS — N2581 Secondary hyperparathyroidism of renal origin: Secondary | ICD-10-CM | POA: Diagnosis not present

## 2017-04-24 DIAGNOSIS — D509 Iron deficiency anemia, unspecified: Secondary | ICD-10-CM | POA: Diagnosis not present

## 2017-04-24 DIAGNOSIS — N186 End stage renal disease: Secondary | ICD-10-CM | POA: Diagnosis not present

## 2017-04-24 DIAGNOSIS — N2581 Secondary hyperparathyroidism of renal origin: Secondary | ICD-10-CM | POA: Diagnosis not present

## 2017-04-24 DIAGNOSIS — D631 Anemia in chronic kidney disease: Secondary | ICD-10-CM | POA: Diagnosis not present

## 2017-04-26 DIAGNOSIS — D509 Iron deficiency anemia, unspecified: Secondary | ICD-10-CM | POA: Diagnosis not present

## 2017-04-26 DIAGNOSIS — N2581 Secondary hyperparathyroidism of renal origin: Secondary | ICD-10-CM | POA: Diagnosis not present

## 2017-04-26 DIAGNOSIS — N186 End stage renal disease: Secondary | ICD-10-CM | POA: Diagnosis not present

## 2017-04-26 DIAGNOSIS — D631 Anemia in chronic kidney disease: Secondary | ICD-10-CM | POA: Diagnosis not present

## 2017-04-29 DIAGNOSIS — D631 Anemia in chronic kidney disease: Secondary | ICD-10-CM | POA: Diagnosis not present

## 2017-04-29 DIAGNOSIS — N186 End stage renal disease: Secondary | ICD-10-CM | POA: Diagnosis not present

## 2017-04-29 DIAGNOSIS — N2581 Secondary hyperparathyroidism of renal origin: Secondary | ICD-10-CM | POA: Diagnosis not present

## 2017-04-29 DIAGNOSIS — D509 Iron deficiency anemia, unspecified: Secondary | ICD-10-CM | POA: Diagnosis not present

## 2017-05-01 DIAGNOSIS — D509 Iron deficiency anemia, unspecified: Secondary | ICD-10-CM | POA: Diagnosis not present

## 2017-05-01 DIAGNOSIS — D631 Anemia in chronic kidney disease: Secondary | ICD-10-CM | POA: Diagnosis not present

## 2017-05-01 DIAGNOSIS — N2581 Secondary hyperparathyroidism of renal origin: Secondary | ICD-10-CM | POA: Diagnosis not present

## 2017-05-01 DIAGNOSIS — N186 End stage renal disease: Secondary | ICD-10-CM | POA: Diagnosis not present

## 2017-05-03 DIAGNOSIS — D631 Anemia in chronic kidney disease: Secondary | ICD-10-CM | POA: Diagnosis not present

## 2017-05-03 DIAGNOSIS — N2581 Secondary hyperparathyroidism of renal origin: Secondary | ICD-10-CM | POA: Diagnosis not present

## 2017-05-03 DIAGNOSIS — D509 Iron deficiency anemia, unspecified: Secondary | ICD-10-CM | POA: Diagnosis not present

## 2017-05-03 DIAGNOSIS — N186 End stage renal disease: Secondary | ICD-10-CM | POA: Diagnosis not present

## 2017-05-06 DIAGNOSIS — N2581 Secondary hyperparathyroidism of renal origin: Secondary | ICD-10-CM | POA: Diagnosis not present

## 2017-05-06 DIAGNOSIS — N186 End stage renal disease: Secondary | ICD-10-CM | POA: Diagnosis not present

## 2017-05-06 DIAGNOSIS — D631 Anemia in chronic kidney disease: Secondary | ICD-10-CM | POA: Diagnosis not present

## 2017-05-06 DIAGNOSIS — D509 Iron deficiency anemia, unspecified: Secondary | ICD-10-CM | POA: Diagnosis not present

## 2017-05-08 DIAGNOSIS — D509 Iron deficiency anemia, unspecified: Secondary | ICD-10-CM | POA: Diagnosis not present

## 2017-05-08 DIAGNOSIS — N186 End stage renal disease: Secondary | ICD-10-CM | POA: Diagnosis not present

## 2017-05-08 DIAGNOSIS — N2581 Secondary hyperparathyroidism of renal origin: Secondary | ICD-10-CM | POA: Diagnosis not present

## 2017-05-08 DIAGNOSIS — D631 Anemia in chronic kidney disease: Secondary | ICD-10-CM | POA: Diagnosis not present

## 2017-05-09 ENCOUNTER — Other Ambulatory Visit: Payer: Self-pay | Admitting: Infectious Disease

## 2017-05-09 DIAGNOSIS — R110 Nausea: Secondary | ICD-10-CM

## 2017-05-09 MED FILL — ONDANSETRON ODT 4 MG TABLET: 4 | 6 days supply | Qty: 20 | Fill #0

## 2017-05-09 MED FILL — BIKTARVY 50-200-25 MG TABS: 50-200-25 | 30 days supply | Qty: 30 | Fill #2

## 2017-05-09 MED FILL — PREZCOBIX 800 MG-150 MG TAB: 800-150 | 30 days supply | Qty: 30 | Fill #2

## 2017-05-10 DIAGNOSIS — N2581 Secondary hyperparathyroidism of renal origin: Secondary | ICD-10-CM | POA: Diagnosis not present

## 2017-05-10 DIAGNOSIS — N186 End stage renal disease: Secondary | ICD-10-CM | POA: Diagnosis not present

## 2017-05-10 DIAGNOSIS — D509 Iron deficiency anemia, unspecified: Secondary | ICD-10-CM | POA: Diagnosis not present

## 2017-05-10 DIAGNOSIS — D631 Anemia in chronic kidney disease: Secondary | ICD-10-CM | POA: Diagnosis not present

## 2017-05-11 DIAGNOSIS — N186 End stage renal disease: Secondary | ICD-10-CM | POA: Diagnosis not present

## 2017-05-11 DIAGNOSIS — I120 Hypertensive chronic kidney disease with stage 5 chronic kidney disease or end stage renal disease: Secondary | ICD-10-CM | POA: Diagnosis not present

## 2017-05-11 DIAGNOSIS — Z992 Dependence on renal dialysis: Secondary | ICD-10-CM | POA: Diagnosis not present

## 2017-05-13 DIAGNOSIS — D509 Iron deficiency anemia, unspecified: Secondary | ICD-10-CM | POA: Diagnosis not present

## 2017-05-13 DIAGNOSIS — N2581 Secondary hyperparathyroidism of renal origin: Secondary | ICD-10-CM | POA: Diagnosis not present

## 2017-05-13 DIAGNOSIS — N186 End stage renal disease: Secondary | ICD-10-CM | POA: Diagnosis not present

## 2017-05-15 DIAGNOSIS — N2581 Secondary hyperparathyroidism of renal origin: Secondary | ICD-10-CM | POA: Diagnosis not present

## 2017-05-15 DIAGNOSIS — N186 End stage renal disease: Secondary | ICD-10-CM | POA: Diagnosis not present

## 2017-05-15 DIAGNOSIS — D509 Iron deficiency anemia, unspecified: Secondary | ICD-10-CM | POA: Diagnosis not present

## 2017-05-17 DIAGNOSIS — N186 End stage renal disease: Secondary | ICD-10-CM | POA: Diagnosis not present

## 2017-05-17 DIAGNOSIS — N2581 Secondary hyperparathyroidism of renal origin: Secondary | ICD-10-CM | POA: Diagnosis not present

## 2017-05-17 DIAGNOSIS — D509 Iron deficiency anemia, unspecified: Secondary | ICD-10-CM | POA: Diagnosis not present

## 2017-05-20 DIAGNOSIS — D509 Iron deficiency anemia, unspecified: Secondary | ICD-10-CM | POA: Diagnosis not present

## 2017-05-20 DIAGNOSIS — N186 End stage renal disease: Secondary | ICD-10-CM | POA: Diagnosis not present

## 2017-05-20 DIAGNOSIS — N2581 Secondary hyperparathyroidism of renal origin: Secondary | ICD-10-CM | POA: Diagnosis not present

## 2017-05-22 DIAGNOSIS — N2581 Secondary hyperparathyroidism of renal origin: Secondary | ICD-10-CM | POA: Diagnosis not present

## 2017-05-22 DIAGNOSIS — D509 Iron deficiency anemia, unspecified: Secondary | ICD-10-CM | POA: Diagnosis not present

## 2017-05-22 DIAGNOSIS — N186 End stage renal disease: Secondary | ICD-10-CM | POA: Diagnosis not present

## 2017-05-24 DIAGNOSIS — D509 Iron deficiency anemia, unspecified: Secondary | ICD-10-CM | POA: Diagnosis not present

## 2017-05-24 DIAGNOSIS — N186 End stage renal disease: Secondary | ICD-10-CM | POA: Diagnosis not present

## 2017-05-24 DIAGNOSIS — N2581 Secondary hyperparathyroidism of renal origin: Secondary | ICD-10-CM | POA: Diagnosis not present

## 2017-05-27 DIAGNOSIS — N2581 Secondary hyperparathyroidism of renal origin: Secondary | ICD-10-CM | POA: Diagnosis not present

## 2017-05-27 DIAGNOSIS — N186 End stage renal disease: Secondary | ICD-10-CM | POA: Diagnosis not present

## 2017-05-27 DIAGNOSIS — D509 Iron deficiency anemia, unspecified: Secondary | ICD-10-CM | POA: Diagnosis not present

## 2017-05-29 DIAGNOSIS — D509 Iron deficiency anemia, unspecified: Secondary | ICD-10-CM | POA: Diagnosis not present

## 2017-05-29 DIAGNOSIS — N2581 Secondary hyperparathyroidism of renal origin: Secondary | ICD-10-CM | POA: Diagnosis not present

## 2017-05-29 DIAGNOSIS — N186 End stage renal disease: Secondary | ICD-10-CM | POA: Diagnosis not present

## 2017-05-31 DIAGNOSIS — D509 Iron deficiency anemia, unspecified: Secondary | ICD-10-CM | POA: Diagnosis not present

## 2017-05-31 DIAGNOSIS — N2581 Secondary hyperparathyroidism of renal origin: Secondary | ICD-10-CM | POA: Diagnosis not present

## 2017-05-31 DIAGNOSIS — N186 End stage renal disease: Secondary | ICD-10-CM | POA: Diagnosis not present

## 2017-06-03 DIAGNOSIS — N2581 Secondary hyperparathyroidism of renal origin: Secondary | ICD-10-CM | POA: Diagnosis not present

## 2017-06-03 DIAGNOSIS — N186 End stage renal disease: Secondary | ICD-10-CM | POA: Diagnosis not present

## 2017-06-03 DIAGNOSIS — D509 Iron deficiency anemia, unspecified: Secondary | ICD-10-CM | POA: Diagnosis not present

## 2017-06-05 DIAGNOSIS — D509 Iron deficiency anemia, unspecified: Secondary | ICD-10-CM | POA: Diagnosis not present

## 2017-06-05 DIAGNOSIS — N186 End stage renal disease: Secondary | ICD-10-CM | POA: Diagnosis not present

## 2017-06-05 DIAGNOSIS — N2581 Secondary hyperparathyroidism of renal origin: Secondary | ICD-10-CM | POA: Diagnosis not present

## 2017-06-07 DIAGNOSIS — N186 End stage renal disease: Secondary | ICD-10-CM | POA: Diagnosis not present

## 2017-06-07 DIAGNOSIS — D509 Iron deficiency anemia, unspecified: Secondary | ICD-10-CM | POA: Diagnosis not present

## 2017-06-07 DIAGNOSIS — N2581 Secondary hyperparathyroidism of renal origin: Secondary | ICD-10-CM | POA: Diagnosis not present

## 2017-06-10 DIAGNOSIS — N2581 Secondary hyperparathyroidism of renal origin: Secondary | ICD-10-CM | POA: Diagnosis not present

## 2017-06-10 DIAGNOSIS — D509 Iron deficiency anemia, unspecified: Secondary | ICD-10-CM | POA: Diagnosis not present

## 2017-06-10 DIAGNOSIS — N186 End stage renal disease: Secondary | ICD-10-CM | POA: Diagnosis not present

## 2017-06-10 MED FILL — BIKTARVY 50-200-25 MG TABS: 50-200-25 | 30 days supply | Qty: 30 | Fill #3

## 2017-06-10 MED FILL — PREZCOBIX 800 MG-150 MG TAB: 800-150 | 30 days supply | Qty: 30 | Fill #3

## 2017-06-11 DIAGNOSIS — I120 Hypertensive chronic kidney disease with stage 5 chronic kidney disease or end stage renal disease: Secondary | ICD-10-CM | POA: Diagnosis not present

## 2017-06-11 DIAGNOSIS — Z992 Dependence on renal dialysis: Secondary | ICD-10-CM | POA: Diagnosis not present

## 2017-06-11 DIAGNOSIS — N186 End stage renal disease: Secondary | ICD-10-CM | POA: Diagnosis not present

## 2017-06-12 DIAGNOSIS — N2581 Secondary hyperparathyroidism of renal origin: Secondary | ICD-10-CM | POA: Diagnosis not present

## 2017-06-12 DIAGNOSIS — D509 Iron deficiency anemia, unspecified: Secondary | ICD-10-CM | POA: Diagnosis not present

## 2017-06-12 DIAGNOSIS — N186 End stage renal disease: Secondary | ICD-10-CM | POA: Diagnosis not present

## 2017-06-14 DIAGNOSIS — N186 End stage renal disease: Secondary | ICD-10-CM | POA: Diagnosis not present

## 2017-06-14 DIAGNOSIS — N2581 Secondary hyperparathyroidism of renal origin: Secondary | ICD-10-CM | POA: Diagnosis not present

## 2017-06-14 DIAGNOSIS — D509 Iron deficiency anemia, unspecified: Secondary | ICD-10-CM | POA: Diagnosis not present

## 2017-06-17 DIAGNOSIS — N186 End stage renal disease: Secondary | ICD-10-CM | POA: Diagnosis not present

## 2017-06-17 DIAGNOSIS — D509 Iron deficiency anemia, unspecified: Secondary | ICD-10-CM | POA: Diagnosis not present

## 2017-06-17 DIAGNOSIS — N2581 Secondary hyperparathyroidism of renal origin: Secondary | ICD-10-CM | POA: Diagnosis not present

## 2017-06-19 DIAGNOSIS — N186 End stage renal disease: Secondary | ICD-10-CM | POA: Diagnosis not present

## 2017-06-19 DIAGNOSIS — N2581 Secondary hyperparathyroidism of renal origin: Secondary | ICD-10-CM | POA: Diagnosis not present

## 2017-06-19 DIAGNOSIS — D509 Iron deficiency anemia, unspecified: Secondary | ICD-10-CM | POA: Diagnosis not present

## 2017-06-21 DIAGNOSIS — D509 Iron deficiency anemia, unspecified: Secondary | ICD-10-CM | POA: Diagnosis not present

## 2017-06-21 DIAGNOSIS — N2581 Secondary hyperparathyroidism of renal origin: Secondary | ICD-10-CM | POA: Diagnosis not present

## 2017-06-21 DIAGNOSIS — N186 End stage renal disease: Secondary | ICD-10-CM | POA: Diagnosis not present

## 2017-06-24 DIAGNOSIS — N186 End stage renal disease: Secondary | ICD-10-CM | POA: Diagnosis not present

## 2017-06-24 DIAGNOSIS — D509 Iron deficiency anemia, unspecified: Secondary | ICD-10-CM | POA: Diagnosis not present

## 2017-06-24 DIAGNOSIS — N2581 Secondary hyperparathyroidism of renal origin: Secondary | ICD-10-CM | POA: Diagnosis not present

## 2017-06-26 DIAGNOSIS — N2581 Secondary hyperparathyroidism of renal origin: Secondary | ICD-10-CM | POA: Diagnosis not present

## 2017-06-26 DIAGNOSIS — D509 Iron deficiency anemia, unspecified: Secondary | ICD-10-CM | POA: Diagnosis not present

## 2017-06-26 DIAGNOSIS — N186 End stage renal disease: Secondary | ICD-10-CM | POA: Diagnosis not present

## 2017-06-28 DIAGNOSIS — N2581 Secondary hyperparathyroidism of renal origin: Secondary | ICD-10-CM | POA: Diagnosis not present

## 2017-06-28 DIAGNOSIS — D509 Iron deficiency anemia, unspecified: Secondary | ICD-10-CM | POA: Diagnosis not present

## 2017-06-28 DIAGNOSIS — N186 End stage renal disease: Secondary | ICD-10-CM | POA: Diagnosis not present

## 2017-07-01 DIAGNOSIS — N2581 Secondary hyperparathyroidism of renal origin: Secondary | ICD-10-CM | POA: Diagnosis not present

## 2017-07-01 DIAGNOSIS — D509 Iron deficiency anemia, unspecified: Secondary | ICD-10-CM | POA: Diagnosis not present

## 2017-07-01 DIAGNOSIS — N186 End stage renal disease: Secondary | ICD-10-CM | POA: Diagnosis not present

## 2017-07-02 ENCOUNTER — Ambulatory Visit: Payer: Medicare Other | Admitting: Infectious Disease

## 2017-07-03 DIAGNOSIS — N186 End stage renal disease: Secondary | ICD-10-CM | POA: Diagnosis not present

## 2017-07-03 DIAGNOSIS — N2581 Secondary hyperparathyroidism of renal origin: Secondary | ICD-10-CM | POA: Diagnosis not present

## 2017-07-03 DIAGNOSIS — D509 Iron deficiency anemia, unspecified: Secondary | ICD-10-CM | POA: Diagnosis not present

## 2017-07-05 DIAGNOSIS — N2581 Secondary hyperparathyroidism of renal origin: Secondary | ICD-10-CM | POA: Diagnosis not present

## 2017-07-05 DIAGNOSIS — N186 End stage renal disease: Secondary | ICD-10-CM | POA: Diagnosis not present

## 2017-07-05 DIAGNOSIS — D509 Iron deficiency anemia, unspecified: Secondary | ICD-10-CM | POA: Diagnosis not present

## 2017-07-08 DIAGNOSIS — N2581 Secondary hyperparathyroidism of renal origin: Secondary | ICD-10-CM | POA: Diagnosis not present

## 2017-07-08 DIAGNOSIS — D509 Iron deficiency anemia, unspecified: Secondary | ICD-10-CM | POA: Diagnosis not present

## 2017-07-08 DIAGNOSIS — N186 End stage renal disease: Secondary | ICD-10-CM | POA: Diagnosis not present

## 2017-07-09 MED FILL — PREZCOBIX 800 MG-150 MG TAB: 800-150 | 30 days supply | Qty: 30 | Fill #4

## 2017-07-09 MED FILL — BIKTARVY 50-200-25 MG TABS: 50-200-25 | 30 days supply | Qty: 30 | Fill #4

## 2017-07-10 DIAGNOSIS — N2581 Secondary hyperparathyroidism of renal origin: Secondary | ICD-10-CM | POA: Diagnosis not present

## 2017-07-10 DIAGNOSIS — N186 End stage renal disease: Secondary | ICD-10-CM | POA: Diagnosis not present

## 2017-07-10 DIAGNOSIS — D509 Iron deficiency anemia, unspecified: Secondary | ICD-10-CM | POA: Diagnosis not present

## 2017-07-12 DIAGNOSIS — D509 Iron deficiency anemia, unspecified: Secondary | ICD-10-CM | POA: Diagnosis not present

## 2017-07-12 DIAGNOSIS — N2581 Secondary hyperparathyroidism of renal origin: Secondary | ICD-10-CM | POA: Diagnosis not present

## 2017-07-12 DIAGNOSIS — Z992 Dependence on renal dialysis: Secondary | ICD-10-CM | POA: Diagnosis not present

## 2017-07-12 DIAGNOSIS — I120 Hypertensive chronic kidney disease with stage 5 chronic kidney disease or end stage renal disease: Secondary | ICD-10-CM | POA: Diagnosis not present

## 2017-07-12 DIAGNOSIS — N186 End stage renal disease: Secondary | ICD-10-CM | POA: Diagnosis not present

## 2017-07-15 DIAGNOSIS — N2581 Secondary hyperparathyroidism of renal origin: Secondary | ICD-10-CM | POA: Diagnosis not present

## 2017-07-15 DIAGNOSIS — Z23 Encounter for immunization: Secondary | ICD-10-CM | POA: Diagnosis not present

## 2017-07-15 DIAGNOSIS — D509 Iron deficiency anemia, unspecified: Secondary | ICD-10-CM | POA: Diagnosis not present

## 2017-07-15 DIAGNOSIS — N186 End stage renal disease: Secondary | ICD-10-CM | POA: Diagnosis not present

## 2017-07-17 DIAGNOSIS — Z23 Encounter for immunization: Secondary | ICD-10-CM | POA: Diagnosis not present

## 2017-07-17 DIAGNOSIS — D509 Iron deficiency anemia, unspecified: Secondary | ICD-10-CM | POA: Diagnosis not present

## 2017-07-17 DIAGNOSIS — N2581 Secondary hyperparathyroidism of renal origin: Secondary | ICD-10-CM | POA: Diagnosis not present

## 2017-07-17 DIAGNOSIS — N186 End stage renal disease: Secondary | ICD-10-CM | POA: Diagnosis not present

## 2017-07-19 DIAGNOSIS — N2581 Secondary hyperparathyroidism of renal origin: Secondary | ICD-10-CM | POA: Diagnosis not present

## 2017-07-19 DIAGNOSIS — D509 Iron deficiency anemia, unspecified: Secondary | ICD-10-CM | POA: Diagnosis not present

## 2017-07-19 DIAGNOSIS — N186 End stage renal disease: Secondary | ICD-10-CM | POA: Diagnosis not present

## 2017-07-19 DIAGNOSIS — Z23 Encounter for immunization: Secondary | ICD-10-CM | POA: Diagnosis not present

## 2017-07-22 DIAGNOSIS — Z23 Encounter for immunization: Secondary | ICD-10-CM | POA: Diagnosis not present

## 2017-07-22 DIAGNOSIS — N186 End stage renal disease: Secondary | ICD-10-CM | POA: Diagnosis not present

## 2017-07-22 DIAGNOSIS — N2581 Secondary hyperparathyroidism of renal origin: Secondary | ICD-10-CM | POA: Diagnosis not present

## 2017-07-22 DIAGNOSIS — D509 Iron deficiency anemia, unspecified: Secondary | ICD-10-CM | POA: Diagnosis not present

## 2017-07-24 DIAGNOSIS — N186 End stage renal disease: Secondary | ICD-10-CM | POA: Diagnosis not present

## 2017-07-24 DIAGNOSIS — Z23 Encounter for immunization: Secondary | ICD-10-CM | POA: Diagnosis not present

## 2017-07-24 DIAGNOSIS — N2581 Secondary hyperparathyroidism of renal origin: Secondary | ICD-10-CM | POA: Diagnosis not present

## 2017-07-24 DIAGNOSIS — D509 Iron deficiency anemia, unspecified: Secondary | ICD-10-CM | POA: Diagnosis not present

## 2017-07-26 DIAGNOSIS — N2581 Secondary hyperparathyroidism of renal origin: Secondary | ICD-10-CM | POA: Diagnosis not present

## 2017-07-26 DIAGNOSIS — Z23 Encounter for immunization: Secondary | ICD-10-CM | POA: Diagnosis not present

## 2017-07-26 DIAGNOSIS — N186 End stage renal disease: Secondary | ICD-10-CM | POA: Diagnosis not present

## 2017-07-26 DIAGNOSIS — D509 Iron deficiency anemia, unspecified: Secondary | ICD-10-CM | POA: Diagnosis not present

## 2017-07-29 DIAGNOSIS — Z23 Encounter for immunization: Secondary | ICD-10-CM | POA: Diagnosis not present

## 2017-07-29 DIAGNOSIS — N2581 Secondary hyperparathyroidism of renal origin: Secondary | ICD-10-CM | POA: Diagnosis not present

## 2017-07-29 DIAGNOSIS — N186 End stage renal disease: Secondary | ICD-10-CM | POA: Diagnosis not present

## 2017-07-29 DIAGNOSIS — D509 Iron deficiency anemia, unspecified: Secondary | ICD-10-CM | POA: Diagnosis not present

## 2017-07-31 DIAGNOSIS — N2581 Secondary hyperparathyroidism of renal origin: Secondary | ICD-10-CM | POA: Diagnosis not present

## 2017-07-31 DIAGNOSIS — Z23 Encounter for immunization: Secondary | ICD-10-CM | POA: Diagnosis not present

## 2017-07-31 DIAGNOSIS — N186 End stage renal disease: Secondary | ICD-10-CM | POA: Diagnosis not present

## 2017-07-31 DIAGNOSIS — D509 Iron deficiency anemia, unspecified: Secondary | ICD-10-CM | POA: Diagnosis not present

## 2017-08-02 DIAGNOSIS — N186 End stage renal disease: Secondary | ICD-10-CM | POA: Diagnosis not present

## 2017-08-02 DIAGNOSIS — N2581 Secondary hyperparathyroidism of renal origin: Secondary | ICD-10-CM | POA: Diagnosis not present

## 2017-08-02 DIAGNOSIS — D509 Iron deficiency anemia, unspecified: Secondary | ICD-10-CM | POA: Diagnosis not present

## 2017-08-02 DIAGNOSIS — Z23 Encounter for immunization: Secondary | ICD-10-CM | POA: Diagnosis not present

## 2017-08-05 DIAGNOSIS — N186 End stage renal disease: Secondary | ICD-10-CM | POA: Diagnosis not present

## 2017-08-05 DIAGNOSIS — N2581 Secondary hyperparathyroidism of renal origin: Secondary | ICD-10-CM | POA: Diagnosis not present

## 2017-08-05 DIAGNOSIS — D509 Iron deficiency anemia, unspecified: Secondary | ICD-10-CM | POA: Diagnosis not present

## 2017-08-05 DIAGNOSIS — Z23 Encounter for immunization: Secondary | ICD-10-CM | POA: Diagnosis not present

## 2017-08-05 MED FILL — PREZCOBIX 800 MG-150 MG TAB: 800-150 | 30 days supply | Qty: 30 | Fill #5

## 2017-08-05 MED FILL — BIKTARVY 50-200-25 MG TABS: 50-200-25 | 30 days supply | Qty: 30 | Fill #5

## 2017-08-07 DIAGNOSIS — N2581 Secondary hyperparathyroidism of renal origin: Secondary | ICD-10-CM | POA: Diagnosis not present

## 2017-08-07 DIAGNOSIS — N186 End stage renal disease: Secondary | ICD-10-CM | POA: Diagnosis not present

## 2017-08-07 DIAGNOSIS — D509 Iron deficiency anemia, unspecified: Secondary | ICD-10-CM | POA: Diagnosis not present

## 2017-08-07 DIAGNOSIS — Z23 Encounter for immunization: Secondary | ICD-10-CM | POA: Diagnosis not present

## 2017-08-09 DIAGNOSIS — N2581 Secondary hyperparathyroidism of renal origin: Secondary | ICD-10-CM | POA: Diagnosis not present

## 2017-08-09 DIAGNOSIS — N186 End stage renal disease: Secondary | ICD-10-CM | POA: Diagnosis not present

## 2017-08-09 DIAGNOSIS — Z23 Encounter for immunization: Secondary | ICD-10-CM | POA: Diagnosis not present

## 2017-08-09 DIAGNOSIS — D509 Iron deficiency anemia, unspecified: Secondary | ICD-10-CM | POA: Diagnosis not present

## 2017-08-11 DIAGNOSIS — Z992 Dependence on renal dialysis: Secondary | ICD-10-CM | POA: Diagnosis not present

## 2017-08-11 DIAGNOSIS — N186 End stage renal disease: Secondary | ICD-10-CM | POA: Diagnosis not present

## 2017-08-11 DIAGNOSIS — I120 Hypertensive chronic kidney disease with stage 5 chronic kidney disease or end stage renal disease: Secondary | ICD-10-CM | POA: Diagnosis not present

## 2017-08-12 DIAGNOSIS — D509 Iron deficiency anemia, unspecified: Secondary | ICD-10-CM | POA: Diagnosis not present

## 2017-08-12 DIAGNOSIS — N2581 Secondary hyperparathyroidism of renal origin: Secondary | ICD-10-CM | POA: Diagnosis not present

## 2017-08-12 DIAGNOSIS — N186 End stage renal disease: Secondary | ICD-10-CM | POA: Diagnosis not present

## 2017-08-14 DIAGNOSIS — D509 Iron deficiency anemia, unspecified: Secondary | ICD-10-CM | POA: Diagnosis not present

## 2017-08-14 DIAGNOSIS — N2581 Secondary hyperparathyroidism of renal origin: Secondary | ICD-10-CM | POA: Diagnosis not present

## 2017-08-14 DIAGNOSIS — N186 End stage renal disease: Secondary | ICD-10-CM | POA: Diagnosis not present

## 2017-08-16 DIAGNOSIS — D509 Iron deficiency anemia, unspecified: Secondary | ICD-10-CM | POA: Diagnosis not present

## 2017-08-16 DIAGNOSIS — N2581 Secondary hyperparathyroidism of renal origin: Secondary | ICD-10-CM | POA: Diagnosis not present

## 2017-08-16 DIAGNOSIS — N186 End stage renal disease: Secondary | ICD-10-CM | POA: Diagnosis not present

## 2017-08-19 DIAGNOSIS — N2581 Secondary hyperparathyroidism of renal origin: Secondary | ICD-10-CM | POA: Diagnosis not present

## 2017-08-19 DIAGNOSIS — D509 Iron deficiency anemia, unspecified: Secondary | ICD-10-CM | POA: Diagnosis not present

## 2017-08-19 DIAGNOSIS — N186 End stage renal disease: Secondary | ICD-10-CM | POA: Diagnosis not present

## 2017-08-21 DIAGNOSIS — N2581 Secondary hyperparathyroidism of renal origin: Secondary | ICD-10-CM | POA: Diagnosis not present

## 2017-08-21 DIAGNOSIS — D509 Iron deficiency anemia, unspecified: Secondary | ICD-10-CM | POA: Diagnosis not present

## 2017-08-21 DIAGNOSIS — N186 End stage renal disease: Secondary | ICD-10-CM | POA: Diagnosis not present

## 2017-08-23 DIAGNOSIS — D509 Iron deficiency anemia, unspecified: Secondary | ICD-10-CM | POA: Diagnosis not present

## 2017-08-23 DIAGNOSIS — N2581 Secondary hyperparathyroidism of renal origin: Secondary | ICD-10-CM | POA: Diagnosis not present

## 2017-08-23 DIAGNOSIS — N186 End stage renal disease: Secondary | ICD-10-CM | POA: Diagnosis not present

## 2017-08-26 DIAGNOSIS — N186 End stage renal disease: Secondary | ICD-10-CM | POA: Diagnosis not present

## 2017-08-26 DIAGNOSIS — D509 Iron deficiency anemia, unspecified: Secondary | ICD-10-CM | POA: Diagnosis not present

## 2017-08-26 DIAGNOSIS — N2581 Secondary hyperparathyroidism of renal origin: Secondary | ICD-10-CM | POA: Diagnosis not present

## 2017-08-28 DIAGNOSIS — N186 End stage renal disease: Secondary | ICD-10-CM | POA: Diagnosis not present

## 2017-08-28 DIAGNOSIS — D509 Iron deficiency anemia, unspecified: Secondary | ICD-10-CM | POA: Diagnosis not present

## 2017-08-28 DIAGNOSIS — N2581 Secondary hyperparathyroidism of renal origin: Secondary | ICD-10-CM | POA: Diagnosis not present

## 2017-08-30 ENCOUNTER — Other Ambulatory Visit: Payer: Self-pay | Admitting: Infectious Disease

## 2017-08-30 DIAGNOSIS — N2581 Secondary hyperparathyroidism of renal origin: Secondary | ICD-10-CM | POA: Diagnosis not present

## 2017-08-30 DIAGNOSIS — B20 Human immunodeficiency virus [HIV] disease: Secondary | ICD-10-CM

## 2017-08-30 DIAGNOSIS — D509 Iron deficiency anemia, unspecified: Secondary | ICD-10-CM | POA: Diagnosis not present

## 2017-08-30 DIAGNOSIS — N186 End stage renal disease: Secondary | ICD-10-CM | POA: Diagnosis not present

## 2017-09-02 DIAGNOSIS — D509 Iron deficiency anemia, unspecified: Secondary | ICD-10-CM | POA: Diagnosis not present

## 2017-09-02 DIAGNOSIS — N186 End stage renal disease: Secondary | ICD-10-CM | POA: Diagnosis not present

## 2017-09-02 DIAGNOSIS — N2581 Secondary hyperparathyroidism of renal origin: Secondary | ICD-10-CM | POA: Diagnosis not present

## 2017-09-04 DIAGNOSIS — N186 End stage renal disease: Secondary | ICD-10-CM | POA: Diagnosis not present

## 2017-09-04 DIAGNOSIS — N2581 Secondary hyperparathyroidism of renal origin: Secondary | ICD-10-CM | POA: Diagnosis not present

## 2017-09-04 DIAGNOSIS — D509 Iron deficiency anemia, unspecified: Secondary | ICD-10-CM | POA: Diagnosis not present

## 2017-09-06 DIAGNOSIS — D509 Iron deficiency anemia, unspecified: Secondary | ICD-10-CM | POA: Diagnosis not present

## 2017-09-06 DIAGNOSIS — N186 End stage renal disease: Secondary | ICD-10-CM | POA: Diagnosis not present

## 2017-09-06 DIAGNOSIS — N2581 Secondary hyperparathyroidism of renal origin: Secondary | ICD-10-CM | POA: Diagnosis not present

## 2017-09-06 MED FILL — PREZCOBIX 800 MG-150 MG TAB: 800-150 | 30 days supply | Qty: 30 | Fill #0

## 2017-09-06 MED FILL — BIKTARVY 50-200-25 MG TABS: 50-200-25 | 30 days supply | Qty: 30 | Fill #0

## 2017-09-09 DIAGNOSIS — N2581 Secondary hyperparathyroidism of renal origin: Secondary | ICD-10-CM | POA: Diagnosis not present

## 2017-09-09 DIAGNOSIS — D509 Iron deficiency anemia, unspecified: Secondary | ICD-10-CM | POA: Diagnosis not present

## 2017-09-09 DIAGNOSIS — N186 End stage renal disease: Secondary | ICD-10-CM | POA: Diagnosis not present

## 2017-09-11 DIAGNOSIS — D509 Iron deficiency anemia, unspecified: Secondary | ICD-10-CM | POA: Diagnosis not present

## 2017-09-11 DIAGNOSIS — I120 Hypertensive chronic kidney disease with stage 5 chronic kidney disease or end stage renal disease: Secondary | ICD-10-CM | POA: Diagnosis not present

## 2017-09-11 DIAGNOSIS — Z992 Dependence on renal dialysis: Secondary | ICD-10-CM | POA: Diagnosis not present

## 2017-09-11 DIAGNOSIS — N2581 Secondary hyperparathyroidism of renal origin: Secondary | ICD-10-CM | POA: Diagnosis not present

## 2017-09-11 DIAGNOSIS — N186 End stage renal disease: Secondary | ICD-10-CM | POA: Diagnosis not present

## 2017-09-13 DIAGNOSIS — N2581 Secondary hyperparathyroidism of renal origin: Secondary | ICD-10-CM | POA: Diagnosis not present

## 2017-09-13 DIAGNOSIS — N186 End stage renal disease: Secondary | ICD-10-CM | POA: Diagnosis not present

## 2017-09-13 DIAGNOSIS — D509 Iron deficiency anemia, unspecified: Secondary | ICD-10-CM | POA: Diagnosis not present

## 2017-09-16 DIAGNOSIS — N186 End stage renal disease: Secondary | ICD-10-CM | POA: Diagnosis not present

## 2017-09-16 DIAGNOSIS — D509 Iron deficiency anemia, unspecified: Secondary | ICD-10-CM | POA: Diagnosis not present

## 2017-09-16 DIAGNOSIS — N2581 Secondary hyperparathyroidism of renal origin: Secondary | ICD-10-CM | POA: Diagnosis not present

## 2017-09-18 DIAGNOSIS — N186 End stage renal disease: Secondary | ICD-10-CM | POA: Diagnosis not present

## 2017-09-18 DIAGNOSIS — N2581 Secondary hyperparathyroidism of renal origin: Secondary | ICD-10-CM | POA: Diagnosis not present

## 2017-09-18 DIAGNOSIS — D509 Iron deficiency anemia, unspecified: Secondary | ICD-10-CM | POA: Diagnosis not present

## 2017-09-20 DIAGNOSIS — N2581 Secondary hyperparathyroidism of renal origin: Secondary | ICD-10-CM | POA: Diagnosis not present

## 2017-09-20 DIAGNOSIS — D509 Iron deficiency anemia, unspecified: Secondary | ICD-10-CM | POA: Diagnosis not present

## 2017-09-20 DIAGNOSIS — N186 End stage renal disease: Secondary | ICD-10-CM | POA: Diagnosis not present

## 2017-09-23 DIAGNOSIS — D509 Iron deficiency anemia, unspecified: Secondary | ICD-10-CM | POA: Diagnosis not present

## 2017-09-23 DIAGNOSIS — N186 End stage renal disease: Secondary | ICD-10-CM | POA: Diagnosis not present

## 2017-09-23 DIAGNOSIS — N2581 Secondary hyperparathyroidism of renal origin: Secondary | ICD-10-CM | POA: Diagnosis not present

## 2017-09-25 DIAGNOSIS — D509 Iron deficiency anemia, unspecified: Secondary | ICD-10-CM | POA: Diagnosis not present

## 2017-09-25 DIAGNOSIS — N2581 Secondary hyperparathyroidism of renal origin: Secondary | ICD-10-CM | POA: Diagnosis not present

## 2017-09-25 DIAGNOSIS — N186 End stage renal disease: Secondary | ICD-10-CM | POA: Diagnosis not present

## 2017-09-27 DIAGNOSIS — D509 Iron deficiency anemia, unspecified: Secondary | ICD-10-CM | POA: Diagnosis not present

## 2017-09-27 DIAGNOSIS — N186 End stage renal disease: Secondary | ICD-10-CM | POA: Diagnosis not present

## 2017-09-27 DIAGNOSIS — N2581 Secondary hyperparathyroidism of renal origin: Secondary | ICD-10-CM | POA: Diagnosis not present

## 2017-09-29 DIAGNOSIS — N186 End stage renal disease: Secondary | ICD-10-CM | POA: Diagnosis not present

## 2017-09-29 DIAGNOSIS — D509 Iron deficiency anemia, unspecified: Secondary | ICD-10-CM | POA: Diagnosis not present

## 2017-09-29 DIAGNOSIS — N2581 Secondary hyperparathyroidism of renal origin: Secondary | ICD-10-CM | POA: Diagnosis not present

## 2017-10-01 DIAGNOSIS — D509 Iron deficiency anemia, unspecified: Secondary | ICD-10-CM | POA: Diagnosis not present

## 2017-10-01 DIAGNOSIS — N186 End stage renal disease: Secondary | ICD-10-CM | POA: Diagnosis not present

## 2017-10-01 DIAGNOSIS — N2581 Secondary hyperparathyroidism of renal origin: Secondary | ICD-10-CM | POA: Diagnosis not present

## 2017-10-02 ENCOUNTER — Telehealth: Payer: Self-pay | Admitting: Licensed Clinical Social Worker

## 2017-10-02 NOTE — Telephone Encounter (Signed)
[facility] left message requesting a return phone call to schedule an appointment.  Sande Rives, [facility]

## 2017-10-04 DIAGNOSIS — N186 End stage renal disease: Secondary | ICD-10-CM | POA: Diagnosis not present

## 2017-10-04 DIAGNOSIS — N2581 Secondary hyperparathyroidism of renal origin: Secondary | ICD-10-CM | POA: Diagnosis not present

## 2017-10-04 DIAGNOSIS — D509 Iron deficiency anemia, unspecified: Secondary | ICD-10-CM | POA: Diagnosis not present

## 2017-10-07 DIAGNOSIS — N2581 Secondary hyperparathyroidism of renal origin: Secondary | ICD-10-CM | POA: Diagnosis not present

## 2017-10-07 DIAGNOSIS — D509 Iron deficiency anemia, unspecified: Secondary | ICD-10-CM | POA: Diagnosis not present

## 2017-10-07 DIAGNOSIS — N186 End stage renal disease: Secondary | ICD-10-CM | POA: Diagnosis not present

## 2017-10-09 DIAGNOSIS — N186 End stage renal disease: Secondary | ICD-10-CM | POA: Diagnosis not present

## 2017-10-09 DIAGNOSIS — N2581 Secondary hyperparathyroidism of renal origin: Secondary | ICD-10-CM | POA: Diagnosis not present

## 2017-10-09 DIAGNOSIS — D509 Iron deficiency anemia, unspecified: Secondary | ICD-10-CM | POA: Diagnosis not present

## 2017-10-09 MED FILL — PREZCOBIX 800 MG-150 MG TAB: 800-150 | 30 days supply | Qty: 30 | Fill #1

## 2017-10-09 MED FILL — BIKTARVY 50-200-25 MG TABS: 50-200-25 | 30 days supply | Qty: 30 | Fill #1

## 2017-10-11 DIAGNOSIS — N186 End stage renal disease: Secondary | ICD-10-CM | POA: Diagnosis not present

## 2017-10-11 DIAGNOSIS — N2581 Secondary hyperparathyroidism of renal origin: Secondary | ICD-10-CM | POA: Diagnosis not present

## 2017-10-11 DIAGNOSIS — Z992 Dependence on renal dialysis: Secondary | ICD-10-CM | POA: Diagnosis not present

## 2017-10-11 DIAGNOSIS — I120 Hypertensive chronic kidney disease with stage 5 chronic kidney disease or end stage renal disease: Secondary | ICD-10-CM | POA: Diagnosis not present

## 2017-10-11 DIAGNOSIS — D509 Iron deficiency anemia, unspecified: Secondary | ICD-10-CM | POA: Diagnosis not present

## 2017-10-14 DIAGNOSIS — N2581 Secondary hyperparathyroidism of renal origin: Secondary | ICD-10-CM | POA: Diagnosis not present

## 2017-10-14 DIAGNOSIS — D509 Iron deficiency anemia, unspecified: Secondary | ICD-10-CM | POA: Diagnosis not present

## 2017-10-14 DIAGNOSIS — N186 End stage renal disease: Secondary | ICD-10-CM | POA: Diagnosis not present

## 2017-10-16 DIAGNOSIS — N2581 Secondary hyperparathyroidism of renal origin: Secondary | ICD-10-CM | POA: Diagnosis not present

## 2017-10-16 DIAGNOSIS — D509 Iron deficiency anemia, unspecified: Secondary | ICD-10-CM | POA: Diagnosis not present

## 2017-10-16 DIAGNOSIS — N186 End stage renal disease: Secondary | ICD-10-CM | POA: Diagnosis not present

## 2017-10-17 DIAGNOSIS — D509 Iron deficiency anemia, unspecified: Secondary | ICD-10-CM | POA: Diagnosis not present

## 2017-10-17 DIAGNOSIS — N186 End stage renal disease: Secondary | ICD-10-CM | POA: Diagnosis not present

## 2017-10-17 DIAGNOSIS — N2581 Secondary hyperparathyroidism of renal origin: Secondary | ICD-10-CM | POA: Diagnosis not present

## 2017-10-21 DIAGNOSIS — N2581 Secondary hyperparathyroidism of renal origin: Secondary | ICD-10-CM | POA: Diagnosis not present

## 2017-10-21 DIAGNOSIS — D509 Iron deficiency anemia, unspecified: Secondary | ICD-10-CM | POA: Diagnosis not present

## 2017-10-21 DIAGNOSIS — N186 End stage renal disease: Secondary | ICD-10-CM | POA: Diagnosis not present

## 2017-10-23 DIAGNOSIS — N186 End stage renal disease: Secondary | ICD-10-CM | POA: Diagnosis not present

## 2017-10-23 DIAGNOSIS — N2581 Secondary hyperparathyroidism of renal origin: Secondary | ICD-10-CM | POA: Diagnosis not present

## 2017-10-23 DIAGNOSIS — D509 Iron deficiency anemia, unspecified: Secondary | ICD-10-CM | POA: Diagnosis not present

## 2017-10-25 DIAGNOSIS — N2581 Secondary hyperparathyroidism of renal origin: Secondary | ICD-10-CM | POA: Diagnosis not present

## 2017-10-25 DIAGNOSIS — N186 End stage renal disease: Secondary | ICD-10-CM | POA: Diagnosis not present

## 2017-10-25 DIAGNOSIS — D509 Iron deficiency anemia, unspecified: Secondary | ICD-10-CM | POA: Diagnosis not present

## 2017-10-28 DIAGNOSIS — N2581 Secondary hyperparathyroidism of renal origin: Secondary | ICD-10-CM | POA: Diagnosis not present

## 2017-10-28 DIAGNOSIS — N186 End stage renal disease: Secondary | ICD-10-CM | POA: Diagnosis not present

## 2017-10-28 DIAGNOSIS — D509 Iron deficiency anemia, unspecified: Secondary | ICD-10-CM | POA: Diagnosis not present

## 2017-10-30 DIAGNOSIS — D509 Iron deficiency anemia, unspecified: Secondary | ICD-10-CM | POA: Diagnosis not present

## 2017-10-30 DIAGNOSIS — N186 End stage renal disease: Secondary | ICD-10-CM | POA: Diagnosis not present

## 2017-10-30 DIAGNOSIS — N2581 Secondary hyperparathyroidism of renal origin: Secondary | ICD-10-CM | POA: Diagnosis not present

## 2017-11-01 DIAGNOSIS — D509 Iron deficiency anemia, unspecified: Secondary | ICD-10-CM | POA: Diagnosis not present

## 2017-11-01 DIAGNOSIS — N186 End stage renal disease: Secondary | ICD-10-CM | POA: Diagnosis not present

## 2017-11-01 DIAGNOSIS — N2581 Secondary hyperparathyroidism of renal origin: Secondary | ICD-10-CM | POA: Diagnosis not present

## 2017-11-03 DIAGNOSIS — N186 End stage renal disease: Secondary | ICD-10-CM | POA: Diagnosis not present

## 2017-11-03 DIAGNOSIS — D509 Iron deficiency anemia, unspecified: Secondary | ICD-10-CM | POA: Diagnosis not present

## 2017-11-03 DIAGNOSIS — N2581 Secondary hyperparathyroidism of renal origin: Secondary | ICD-10-CM | POA: Diagnosis not present

## 2017-11-06 DIAGNOSIS — N2581 Secondary hyperparathyroidism of renal origin: Secondary | ICD-10-CM | POA: Diagnosis not present

## 2017-11-06 DIAGNOSIS — N186 End stage renal disease: Secondary | ICD-10-CM | POA: Diagnosis not present

## 2017-11-06 DIAGNOSIS — D509 Iron deficiency anemia, unspecified: Secondary | ICD-10-CM | POA: Diagnosis not present

## 2017-11-08 DIAGNOSIS — N2581 Secondary hyperparathyroidism of renal origin: Secondary | ICD-10-CM | POA: Diagnosis not present

## 2017-11-08 DIAGNOSIS — N186 End stage renal disease: Secondary | ICD-10-CM | POA: Diagnosis not present

## 2017-11-08 DIAGNOSIS — D509 Iron deficiency anemia, unspecified: Secondary | ICD-10-CM | POA: Diagnosis not present

## 2017-11-08 MED FILL — BIKTARVY 50-200-25 MG TABS: 50-200-25 | 30 days supply | Qty: 30 | Fill #2

## 2017-11-08 MED FILL — PREZCOBIX 800 MG-150 MG TAB: 800-150 | 30 days supply | Qty: 30 | Fill #2

## 2017-11-10 DIAGNOSIS — D509 Iron deficiency anemia, unspecified: Secondary | ICD-10-CM | POA: Diagnosis not present

## 2017-11-10 DIAGNOSIS — N186 End stage renal disease: Secondary | ICD-10-CM | POA: Diagnosis not present

## 2017-11-10 DIAGNOSIS — N2581 Secondary hyperparathyroidism of renal origin: Secondary | ICD-10-CM | POA: Diagnosis not present

## 2017-11-11 DIAGNOSIS — I120 Hypertensive chronic kidney disease with stage 5 chronic kidney disease or end stage renal disease: Secondary | ICD-10-CM | POA: Diagnosis not present

## 2017-11-11 DIAGNOSIS — N186 End stage renal disease: Secondary | ICD-10-CM | POA: Diagnosis not present

## 2017-11-11 DIAGNOSIS — Z992 Dependence on renal dialysis: Secondary | ICD-10-CM | POA: Diagnosis not present

## 2017-11-13 DIAGNOSIS — N2581 Secondary hyperparathyroidism of renal origin: Secondary | ICD-10-CM | POA: Diagnosis not present

## 2017-11-13 DIAGNOSIS — N186 End stage renal disease: Secondary | ICD-10-CM | POA: Diagnosis not present

## 2017-11-13 DIAGNOSIS — D509 Iron deficiency anemia, unspecified: Secondary | ICD-10-CM | POA: Diagnosis not present

## 2017-11-15 DIAGNOSIS — N2581 Secondary hyperparathyroidism of renal origin: Secondary | ICD-10-CM | POA: Diagnosis not present

## 2017-11-15 DIAGNOSIS — D509 Iron deficiency anemia, unspecified: Secondary | ICD-10-CM | POA: Diagnosis not present

## 2017-11-15 DIAGNOSIS — N186 End stage renal disease: Secondary | ICD-10-CM | POA: Diagnosis not present

## 2017-11-18 DIAGNOSIS — D509 Iron deficiency anemia, unspecified: Secondary | ICD-10-CM | POA: Diagnosis not present

## 2017-11-18 DIAGNOSIS — N186 End stage renal disease: Secondary | ICD-10-CM | POA: Diagnosis not present

## 2017-11-18 DIAGNOSIS — N2581 Secondary hyperparathyroidism of renal origin: Secondary | ICD-10-CM | POA: Diagnosis not present

## 2017-11-20 DIAGNOSIS — D509 Iron deficiency anemia, unspecified: Secondary | ICD-10-CM | POA: Diagnosis not present

## 2017-11-20 DIAGNOSIS — N186 End stage renal disease: Secondary | ICD-10-CM | POA: Diagnosis not present

## 2017-11-20 DIAGNOSIS — N2581 Secondary hyperparathyroidism of renal origin: Secondary | ICD-10-CM | POA: Diagnosis not present

## 2017-11-22 DIAGNOSIS — D509 Iron deficiency anemia, unspecified: Secondary | ICD-10-CM | POA: Diagnosis not present

## 2017-11-22 DIAGNOSIS — N186 End stage renal disease: Secondary | ICD-10-CM | POA: Diagnosis not present

## 2017-11-22 DIAGNOSIS — N2581 Secondary hyperparathyroidism of renal origin: Secondary | ICD-10-CM | POA: Diagnosis not present

## 2017-11-25 DIAGNOSIS — N186 End stage renal disease: Secondary | ICD-10-CM | POA: Diagnosis not present

## 2017-11-25 DIAGNOSIS — N2581 Secondary hyperparathyroidism of renal origin: Secondary | ICD-10-CM | POA: Diagnosis not present

## 2017-11-25 DIAGNOSIS — D509 Iron deficiency anemia, unspecified: Secondary | ICD-10-CM | POA: Diagnosis not present

## 2017-11-27 DIAGNOSIS — N2581 Secondary hyperparathyroidism of renal origin: Secondary | ICD-10-CM | POA: Diagnosis not present

## 2017-11-27 DIAGNOSIS — D509 Iron deficiency anemia, unspecified: Secondary | ICD-10-CM | POA: Diagnosis not present

## 2017-11-27 DIAGNOSIS — N186 End stage renal disease: Secondary | ICD-10-CM | POA: Diagnosis not present

## 2017-11-29 DIAGNOSIS — N186 End stage renal disease: Secondary | ICD-10-CM | POA: Diagnosis not present

## 2017-11-29 DIAGNOSIS — D509 Iron deficiency anemia, unspecified: Secondary | ICD-10-CM | POA: Diagnosis not present

## 2017-11-29 DIAGNOSIS — N2581 Secondary hyperparathyroidism of renal origin: Secondary | ICD-10-CM | POA: Diagnosis not present

## 2017-12-02 DIAGNOSIS — N2581 Secondary hyperparathyroidism of renal origin: Secondary | ICD-10-CM | POA: Diagnosis not present

## 2017-12-02 DIAGNOSIS — N186 End stage renal disease: Secondary | ICD-10-CM | POA: Diagnosis not present

## 2017-12-02 DIAGNOSIS — D509 Iron deficiency anemia, unspecified: Secondary | ICD-10-CM | POA: Diagnosis not present

## 2017-12-04 DIAGNOSIS — D509 Iron deficiency anemia, unspecified: Secondary | ICD-10-CM | POA: Diagnosis not present

## 2017-12-04 DIAGNOSIS — N2581 Secondary hyperparathyroidism of renal origin: Secondary | ICD-10-CM | POA: Diagnosis not present

## 2017-12-04 DIAGNOSIS — N186 End stage renal disease: Secondary | ICD-10-CM | POA: Diagnosis not present

## 2017-12-06 DIAGNOSIS — N186 End stage renal disease: Secondary | ICD-10-CM | POA: Diagnosis not present

## 2017-12-06 DIAGNOSIS — N2581 Secondary hyperparathyroidism of renal origin: Secondary | ICD-10-CM | POA: Diagnosis not present

## 2017-12-06 DIAGNOSIS — D509 Iron deficiency anemia, unspecified: Secondary | ICD-10-CM | POA: Diagnosis not present

## 2017-12-09 DIAGNOSIS — D509 Iron deficiency anemia, unspecified: Secondary | ICD-10-CM | POA: Diagnosis not present

## 2017-12-09 DIAGNOSIS — N2581 Secondary hyperparathyroidism of renal origin: Secondary | ICD-10-CM | POA: Diagnosis not present

## 2017-12-09 DIAGNOSIS — N186 End stage renal disease: Secondary | ICD-10-CM | POA: Diagnosis not present

## 2017-12-11 DIAGNOSIS — D509 Iron deficiency anemia, unspecified: Secondary | ICD-10-CM | POA: Diagnosis not present

## 2017-12-11 DIAGNOSIS — N186 End stage renal disease: Secondary | ICD-10-CM | POA: Diagnosis not present

## 2017-12-11 DIAGNOSIS — N2581 Secondary hyperparathyroidism of renal origin: Secondary | ICD-10-CM | POA: Diagnosis not present

## 2017-12-12 DIAGNOSIS — N186 End stage renal disease: Secondary | ICD-10-CM | POA: Diagnosis not present

## 2017-12-12 DIAGNOSIS — Z992 Dependence on renal dialysis: Secondary | ICD-10-CM | POA: Diagnosis not present

## 2017-12-12 DIAGNOSIS — I120 Hypertensive chronic kidney disease with stage 5 chronic kidney disease or end stage renal disease: Secondary | ICD-10-CM | POA: Diagnosis not present

## 2017-12-13 DIAGNOSIS — I120 Hypertensive chronic kidney disease with stage 5 chronic kidney disease or end stage renal disease: Secondary | ICD-10-CM | POA: Diagnosis not present

## 2017-12-13 DIAGNOSIS — N2581 Secondary hyperparathyroidism of renal origin: Secondary | ICD-10-CM | POA: Diagnosis not present

## 2017-12-13 DIAGNOSIS — N186 End stage renal disease: Secondary | ICD-10-CM | POA: Diagnosis not present

## 2017-12-13 DIAGNOSIS — D509 Iron deficiency anemia, unspecified: Secondary | ICD-10-CM | POA: Diagnosis not present

## 2017-12-13 DIAGNOSIS — Z992 Dependence on renal dialysis: Secondary | ICD-10-CM | POA: Diagnosis not present

## 2017-12-16 DIAGNOSIS — N2581 Secondary hyperparathyroidism of renal origin: Secondary | ICD-10-CM | POA: Diagnosis not present

## 2017-12-16 DIAGNOSIS — N186 End stage renal disease: Secondary | ICD-10-CM | POA: Diagnosis not present

## 2017-12-16 DIAGNOSIS — D509 Iron deficiency anemia, unspecified: Secondary | ICD-10-CM | POA: Diagnosis not present

## 2017-12-18 DIAGNOSIS — N186 End stage renal disease: Secondary | ICD-10-CM | POA: Diagnosis not present

## 2017-12-18 DIAGNOSIS — D509 Iron deficiency anemia, unspecified: Secondary | ICD-10-CM | POA: Diagnosis not present

## 2017-12-18 DIAGNOSIS — N2581 Secondary hyperparathyroidism of renal origin: Secondary | ICD-10-CM | POA: Diagnosis not present

## 2017-12-20 DIAGNOSIS — N2581 Secondary hyperparathyroidism of renal origin: Secondary | ICD-10-CM | POA: Diagnosis not present

## 2017-12-20 DIAGNOSIS — D509 Iron deficiency anemia, unspecified: Secondary | ICD-10-CM | POA: Diagnosis not present

## 2017-12-20 DIAGNOSIS — N186 End stage renal disease: Secondary | ICD-10-CM | POA: Diagnosis not present

## 2017-12-23 DIAGNOSIS — D509 Iron deficiency anemia, unspecified: Secondary | ICD-10-CM | POA: Diagnosis not present

## 2017-12-23 DIAGNOSIS — N2581 Secondary hyperparathyroidism of renal origin: Secondary | ICD-10-CM | POA: Diagnosis not present

## 2017-12-23 DIAGNOSIS — N186 End stage renal disease: Secondary | ICD-10-CM | POA: Diagnosis not present

## 2017-12-25 DIAGNOSIS — N186 End stage renal disease: Secondary | ICD-10-CM | POA: Diagnosis not present

## 2017-12-25 DIAGNOSIS — N2581 Secondary hyperparathyroidism of renal origin: Secondary | ICD-10-CM | POA: Diagnosis not present

## 2017-12-25 DIAGNOSIS — D509 Iron deficiency anemia, unspecified: Secondary | ICD-10-CM | POA: Diagnosis not present

## 2017-12-25 MED FILL — BIKTARVY 50-200-25 MG TABS: 50-200-25 | 30 days supply | Qty: 30 | Fill #3

## 2017-12-25 MED FILL — PREZCOBIX 800 MG-150 MG TAB: 800-150 | 30 days supply | Qty: 30 | Fill #3

## 2017-12-26 ENCOUNTER — Telehealth: Payer: Self-pay | Admitting: Pharmacist

## 2017-12-26 NOTE — Telephone Encounter (Signed)
Tiffany Golden has not been seen since May of 2018.  I have been calling her for months with no return of my calls.  She gets her HIV medications from [facility] and Holley Dexter, the pharmacist at [facility], recently told me that [name] was ~17 days late filling her medications this past month when they could not get in touch with her.  She said she had left over medications but that does not seem possible.  I called [name] to see if I could get her to come in and see me but had to leave a VM. Hopefully she will call me back.

## 2017-12-26 NOTE — Telephone Encounter (Signed)
We may need to deploy Mitch

## 2017-12-27 DIAGNOSIS — N186 End stage renal disease: Secondary | ICD-10-CM | POA: Diagnosis not present

## 2017-12-27 DIAGNOSIS — D509 Iron deficiency anemia, unspecified: Secondary | ICD-10-CM | POA: Diagnosis not present

## 2017-12-27 DIAGNOSIS — N2581 Secondary hyperparathyroidism of renal origin: Secondary | ICD-10-CM | POA: Diagnosis not present

## 2017-12-30 DIAGNOSIS — N186 End stage renal disease: Secondary | ICD-10-CM | POA: Diagnosis not present

## 2017-12-30 DIAGNOSIS — N2581 Secondary hyperparathyroidism of renal origin: Secondary | ICD-10-CM | POA: Diagnosis not present

## 2017-12-30 DIAGNOSIS — D509 Iron deficiency anemia, unspecified: Secondary | ICD-10-CM | POA: Diagnosis not present

## 2018-01-01 DIAGNOSIS — N2581 Secondary hyperparathyroidism of renal origin: Secondary | ICD-10-CM | POA: Diagnosis not present

## 2018-01-01 DIAGNOSIS — D509 Iron deficiency anemia, unspecified: Secondary | ICD-10-CM | POA: Diagnosis not present

## 2018-01-01 DIAGNOSIS — N186 End stage renal disease: Secondary | ICD-10-CM | POA: Diagnosis not present

## 2018-01-03 ENCOUNTER — Other Ambulatory Visit: Payer: Self-pay | Admitting: Pharmacist

## 2018-01-03 DIAGNOSIS — B20 Human immunodeficiency virus [HIV] disease: Secondary | ICD-10-CM

## 2018-01-03 DIAGNOSIS — N2581 Secondary hyperparathyroidism of renal origin: Secondary | ICD-10-CM | POA: Diagnosis not present

## 2018-01-03 DIAGNOSIS — D509 Iron deficiency anemia, unspecified: Secondary | ICD-10-CM | POA: Diagnosis not present

## 2018-01-03 DIAGNOSIS — N186 End stage renal disease: Secondary | ICD-10-CM | POA: Diagnosis not present

## 2018-01-06 DIAGNOSIS — N2581 Secondary hyperparathyroidism of renal origin: Secondary | ICD-10-CM | POA: Diagnosis not present

## 2018-01-06 DIAGNOSIS — D509 Iron deficiency anemia, unspecified: Secondary | ICD-10-CM | POA: Diagnosis not present

## 2018-01-06 DIAGNOSIS — N186 End stage renal disease: Secondary | ICD-10-CM | POA: Diagnosis not present

## 2018-01-08 DIAGNOSIS — D509 Iron deficiency anemia, unspecified: Secondary | ICD-10-CM | POA: Diagnosis not present

## 2018-01-08 DIAGNOSIS — N186 End stage renal disease: Secondary | ICD-10-CM | POA: Diagnosis not present

## 2018-01-08 DIAGNOSIS — N2581 Secondary hyperparathyroidism of renal origin: Secondary | ICD-10-CM | POA: Diagnosis not present

## 2018-01-10 DIAGNOSIS — Z992 Dependence on renal dialysis: Secondary | ICD-10-CM | POA: Diagnosis not present

## 2018-01-10 DIAGNOSIS — I120 Hypertensive chronic kidney disease with stage 5 chronic kidney disease or end stage renal disease: Secondary | ICD-10-CM | POA: Diagnosis not present

## 2018-01-10 DIAGNOSIS — N2581 Secondary hyperparathyroidism of renal origin: Secondary | ICD-10-CM | POA: Diagnosis not present

## 2018-01-10 DIAGNOSIS — D509 Iron deficiency anemia, unspecified: Secondary | ICD-10-CM | POA: Diagnosis not present

## 2018-01-10 DIAGNOSIS — N186 End stage renal disease: Secondary | ICD-10-CM | POA: Diagnosis not present

## 2018-01-13 DIAGNOSIS — D509 Iron deficiency anemia, unspecified: Secondary | ICD-10-CM | POA: Diagnosis not present

## 2018-01-13 DIAGNOSIS — N2581 Secondary hyperparathyroidism of renal origin: Secondary | ICD-10-CM | POA: Diagnosis not present

## 2018-01-13 DIAGNOSIS — N186 End stage renal disease: Secondary | ICD-10-CM | POA: Diagnosis not present

## 2018-01-15 DIAGNOSIS — N2581 Secondary hyperparathyroidism of renal origin: Secondary | ICD-10-CM | POA: Diagnosis not present

## 2018-01-15 DIAGNOSIS — D509 Iron deficiency anemia, unspecified: Secondary | ICD-10-CM | POA: Diagnosis not present

## 2018-01-15 DIAGNOSIS — N186 End stage renal disease: Secondary | ICD-10-CM | POA: Diagnosis not present

## 2018-01-17 DIAGNOSIS — N2581 Secondary hyperparathyroidism of renal origin: Secondary | ICD-10-CM | POA: Diagnosis not present

## 2018-01-17 DIAGNOSIS — N186 End stage renal disease: Secondary | ICD-10-CM | POA: Diagnosis not present

## 2018-01-17 DIAGNOSIS — D509 Iron deficiency anemia, unspecified: Secondary | ICD-10-CM | POA: Diagnosis not present

## 2018-01-20 DIAGNOSIS — N2581 Secondary hyperparathyroidism of renal origin: Secondary | ICD-10-CM | POA: Diagnosis not present

## 2018-01-20 DIAGNOSIS — N186 End stage renal disease: Secondary | ICD-10-CM | POA: Diagnosis not present

## 2018-01-20 DIAGNOSIS — D509 Iron deficiency anemia, unspecified: Secondary | ICD-10-CM | POA: Diagnosis not present

## 2018-01-21 ENCOUNTER — Telehealth: Payer: Self-pay | Admitting: Pharmacist

## 2018-01-21 MED FILL — BIKTARVY 50-200-25 MG TABS: 50-200-25 | 30 days supply | Qty: 30 | Fill #4

## 2018-01-21 MED FILL — PREZCOBIX 800 MG-150 MG TAB: 800-150 | 30 days supply | Qty: 30 | Fill #4

## 2018-01-21 NOTE — Telephone Encounter (Signed)
Finally able to get in touch with [facility] after trying to contact her for ~9 months.  I saw her back on 04/11/17 after switching her from tivicay/abacavir/lamivudine to Biktarvy/Prezcobix.  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 When I saw her in April after the switch,  she was having significant nausea but it was getting better with the addition of Zofran.  When I talked to her today, I asked how she was doing and she stated "hanging in there".  I asked how the medication was going and she sighed heavily and stated not well at all.  She also has ESRD on dialysis and states the medication is making her extremely sick.  She states she misses at least 1-2 doses per week and usually does not take it on dialysis days at all.  I explained how dangerous this was and asked her to come in ASAP to discuss switching/managment.  Dr. Tommy Medal did not have any openings until April but patient is ok with seeing myself and NP.  Scheduled her Marya Amsler for next Thursday 3/21 at 230 as well as with me at 2pm. Will most likely need to repeat resistance labs and see what we can offer besides a PI (probably causing her nausea).

## 2018-01-21 NOTE — Telephone Encounter (Signed)
I would also consider SYMTUZA (which might be sufficient in itself with only the 184v unless she has more resistance)  If she does not have new resistance then Biktarvy and AZT not a bad option.

## 2018-01-22 DIAGNOSIS — N2581 Secondary hyperparathyroidism of renal origin: Secondary | ICD-10-CM | POA: Diagnosis not present

## 2018-01-22 DIAGNOSIS — N186 End stage renal disease: Secondary | ICD-10-CM | POA: Diagnosis not present

## 2018-01-22 DIAGNOSIS — D509 Iron deficiency anemia, unspecified: Secondary | ICD-10-CM | POA: Diagnosis not present

## 2018-01-22 NOTE — Telephone Encounter (Signed)
I was thinking the Prezcobix was probably the one giving her the most side effects.. But definitely an option

## 2018-01-22 NOTE — Telephone Encounter (Signed)
I am ok with trying BIKTARVy and AZT. Pills are smaller as well

## 2018-01-24 DIAGNOSIS — N2581 Secondary hyperparathyroidism of renal origin: Secondary | ICD-10-CM | POA: Diagnosis not present

## 2018-01-24 DIAGNOSIS — D509 Iron deficiency anemia, unspecified: Secondary | ICD-10-CM | POA: Diagnosis not present

## 2018-01-24 DIAGNOSIS — N186 End stage renal disease: Secondary | ICD-10-CM | POA: Diagnosis not present

## 2018-01-27 DIAGNOSIS — N186 End stage renal disease: Secondary | ICD-10-CM | POA: Diagnosis not present

## 2018-01-27 DIAGNOSIS — N2581 Secondary hyperparathyroidism of renal origin: Secondary | ICD-10-CM | POA: Diagnosis not present

## 2018-01-27 DIAGNOSIS — D509 Iron deficiency anemia, unspecified: Secondary | ICD-10-CM | POA: Diagnosis not present

## 2018-01-29 DIAGNOSIS — N186 End stage renal disease: Secondary | ICD-10-CM | POA: Diagnosis not present

## 2018-01-29 DIAGNOSIS — D509 Iron deficiency anemia, unspecified: Secondary | ICD-10-CM | POA: Diagnosis not present

## 2018-01-29 DIAGNOSIS — N2581 Secondary hyperparathyroidism of renal origin: Secondary | ICD-10-CM | POA: Diagnosis not present

## 2018-01-30 ENCOUNTER — Ambulatory Visit (INDEPENDENT_AMBULATORY_CARE_PROVIDER_SITE_OTHER): Payer: Medicare Other | Admitting: Family

## 2018-01-30 ENCOUNTER — Ambulatory Visit (INDEPENDENT_AMBULATORY_CARE_PROVIDER_SITE_OTHER): Payer: Medicare Other | Admitting: Pharmacist

## 2018-01-30 ENCOUNTER — Encounter: Payer: Self-pay | Admitting: Family Medicine

## 2018-01-30 ENCOUNTER — Ambulatory Visit (INDEPENDENT_AMBULATORY_CARE_PROVIDER_SITE_OTHER): Payer: Medicare Other | Admitting: Family Medicine

## 2018-01-30 ENCOUNTER — Encounter: Payer: Self-pay | Admitting: Family

## 2018-01-30 VITALS — BP 165/103 | HR 77 | Temp 97.6°F | Ht 61.0 in | Wt 193.8 lb

## 2018-01-30 VITALS — BP 132/86 | Ht 61.0 in | Wt 193.0 lb

## 2018-01-30 DIAGNOSIS — B20 Human immunodeficiency virus [HIV] disease: Secondary | ICD-10-CM | POA: Diagnosis not present

## 2018-01-30 DIAGNOSIS — Z23 Encounter for immunization: Secondary | ICD-10-CM

## 2018-01-30 DIAGNOSIS — Z9119 Patient's noncompliance with other medical treatment and regimen: Secondary | ICD-10-CM | POA: Diagnosis not present

## 2018-01-30 DIAGNOSIS — N186 End stage renal disease: Secondary | ICD-10-CM | POA: Diagnosis not present

## 2018-01-30 DIAGNOSIS — R5383 Other fatigue: Secondary | ICD-10-CM

## 2018-01-30 DIAGNOSIS — Z7189 Other specified counseling: Secondary | ICD-10-CM

## 2018-01-30 DIAGNOSIS — Z0000 Encounter for general adult medical examination without abnormal findings: Secondary | ICD-10-CM

## 2018-01-30 DIAGNOSIS — Z91199 Patient's noncompliance with other medical treatment and regimen due to unspecified reason: Secondary | ICD-10-CM

## 2018-01-30 MED ORDER — BICTEGRAVIR-EMTRICITAB-TENOFOV 50-200-25 MG PO TABS: 1.0000 | ORAL_TABLET | Freq: Every day | ORAL | 5 refills | Status: DC

## 2018-01-30 MED ORDER — SERTRALINE HCL 100 MG PO TABS
100.0000 mg | ORAL_TABLET | Freq: Every day | ORAL | 5 refills | Status: DC
Start: 2018-01-30 — End: 2020-02-25

## 2018-01-30 MED ORDER — ZIDOVUDINE 300 MG PO TABS: 300.0000 mg | ORAL_TABLET | Freq: Every day | ORAL | 1 refills | Status: DC

## 2018-01-30 NOTE — Patient Instructions (Signed)
Nice to meet you.  We will check your blood work today.  We will update your Pneumovax today (PPSV23).  You have had Prevnar so you do not need a booster.  We will check your Hepatitis status to determine need for vaccination.  Follow up in 1 month or sooner if needed.

## 2018-01-30 NOTE — Progress Notes (Signed)
[place] for Infectious Disease Pharmacy Visit  HPI: Tiffany Golden is a 42 y.o. female who presents to the [place] clinic today for HIV follow-up.  Patient Active Problem List   Diagnosis Date Noted  . Patient non adherence   . Grieving   . Moderate single current episode of major depressive disorder ([place])   . Altered mental status 02/21/2017  . Menorrhagia with regular cycle 04/26/2015  . Lumbar spondylosis with myelopathy 02/02/2015  . End stage renal disease ([place]) 07/30/2013  . Enteritis due to Clostridium difficile 04/07/2013  . TIA (transient ischemic attack) 04/05/2013  . Fever 04/05/2013  . ESRD on hemodialysis ([place]) 04/05/2013  . AIDS (acquired immune deficiency syndrome) ([place]) 04/05/2013  . HIV (human immunodeficiency virus infection) ([place]) 09/06/2011  . ESRD (end stage renal disease) ([place]) 09/06/2011  . Hypertension 09/06/2011    Patient's Medications  New Prescriptions   ZIDOVUDINE (RETROVIR) 300 MG TABLET    Take 1 tablet (300 mg total) by mouth daily.  Previous Medications   B COMPLEX-C-FOLIC ACID (RENA-VITE RX) 1 MG TABS    Take 1 tablet by mouth daily.   CALCIUM CARBONATE (TUMS - DOSED IN MG ELEMENTAL CALCIUM) 500 MG CHEWABLE TABLET    Chew 1-2 tablets by mouth daily as needed for indigestion or heartburn.   DAPSONE 100 MG TABLET    Take 1 tablet (100 mg total) by mouth daily.   METOPROLOL TARTRATE (LOPRESSOR) 25 MG TABLET    Take 12.5 mg by mouth 2 (two) times daily as needed (for blood pressure). Except takes none on dialysis days (Tuesday, Thursday, and Sunday).   ONDANSETRON (ZOFRAN-ODT) 4 MG DISINTEGRATING TABLET    DISSOLVE 1 TABLET BY MOUTH EVERY 8 HOURS AS NEEDED FOR NAUSEA AND VOMITING   SERTRALINE (ZOLOFT) 100 MG TABLET    Take 1 tablet (100 mg total) by mouth daily.  Modified Medications   Modified Medication Previous Medication   BICTEGRAVIR-EMTRICITABINE-TENOFOVIR AF (BIKTARVY) 50-200-25 MG TABS TABLET BIKTARVY 50-200-25 MG TABS tablet      Take 1  tablet by mouth daily.    TAKE 1 TABLET BY MOUTH ONCE DAILY  Discontinued Medications   No medications on file    Allergies: Allergies  Allergen Reactions  . Fortaz [Ceftazidime Sodium In D5w] Rash    Head-toe  . Sulfa Antibiotics Hives  . Vancomycin Rash    Head-toe    Past Medical History: Past Medical History:  Diagnosis Date  . Anemia   . Chronic kidney disease   . Complication of anesthesia   . Dialysis patient [facility])    mon, wed 1  . ESRD (end stage renal disease) ([place])   . HIV infection ([place])   . Hypertension   . PONV (postoperative nausea and vomiting)   . Stroke ([place])   . TIA (transient ischemic attack)    Hx:of    Social History: Social History   Socioeconomic History  . Marital status: Single    Spouse name: Not on file  . Number of children: Not on file  . Years of education: Not on file  . Highest education level: Not on file  Occupational History  . Not on file  Social Needs  . Financial resource strain: Not on file  . Food insecurity:    Worry: Not on file    Inability: Not on file  . Transportation needs:    Medical: Not on file    Non-medical: Not on file  Tobacco Use  . Smoking status: Former Smoker  Packs/day: 0.50    Years: 3.00    Pack years: 1.50    Types: Cigarettes    Last attempt to quit: 09/05/2009    Years since quitting: 8.4  . Smokeless tobacco: Never Used  Substance and Sexual Activity  . Alcohol use: No  . Drug use: No  . Sexual activity: Not Currently    Partners: Male    Comment: declined condoms  Lifestyle  . Physical activity:    Days per week: Not on file    Minutes per session: Not on file  . Stress: Not on file  Relationships  . Social connections:    Talks on phone: Not on file    Gets together: Not on file    Attends religious service: Not on file    Active member of club or organization: Not on file    Attends meetings of clubs or organizations: Not on file    Relationship status: Not on file   Other Topics Concern  . Not on file  Social History Narrative  . Not on file    Labs: HIV 1 RNA Quant (copies/mL)  Date Value  04/11/2017 1,820 (H)  06/30/2015 32 (H)  12/30/2014 78 (H)   CD4 T Cell Abs (/uL)  Date Value  04/11/2017 520  02/21/2017 180 (L)  06/30/2015 400   Hep B S Ab (no units)  Date Value  03/26/2013 REACTIVE (A)   Hepatitis B Surface Ag (no units)  Date Value  03/26/2013 NEGATIVE   HCV Ab (no units)  Date Value  08/03/2014 NEGATIVE    Lipids:    Component Value Date/Time   CHOL 224 (H) 06/30/2015 1027   TRIG 103 06/30/2015 1027   HDL 54 06/30/2015 1027   CHOLHDL 4.1 06/30/2015 1027   VLDL 21 06/30/2015 1027   LDLCALC 149 (H) 06/30/2015 1027    Current HIV Regimen: Biktarvy + Prezcobix  Assessment: Tiffany Golden is here today to follow-up for her HIV infection.  She was last seen here by me in May 2018 when we changed her from abacavir/lamivudine/dolutegravir to [facility] + Prezcobix.  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  I had been trying to get in touch with her for ~9 months when I finally did last week.  She told me she wasn't taking her medications as prescribed due to side effects, so I scheduled her to come in and see myself and Marya Amsler.  She admits to not taking her Prezcobix at all because it makes her sick.  She is taking her Biktarvy but not every day.  She is also on dialysis on MWF.  After discussion with Marya Amsler, we decided to stop the Prezcobix, continue Biktarvy, and start AZT.  She is agreeable to this. I counseled her on how to take the new regimen and Marya Amsler did too as well as went into depth about her resistance.  He will recheck resistance labs today.  She will come back and see Marya Amsler in ~4 weeks.  Plan: - Stop Prezcobix - Continue Biktarvy PO once daily - Start Zidovudine 300 mg PO once daily - F/u with Marya Amsler in 4 weeks  Cassie L. Kuppelweiser, PharmD, [place], [place] for Infectious  Disease 01/30/2018, 2:48 PM

## 2018-01-30 NOTE — Progress Notes (Signed)
Subjective:    Patient ID: Tiffany Golden, female    DOB: 1976-10-29, 42 y.o.   MRN: [id number]  Chief Complaint  Patient presents with  . Follow-up     HPI:  Tiffany Golden is a 42 y.o. female who presents today for follow up of her HIV disease.  Tiffany Golden was last seen on  03/05/17 and was changed to [facility] and Prescobix. Her last blood work was in May of 2018 with a viral load of 1820 and a CD4 count of 27. She was also prescribed dapsone for PCP prophylaxis secondary to her sulfa allergy. Her last prescriptions were written in October of 2018. 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 She had been having trouble with the Bicktarvy and Prezcobix causing her to feel sick and resulting in her missing 1-2 doses per week. She was not taking medications on her dialysis days.   Currently taking her Biktarvy inconsistently and has not taken the Prezcobix secondary to GI symptoms nausea, vomiting and refractory to Zofran. She is not currently taking the Dapsone. Denies fevers, chills, changes in vision, nausea, vomiting, diarrhea, headaches, or neck/pain stiffness. Not currently sexually active.   She continues to attend dialysis on MWF with Dr. Jimmy Footman as her attending nephrologist. She was seen by her primary care provider Dr. Wolfgang Phoenix for her wellness exam.    Lab Results  Component Value Date   CD4TCELL 27 (L) 04/11/2017   CD4TABS 520 04/11/2017   Lab Results  Component Value Date   HIV1RNAQUANT 1,820 (H) 04/11/2017    Allergies  Allergen Reactions  . Fortaz [Ceftazidime Sodium In D5w] Rash    Head-toe  . Sulfa Antibiotics Hives  . Vancomycin Rash    Head-toe     Outpatient Medications Prior to Visit  Medication Sig Dispense Refill  . B Complex-C-Folic Acid (RENA-VITE RX) 1 MG TABS Take 1 tablet by mouth daily.    . calcium carbonate (TUMS - DOSED IN MG ELEMENTAL CALCIUM) 500 MG chewable tablet Chew 1-2 tablets by mouth daily as needed for indigestion or heartburn.    . metoprolol tartrate  (LOPRESSOR) 25 MG tablet Take 12.5 mg by mouth 2 (two) times daily as needed (for blood pressure). Except takes none on dialysis days (Tuesday, Thursday, and Sunday).    . ondansetron (ZOFRAN-ODT) 4 MG disintegrating tablet DISSOLVE 1 TABLET BY MOUTH EVERY 8 HOURS AS NEEDED FOR NAUSEA AND VOMITING 20 tablet 0  . sertraline (ZOLOFT) 100 MG tablet Take 1 tablet (100 mg total) by mouth daily. 30 tablet 5  . BIKTARVY 50-200-25 MG TABS tablet TAKE 1 TABLET BY MOUTH ONCE DAILY 30 tablet 5  . dapsone 100 MG tablet Take 1 tablet (100 mg total) by mouth daily. (Patient not taking: Reported on 01/30/2018) 30 tablet 5  . PREZCOBIX 800-150 MG tablet TAKE 1 TABLET BY MOUTH DAILY WITH BREAKFAST. SWALLOW WHOLE. DO NOT CRUSH, BREAK OR CHEW TABLETS. TAKE WITH FOOD. (Patient not taking: Reported on 01/30/2018) 30 tablet 5   No facility-administered medications prior to visit.    Immunization History  Administered Date(s) Administered  . Influenza,inj,Quad PF,6+ Mos 07/16/2013, 08/18/2014, 07/14/2015  . Influenza-Unspecified 08/12/2012, 08/07/2017  . Pneumococcal Conjugate-13 09/29/2015  . Pneumococcal Polysaccharide-23 11/12/2012  . Tdap 09/30/2014    Past Medical History:  Diagnosis Date  . Anemia   . Chronic kidney disease   . Complication of anesthesia   . Dialysis patient [facility])    mon, wed 71  . ESRD (end stage renal disease) ([place])   .  HIV infection ([place])   . Hypertension   . PONV (postoperative nausea and vomiting)   . Stroke ([place])   . TIA (transient ischemic attack)    Hx:of     Past Surgical History:  Procedure Laterality Date  . ARTERIOVENOUS GRAFT PLACEMENT  09/11/11   left arm  . CESAREAN SECTION    . DILITATION & CURRETTAGE/HYSTROSCOPY WITH NOVASURE ABLATION N/A 05/03/2015   Procedure: DILATATION/HYSTEROSCOPY WITH NOVASURE ABLATION; uterine cavity length 5.0 cm, uterine cavity width 3.8 cm, power 105 watts; time 1 minute 12 seconds;  Surgeon: Jonnie Kind, MD;  [facility];   Service: Gynecology;  Laterality: N/A;  . INSERTION OF DIALYSIS CATHETER Right 09/01/2013   Procedure: INSERTION OF DIALYSIS CATHETER Right Internal Jugular;  Surgeon: Elam Dutch, MD;  Location: [place];  Service: Vascular;  Laterality: Right;  . PATCH ANGIOPLASTY Left 09/01/2013   Procedure: PATCH ANGIOPLASTY;  Surgeon: Elam Dutch, MD;  Location: [place];  Service: Vascular;  Laterality: Left;  . REVISON OF ARTERIOVENOUS FISTULA Left 69/62/9528   Procedure: Plication left arm fistula;  Surgeon: Elam Dutch, MD;  Location: [facility];  Service: Vascular;  Laterality: Left;       Review of Systems  Constitutional: Negative for appetite change, chills, diaphoresis, fatigue, fever and unexpected weight change.  Eyes:       Negative for acute change in vision  Respiratory: Negative for chest tightness, shortness of breath and wheezing.   Cardiovascular: Negative for chest pain.  Gastrointestinal: Negative for diarrhea, nausea and vomiting.  Genitourinary: Negative for dysuria, pelvic pain and vaginal discharge.  Musculoskeletal: Negative for neck pain and neck stiffness.  Skin: Negative for rash.  Neurological: Negative for seizures, syncope, weakness and headaches.  Hematological: Negative for adenopathy. Does not bruise/bleed easily.  Psychiatric/Behavioral: Negative for hallucinations.      Objective:    BP (!) 165/103 (BP Location: Right Arm, Patient Position: Sitting, Cuff Size: Large)   Pulse 77   Temp 97.6 F (36.4 C) (Oral)   Ht 5\' 1"  (1.549 m)   Wt 193 lb 12 oz (87.9 kg)   LMP 05/01/2015   BMI 36.61 kg/m  Nursing note and vital signs reviewed.  Physical Exam  Constitutional: She is oriented to person, place, and time. She appears well-developed and well-nourished. No distress.  HENT:  Mouth/Throat: Oropharynx is clear and moist. No oropharyngeal exudate.  Neck: Neck supple.  Cardiovascular: Normal rate, regular rhythm, normal heart sounds and intact distal  pulses. Exam reveals no gallop and no friction rub.  No murmur heard. Pulmonary/Chest: Effort normal and breath sounds normal. No respiratory distress. She has no wheezes. She has no rales. She exhibits no tenderness.  Abdominal: Soft. Bowel sounds are normal.  Lymphadenopathy:    She has no cervical adenopathy.  Neurological: She is alert and oriented to person, place, and time.  Skin: Skin is warm and dry. No rash noted.  Psychiatric: She has a normal mood and affect. Her behavior is normal. Judgment and thought content normal.       Assessment & Plan:   Problem List Items Addressed This Visit      Genitourinary   ESRD (end stage renal disease) ([place])    Stable and followed by Dr. Jimmy Footman on MWF schedule.         Other   AIDS (acquired immune deficiency syndrome) ([place]) - Primary    Taking her Biktarvy sporadically with concern for developing resistance. Discontinue Prezcobix. New salvage regimen will  be Biktarvy and Retrovir. We will check her CD4 and RNA with reflex to genotype including for integrase resistance. Pneumovax updated today. Check Hep A antibody and Hep B surface antigen/antibody to determine need for vaccination. Check annual RPR. Has no problems obtaining medication or affording medication. Declines condoms. Follow up in 1 month or sooner if needed.       Relevant Medications   zidovudine (RETROVIR) 300 MG tablet   bictegravir-emtricitabine-tenofovir AF (BIKTARVY) 50-200-25 MG TABS tablet   Other Relevant Orders   T-helper cell (CD4)- ([facility] clinic only)   HIV RNA, RTPCR W/R GT (RTI, PI,INT)   Hepatitis A Ab, Total   Hepatitis B surface antigen   Hepatitis B surface antibody   RPR       I have discontinued Tiffany Golden's PREZCOBIX. I have also changed her BIKTARVY to bictegravir-emtricitabine-tenofovir AF. Additionally, I am having her start on zidovudine. Lastly, I am having her maintain her RENA-VITE RX, metoprolol tartrate, calcium carbonate, dapsone,  ondansetron, and sertraline.   Meds ordered this encounter  Medications  . zidovudine (RETROVIR) 300 MG tablet    Sig: Take 1 tablet (300 mg total) by mouth daily.    Dispense:  30 tablet    Refill:  1    Order Specific Question:   Supervising Provider    Answer:   Carlyle Basques [[id number]]  . bictegravir-emtricitabine-tenofovir AF (BIKTARVY) 50-200-25 MG TABS tablet    Sig: Take 1 tablet by mouth daily.    Dispense:  30 tablet    Refill:  5    Order Specific Question:   Supervising Provider    Answer:   Carlyle Basques [[id number]]     Follow-up: Return in about 1 month (around 03/02/2018).   Terri Piedra, MSN, [facility]

## 2018-01-30 NOTE — Addendum Note (Signed)
Addended by: Alberteen Sam on: 01/30/2018 02:57 PM   Modules accepted: Orders

## 2018-01-30 NOTE — Assessment & Plan Note (Signed)
Taking her Biktarvy sporadically with concern for developing resistance. Discontinue Prezcobix. New salvage regimen will be Biktarvy and Retrovir. We will check her CD4 and RNA with reflex to genotype including for integrase resistance. Pneumovax updated today. Check Hep A antibody and Hep B surface antigen/antibody to determine need for vaccination. Check annual RPR. Has no problems obtaining medication or affording medication. Declines condoms. Follow up in 1 month or sooner if needed.

## 2018-01-30 NOTE — Assessment & Plan Note (Deleted)
Stable and followed by Dr. Jimmy Footman on MWF schedule.

## 2018-01-30 NOTE — Assessment & Plan Note (Signed)
Stable and followed by Dr. Jimmy Footman on MWF schedule.

## 2018-01-30 NOTE — Progress Notes (Signed)
Subjective:    Patient ID: Tiffany Golden, female    DOB: 03/04/1976, 42 y.o.   MRN: [id number]  HPI AWV- Annual Wellness Visit  The patient was seen for their annual wellness visit. The patient's past medical history, surgical history, and family history were reviewed. Pertinent vaccines were reviewed ( tetanus, pneumonia, shingles, flu) The patient's medication list was reviewed and updated.  The height and weight were entered.  BMI recorded in electronic record elsewhere  Cognitive screening was completed. Outcome of Mini - Cog: Passed   Falls /depression screening electronically recorded within record elsewhere  Current tobacco usage:non smoker (All patients who use tobacco were given written and verbal information on quitting)  Recent listing of emergency department/hospitalizations over the past year were reviewed.  current specialist the patient sees on a regular basis: Patient does see infectious disease for HIV also sees renal doctor for dialysis is on transplant list   Medicare annual wellness visit patient questionnaire was reviewed.  A written screening schedule for the patient for the next 5-10 years was given. Appropriate discussion of followup regarding next visit was discussed.  Has not felt suicidal, good health status, no alcohol, health conscious diet, Dialysis(mon, wed, Fri); walk on treadmill and some cardio    Review of Systems  Constitutional: Negative for activity change, appetite change and fatigue.  HENT: Negative for congestion and rhinorrhea.   Eyes: Negative for discharge.  Respiratory: Negative for cough, chest tightness and wheezing.   Cardiovascular: Negative for chest pain.  Gastrointestinal: Negative for abdominal pain, blood in stool and vomiting.  Endocrine: Negative for polyphagia.  Genitourinary: Negative for difficulty urinating and frequency.  Musculoskeletal: Negative for neck pain.  Skin: Negative for color change.   Allergic/Immunologic: Negative for environmental allergies and food allergies.  Neurological: Negative for weakness and headaches.  Psychiatric/Behavioral: Negative for agitation and behavioral problems.       Objective:   Physical Exam  Constitutional: She is oriented to person, place, and time. She appears well-developed and well-nourished.  HENT:  Head: Normocephalic and atraumatic.  Right Ear: External ear normal.  Left Ear: External ear normal.  Eyes: Right eye exhibits no discharge. Left eye exhibits no discharge.  Neck: Normal range of motion. No tracheal deviation present.  Cardiovascular: Normal rate, regular rhythm, normal heart sounds and intact distal pulses. Exam reveals no gallop.  No murmur heard. Pulmonary/Chest: Effort normal and breath sounds normal. No stridor. No respiratory distress. She has no wheezes. She has no rales.  Abdominal: Soft. Bowel sounds are normal. She exhibits no distension and no mass. There is no tenderness. There is no rebound and no guarding.  Musculoskeletal: Normal range of motion. She exhibits no edema or tenderness.  Lymphadenopathy:    She has no cervical adenopathy.  Neurological: She is alert and oriented to person, place, and time. She exhibits normal muscle tone.  Skin: Skin is warm and dry.  Psychiatric: She has a normal mood and affect. Her behavior is normal.          Assessment & Plan:  HIV-followed by infectious disease Transplant/dialysis followed by renal Screening labs ordered I did discuss immunizations with the patient she will find out from her infectious disease doctor if she needs a booster such as Prevnar 13 hepatitis A or hepatitis B Adult wellness-complete.wellness physical was conducted today. Importance of diet and exercise were discussed in detail. In addition to this a discussion regarding safety was also covered. We also reviewed over immunizations and gave  recommendations regarding current immunization  needed for age. In addition to this additional areas were also touched on including: Preventative health exams needed: Colonoscopy not indicated yet  Patient was advised yearly wellness exam

## 2018-01-31 DIAGNOSIS — N186 End stage renal disease: Secondary | ICD-10-CM | POA: Diagnosis not present

## 2018-01-31 DIAGNOSIS — N2581 Secondary hyperparathyroidism of renal origin: Secondary | ICD-10-CM | POA: Diagnosis not present

## 2018-01-31 DIAGNOSIS — D509 Iron deficiency anemia, unspecified: Secondary | ICD-10-CM | POA: Diagnosis not present

## 2018-01-31 LAB — LIPID PANEL
CHOL/HDL RATIO: 3.8 ratio (ref 0.0–4.4)
Cholesterol, Total: 184 mg/dL (ref 100–199)
HDL: 48 mg/dL (ref >39)
LDL Calculated: 111 mg/dL — ABNORMAL HIGH (ref 0–99)
Triglycerides: 126 mg/dL (ref 0–149)
VLDL CHOLESTEROL CAL: 25 mg/dL (ref 5–40)

## 2018-01-31 LAB — RPR: RPR: NONREACTIVE

## 2018-01-31 LAB — TSH: TSH: 2.62 u[IU]/mL (ref 0.450–4.500)

## 2018-01-31 LAB — HEPATITIS B SURFACE ANTIGEN: HEP B S AG: NONREACTIVE

## 2018-01-31 LAB — HEPATITIS B SURFACE ANTIBODY,QUALITATIVE: Hep B S Ab: NONREACTIVE

## 2018-01-31 LAB — HEPATITIS A ANTIBODY, TOTAL: Hepatitis A AB,Total: NONREACTIVE

## 2018-01-31 LAB — T-HELPER CELL (CD4) - (RCID CLINIC ONLY)
CD4 % Helper T Cell: 16 % — ABNORMAL LOW (ref 33–55)
CD4 T CELL ABS: 280 /uL — AB (ref 400–2700)

## 2018-01-31 LAB — GLUCOSE, RANDOM: Glucose: 83 mg/dL (ref 65–99)

## 2018-02-03 DIAGNOSIS — N186 End stage renal disease: Secondary | ICD-10-CM | POA: Diagnosis not present

## 2018-02-03 DIAGNOSIS — D509 Iron deficiency anemia, unspecified: Secondary | ICD-10-CM | POA: Diagnosis not present

## 2018-02-03 DIAGNOSIS — N2581 Secondary hyperparathyroidism of renal origin: Secondary | ICD-10-CM | POA: Diagnosis not present

## 2018-02-05 DIAGNOSIS — N2581 Secondary hyperparathyroidism of renal origin: Secondary | ICD-10-CM | POA: Diagnosis not present

## 2018-02-05 DIAGNOSIS — N186 End stage renal disease: Secondary | ICD-10-CM | POA: Diagnosis not present

## 2018-02-05 DIAGNOSIS — D509 Iron deficiency anemia, unspecified: Secondary | ICD-10-CM | POA: Diagnosis not present

## 2018-02-06 ENCOUNTER — Other Ambulatory Visit (HOSPITAL_COMMUNITY)
Admission: RE | Admit: 2018-02-06 | Discharge: 2018-02-06 | Disposition: A | Discharge status: Home / Self Care | Payer: Medicare Other | Source: Ambulatory Visit | Attending: Obstetrics and Gynecology | Admitting: Obstetrics and Gynecology

## 2018-02-06 ENCOUNTER — Encounter: Payer: Self-pay | Admitting: Obstetrics and Gynecology

## 2018-02-06 ENCOUNTER — Ambulatory Visit (INDEPENDENT_AMBULATORY_CARE_PROVIDER_SITE_OTHER): Payer: Medicare Other | Admitting: Obstetrics and Gynecology

## 2018-02-06 VITALS — BP 182/100 | HR 75 | Ht 63.0 in | Wt 192.0 lb

## 2018-02-06 DIAGNOSIS — Z01419 Encounter for gynecological examination (general) (routine) without abnormal findings: Secondary | ICD-10-CM | POA: Insufficient documentation

## 2018-02-06 DIAGNOSIS — Z124 Encounter for screening for malignant neoplasm of cervix: Secondary | ICD-10-CM | POA: Insufficient documentation

## 2018-02-06 DIAGNOSIS — Z21 Asymptomatic human immunodeficiency virus [HIV] infection status: Secondary | ICD-10-CM

## 2018-02-06 NOTE — Progress Notes (Signed)
Assessment:  Annual Gyn Exam HIV + with stable viral load S/p ESRD due to medicine effects Plan:  1. pap smear done, next pap due in 3 years 2. return annually or prn 3    Annual mammogram advised after age 42 Subjective:  Tiffany Golden is a 42 y.o. female No obstetric history on file. who presents for annual exam. No LMP recorded. Patient has had an ablation. The patient has no acute symptoms or complaints today. She is here today for only a PAP smear. She has already had her physical from her PCP.  Last pap was on 03/10/15, and normal. The following portions of the patient's history were reviewed and updated as appropriate: allergies, current medications, past family history, past medical history, past social history, past surgical history and problem list. Past Medical History:  Diagnosis Date  . Anemia   . Chronic kidney disease   . Complication of anesthesia   . Dialysis patient [facility])    mon, wed 35  . ESRD (end stage renal disease) ([place])   . HIV infection ([place])   . Hypertension   . PONV (postoperative nausea and vomiting)   . Stroke ([place])   . TIA (transient ischemic attack)    Hx:of    Past Surgical History:  Procedure Laterality Date  . ARTERIOVENOUS GRAFT PLACEMENT  09/11/11   left arm  . CESAREAN SECTION    . DILITATION & CURRETTAGE/HYSTROSCOPY WITH NOVASURE ABLATION N/A 05/03/2015   Procedure: DILATATION/HYSTEROSCOPY WITH NOVASURE ABLATION; uterine cavity length 5.0 cm, uterine cavity width 3.8 cm, power 105 watts; time 1 minute 12 seconds;  Surgeon: Jonnie Kind, MD;  Location: [facility];  Service: Gynecology;  Laterality: N/A;  . INSERTION OF DIALYSIS CATHETER Right 09/01/2013   Procedure: INSERTION OF DIALYSIS CATHETER Right Internal Jugular;  Surgeon: Elam Dutch, MD;  Location: [place];  Service: Vascular;  Laterality: Right;  . PATCH ANGIOPLASTY Left 09/01/2013   Procedure: PATCH ANGIOPLASTY;  Surgeon: Elam Dutch, MD;  Location: [place];  Service:  Vascular;  Laterality: Left;  . REVISON OF ARTERIOVENOUS FISTULA Left 32/35/5732   Procedure: Plication left arm fistula;  Surgeon: Elam Dutch, MD;  Location: [facility];  Service: Vascular;  Laterality: Left;     Current Outpatient Medications:  .  B Complex-C-Folic Acid (RENA-VITE RX) 1 MG TABS, Take 1 tablet by mouth daily., Disp: , Rfl:  .  calcium carbonate (TUMS - DOSED IN MG ELEMENTAL CALCIUM) 500 MG chewable tablet, Chew 1-2 tablets by mouth daily as needed for indigestion or heartburn., Disp: , Rfl:  .  metoprolol tartrate (LOPRESSOR) 25 MG tablet, Take 12.5 mg by mouth 2 (two) times daily as needed (for blood pressure). Except takes none on dialysis days (Tuesday, Thursday, and Sunday)., Disp: , Rfl:  .  sertraline (ZOLOFT) 100 MG tablet, Take 1 tablet (100 mg total) by mouth daily., Disp: 30 tablet, Rfl: 5 .  zidovudine (RETROVIR) 300 MG tablet, Take 1 tablet (300 mg total) by mouth daily., Disp: 30 tablet, Rfl: 1  Review of Systems Constitutional: negative Gastrointestinal: negative Genitourinary: negative  Objective:  BP (!) 182/100 (BP Location: Right Arm, Patient Position: Sitting, Cuff Size: Normal)   Pulse 75   Ht 5\' 3"  (1.6 m)   Wt 192 lb (87.1 kg)   BMI 34.01 kg/m    BMI: Body mass index is 34.01 kg/m.  General Appearance: Alert, appropriate appearance for age. No acute distress HEENT: Grossly normal Breast Exam:  Gastrointestinal: Pelvic Exam:  Cervix: mid position Uterus: retroverted, 10-12 week size Pap taken Rectovaginal: not indicated Lymphatic Exam:  Skin:  Neurologic: Normal gait and speech, no tremor  Psychiatric: Alert and oriented, appropriate affect.  Urinalysis:Not done   By signing my name below, I, Izna Ahmed, attest that this documentation has been prepared under the direction and in the presence of Jonnie Kind, MD. Electronically Signed: Jabier Gauss, Medical Scribe. 02/06/18. 2:25 PM.  I personally performed the services described  in this documentation, which was SCRIBED in my presence. The recorded information has been reviewed and considered accurate. It has been edited as necessary during review. Jonnie Kind, MD

## 2018-02-07 DIAGNOSIS — N186 End stage renal disease: Secondary | ICD-10-CM | POA: Diagnosis not present

## 2018-02-07 DIAGNOSIS — N2581 Secondary hyperparathyroidism of renal origin: Secondary | ICD-10-CM | POA: Diagnosis not present

## 2018-02-07 DIAGNOSIS — D509 Iron deficiency anemia, unspecified: Secondary | ICD-10-CM | POA: Diagnosis not present

## 2018-02-10 ENCOUNTER — Encounter (INDEPENDENT_AMBULATORY_CARE_PROVIDER_SITE_OTHER): Payer: Self-pay

## 2018-02-10 DIAGNOSIS — N186 End stage renal disease: Secondary | ICD-10-CM | POA: Diagnosis not present

## 2018-02-10 DIAGNOSIS — Z992 Dependence on renal dialysis: Secondary | ICD-10-CM | POA: Diagnosis not present

## 2018-02-10 DIAGNOSIS — N2581 Secondary hyperparathyroidism of renal origin: Secondary | ICD-10-CM | POA: Diagnosis not present

## 2018-02-10 DIAGNOSIS — D509 Iron deficiency anemia, unspecified: Secondary | ICD-10-CM | POA: Diagnosis not present

## 2018-02-10 DIAGNOSIS — I120 Hypertensive chronic kidney disease with stage 5 chronic kidney disease or end stage renal disease: Secondary | ICD-10-CM | POA: Diagnosis not present

## 2018-02-10 LAB — HIV RNA, RTPCR W/R GT (RTI, PI,INT)
HIV 1 RNA QUANT: 204000 {copies}/mL — AB
HIV-1 RNA QUANT, LOG: 5.31 {Log_copies}/mL — AB

## 2018-02-10 LAB — HIV-1 INTEGRASE GENOTYPE

## 2018-02-10 LAB — HIV-1 GENOTYPE: HIV-1 GENOTYPE: DETECTED — AB

## 2018-02-11 LAB — CYTOLOGY - PAP
Diagnosis: NEGATIVE
HPV: NOT DETECTED

## 2018-02-11 MED FILL — ZIDOVUDINE 300 MG TABLET: 300 | 30 days supply | Qty: 30 | Fill #0

## 2018-02-12 DIAGNOSIS — N186 End stage renal disease: Secondary | ICD-10-CM | POA: Diagnosis not present

## 2018-02-12 DIAGNOSIS — D509 Iron deficiency anemia, unspecified: Secondary | ICD-10-CM | POA: Diagnosis not present

## 2018-02-12 DIAGNOSIS — N2581 Secondary hyperparathyroidism of renal origin: Secondary | ICD-10-CM | POA: Diagnosis not present

## 2018-02-14 DIAGNOSIS — D509 Iron deficiency anemia, unspecified: Secondary | ICD-10-CM | POA: Diagnosis not present

## 2018-02-14 DIAGNOSIS — N2581 Secondary hyperparathyroidism of renal origin: Secondary | ICD-10-CM | POA: Diagnosis not present

## 2018-02-14 DIAGNOSIS — N186 End stage renal disease: Secondary | ICD-10-CM | POA: Diagnosis not present

## 2018-02-14 MED FILL — BIKTARVY 50-200-25 MG TABS: 50-200-25 | 30 days supply | Qty: 30 | Fill #5

## 2018-02-17 DIAGNOSIS — D509 Iron deficiency anemia, unspecified: Secondary | ICD-10-CM | POA: Diagnosis not present

## 2018-02-17 DIAGNOSIS — N2581 Secondary hyperparathyroidism of renal origin: Secondary | ICD-10-CM | POA: Diagnosis not present

## 2018-02-17 DIAGNOSIS — N186 End stage renal disease: Secondary | ICD-10-CM | POA: Diagnosis not present

## 2018-02-19 DIAGNOSIS — D509 Iron deficiency anemia, unspecified: Secondary | ICD-10-CM | POA: Diagnosis not present

## 2018-02-19 DIAGNOSIS — N2581 Secondary hyperparathyroidism of renal origin: Secondary | ICD-10-CM | POA: Diagnosis not present

## 2018-02-19 DIAGNOSIS — N186 End stage renal disease: Secondary | ICD-10-CM | POA: Diagnosis not present

## 2018-02-21 DIAGNOSIS — D509 Iron deficiency anemia, unspecified: Secondary | ICD-10-CM | POA: Diagnosis not present

## 2018-02-21 DIAGNOSIS — N186 End stage renal disease: Secondary | ICD-10-CM | POA: Diagnosis not present

## 2018-02-21 DIAGNOSIS — N2581 Secondary hyperparathyroidism of renal origin: Secondary | ICD-10-CM | POA: Diagnosis not present

## 2018-02-24 DIAGNOSIS — N2581 Secondary hyperparathyroidism of renal origin: Secondary | ICD-10-CM | POA: Diagnosis not present

## 2018-02-24 DIAGNOSIS — D509 Iron deficiency anemia, unspecified: Secondary | ICD-10-CM | POA: Diagnosis not present

## 2018-02-24 DIAGNOSIS — N186 End stage renal disease: Secondary | ICD-10-CM | POA: Diagnosis not present

## 2018-02-26 DIAGNOSIS — D509 Iron deficiency anemia, unspecified: Secondary | ICD-10-CM | POA: Diagnosis not present

## 2018-02-26 DIAGNOSIS — N2581 Secondary hyperparathyroidism of renal origin: Secondary | ICD-10-CM | POA: Diagnosis not present

## 2018-02-26 DIAGNOSIS — N186 End stage renal disease: Secondary | ICD-10-CM | POA: Diagnosis not present

## 2018-02-28 DIAGNOSIS — N2581 Secondary hyperparathyroidism of renal origin: Secondary | ICD-10-CM | POA: Diagnosis not present

## 2018-02-28 DIAGNOSIS — D509 Iron deficiency anemia, unspecified: Secondary | ICD-10-CM | POA: Diagnosis not present

## 2018-02-28 DIAGNOSIS — N186 End stage renal disease: Secondary | ICD-10-CM | POA: Diagnosis not present

## 2018-03-03 DIAGNOSIS — D509 Iron deficiency anemia, unspecified: Secondary | ICD-10-CM | POA: Diagnosis not present

## 2018-03-03 DIAGNOSIS — N2581 Secondary hyperparathyroidism of renal origin: Secondary | ICD-10-CM | POA: Diagnosis not present

## 2018-03-03 DIAGNOSIS — N186 End stage renal disease: Secondary | ICD-10-CM | POA: Diagnosis not present

## 2018-03-03 NOTE — Progress Notes (Signed)
Subjective:    Patient ID: Tiffany Golden, female    DOB: [birth year], 42 y.o.   MRN: [id number]  Chief Complaint  Patient presents with  . HIV Positive/AIDS     HPI:  Tiffany Golden is a 42 y.o. female who presents today for routine follow-up of her HIV disease.  This works last seen on 01/30/18 and found to have a viral load of 204,000 with a CD4 count of 280. Discontinued Prezcobix and was started on Biktarvy and Retrovir. Her genotype was found to be wild type with no integrese resistance. Hepatitis A was nonreactive; hepatitis B surface antigen was negative indicating nonimmune status and RPR was negative for syphilis.  She is currently taking Biktarvy and Zidouvidine as prescribed and denies adverse side effects. Denies missed dosages. She has no problems obtaining or taking her medications currently. States she is feeling good.   Denies fevers, chills, night sweats, headaches, changes in vision, neck pain/stiffness, nausea, diarrhea, vomiting, lesions or rashes.   Allergies  Allergen Reactions  . Fortaz [Ceftazidime Sodium In D5w] Rash    Head-toe  . Sulfa Antibiotics Hives  . Vancomycin Rash    Head-toe      Outpatient Medications Prior to Visit  Medication Sig Dispense Refill  . B Complex-C-Folic Acid (RENA-VITE RX) 1 MG TABS Take 1 tablet by mouth daily.    . calcium carbonate (TUMS - DOSED IN MG ELEMENTAL CALCIUM) 500 MG chewable tablet Chew 1-2 tablets by mouth daily as needed for indigestion or heartburn.    . metoprolol tartrate (LOPRESSOR) 25 MG tablet Take 12.5 mg by mouth 2 (two) times daily as needed (for blood pressure). Except takes none on dialysis days (Tuesday, Thursday, and Sunday).    . sertraline (ZOLOFT) 100 MG tablet Take 1 tablet (100 mg total) by mouth daily. 30 tablet 5  . zidovudine (RETROVIR) 300 MG tablet Take 1 tablet (300 mg total) by mouth daily. 30 tablet 1   No facility-administered medications prior to visit.      Past Medical History:    Diagnosis Date  . Anemia   . Chronic kidney disease   . Complication of anesthesia   . Dialysis patient [facility])    mon, wed 63  . ESRD (end stage renal disease) ([place])   . HIV infection ([place])   . Hypertension   . PONV (postoperative nausea and vomiting)   . Stroke ([place])   . TIA (transient ischemic attack)    Hx:of     Past Surgical History:  Procedure Laterality Date  . ARTERIOVENOUS GRAFT PLACEMENT  09/11/11   left arm  . CESAREAN SECTION    . DILITATION & CURRETTAGE/HYSTROSCOPY WITH NOVASURE ABLATION N/A 05/03/2015   Procedure: DILATATION/HYSTEROSCOPY WITH NOVASURE ABLATION; uterine cavity length 5.0 cm, uterine cavity width 3.8 cm, power 105 watts; time 1 minute 12 seconds;  Surgeon: Jonnie Kind, MD;  [facility];  Service: Gynecology;  Laterality: N/A;  . INSERTION OF DIALYSIS CATHETER Right 09/01/2013   Procedure: INSERTION OF DIALYSIS CATHETER Right Internal Jugular;  Surgeon: Elam Dutch, MD;  Location: [place];  Service: Vascular;  Laterality: Right;  . PATCH ANGIOPLASTY Left 09/01/2013   Procedure: PATCH ANGIOPLASTY;  Surgeon: Elam Dutch, MD;  Location: [place];  Service: Vascular;  Laterality: Left;  . REVISON OF ARTERIOVENOUS FISTULA Left 44/11/270   Procedure: Plication left arm fistula;  Surgeon: Elam Dutch, MD;  Location: [place];  Service: Vascular;  Laterality: Left;  Review of Systems  Constitutional: Negative for appetite change, chills, diaphoresis, fatigue, fever and unexpected weight change.  Eyes:       Negative for acute change in vision  Respiratory: Negative for chest tightness, shortness of breath and wheezing.   Cardiovascular: Negative for chest pain.  Gastrointestinal: Negative for diarrhea, nausea and vomiting.  Genitourinary: Negative for dysuria, pelvic pain and vaginal discharge.  Musculoskeletal: Negative for neck pain and neck stiffness.  Skin: Negative for rash.  Neurological: Negative for seizures, syncope,  weakness and headaches.  Hematological: Negative for adenopathy. Does not bruise/bleed easily.  Psychiatric/Behavioral: Negative for hallucinations.      Objective:    BP (!) 149/100   Pulse 83   Temp 98.3 F (36.8 C) (Oral)   Wt 196 lb (88.9 kg)   BMI 34.72 kg/m  Nursing note and vital signs reviewed.  Physical Exam  Constitutional: She is oriented to person, place, and time. She appears well-developed. No distress.  HENT:  Mouth/Throat: Oropharynx is clear and moist.  Eyes: Conjunctivae are normal.  Neck: Neck supple.  Cardiovascular: Normal rate, regular rhythm, normal heart sounds and intact distal pulses. Exam reveals no gallop and no friction rub.  No murmur heard. Pulmonary/Chest: Effort normal and breath sounds normal. No respiratory distress. She has no wheezes. She has no rales. She exhibits no tenderness.  Abdominal: Soft. Bowel sounds are normal. There is no tenderness.  Lymphadenopathy:    She has no cervical adenopathy.  Neurological: She is alert and oriented to person, place, and time.  Skin: Skin is warm and dry. No rash noted.  Psychiatric: She has a normal mood and affect. Her behavior is normal. Judgment and thought content normal.       Assessment & Plan:   Problem List Items Addressed This Visit      Other   HIV (human immunodeficiency virus infection) ([place]) - Primary    HIV with likely improved control with good medication adherence and no adverse side effects. Recheck viral load and CD4 count today. Fortunately her genotype shows wild type with no resistance. We will discontinue the zidovudine and continue Biktarvy. She was not immune to Hepatitis B so we have initiated Hepatitis B vaccination and will follow up in 1 month for the second injection. Plan to follow up for HIV recheck in 3 months or sooner if needed. Will consider meningitis vaccination at that time.       Relevant Medications   bictegravir-emtricitabine-tenofovir AF (BIKTARVY)  50-200-25 MG TABS tablet   Other Relevant Orders   T-helper cell (CD4)- ([facility] clinic only)   HIV 1 RNA quant-no reflex-bld    Other Visit Diagnoses    Need for hepatitis B vaccination       Relevant Orders   Hepatitis B vaccine adult IM (Completed)       I have discontinued Chayil T. Barb's zidovudine. I am also having her start on bictegravir-emtricitabine-tenofovir AF. Additionally, I am having her maintain her RENA-VITE RX, metoprolol tartrate, calcium carbonate, and sertraline.    Follow-up: Return in about 3 months (around 06/03/2018).   Terri Piedra, MSN, [facility]

## 2018-03-04 ENCOUNTER — Ambulatory Visit (INDEPENDENT_AMBULATORY_CARE_PROVIDER_SITE_OTHER): Payer: Medicare Other | Admitting: Family

## 2018-03-04 ENCOUNTER — Encounter: Payer: Self-pay | Admitting: Family

## 2018-03-04 VITALS — BP 149/100 | HR 83 | Temp 98.3°F | Wt 196.0 lb

## 2018-03-04 DIAGNOSIS — Z21 Asymptomatic human immunodeficiency virus [HIV] infection status: Secondary | ICD-10-CM

## 2018-03-04 DIAGNOSIS — Z23 Encounter for immunization: Secondary | ICD-10-CM

## 2018-03-04 MED ORDER — BICTEGRAVIR-EMTRICITAB-TENOFOV 50-200-25 MG PO TABS: 1.0000 | ORAL_TABLET | Freq: Every day | ORAL | 2 refills | Status: DC

## 2018-03-04 NOTE — Assessment & Plan Note (Addendum)
HIV with likely improved control with good medication adherence and no adverse side effects. No evidence of OI through history or physical exam. Recheck viral load and CD4 count today. Fortunately her genotype shows wild type with no resistance. We will discontinue the zidovudine and continue Biktarvy. She was not immune to Hepatitis B so we have initiated Hepatitis B vaccination and will follow up in 1 month for the second injection. Plan to follow up for HIV recheck in 3 months or sooner if needed. Will consider meningitis vaccination at that time.

## 2018-03-04 NOTE — Patient Instructions (Addendum)
Nice to see you.  Keep up the great work!  Please stop taking the zidovudine and continue to take the [facility].  Please schedule an office visit in 1 month for your second hepatitis B vaccination.  We will plan to see you back in 3 months for recheck with either myself or Dr. Tommy Medal.

## 2018-03-05 DIAGNOSIS — D509 Iron deficiency anemia, unspecified: Secondary | ICD-10-CM | POA: Diagnosis not present

## 2018-03-05 DIAGNOSIS — N2581 Secondary hyperparathyroidism of renal origin: Secondary | ICD-10-CM | POA: Diagnosis not present

## 2018-03-05 DIAGNOSIS — N186 End stage renal disease: Secondary | ICD-10-CM | POA: Diagnosis not present

## 2018-03-05 LAB — T-HELPER CELL (CD4) - (RCID CLINIC ONLY)
CD4 T CELL ABS: 360 /uL — AB (ref 400–2700)
CD4 T CELL HELPER: 24 % — AB (ref 33–55)

## 2018-03-06 ENCOUNTER — Encounter (INDEPENDENT_AMBULATORY_CARE_PROVIDER_SITE_OTHER): Payer: Self-pay

## 2018-03-06 LAB — HIV-1 RNA QUANT-NO REFLEX-BLD
HIV 1 RNA Quant: 85 copies/mL — ABNORMAL HIGH
HIV-1 RNA QUANT, LOG: 1.93 {Log_copies}/mL — AB

## 2018-03-07 DIAGNOSIS — N186 End stage renal disease: Secondary | ICD-10-CM | POA: Diagnosis not present

## 2018-03-07 DIAGNOSIS — N2581 Secondary hyperparathyroidism of renal origin: Secondary | ICD-10-CM | POA: Diagnosis not present

## 2018-03-07 DIAGNOSIS — D509 Iron deficiency anemia, unspecified: Secondary | ICD-10-CM | POA: Diagnosis not present

## 2018-03-10 DIAGNOSIS — N186 End stage renal disease: Secondary | ICD-10-CM | POA: Diagnosis not present

## 2018-03-10 DIAGNOSIS — D509 Iron deficiency anemia, unspecified: Secondary | ICD-10-CM | POA: Diagnosis not present

## 2018-03-10 DIAGNOSIS — N2581 Secondary hyperparathyroidism of renal origin: Secondary | ICD-10-CM | POA: Diagnosis not present

## 2018-03-12 DIAGNOSIS — D631 Anemia in chronic kidney disease: Secondary | ICD-10-CM | POA: Diagnosis not present

## 2018-03-12 DIAGNOSIS — N2581 Secondary hyperparathyroidism of renal origin: Secondary | ICD-10-CM | POA: Diagnosis not present

## 2018-03-12 DIAGNOSIS — Z992 Dependence on renal dialysis: Secondary | ICD-10-CM | POA: Diagnosis not present

## 2018-03-12 DIAGNOSIS — N186 End stage renal disease: Secondary | ICD-10-CM | POA: Diagnosis not present

## 2018-03-12 DIAGNOSIS — D509 Iron deficiency anemia, unspecified: Secondary | ICD-10-CM | POA: Diagnosis not present

## 2018-03-12 DIAGNOSIS — I120 Hypertensive chronic kidney disease with stage 5 chronic kidney disease or end stage renal disease: Secondary | ICD-10-CM | POA: Diagnosis not present

## 2018-03-13 DIAGNOSIS — N186 End stage renal disease: Secondary | ICD-10-CM | POA: Diagnosis not present

## 2018-03-13 DIAGNOSIS — D631 Anemia in chronic kidney disease: Secondary | ICD-10-CM | POA: Diagnosis not present

## 2018-03-13 DIAGNOSIS — D509 Iron deficiency anemia, unspecified: Secondary | ICD-10-CM | POA: Diagnosis not present

## 2018-03-13 DIAGNOSIS — N2581 Secondary hyperparathyroidism of renal origin: Secondary | ICD-10-CM | POA: Diagnosis not present

## 2018-03-14 ENCOUNTER — Encounter: Payer: Self-pay | Admitting: Family Medicine

## 2018-03-14 ENCOUNTER — Ambulatory Visit (INDEPENDENT_AMBULATORY_CARE_PROVIDER_SITE_OTHER): Payer: Medicare Other | Admitting: Family Medicine

## 2018-03-14 VITALS — BP 130/86 | Temp 98.3°F | Ht 63.0 in | Wt 189.6 lb

## 2018-03-14 DIAGNOSIS — J329 Chronic sinusitis, unspecified: Secondary | ICD-10-CM | POA: Diagnosis not present

## 2018-03-14 DIAGNOSIS — H6502 Acute serous otitis media, left ear: Secondary | ICD-10-CM

## 2018-03-14 MED ORDER — AMOXICILLIN-POT CLAVULANATE 875-125 MG PO TABS: 1.0000 | ORAL_TABLET | Freq: Two times a day (BID) | ORAL | 0 refills | Status: AC

## 2018-03-14 NOTE — Progress Notes (Signed)
   Subjective:    Patient ID: Tiffany Golden, female    DOB: [birth year], 42 y.o.   MRN: [id number]  Sinusitis  This is a new problem. The current episode started 1 to 4 weeks ago. The maximum temperature recorded prior to her arrival was 103 - 104 F (temp was taken at dialysis). The fever has been present for less than 1 day. Associated symptoms include congestion, coughing, ear pain, headaches, sinus pressure and a sore throat. (Aches, runny nose, pain in left ear, abdominal pain) Past treatments include acetaminophen (Benadryl Sinus). The treatment provided mild relief.   Frontal headache, pressure, worse with coughing, now sig gunky nasal disch, pt had feer of 103 on wed, went down with tylenol, using benadruyl for the dranage  Left ear uncomfortable with   Pain   Some bronchial congestion   Review of Systems  HENT: Positive for congestion, ear pain, sinus pressure and sore throat.   Respiratory: Positive for cough.   Neurological: Positive for headaches.       Objective:   Physical Exam  Alert, mild malaise. Hydration good Vitals stable. l left otitis media/left otitis media frontal/ maxillary tenderness evident positive nasal congestion. pharynx normal neck supple  lungs clear/no crackles or wheezes. heart regular in rhythm       Assessment & Plan:  Impression rhinosinusitis complicated somewhat by history of dialysis and chronic HIV which is stable per patient.  Likely post viral, discussed with patient. plan antibiotics prescribed. Questions answered. Symptomatic care discussed. warning signs discussed. WSL

## 2018-03-17 ENCOUNTER — Encounter: Payer: Self-pay | Admitting: Family Medicine

## 2018-03-17 DIAGNOSIS — N2581 Secondary hyperparathyroidism of renal origin: Secondary | ICD-10-CM | POA: Diagnosis not present

## 2018-03-17 DIAGNOSIS — N186 End stage renal disease: Secondary | ICD-10-CM | POA: Diagnosis not present

## 2018-03-17 DIAGNOSIS — D509 Iron deficiency anemia, unspecified: Secondary | ICD-10-CM | POA: Diagnosis not present

## 2018-03-17 DIAGNOSIS — D631 Anemia in chronic kidney disease: Secondary | ICD-10-CM | POA: Diagnosis not present

## 2018-03-19 DIAGNOSIS — D509 Iron deficiency anemia, unspecified: Secondary | ICD-10-CM | POA: Diagnosis not present

## 2018-03-19 DIAGNOSIS — N186 End stage renal disease: Secondary | ICD-10-CM | POA: Diagnosis not present

## 2018-03-19 DIAGNOSIS — D631 Anemia in chronic kidney disease: Secondary | ICD-10-CM | POA: Diagnosis not present

## 2018-03-19 DIAGNOSIS — N2581 Secondary hyperparathyroidism of renal origin: Secondary | ICD-10-CM | POA: Diagnosis not present

## 2018-03-21 DIAGNOSIS — N2581 Secondary hyperparathyroidism of renal origin: Secondary | ICD-10-CM | POA: Diagnosis not present

## 2018-03-21 DIAGNOSIS — D631 Anemia in chronic kidney disease: Secondary | ICD-10-CM | POA: Diagnosis not present

## 2018-03-21 DIAGNOSIS — D509 Iron deficiency anemia, unspecified: Secondary | ICD-10-CM | POA: Diagnosis not present

## 2018-03-21 DIAGNOSIS — N186 End stage renal disease: Secondary | ICD-10-CM | POA: Diagnosis not present

## 2018-03-21 MED FILL — BIKTARVY 50-200-25 MG TABS: 50-200-25 | 30 days supply | Qty: 30 | Fill #0

## 2018-03-24 DIAGNOSIS — N186 End stage renal disease: Secondary | ICD-10-CM | POA: Diagnosis not present

## 2018-03-24 DIAGNOSIS — D509 Iron deficiency anemia, unspecified: Secondary | ICD-10-CM | POA: Diagnosis not present

## 2018-03-24 DIAGNOSIS — N2581 Secondary hyperparathyroidism of renal origin: Secondary | ICD-10-CM | POA: Diagnosis not present

## 2018-03-24 DIAGNOSIS — D631 Anemia in chronic kidney disease: Secondary | ICD-10-CM | POA: Diagnosis not present

## 2018-03-26 DIAGNOSIS — N2581 Secondary hyperparathyroidism of renal origin: Secondary | ICD-10-CM | POA: Diagnosis not present

## 2018-03-26 DIAGNOSIS — D631 Anemia in chronic kidney disease: Secondary | ICD-10-CM | POA: Diagnosis not present

## 2018-03-26 DIAGNOSIS — D509 Iron deficiency anemia, unspecified: Secondary | ICD-10-CM | POA: Diagnosis not present

## 2018-03-26 DIAGNOSIS — N186 End stage renal disease: Secondary | ICD-10-CM | POA: Diagnosis not present

## 2018-03-28 DIAGNOSIS — N186 End stage renal disease: Secondary | ICD-10-CM | POA: Diagnosis not present

## 2018-03-28 DIAGNOSIS — D631 Anemia in chronic kidney disease: Secondary | ICD-10-CM | POA: Diagnosis not present

## 2018-03-28 DIAGNOSIS — D509 Iron deficiency anemia, unspecified: Secondary | ICD-10-CM | POA: Diagnosis not present

## 2018-03-28 DIAGNOSIS — N2581 Secondary hyperparathyroidism of renal origin: Secondary | ICD-10-CM | POA: Diagnosis not present

## 2018-03-31 DIAGNOSIS — N186 End stage renal disease: Secondary | ICD-10-CM | POA: Diagnosis not present

## 2018-03-31 DIAGNOSIS — D509 Iron deficiency anemia, unspecified: Secondary | ICD-10-CM | POA: Diagnosis not present

## 2018-03-31 DIAGNOSIS — N2581 Secondary hyperparathyroidism of renal origin: Secondary | ICD-10-CM | POA: Diagnosis not present

## 2018-03-31 DIAGNOSIS — D631 Anemia in chronic kidney disease: Secondary | ICD-10-CM | POA: Diagnosis not present

## 2018-04-02 DIAGNOSIS — D631 Anemia in chronic kidney disease: Secondary | ICD-10-CM | POA: Diagnosis not present

## 2018-04-02 DIAGNOSIS — D509 Iron deficiency anemia, unspecified: Secondary | ICD-10-CM | POA: Diagnosis not present

## 2018-04-02 DIAGNOSIS — N2581 Secondary hyperparathyroidism of renal origin: Secondary | ICD-10-CM | POA: Diagnosis not present

## 2018-04-02 DIAGNOSIS — N186 End stage renal disease: Secondary | ICD-10-CM | POA: Diagnosis not present

## 2018-04-03 ENCOUNTER — Ambulatory Visit: Payer: Medicare Other

## 2018-04-04 DIAGNOSIS — D631 Anemia in chronic kidney disease: Secondary | ICD-10-CM | POA: Diagnosis not present

## 2018-04-04 DIAGNOSIS — D509 Iron deficiency anemia, unspecified: Secondary | ICD-10-CM | POA: Diagnosis not present

## 2018-04-04 DIAGNOSIS — N186 End stage renal disease: Secondary | ICD-10-CM | POA: Diagnosis not present

## 2018-04-04 DIAGNOSIS — N2581 Secondary hyperparathyroidism of renal origin: Secondary | ICD-10-CM | POA: Diagnosis not present

## 2018-04-07 DIAGNOSIS — D631 Anemia in chronic kidney disease: Secondary | ICD-10-CM | POA: Diagnosis not present

## 2018-04-07 DIAGNOSIS — D509 Iron deficiency anemia, unspecified: Secondary | ICD-10-CM | POA: Diagnosis not present

## 2018-04-07 DIAGNOSIS — N186 End stage renal disease: Secondary | ICD-10-CM | POA: Diagnosis not present

## 2018-04-07 DIAGNOSIS — N2581 Secondary hyperparathyroidism of renal origin: Secondary | ICD-10-CM | POA: Diagnosis not present

## 2018-04-08 ENCOUNTER — Ambulatory Visit (INDEPENDENT_AMBULATORY_CARE_PROVIDER_SITE_OTHER): Payer: Medicare Other

## 2018-04-08 DIAGNOSIS — Z23 Encounter for immunization: Secondary | ICD-10-CM | POA: Diagnosis present

## 2018-04-09 DIAGNOSIS — D631 Anemia in chronic kidney disease: Secondary | ICD-10-CM | POA: Diagnosis not present

## 2018-04-09 DIAGNOSIS — N186 End stage renal disease: Secondary | ICD-10-CM | POA: Diagnosis not present

## 2018-04-09 DIAGNOSIS — N2581 Secondary hyperparathyroidism of renal origin: Secondary | ICD-10-CM | POA: Diagnosis not present

## 2018-04-09 DIAGNOSIS — D509 Iron deficiency anemia, unspecified: Secondary | ICD-10-CM | POA: Diagnosis not present

## 2018-04-11 DIAGNOSIS — D509 Iron deficiency anemia, unspecified: Secondary | ICD-10-CM | POA: Diagnosis not present

## 2018-04-11 DIAGNOSIS — D631 Anemia in chronic kidney disease: Secondary | ICD-10-CM | POA: Diagnosis not present

## 2018-04-11 DIAGNOSIS — N186 End stage renal disease: Secondary | ICD-10-CM | POA: Diagnosis not present

## 2018-04-11 DIAGNOSIS — N2581 Secondary hyperparathyroidism of renal origin: Secondary | ICD-10-CM | POA: Diagnosis not present

## 2018-04-12 DIAGNOSIS — I120 Hypertensive chronic kidney disease with stage 5 chronic kidney disease or end stage renal disease: Secondary | ICD-10-CM | POA: Diagnosis not present

## 2018-04-12 DIAGNOSIS — Z992 Dependence on renal dialysis: Secondary | ICD-10-CM | POA: Diagnosis not present

## 2018-04-12 DIAGNOSIS — N186 End stage renal disease: Secondary | ICD-10-CM | POA: Diagnosis not present

## 2018-04-14 DIAGNOSIS — N2581 Secondary hyperparathyroidism of renal origin: Secondary | ICD-10-CM | POA: Diagnosis not present

## 2018-04-14 DIAGNOSIS — D631 Anemia in chronic kidney disease: Secondary | ICD-10-CM | POA: Diagnosis not present

## 2018-04-14 DIAGNOSIS — N186 End stage renal disease: Secondary | ICD-10-CM | POA: Diagnosis not present

## 2018-04-14 DIAGNOSIS — D509 Iron deficiency anemia, unspecified: Secondary | ICD-10-CM | POA: Diagnosis not present

## 2018-04-16 DIAGNOSIS — N2581 Secondary hyperparathyroidism of renal origin: Secondary | ICD-10-CM | POA: Diagnosis not present

## 2018-04-16 DIAGNOSIS — N186 End stage renal disease: Secondary | ICD-10-CM | POA: Diagnosis not present

## 2018-04-16 DIAGNOSIS — D631 Anemia in chronic kidney disease: Secondary | ICD-10-CM | POA: Diagnosis not present

## 2018-04-16 DIAGNOSIS — D509 Iron deficiency anemia, unspecified: Secondary | ICD-10-CM | POA: Diagnosis not present

## 2018-04-18 DIAGNOSIS — D631 Anemia in chronic kidney disease: Secondary | ICD-10-CM | POA: Diagnosis not present

## 2018-04-18 DIAGNOSIS — N2581 Secondary hyperparathyroidism of renal origin: Secondary | ICD-10-CM | POA: Diagnosis not present

## 2018-04-18 DIAGNOSIS — D509 Iron deficiency anemia, unspecified: Secondary | ICD-10-CM | POA: Diagnosis not present

## 2018-04-18 DIAGNOSIS — N186 End stage renal disease: Secondary | ICD-10-CM | POA: Diagnosis not present

## 2018-04-21 DIAGNOSIS — N2581 Secondary hyperparathyroidism of renal origin: Secondary | ICD-10-CM | POA: Diagnosis not present

## 2018-04-21 DIAGNOSIS — D509 Iron deficiency anemia, unspecified: Secondary | ICD-10-CM | POA: Diagnosis not present

## 2018-04-21 DIAGNOSIS — N186 End stage renal disease: Secondary | ICD-10-CM | POA: Diagnosis not present

## 2018-04-21 DIAGNOSIS — D631 Anemia in chronic kidney disease: Secondary | ICD-10-CM | POA: Diagnosis not present

## 2018-04-23 DIAGNOSIS — N186 End stage renal disease: Secondary | ICD-10-CM | POA: Diagnosis not present

## 2018-04-23 DIAGNOSIS — N2581 Secondary hyperparathyroidism of renal origin: Secondary | ICD-10-CM | POA: Diagnosis not present

## 2018-04-23 DIAGNOSIS — D509 Iron deficiency anemia, unspecified: Secondary | ICD-10-CM | POA: Diagnosis not present

## 2018-04-23 DIAGNOSIS — D631 Anemia in chronic kidney disease: Secondary | ICD-10-CM | POA: Diagnosis not present

## 2018-04-25 DIAGNOSIS — N2581 Secondary hyperparathyroidism of renal origin: Secondary | ICD-10-CM | POA: Diagnosis not present

## 2018-04-25 DIAGNOSIS — D631 Anemia in chronic kidney disease: Secondary | ICD-10-CM | POA: Diagnosis not present

## 2018-04-25 DIAGNOSIS — N186 End stage renal disease: Secondary | ICD-10-CM | POA: Diagnosis not present

## 2018-04-25 DIAGNOSIS — D509 Iron deficiency anemia, unspecified: Secondary | ICD-10-CM | POA: Diagnosis not present

## 2018-04-28 DIAGNOSIS — N186 End stage renal disease: Secondary | ICD-10-CM | POA: Diagnosis not present

## 2018-04-28 DIAGNOSIS — D631 Anemia in chronic kidney disease: Secondary | ICD-10-CM | POA: Diagnosis not present

## 2018-04-28 DIAGNOSIS — D509 Iron deficiency anemia, unspecified: Secondary | ICD-10-CM | POA: Diagnosis not present

## 2018-04-28 DIAGNOSIS — N2581 Secondary hyperparathyroidism of renal origin: Secondary | ICD-10-CM | POA: Diagnosis not present

## 2018-04-30 DIAGNOSIS — D509 Iron deficiency anemia, unspecified: Secondary | ICD-10-CM | POA: Diagnosis not present

## 2018-04-30 DIAGNOSIS — D631 Anemia in chronic kidney disease: Secondary | ICD-10-CM | POA: Diagnosis not present

## 2018-04-30 DIAGNOSIS — N186 End stage renal disease: Secondary | ICD-10-CM | POA: Diagnosis not present

## 2018-04-30 DIAGNOSIS — N2581 Secondary hyperparathyroidism of renal origin: Secondary | ICD-10-CM | POA: Diagnosis not present

## 2018-05-02 DIAGNOSIS — D509 Iron deficiency anemia, unspecified: Secondary | ICD-10-CM | POA: Diagnosis not present

## 2018-05-02 DIAGNOSIS — N2581 Secondary hyperparathyroidism of renal origin: Secondary | ICD-10-CM | POA: Diagnosis not present

## 2018-05-02 DIAGNOSIS — N186 End stage renal disease: Secondary | ICD-10-CM | POA: Diagnosis not present

## 2018-05-02 DIAGNOSIS — D631 Anemia in chronic kidney disease: Secondary | ICD-10-CM | POA: Diagnosis not present

## 2018-05-05 DIAGNOSIS — N2581 Secondary hyperparathyroidism of renal origin: Secondary | ICD-10-CM | POA: Diagnosis not present

## 2018-05-05 DIAGNOSIS — D631 Anemia in chronic kidney disease: Secondary | ICD-10-CM | POA: Diagnosis not present

## 2018-05-05 DIAGNOSIS — N186 End stage renal disease: Secondary | ICD-10-CM | POA: Diagnosis not present

## 2018-05-05 DIAGNOSIS — D509 Iron deficiency anemia, unspecified: Secondary | ICD-10-CM | POA: Diagnosis not present

## 2018-05-06 MED FILL — BIKTARVY 50-200-25 MG TABS: 50-200-25 | 30 days supply | Qty: 30 | Fill #1

## 2018-05-07 DIAGNOSIS — D631 Anemia in chronic kidney disease: Secondary | ICD-10-CM | POA: Diagnosis not present

## 2018-05-07 DIAGNOSIS — D509 Iron deficiency anemia, unspecified: Secondary | ICD-10-CM | POA: Diagnosis not present

## 2018-05-07 DIAGNOSIS — N186 End stage renal disease: Secondary | ICD-10-CM | POA: Diagnosis not present

## 2018-05-07 DIAGNOSIS — N2581 Secondary hyperparathyroidism of renal origin: Secondary | ICD-10-CM | POA: Diagnosis not present

## 2018-05-09 DIAGNOSIS — N186 End stage renal disease: Secondary | ICD-10-CM | POA: Diagnosis not present

## 2018-05-09 DIAGNOSIS — D631 Anemia in chronic kidney disease: Secondary | ICD-10-CM | POA: Diagnosis not present

## 2018-05-09 DIAGNOSIS — D509 Iron deficiency anemia, unspecified: Secondary | ICD-10-CM | POA: Diagnosis not present

## 2018-05-09 DIAGNOSIS — N2581 Secondary hyperparathyroidism of renal origin: Secondary | ICD-10-CM | POA: Diagnosis not present

## 2018-05-12 DIAGNOSIS — Z992 Dependence on renal dialysis: Secondary | ICD-10-CM | POA: Diagnosis not present

## 2018-05-12 DIAGNOSIS — N2581 Secondary hyperparathyroidism of renal origin: Secondary | ICD-10-CM | POA: Diagnosis not present

## 2018-05-12 DIAGNOSIS — D509 Iron deficiency anemia, unspecified: Secondary | ICD-10-CM | POA: Diagnosis not present

## 2018-05-12 DIAGNOSIS — N186 End stage renal disease: Secondary | ICD-10-CM | POA: Diagnosis not present

## 2018-05-12 DIAGNOSIS — I120 Hypertensive chronic kidney disease with stage 5 chronic kidney disease or end stage renal disease: Secondary | ICD-10-CM | POA: Diagnosis not present

## 2018-05-14 DIAGNOSIS — N2581 Secondary hyperparathyroidism of renal origin: Secondary | ICD-10-CM | POA: Diagnosis not present

## 2018-05-14 DIAGNOSIS — D509 Iron deficiency anemia, unspecified: Secondary | ICD-10-CM | POA: Diagnosis not present

## 2018-05-14 DIAGNOSIS — N186 End stage renal disease: Secondary | ICD-10-CM | POA: Diagnosis not present

## 2018-05-16 DIAGNOSIS — D509 Iron deficiency anemia, unspecified: Secondary | ICD-10-CM | POA: Diagnosis not present

## 2018-05-16 DIAGNOSIS — N186 End stage renal disease: Secondary | ICD-10-CM | POA: Diagnosis not present

## 2018-05-16 DIAGNOSIS — N2581 Secondary hyperparathyroidism of renal origin: Secondary | ICD-10-CM | POA: Diagnosis not present

## 2018-05-19 DIAGNOSIS — N2581 Secondary hyperparathyroidism of renal origin: Secondary | ICD-10-CM | POA: Diagnosis not present

## 2018-05-19 DIAGNOSIS — N186 End stage renal disease: Secondary | ICD-10-CM | POA: Diagnosis not present

## 2018-05-19 DIAGNOSIS — D509 Iron deficiency anemia, unspecified: Secondary | ICD-10-CM | POA: Diagnosis not present

## 2018-05-21 DIAGNOSIS — N186 End stage renal disease: Secondary | ICD-10-CM | POA: Diagnosis not present

## 2018-05-21 DIAGNOSIS — N2581 Secondary hyperparathyroidism of renal origin: Secondary | ICD-10-CM | POA: Diagnosis not present

## 2018-05-21 DIAGNOSIS — D509 Iron deficiency anemia, unspecified: Secondary | ICD-10-CM | POA: Diagnosis not present

## 2018-05-23 DIAGNOSIS — N2581 Secondary hyperparathyroidism of renal origin: Secondary | ICD-10-CM | POA: Diagnosis not present

## 2018-05-23 DIAGNOSIS — D509 Iron deficiency anemia, unspecified: Secondary | ICD-10-CM | POA: Diagnosis not present

## 2018-05-23 DIAGNOSIS — N186 End stage renal disease: Secondary | ICD-10-CM | POA: Diagnosis not present

## 2018-05-26 DIAGNOSIS — N2581 Secondary hyperparathyroidism of renal origin: Secondary | ICD-10-CM | POA: Diagnosis not present

## 2018-05-26 DIAGNOSIS — N186 End stage renal disease: Secondary | ICD-10-CM | POA: Diagnosis not present

## 2018-05-26 DIAGNOSIS — D509 Iron deficiency anemia, unspecified: Secondary | ICD-10-CM | POA: Diagnosis not present

## 2018-05-28 DIAGNOSIS — N2581 Secondary hyperparathyroidism of renal origin: Secondary | ICD-10-CM | POA: Diagnosis not present

## 2018-05-28 DIAGNOSIS — D509 Iron deficiency anemia, unspecified: Secondary | ICD-10-CM | POA: Diagnosis not present

## 2018-05-28 DIAGNOSIS — N186 End stage renal disease: Secondary | ICD-10-CM | POA: Diagnosis not present

## 2018-05-30 DIAGNOSIS — N2581 Secondary hyperparathyroidism of renal origin: Secondary | ICD-10-CM | POA: Diagnosis not present

## 2018-05-30 DIAGNOSIS — N186 End stage renal disease: Secondary | ICD-10-CM | POA: Diagnosis not present

## 2018-05-30 DIAGNOSIS — D509 Iron deficiency anemia, unspecified: Secondary | ICD-10-CM | POA: Diagnosis not present

## 2018-06-02 DIAGNOSIS — N186 End stage renal disease: Secondary | ICD-10-CM | POA: Diagnosis not present

## 2018-06-02 DIAGNOSIS — D509 Iron deficiency anemia, unspecified: Secondary | ICD-10-CM | POA: Diagnosis not present

## 2018-06-02 DIAGNOSIS — N2581 Secondary hyperparathyroidism of renal origin: Secondary | ICD-10-CM | POA: Diagnosis not present

## 2018-06-04 DIAGNOSIS — N2581 Secondary hyperparathyroidism of renal origin: Secondary | ICD-10-CM | POA: Diagnosis not present

## 2018-06-04 DIAGNOSIS — N186 End stage renal disease: Secondary | ICD-10-CM | POA: Diagnosis not present

## 2018-06-04 DIAGNOSIS — D509 Iron deficiency anemia, unspecified: Secondary | ICD-10-CM | POA: Diagnosis not present

## 2018-06-05 ENCOUNTER — Encounter: Payer: Self-pay | Admitting: Family

## 2018-06-05 ENCOUNTER — Ambulatory Visit (INDEPENDENT_AMBULATORY_CARE_PROVIDER_SITE_OTHER): Payer: Medicare Other | Admitting: Family

## 2018-06-05 ENCOUNTER — Encounter: Payer: Self-pay | Admitting: *Deleted

## 2018-06-05 VITALS — BP 145/100 | HR 69 | Temp 98.7°F | Ht 61.0 in | Wt 193.0 lb

## 2018-06-05 DIAGNOSIS — Z21 Asymptomatic human immunodeficiency virus [HIV] infection status: Secondary | ICD-10-CM

## 2018-06-05 DIAGNOSIS — Z5181 Encounter for therapeutic drug level monitoring: Secondary | ICD-10-CM

## 2018-06-05 DIAGNOSIS — Z23 Encounter for immunization: Secondary | ICD-10-CM | POA: Diagnosis not present

## 2018-06-05 DIAGNOSIS — I10 Essential (primary) hypertension: Secondary | ICD-10-CM

## 2018-06-05 LAB — COMPREHENSIVE METABOLIC PANEL
AG Ratio: 1.3 (calc) (ref 1.0–2.5)
ALBUMIN MSPROF: 4.4 g/dL (ref 3.6–5.1)
ALKALINE PHOSPHATASE (APISO): 57 U/L (ref 33–115)
ALT: 8 U/L (ref 6–29)
AST: 15 U/L (ref 10–30)
BILIRUBIN TOTAL: 0.5 mg/dL (ref 0.2–1.2)
BUN/Creatinine Ratio: 3 (calc) — ABNORMAL LOW (ref 6–22)
BUN: 25 mg/dL (ref 7–25)
CO2: 34 mmol/L — ABNORMAL HIGH (ref 20–32)
Calcium: 10 mg/dL (ref 8.6–10.2)
Chloride: 92 mmol/L — ABNORMAL LOW (ref 98–110)
Creat: 7.4 mg/dL — ABNORMAL HIGH (ref 0.50–1.10)
Globulin: 3.3 g/dL (calc) (ref 1.9–3.7)
Glucose, Bld: 76 mg/dL (ref 65–99)
POTASSIUM: 5.2 mmol/L (ref 3.5–5.3)
Sodium: 136 mmol/L (ref 135–146)
TOTAL PROTEIN: 7.7 g/dL (ref 6.1–8.1)

## 2018-06-05 LAB — CBC
HEMATOCRIT: 42.4 % (ref 35.0–45.0)
Hemoglobin: 14.7 g/dL (ref 11.7–15.5)
MCH: 34.1 pg — ABNORMAL HIGH (ref 27.0–33.0)
MCHC: 34.7 g/dL (ref 32.0–36.0)
MCV: 98.4 fL (ref 80.0–100.0)
MPV: 9.5 fL (ref 7.5–12.5)
PLATELETS: 194 10*3/uL (ref 140–400)
RBC: 4.31 10*6/uL (ref 3.80–5.10)
RDW: 12.5 % (ref 11.0–15.0)
WBC: 6 10*3/uL (ref 3.8–10.8)

## 2018-06-05 MED ORDER — BICTEGRAVIR-EMTRICITAB-TENOFOV 50-200-25 MG PO TABS: 1.0000 | ORAL_TABLET | Freq: Every day | ORAL | 5 refills | Status: DC

## 2018-06-05 NOTE — Assessment & Plan Note (Signed)
Tiffany Golden is doing very well with her current medication regimen of Biktarvy with no adverse side effects and has been adherent with no missed dosages. She has no signs/symptoms of opportunistic infection through history or physical exam. Will obtain blood work today. Meningococcal vaccination updated today. She will be due for 3rd Hepatitis B vaccination in October and will schedule a nurse visit. Continue current dosage of Biktarvy. Plan for follow up in 6 months with lab work 1-2 weeks prior.

## 2018-06-05 NOTE — Assessment & Plan Note (Signed)
Blood pressure elevated today. She indicates her blood pressures at home have been well controlled. Encouraged to continue to take her metoprolol as prescribed and monitor for increases in average blood pressure.

## 2018-06-05 NOTE — Patient Instructions (Addendum)
Nice to see you!  We will check your blood work today.  Please continue to take your medication as prescribed.  Enjoy your vacation at the beach!  Please make a nurse appointment for October to receive your hepatitis B and also meningococcal vaccinations (information is provided below).  Plan to follow-up for an office visit with myself or Dr. Tommy Medal in 6 months with lab work 1-2 weeks prior to your office visit.    Meningococcal Polysaccharide Vaccine injection What is this medicine? MENINGOCOCCAL POLYSACCHARIDE VACCINE (muh ning goh KOK kal vak SEEN) is a vaccine to protect from bacterial meningitis. This vaccine does not contain live bacteria. It will not cause a meningitis. This medicine may be used for other purposes; ask your health care provider or pharmacist if you have questions. COMMON BRAND NAME(S): Menomune [id number]/C/[id number] What should I tell my health care provider before I take this medicine? They need to know if you have any of these conditions: -fever or infection -history of Guillain-Barre syndrome -immune system problems -an unusual or allergic reaction to meningococcal vaccine, latex, other medicines, foods, dyes, or preservatives -pregnant or trying to get pregnant -breast-feeding How should I use this medicine? This medicine is for injection under the skin. It is given by a health care professional in a hospital or clinic setting. A copy of the Vaccine Information Statements will be given before each vaccination. Read this sheet carefully each time. The sheet may change frequently. Talk to your pediatrician regarding the use of this medicine in children. While this drug may be prescribed for children as young as 71 years of age for selected conditions, precautions do apply. Overdosage: If you think you have taken too much of this medicine contact a poison control center or emergency room at once. NOTE: This medicine is only for you. Do not share this medicine with  others. What if I miss a dose? This does not apply. What may interact with this medicine? -adalimumab -anakinra -etanercept -infliximab -medicines for organ transplant -medicines to treat cancer -other vaccines -some medicines for arthritis -steroid medicines like prednisone or cortisone This list may not describe all possible interactions. Give your health care provider a list of all the medicines, herbs, non-prescription drugs, or dietary supplements you use. Also tell them if you smoke, drink alcohol, or use illegal drugs. Some items may interact with your medicine. What should I watch for while using this medicine? This vaccine, like all vaccines, may not fully protect everyone. Report any side effects that are worrisome to your doctor right away. What side effects may I notice from receiving this medicine? Side effects that you should report to your doctor or health care professional as soon as possible: -allergic reactions like skin rash, itching or hives, swelling of the face, lips, or tongue -breathing problems -feeling faint or lightheaded, falls -fever over 102 degrees F -muscle weakness -unusual drooping or paralysis of face Side effects that usually do not require medical attention (report to your doctor or health care professional if they continue or are bothersome): -chills -diarrhea -headache -loss of appetite -muscle aches and pains -pain at site where injected -tired This list may not describe all possible side effects. Call your doctor for medical advice about side effects. You may report side effects to FDA at 1-800-FDA-1088. Where should I keep my medicine? This drug is given in a hospital or clinic and will not be stored at home. NOTE: This sheet is a summary. It may not cover all possible information.  If you have questions about this medicine, talk to your doctor, pharmacist, or health care provider.  2018 Elsevier/Gold Standard (2015-12-01 11:32:00)

## 2018-06-05 NOTE — Progress Notes (Signed)
Subjective:    Patient ID: Tiffany Golden, female    DOB: [birth year], 42 y.o.   MRN: [id number]  Chief Complaint  Patient presents with  . HIV Positive/AIDS    HPI:  Tiffany Golden is a 42 y.o. female who presents today for a routine office follow up for her HIV disease.   Tiffany Golden was last seen in the office on 03/04/18 with a viral load of 85 and a CD4 count of 360. She was maintained on Biktarvy with no adverse side effects. RPR was non-reactive on 01/30/18. She is due for meningococcal vaccination today and the 3rd dose of Hepatitis B in October.   Tiffany Golden reports taking her medication as prescribed and denies adverse side effects. She has not missed any doses and has no problems taking or obtaining her medication. Denies fevers, chills, night sweats, headaches, changes in vision, neck pain/stiffness, nausea, diarrhea, vomiting, lesions or rashes.  Medication is currently mailed to her by [employer]   She has plans to vacation in [place] in the coming weeks.    Allergies  Allergen Reactions  . Fortaz [Ceftazidime Sodium In D5w] Rash    Head-toe  . Sulfa Antibiotics Hives  . Vancomycin Rash    Head-toe      Outpatient Medications Prior to Visit  Medication Sig Dispense Refill  . B Complex-C-Folic Acid (RENA-VITE RX) 1 MG TABS Take 1 tablet by mouth daily.    . calcium carbonate (TUMS - DOSED IN MG ELEMENTAL CALCIUM) 500 MG chewable tablet Chew 1-2 tablets by mouth daily as needed for indigestion or heartburn.    . metoprolol tartrate (LOPRESSOR) 25 MG tablet Take 12.5 mg by mouth 2 (two) times daily as needed (for blood pressure). Except takes none on dialysis days (Tuesday, Thursday, and Sunday).    . sertraline (ZOLOFT) 100 MG tablet Take 1 tablet (100 mg total) by mouth daily. 30 tablet 5  . bictegravir-emtricitabine-tenofovir AF (BIKTARVY) 50-200-25 MG TABS tablet Take 1 tablet by mouth daily. 30 tablet 2   No facility-administered medications prior to  visit.      Past Medical History:  Diagnosis Date  . Anemia   . Chronic kidney disease   . Complication of anesthesia   . Dialysis patient [facility])    mon, wed 49  . ESRD (end stage renal disease) ([place])   . HIV infection ([place])   . Hypertension   . PONV (postoperative nausea and vomiting)   . Stroke ([place])   . TIA (transient ischemic attack)    Hx:of     Past Surgical History:  Procedure Laterality Date  . ARTERIOVENOUS GRAFT PLACEMENT  09/11/11   left arm  . CESAREAN SECTION    . DILITATION & CURRETTAGE/HYSTROSCOPY WITH NOVASURE ABLATION N/A 05/03/2015   Procedure: DILATATION/HYSTEROSCOPY WITH NOVASURE ABLATION; uterine cavity length 5.0 cm, uterine cavity width 3.8 cm, power 105 watts; time 1 minute 12 seconds;  Surgeon: Jonnie Kind, MD;  Location: [facility];  Service: Gynecology;  Laterality: N/A;  . INSERTION OF DIALYSIS CATHETER Right 09/01/2013   Procedure: INSERTION OF DIALYSIS CATHETER Right Internal Jugular;  Surgeon: Elam Dutch, MD;  Location: [place];  Service: Vascular;  Laterality: Right;  . PATCH ANGIOPLASTY Left 09/01/2013   Procedure: PATCH ANGIOPLASTY;  Surgeon: Elam Dutch, MD;  Location: [place];  Service: Vascular;  Laterality: Left;  . REVISON OF ARTERIOVENOUS FISTULA Left [id number]/[id number]   Procedure: Plication left arm fistula;  Surgeon: Elam Dutch,  MD;  Location: [facility];  Service: Vascular;  Laterality: Left;       Review of Systems  Constitutional: Negative for appetite change, chills, diaphoresis, fatigue, fever and unexpected weight change.  Eyes:       Negative for acute change in vision  Respiratory: Negative for chest tightness, shortness of breath and wheezing.   Cardiovascular: Negative for chest pain.  Gastrointestinal: Negative for diarrhea, nausea and vomiting.  Genitourinary: Negative for dysuria, pelvic pain and vaginal discharge.  Musculoskeletal: Negative for neck pain and neck stiffness.  Skin: Negative for rash.    Neurological: Negative for seizures, syncope, weakness and headaches.  Hematological: Negative for adenopathy. Does not bruise/bleed easily.  Psychiatric/Behavioral: Negative for hallucinations.      Objective:    BP (!) 145/100   Pulse 69   Temp 98.7 F (37.1 C) (Oral)   Ht 5\' 1"  (1.549 m)   Wt 193 lb (87.5 kg)   BMI 36.47 kg/m  Nursing note and vital signs reviewed.  Physical Exam  Constitutional: She is oriented to person, place, and time. She appears well-developed. No distress.  HENT:  Mouth/Throat: Oropharynx is clear and moist.  Eyes: Conjunctivae are normal.  Neck: Neck supple.  Cardiovascular: Normal rate, regular rhythm, normal heart sounds and intact distal pulses. Exam reveals no gallop and no friction rub.  No murmur heard. Pulmonary/Chest: Effort normal and breath sounds normal. No respiratory distress. She has no wheezes. She has no rales. She exhibits no tenderness.  Abdominal: Soft. Bowel sounds are normal. There is no tenderness.  Lymphadenopathy:    She has no cervical adenopathy.  Neurological: She is alert and oriented to person, place, and time.  Skin: Skin is warm and dry. No rash noted.  Psychiatric: She has a normal mood and affect. Her behavior is normal. Judgment and thought content normal.       Assessment & Plan:   Problem List Items Addressed This Visit      Cardiovascular and Mediastinum   Hypertension    Blood pressure elevated today. She indicates her blood pressures at home have been well controlled. Encouraged to continue to take her metoprolol as prescribed and monitor for increases in average blood pressure.        Other   HIV (human immunodeficiency virus infection) ([place]) - Primary    Tiffany Golden is doing very well with her current medication regimen of Biktarvy with no adverse side effects and has been adherent with no missed dosages. She has no signs/symptoms of opportunistic infection through history or physical exam. Will obtain  blood work today. Meningococcal vaccination updated today. She will be due for 3rd Hepatitis B vaccination in October and will schedule a nurse visit. Continue current dosage of Biktarvy. Plan for follow up in 6 months with lab work 1-2 weeks prior.       Relevant Medications   bictegravir-emtricitabine-tenofovir AF (BIKTARVY) 50-200-25 MG TABS tablet   Other Relevant Orders   HIV 1 RNA quant-no reflex-bld   T-helper cell (CD4)- ([facility] clinic only)   CBC   Comprehensive metabolic panel   HIV 1 RNA quant-no reflex-bld   T-helper cell (CD4)- ([facility] clinic only)   Lipid panel   RPR   MENINGOCOCCAL MCV4O (Completed)    Other Visit Diagnoses    Therapeutic drug monitoring       Relevant Orders   HIV 1 RNA quant-no reflex-bld   T-helper cell (CD4)- ([facility] clinic only)   CBC   Comprehensive metabolic panel  HIV 1 RNA quant-no reflex-bld   T-helper cell (CD4)- ([facility] clinic only)   Lipid panel   RPR   Need for meningitis vaccination       Relevant Orders   MENINGOCOCCAL MCV4O (Completed)       I am having Tiffany Golden maintain her RENA-VITE RX, metoprolol tartrate, calcium carbonate, sertraline, and bictegravir-emtricitabine-tenofovir AF.   Meds ordered this encounter  Medications  . bictegravir-emtricitabine-tenofovir AF (BIKTARVY) 50-200-25 MG TABS tablet    Sig: Take 1 tablet by mouth daily.    Dispense:  30 tablet    Refill:  5    Order Specific Question:   Supervising Provider    Answer:   Carlyle Basques [[id number]]     Follow-up: Return in about 6 months (around 12/06/2018), or if symptoms worsen or fail to improve.   Tiffany Piedra, MSN, [facility]

## 2018-06-06 DIAGNOSIS — N2581 Secondary hyperparathyroidism of renal origin: Secondary | ICD-10-CM | POA: Diagnosis not present

## 2018-06-06 DIAGNOSIS — N186 End stage renal disease: Secondary | ICD-10-CM | POA: Diagnosis not present

## 2018-06-06 DIAGNOSIS — D509 Iron deficiency anemia, unspecified: Secondary | ICD-10-CM | POA: Diagnosis not present

## 2018-06-06 LAB — T-HELPER CELL (CD4) - (RCID CLINIC ONLY)
CD4 % Helper T Cell: 22 % — ABNORMAL LOW (ref 33–55)
CD4 T Cell Abs: 390 /uL — ABNORMAL LOW (ref 400–2700)

## 2018-06-09 ENCOUNTER — Encounter (INDEPENDENT_AMBULATORY_CARE_PROVIDER_SITE_OTHER): Payer: Self-pay

## 2018-06-09 DIAGNOSIS — D509 Iron deficiency anemia, unspecified: Secondary | ICD-10-CM | POA: Diagnosis not present

## 2018-06-09 DIAGNOSIS — N186 End stage renal disease: Secondary | ICD-10-CM | POA: Diagnosis not present

## 2018-06-09 DIAGNOSIS — N2581 Secondary hyperparathyroidism of renal origin: Secondary | ICD-10-CM | POA: Diagnosis not present

## 2018-06-09 LAB — HIV-1 RNA QUANT-NO REFLEX-BLD
HIV 1 RNA Quant: <20 copies/mL
HIV-1 RNA QUANT, LOG: NOT DETECTED {Log_copies}/mL

## 2018-06-11 DIAGNOSIS — N2581 Secondary hyperparathyroidism of renal origin: Secondary | ICD-10-CM | POA: Diagnosis not present

## 2018-06-11 DIAGNOSIS — D509 Iron deficiency anemia, unspecified: Secondary | ICD-10-CM | POA: Diagnosis not present

## 2018-06-11 DIAGNOSIS — N186 End stage renal disease: Secondary | ICD-10-CM | POA: Diagnosis not present

## 2018-06-12 ENCOUNTER — Telehealth: Payer: Self-pay | Admitting: Licensed Clinical Social Worker

## 2018-06-12 DIAGNOSIS — I120 Hypertensive chronic kidney disease with stage 5 chronic kidney disease or end stage renal disease: Secondary | ICD-10-CM | POA: Diagnosis not present

## 2018-06-12 DIAGNOSIS — Z992 Dependence on renal dialysis: Secondary | ICD-10-CM | POA: Diagnosis not present

## 2018-06-12 DIAGNOSIS — N186 End stage renal disease: Secondary | ICD-10-CM | POA: Diagnosis not present

## 2018-06-13 DIAGNOSIS — D509 Iron deficiency anemia, unspecified: Secondary | ICD-10-CM | POA: Diagnosis not present

## 2018-06-13 DIAGNOSIS — N186 End stage renal disease: Secondary | ICD-10-CM | POA: Diagnosis not present

## 2018-06-13 DIAGNOSIS — N2581 Secondary hyperparathyroidism of renal origin: Secondary | ICD-10-CM | POA: Diagnosis not present

## 2018-06-16 DIAGNOSIS — N2581 Secondary hyperparathyroidism of renal origin: Secondary | ICD-10-CM | POA: Diagnosis not present

## 2018-06-16 DIAGNOSIS — D509 Iron deficiency anemia, unspecified: Secondary | ICD-10-CM | POA: Diagnosis not present

## 2018-06-16 DIAGNOSIS — N186 End stage renal disease: Secondary | ICD-10-CM | POA: Diagnosis not present

## 2018-06-18 DIAGNOSIS — D509 Iron deficiency anemia, unspecified: Secondary | ICD-10-CM | POA: Diagnosis not present

## 2018-06-18 DIAGNOSIS — N186 End stage renal disease: Secondary | ICD-10-CM | POA: Diagnosis not present

## 2018-06-18 DIAGNOSIS — N2581 Secondary hyperparathyroidism of renal origin: Secondary | ICD-10-CM | POA: Diagnosis not present

## 2018-06-18 MED FILL — BIKTARVY 50-200-25 MG TABS: 50-200-25 | 30 days supply | Qty: 30 | Fill #2

## 2018-06-20 DIAGNOSIS — N2581 Secondary hyperparathyroidism of renal origin: Secondary | ICD-10-CM | POA: Diagnosis not present

## 2018-06-20 DIAGNOSIS — D509 Iron deficiency anemia, unspecified: Secondary | ICD-10-CM | POA: Diagnosis not present

## 2018-06-20 DIAGNOSIS — N186 End stage renal disease: Secondary | ICD-10-CM | POA: Diagnosis not present

## 2018-06-23 DIAGNOSIS — D509 Iron deficiency anemia, unspecified: Secondary | ICD-10-CM | POA: Diagnosis not present

## 2018-06-23 DIAGNOSIS — N2581 Secondary hyperparathyroidism of renal origin: Secondary | ICD-10-CM | POA: Diagnosis not present

## 2018-06-23 DIAGNOSIS — N186 End stage renal disease: Secondary | ICD-10-CM | POA: Diagnosis not present

## 2018-06-25 DIAGNOSIS — N2581 Secondary hyperparathyroidism of renal origin: Secondary | ICD-10-CM | POA: Diagnosis not present

## 2018-06-25 DIAGNOSIS — N186 End stage renal disease: Secondary | ICD-10-CM | POA: Diagnosis not present

## 2018-06-25 DIAGNOSIS — D509 Iron deficiency anemia, unspecified: Secondary | ICD-10-CM | POA: Diagnosis not present

## 2018-06-27 DIAGNOSIS — D509 Iron deficiency anemia, unspecified: Secondary | ICD-10-CM | POA: Diagnosis not present

## 2018-06-27 DIAGNOSIS — N186 End stage renal disease: Secondary | ICD-10-CM | POA: Diagnosis not present

## 2018-06-27 DIAGNOSIS — N2581 Secondary hyperparathyroidism of renal origin: Secondary | ICD-10-CM | POA: Diagnosis not present

## 2018-06-30 DIAGNOSIS — N2581 Secondary hyperparathyroidism of renal origin: Secondary | ICD-10-CM | POA: Diagnosis not present

## 2018-06-30 DIAGNOSIS — D509 Iron deficiency anemia, unspecified: Secondary | ICD-10-CM | POA: Diagnosis not present

## 2018-06-30 DIAGNOSIS — N186 End stage renal disease: Secondary | ICD-10-CM | POA: Diagnosis not present

## 2018-07-02 DIAGNOSIS — N186 End stage renal disease: Secondary | ICD-10-CM | POA: Diagnosis not present

## 2018-07-02 DIAGNOSIS — D509 Iron deficiency anemia, unspecified: Secondary | ICD-10-CM | POA: Diagnosis not present

## 2018-07-02 DIAGNOSIS — N2581 Secondary hyperparathyroidism of renal origin: Secondary | ICD-10-CM | POA: Diagnosis not present

## 2018-07-04 DIAGNOSIS — D509 Iron deficiency anemia, unspecified: Secondary | ICD-10-CM | POA: Diagnosis not present

## 2018-07-04 DIAGNOSIS — N2581 Secondary hyperparathyroidism of renal origin: Secondary | ICD-10-CM | POA: Diagnosis not present

## 2018-07-04 DIAGNOSIS — N186 End stage renal disease: Secondary | ICD-10-CM | POA: Diagnosis not present

## 2018-07-07 DIAGNOSIS — N186 End stage renal disease: Secondary | ICD-10-CM | POA: Diagnosis not present

## 2018-07-07 DIAGNOSIS — N2581 Secondary hyperparathyroidism of renal origin: Secondary | ICD-10-CM | POA: Diagnosis not present

## 2018-07-07 DIAGNOSIS — D509 Iron deficiency anemia, unspecified: Secondary | ICD-10-CM | POA: Diagnosis not present

## 2018-07-09 DIAGNOSIS — D509 Iron deficiency anemia, unspecified: Secondary | ICD-10-CM | POA: Diagnosis not present

## 2018-07-09 DIAGNOSIS — N186 End stage renal disease: Secondary | ICD-10-CM | POA: Diagnosis not present

## 2018-07-09 DIAGNOSIS — N2581 Secondary hyperparathyroidism of renal origin: Secondary | ICD-10-CM | POA: Diagnosis not present

## 2018-07-11 DIAGNOSIS — D509 Iron deficiency anemia, unspecified: Secondary | ICD-10-CM | POA: Diagnosis not present

## 2018-07-11 DIAGNOSIS — N2581 Secondary hyperparathyroidism of renal origin: Secondary | ICD-10-CM | POA: Diagnosis not present

## 2018-07-11 DIAGNOSIS — N186 End stage renal disease: Secondary | ICD-10-CM | POA: Diagnosis not present

## 2018-07-13 DIAGNOSIS — Z992 Dependence on renal dialysis: Secondary | ICD-10-CM | POA: Diagnosis not present

## 2018-07-13 DIAGNOSIS — N186 End stage renal disease: Secondary | ICD-10-CM | POA: Diagnosis not present

## 2018-07-13 DIAGNOSIS — I120 Hypertensive chronic kidney disease with stage 5 chronic kidney disease or end stage renal disease: Secondary | ICD-10-CM | POA: Diagnosis not present

## 2018-07-14 DIAGNOSIS — N2581 Secondary hyperparathyroidism of renal origin: Secondary | ICD-10-CM | POA: Diagnosis not present

## 2018-07-14 DIAGNOSIS — D509 Iron deficiency anemia, unspecified: Secondary | ICD-10-CM | POA: Diagnosis not present

## 2018-07-14 DIAGNOSIS — N186 End stage renal disease: Secondary | ICD-10-CM | POA: Diagnosis not present

## 2018-07-16 DIAGNOSIS — D509 Iron deficiency anemia, unspecified: Secondary | ICD-10-CM | POA: Diagnosis not present

## 2018-07-16 DIAGNOSIS — N186 End stage renal disease: Secondary | ICD-10-CM | POA: Diagnosis not present

## 2018-07-16 DIAGNOSIS — N2581 Secondary hyperparathyroidism of renal origin: Secondary | ICD-10-CM | POA: Diagnosis not present

## 2018-07-18 DIAGNOSIS — N2581 Secondary hyperparathyroidism of renal origin: Secondary | ICD-10-CM | POA: Diagnosis not present

## 2018-07-18 DIAGNOSIS — D509 Iron deficiency anemia, unspecified: Secondary | ICD-10-CM | POA: Diagnosis not present

## 2018-07-18 DIAGNOSIS — N186 End stage renal disease: Secondary | ICD-10-CM | POA: Diagnosis not present

## 2018-07-21 DIAGNOSIS — D509 Iron deficiency anemia, unspecified: Secondary | ICD-10-CM | POA: Diagnosis not present

## 2018-07-21 DIAGNOSIS — N186 End stage renal disease: Secondary | ICD-10-CM | POA: Diagnosis not present

## 2018-07-21 DIAGNOSIS — N2581 Secondary hyperparathyroidism of renal origin: Secondary | ICD-10-CM | POA: Diagnosis not present

## 2018-07-23 DIAGNOSIS — N186 End stage renal disease: Secondary | ICD-10-CM | POA: Diagnosis not present

## 2018-07-23 DIAGNOSIS — N2581 Secondary hyperparathyroidism of renal origin: Secondary | ICD-10-CM | POA: Diagnosis not present

## 2018-07-23 DIAGNOSIS — D509 Iron deficiency anemia, unspecified: Secondary | ICD-10-CM | POA: Diagnosis not present

## 2018-07-25 DIAGNOSIS — N2581 Secondary hyperparathyroidism of renal origin: Secondary | ICD-10-CM | POA: Diagnosis not present

## 2018-07-25 DIAGNOSIS — D509 Iron deficiency anemia, unspecified: Secondary | ICD-10-CM | POA: Diagnosis not present

## 2018-07-25 DIAGNOSIS — N186 End stage renal disease: Secondary | ICD-10-CM | POA: Diagnosis not present

## 2018-07-25 MED FILL — BIKTARVY 50-200-25 MG TABS: 50-200-25 | 30 days supply | Qty: 30 | Fill #0

## 2018-07-28 DIAGNOSIS — D509 Iron deficiency anemia, unspecified: Secondary | ICD-10-CM | POA: Diagnosis not present

## 2018-07-28 DIAGNOSIS — N186 End stage renal disease: Secondary | ICD-10-CM | POA: Diagnosis not present

## 2018-07-28 DIAGNOSIS — N2581 Secondary hyperparathyroidism of renal origin: Secondary | ICD-10-CM | POA: Diagnosis not present

## 2018-07-30 DIAGNOSIS — D509 Iron deficiency anemia, unspecified: Secondary | ICD-10-CM | POA: Diagnosis not present

## 2018-07-30 DIAGNOSIS — N2581 Secondary hyperparathyroidism of renal origin: Secondary | ICD-10-CM | POA: Diagnosis not present

## 2018-07-30 DIAGNOSIS — N186 End stage renal disease: Secondary | ICD-10-CM | POA: Diagnosis not present

## 2018-08-01 DIAGNOSIS — N2581 Secondary hyperparathyroidism of renal origin: Secondary | ICD-10-CM | POA: Diagnosis not present

## 2018-08-01 DIAGNOSIS — N186 End stage renal disease: Secondary | ICD-10-CM | POA: Diagnosis not present

## 2018-08-01 DIAGNOSIS — D509 Iron deficiency anemia, unspecified: Secondary | ICD-10-CM | POA: Diagnosis not present

## 2018-08-04 DIAGNOSIS — N2581 Secondary hyperparathyroidism of renal origin: Secondary | ICD-10-CM | POA: Diagnosis not present

## 2018-08-04 DIAGNOSIS — D509 Iron deficiency anemia, unspecified: Secondary | ICD-10-CM | POA: Diagnosis not present

## 2018-08-04 DIAGNOSIS — N186 End stage renal disease: Secondary | ICD-10-CM | POA: Diagnosis not present

## 2018-08-06 DIAGNOSIS — N2581 Secondary hyperparathyroidism of renal origin: Secondary | ICD-10-CM | POA: Diagnosis not present

## 2018-08-06 DIAGNOSIS — N186 End stage renal disease: Secondary | ICD-10-CM | POA: Diagnosis not present

## 2018-08-06 DIAGNOSIS — D509 Iron deficiency anemia, unspecified: Secondary | ICD-10-CM | POA: Diagnosis not present

## 2018-08-08 DIAGNOSIS — D509 Iron deficiency anemia, unspecified: Secondary | ICD-10-CM | POA: Diagnosis not present

## 2018-08-08 DIAGNOSIS — N186 End stage renal disease: Secondary | ICD-10-CM | POA: Diagnosis not present

## 2018-08-08 DIAGNOSIS — N2581 Secondary hyperparathyroidism of renal origin: Secondary | ICD-10-CM | POA: Diagnosis not present

## 2018-08-11 DIAGNOSIS — D509 Iron deficiency anemia, unspecified: Secondary | ICD-10-CM | POA: Diagnosis not present

## 2018-08-11 DIAGNOSIS — N186 End stage renal disease: Secondary | ICD-10-CM | POA: Diagnosis not present

## 2018-08-11 DIAGNOSIS — N2581 Secondary hyperparathyroidism of renal origin: Secondary | ICD-10-CM | POA: Diagnosis not present

## 2018-08-12 DIAGNOSIS — Z992 Dependence on renal dialysis: Secondary | ICD-10-CM | POA: Diagnosis not present

## 2018-08-12 DIAGNOSIS — N186 End stage renal disease: Secondary | ICD-10-CM | POA: Diagnosis not present

## 2018-08-12 DIAGNOSIS — I120 Hypertensive chronic kidney disease with stage 5 chronic kidney disease or end stage renal disease: Secondary | ICD-10-CM | POA: Diagnosis not present

## 2018-08-13 DIAGNOSIS — N2581 Secondary hyperparathyroidism of renal origin: Secondary | ICD-10-CM | POA: Diagnosis not present

## 2018-08-13 DIAGNOSIS — Z23 Encounter for immunization: Secondary | ICD-10-CM | POA: Diagnosis not present

## 2018-08-13 DIAGNOSIS — D509 Iron deficiency anemia, unspecified: Secondary | ICD-10-CM | POA: Diagnosis not present

## 2018-08-13 DIAGNOSIS — N186 End stage renal disease: Secondary | ICD-10-CM | POA: Diagnosis not present

## 2018-08-15 DIAGNOSIS — Z23 Encounter for immunization: Secondary | ICD-10-CM | POA: Diagnosis not present

## 2018-08-15 DIAGNOSIS — N2581 Secondary hyperparathyroidism of renal origin: Secondary | ICD-10-CM | POA: Diagnosis not present

## 2018-08-15 DIAGNOSIS — D509 Iron deficiency anemia, unspecified: Secondary | ICD-10-CM | POA: Diagnosis not present

## 2018-08-15 DIAGNOSIS — N186 End stage renal disease: Secondary | ICD-10-CM | POA: Diagnosis not present

## 2018-08-18 DIAGNOSIS — Z23 Encounter for immunization: Secondary | ICD-10-CM | POA: Diagnosis not present

## 2018-08-18 DIAGNOSIS — N2581 Secondary hyperparathyroidism of renal origin: Secondary | ICD-10-CM | POA: Diagnosis not present

## 2018-08-18 DIAGNOSIS — D509 Iron deficiency anemia, unspecified: Secondary | ICD-10-CM | POA: Diagnosis not present

## 2018-08-18 DIAGNOSIS — N186 End stage renal disease: Secondary | ICD-10-CM | POA: Diagnosis not present

## 2018-08-20 DIAGNOSIS — Z23 Encounter for immunization: Secondary | ICD-10-CM | POA: Diagnosis not present

## 2018-08-20 DIAGNOSIS — N186 End stage renal disease: Secondary | ICD-10-CM | POA: Diagnosis not present

## 2018-08-20 DIAGNOSIS — N2581 Secondary hyperparathyroidism of renal origin: Secondary | ICD-10-CM | POA: Diagnosis not present

## 2018-08-20 DIAGNOSIS — D509 Iron deficiency anemia, unspecified: Secondary | ICD-10-CM | POA: Diagnosis not present

## 2018-08-22 DIAGNOSIS — Z23 Encounter for immunization: Secondary | ICD-10-CM | POA: Diagnosis not present

## 2018-08-22 DIAGNOSIS — D509 Iron deficiency anemia, unspecified: Secondary | ICD-10-CM | POA: Diagnosis not present

## 2018-08-22 DIAGNOSIS — N186 End stage renal disease: Secondary | ICD-10-CM | POA: Diagnosis not present

## 2018-08-22 DIAGNOSIS — N2581 Secondary hyperparathyroidism of renal origin: Secondary | ICD-10-CM | POA: Diagnosis not present

## 2018-08-25 DIAGNOSIS — Z23 Encounter for immunization: Secondary | ICD-10-CM | POA: Diagnosis not present

## 2018-08-25 DIAGNOSIS — N186 End stage renal disease: Secondary | ICD-10-CM | POA: Diagnosis not present

## 2018-08-25 DIAGNOSIS — D509 Iron deficiency anemia, unspecified: Secondary | ICD-10-CM | POA: Diagnosis not present

## 2018-08-25 DIAGNOSIS — N2581 Secondary hyperparathyroidism of renal origin: Secondary | ICD-10-CM | POA: Diagnosis not present

## 2018-08-26 MED FILL — BIKTARVY 50-200-25 MG TABS: 50-200-25 | 30 days supply | Qty: 30 | Fill #1

## 2018-08-27 DIAGNOSIS — D509 Iron deficiency anemia, unspecified: Secondary | ICD-10-CM | POA: Diagnosis not present

## 2018-08-27 DIAGNOSIS — N2581 Secondary hyperparathyroidism of renal origin: Secondary | ICD-10-CM | POA: Diagnosis not present

## 2018-08-27 DIAGNOSIS — N186 End stage renal disease: Secondary | ICD-10-CM | POA: Diagnosis not present

## 2018-08-27 DIAGNOSIS — Z23 Encounter for immunization: Secondary | ICD-10-CM | POA: Diagnosis not present

## 2018-08-29 DIAGNOSIS — N2581 Secondary hyperparathyroidism of renal origin: Secondary | ICD-10-CM | POA: Diagnosis not present

## 2018-08-29 DIAGNOSIS — N186 End stage renal disease: Secondary | ICD-10-CM | POA: Diagnosis not present

## 2018-08-29 DIAGNOSIS — D509 Iron deficiency anemia, unspecified: Secondary | ICD-10-CM | POA: Diagnosis not present

## 2018-08-29 DIAGNOSIS — Z23 Encounter for immunization: Secondary | ICD-10-CM | POA: Diagnosis not present

## 2018-09-01 DIAGNOSIS — N186 End stage renal disease: Secondary | ICD-10-CM | POA: Diagnosis not present

## 2018-09-01 DIAGNOSIS — N2581 Secondary hyperparathyroidism of renal origin: Secondary | ICD-10-CM | POA: Diagnosis not present

## 2018-09-01 DIAGNOSIS — D509 Iron deficiency anemia, unspecified: Secondary | ICD-10-CM | POA: Diagnosis not present

## 2018-09-01 DIAGNOSIS — Z23 Encounter for immunization: Secondary | ICD-10-CM | POA: Diagnosis not present

## 2018-09-03 DIAGNOSIS — N186 End stage renal disease: Secondary | ICD-10-CM | POA: Diagnosis not present

## 2018-09-03 DIAGNOSIS — N2581 Secondary hyperparathyroidism of renal origin: Secondary | ICD-10-CM | POA: Diagnosis not present

## 2018-09-03 DIAGNOSIS — D509 Iron deficiency anemia, unspecified: Secondary | ICD-10-CM | POA: Diagnosis not present

## 2018-09-03 DIAGNOSIS — Z23 Encounter for immunization: Secondary | ICD-10-CM | POA: Diagnosis not present

## 2018-09-05 DIAGNOSIS — N186 End stage renal disease: Secondary | ICD-10-CM | POA: Diagnosis not present

## 2018-09-05 DIAGNOSIS — D509 Iron deficiency anemia, unspecified: Secondary | ICD-10-CM | POA: Diagnosis not present

## 2018-09-05 DIAGNOSIS — Z23 Encounter for immunization: Secondary | ICD-10-CM | POA: Diagnosis not present

## 2018-09-05 DIAGNOSIS — N2581 Secondary hyperparathyroidism of renal origin: Secondary | ICD-10-CM | POA: Diagnosis not present

## 2018-09-08 DIAGNOSIS — N186 End stage renal disease: Secondary | ICD-10-CM | POA: Diagnosis not present

## 2018-09-08 DIAGNOSIS — N2581 Secondary hyperparathyroidism of renal origin: Secondary | ICD-10-CM | POA: Diagnosis not present

## 2018-09-08 DIAGNOSIS — Z23 Encounter for immunization: Secondary | ICD-10-CM | POA: Diagnosis not present

## 2018-09-08 DIAGNOSIS — D509 Iron deficiency anemia, unspecified: Secondary | ICD-10-CM | POA: Diagnosis not present

## 2018-09-10 DIAGNOSIS — N186 End stage renal disease: Secondary | ICD-10-CM | POA: Diagnosis not present

## 2018-09-10 DIAGNOSIS — N2581 Secondary hyperparathyroidism of renal origin: Secondary | ICD-10-CM | POA: Diagnosis not present

## 2018-09-10 DIAGNOSIS — D509 Iron deficiency anemia, unspecified: Secondary | ICD-10-CM | POA: Diagnosis not present

## 2018-09-10 DIAGNOSIS — Z23 Encounter for immunization: Secondary | ICD-10-CM | POA: Diagnosis not present

## 2018-09-12 DIAGNOSIS — I120 Hypertensive chronic kidney disease with stage 5 chronic kidney disease or end stage renal disease: Secondary | ICD-10-CM | POA: Diagnosis not present

## 2018-09-12 DIAGNOSIS — N2581 Secondary hyperparathyroidism of renal origin: Secondary | ICD-10-CM | POA: Diagnosis not present

## 2018-09-12 DIAGNOSIS — D509 Iron deficiency anemia, unspecified: Secondary | ICD-10-CM | POA: Diagnosis not present

## 2018-09-12 DIAGNOSIS — N186 End stage renal disease: Secondary | ICD-10-CM | POA: Diagnosis not present

## 2018-09-12 DIAGNOSIS — Z992 Dependence on renal dialysis: Secondary | ICD-10-CM | POA: Diagnosis not present

## 2018-09-15 DIAGNOSIS — N2581 Secondary hyperparathyroidism of renal origin: Secondary | ICD-10-CM | POA: Diagnosis not present

## 2018-09-15 DIAGNOSIS — D509 Iron deficiency anemia, unspecified: Secondary | ICD-10-CM | POA: Diagnosis not present

## 2018-09-15 DIAGNOSIS — N186 End stage renal disease: Secondary | ICD-10-CM | POA: Diagnosis not present

## 2018-09-17 DIAGNOSIS — N2581 Secondary hyperparathyroidism of renal origin: Secondary | ICD-10-CM | POA: Diagnosis not present

## 2018-09-17 DIAGNOSIS — D509 Iron deficiency anemia, unspecified: Secondary | ICD-10-CM | POA: Diagnosis not present

## 2018-09-17 DIAGNOSIS — N186 End stage renal disease: Secondary | ICD-10-CM | POA: Diagnosis not present

## 2018-09-19 DIAGNOSIS — D509 Iron deficiency anemia, unspecified: Secondary | ICD-10-CM | POA: Diagnosis not present

## 2018-09-19 DIAGNOSIS — N186 End stage renal disease: Secondary | ICD-10-CM | POA: Diagnosis not present

## 2018-09-19 DIAGNOSIS — N2581 Secondary hyperparathyroidism of renal origin: Secondary | ICD-10-CM | POA: Diagnosis not present

## 2018-09-22 DIAGNOSIS — N186 End stage renal disease: Secondary | ICD-10-CM | POA: Diagnosis not present

## 2018-09-22 DIAGNOSIS — D509 Iron deficiency anemia, unspecified: Secondary | ICD-10-CM | POA: Diagnosis not present

## 2018-09-22 DIAGNOSIS — N2581 Secondary hyperparathyroidism of renal origin: Secondary | ICD-10-CM | POA: Diagnosis not present

## 2018-09-24 DIAGNOSIS — N186 End stage renal disease: Secondary | ICD-10-CM | POA: Diagnosis not present

## 2018-09-24 DIAGNOSIS — D509 Iron deficiency anemia, unspecified: Secondary | ICD-10-CM | POA: Diagnosis not present

## 2018-09-24 DIAGNOSIS — N2581 Secondary hyperparathyroidism of renal origin: Secondary | ICD-10-CM | POA: Diagnosis not present

## 2018-09-25 MED FILL — BIKTARVY 50-200-25 MG TABS: 50-200-25 | 30 days supply | Qty: 30 | Fill #2

## 2018-09-26 DIAGNOSIS — D509 Iron deficiency anemia, unspecified: Secondary | ICD-10-CM | POA: Diagnosis not present

## 2018-09-26 DIAGNOSIS — N186 End stage renal disease: Secondary | ICD-10-CM | POA: Diagnosis not present

## 2018-09-26 DIAGNOSIS — N2581 Secondary hyperparathyroidism of renal origin: Secondary | ICD-10-CM | POA: Diagnosis not present

## 2018-09-29 DIAGNOSIS — N2581 Secondary hyperparathyroidism of renal origin: Secondary | ICD-10-CM | POA: Diagnosis not present

## 2018-09-29 DIAGNOSIS — D509 Iron deficiency anemia, unspecified: Secondary | ICD-10-CM | POA: Diagnosis not present

## 2018-09-29 DIAGNOSIS — N186 End stage renal disease: Secondary | ICD-10-CM | POA: Diagnosis not present

## 2018-10-01 DIAGNOSIS — N2581 Secondary hyperparathyroidism of renal origin: Secondary | ICD-10-CM | POA: Diagnosis not present

## 2018-10-01 DIAGNOSIS — N186 End stage renal disease: Secondary | ICD-10-CM | POA: Diagnosis not present

## 2018-10-01 DIAGNOSIS — D509 Iron deficiency anemia, unspecified: Secondary | ICD-10-CM | POA: Diagnosis not present

## 2018-10-03 DIAGNOSIS — N2581 Secondary hyperparathyroidism of renal origin: Secondary | ICD-10-CM | POA: Diagnosis not present

## 2018-10-03 DIAGNOSIS — D509 Iron deficiency anemia, unspecified: Secondary | ICD-10-CM | POA: Diagnosis not present

## 2018-10-03 DIAGNOSIS — N186 End stage renal disease: Secondary | ICD-10-CM | POA: Diagnosis not present

## 2018-10-05 DIAGNOSIS — N186 End stage renal disease: Secondary | ICD-10-CM | POA: Diagnosis not present

## 2018-10-05 DIAGNOSIS — D509 Iron deficiency anemia, unspecified: Secondary | ICD-10-CM | POA: Diagnosis not present

## 2018-10-05 DIAGNOSIS — N2581 Secondary hyperparathyroidism of renal origin: Secondary | ICD-10-CM | POA: Diagnosis not present

## 2018-10-07 DIAGNOSIS — N186 End stage renal disease: Secondary | ICD-10-CM | POA: Diagnosis not present

## 2018-10-07 DIAGNOSIS — N2581 Secondary hyperparathyroidism of renal origin: Secondary | ICD-10-CM | POA: Diagnosis not present

## 2018-10-07 DIAGNOSIS — D509 Iron deficiency anemia, unspecified: Secondary | ICD-10-CM | POA: Diagnosis not present

## 2018-10-10 DIAGNOSIS — N2581 Secondary hyperparathyroidism of renal origin: Secondary | ICD-10-CM | POA: Diagnosis not present

## 2018-10-10 DIAGNOSIS — D509 Iron deficiency anemia, unspecified: Secondary | ICD-10-CM | POA: Diagnosis not present

## 2018-10-10 DIAGNOSIS — N186 End stage renal disease: Secondary | ICD-10-CM | POA: Diagnosis not present

## 2018-10-12 DIAGNOSIS — Z992 Dependence on renal dialysis: Secondary | ICD-10-CM | POA: Diagnosis not present

## 2018-10-12 DIAGNOSIS — I120 Hypertensive chronic kidney disease with stage 5 chronic kidney disease or end stage renal disease: Secondary | ICD-10-CM | POA: Diagnosis not present

## 2018-10-12 DIAGNOSIS — N186 End stage renal disease: Secondary | ICD-10-CM | POA: Diagnosis not present

## 2018-10-13 DIAGNOSIS — N186 End stage renal disease: Secondary | ICD-10-CM | POA: Diagnosis not present

## 2018-10-13 DIAGNOSIS — D509 Iron deficiency anemia, unspecified: Secondary | ICD-10-CM | POA: Diagnosis not present

## 2018-10-13 DIAGNOSIS — N2581 Secondary hyperparathyroidism of renal origin: Secondary | ICD-10-CM | POA: Diagnosis not present

## 2018-10-15 DIAGNOSIS — N2581 Secondary hyperparathyroidism of renal origin: Secondary | ICD-10-CM | POA: Diagnosis not present

## 2018-10-15 DIAGNOSIS — D509 Iron deficiency anemia, unspecified: Secondary | ICD-10-CM | POA: Diagnosis not present

## 2018-10-15 DIAGNOSIS — N186 End stage renal disease: Secondary | ICD-10-CM | POA: Diagnosis not present

## 2018-10-17 DIAGNOSIS — D509 Iron deficiency anemia, unspecified: Secondary | ICD-10-CM | POA: Diagnosis not present

## 2018-10-17 DIAGNOSIS — N2581 Secondary hyperparathyroidism of renal origin: Secondary | ICD-10-CM | POA: Diagnosis not present

## 2018-10-17 DIAGNOSIS — N186 End stage renal disease: Secondary | ICD-10-CM | POA: Diagnosis not present

## 2018-10-20 DIAGNOSIS — N2581 Secondary hyperparathyroidism of renal origin: Secondary | ICD-10-CM | POA: Diagnosis not present

## 2018-10-20 DIAGNOSIS — D509 Iron deficiency anemia, unspecified: Secondary | ICD-10-CM | POA: Diagnosis not present

## 2018-10-20 DIAGNOSIS — N186 End stage renal disease: Secondary | ICD-10-CM | POA: Diagnosis not present

## 2018-10-22 DIAGNOSIS — N2581 Secondary hyperparathyroidism of renal origin: Secondary | ICD-10-CM | POA: Diagnosis not present

## 2018-10-22 DIAGNOSIS — N186 End stage renal disease: Secondary | ICD-10-CM | POA: Diagnosis not present

## 2018-10-22 DIAGNOSIS — D509 Iron deficiency anemia, unspecified: Secondary | ICD-10-CM | POA: Diagnosis not present

## 2018-10-23 MED FILL — BIKTARVY 50-200-25 MG TABS: 50-200-25 | 30 days supply | Qty: 30 | Fill #3

## 2018-10-24 DIAGNOSIS — D509 Iron deficiency anemia, unspecified: Secondary | ICD-10-CM | POA: Diagnosis not present

## 2018-10-24 DIAGNOSIS — N2581 Secondary hyperparathyroidism of renal origin: Secondary | ICD-10-CM | POA: Diagnosis not present

## 2018-10-24 DIAGNOSIS — N186 End stage renal disease: Secondary | ICD-10-CM | POA: Diagnosis not present

## 2018-10-27 DIAGNOSIS — D509 Iron deficiency anemia, unspecified: Secondary | ICD-10-CM | POA: Diagnosis not present

## 2018-10-27 DIAGNOSIS — N186 End stage renal disease: Secondary | ICD-10-CM | POA: Diagnosis not present

## 2018-10-27 DIAGNOSIS — N2581 Secondary hyperparathyroidism of renal origin: Secondary | ICD-10-CM | POA: Diagnosis not present

## 2018-10-29 DIAGNOSIS — D509 Iron deficiency anemia, unspecified: Secondary | ICD-10-CM | POA: Diagnosis not present

## 2018-10-29 DIAGNOSIS — N2581 Secondary hyperparathyroidism of renal origin: Secondary | ICD-10-CM | POA: Diagnosis not present

## 2018-10-29 DIAGNOSIS — N186 End stage renal disease: Secondary | ICD-10-CM | POA: Diagnosis not present

## 2018-10-31 DIAGNOSIS — D509 Iron deficiency anemia, unspecified: Secondary | ICD-10-CM | POA: Diagnosis not present

## 2018-10-31 DIAGNOSIS — N2581 Secondary hyperparathyroidism of renal origin: Secondary | ICD-10-CM | POA: Diagnosis not present

## 2018-10-31 DIAGNOSIS — N186 End stage renal disease: Secondary | ICD-10-CM | POA: Diagnosis not present

## 2018-11-02 DIAGNOSIS — D509 Iron deficiency anemia, unspecified: Secondary | ICD-10-CM | POA: Diagnosis not present

## 2018-11-02 DIAGNOSIS — N186 End stage renal disease: Secondary | ICD-10-CM | POA: Diagnosis not present

## 2018-11-02 DIAGNOSIS — N2581 Secondary hyperparathyroidism of renal origin: Secondary | ICD-10-CM | POA: Diagnosis not present

## 2018-11-04 DIAGNOSIS — N2581 Secondary hyperparathyroidism of renal origin: Secondary | ICD-10-CM | POA: Diagnosis not present

## 2018-11-04 DIAGNOSIS — N186 End stage renal disease: Secondary | ICD-10-CM | POA: Diagnosis not present

## 2018-11-04 DIAGNOSIS — D509 Iron deficiency anemia, unspecified: Secondary | ICD-10-CM | POA: Diagnosis not present

## 2018-11-07 DIAGNOSIS — D509 Iron deficiency anemia, unspecified: Secondary | ICD-10-CM | POA: Diagnosis not present

## 2018-11-07 DIAGNOSIS — N186 End stage renal disease: Secondary | ICD-10-CM | POA: Diagnosis not present

## 2018-11-07 DIAGNOSIS — N2581 Secondary hyperparathyroidism of renal origin: Secondary | ICD-10-CM | POA: Diagnosis not present

## 2018-11-09 DIAGNOSIS — N186 End stage renal disease: Secondary | ICD-10-CM | POA: Diagnosis not present

## 2018-11-09 DIAGNOSIS — D509 Iron deficiency anemia, unspecified: Secondary | ICD-10-CM | POA: Diagnosis not present

## 2018-11-09 DIAGNOSIS — N2581 Secondary hyperparathyroidism of renal origin: Secondary | ICD-10-CM | POA: Diagnosis not present

## 2018-11-11 DIAGNOSIS — D509 Iron deficiency anemia, unspecified: Secondary | ICD-10-CM | POA: Diagnosis not present

## 2018-11-11 DIAGNOSIS — N2581 Secondary hyperparathyroidism of renal origin: Secondary | ICD-10-CM | POA: Diagnosis not present

## 2018-11-11 DIAGNOSIS — N186 End stage renal disease: Secondary | ICD-10-CM | POA: Diagnosis not present

## 2018-11-12 DIAGNOSIS — I120 Hypertensive chronic kidney disease with stage 5 chronic kidney disease or end stage renal disease: Secondary | ICD-10-CM | POA: Diagnosis not present

## 2018-11-12 DIAGNOSIS — Z992 Dependence on renal dialysis: Secondary | ICD-10-CM | POA: Diagnosis not present

## 2018-11-12 DIAGNOSIS — N186 End stage renal disease: Secondary | ICD-10-CM | POA: Diagnosis not present

## 2018-11-14 DIAGNOSIS — N186 End stage renal disease: Secondary | ICD-10-CM | POA: Diagnosis not present

## 2018-11-14 DIAGNOSIS — D509 Iron deficiency anemia, unspecified: Secondary | ICD-10-CM | POA: Diagnosis not present

## 2018-11-14 DIAGNOSIS — N2581 Secondary hyperparathyroidism of renal origin: Secondary | ICD-10-CM | POA: Diagnosis not present

## 2018-11-17 DIAGNOSIS — N2581 Secondary hyperparathyroidism of renal origin: Secondary | ICD-10-CM | POA: Diagnosis not present

## 2018-11-17 DIAGNOSIS — D509 Iron deficiency anemia, unspecified: Secondary | ICD-10-CM | POA: Diagnosis not present

## 2018-11-17 DIAGNOSIS — N186 End stage renal disease: Secondary | ICD-10-CM | POA: Diagnosis not present

## 2018-11-19 ENCOUNTER — Other Ambulatory Visit: Payer: Self-pay

## 2018-11-19 DIAGNOSIS — N186 End stage renal disease: Secondary | ICD-10-CM | POA: Diagnosis not present

## 2018-11-19 DIAGNOSIS — Z21 Asymptomatic human immunodeficiency virus [HIV] infection status: Secondary | ICD-10-CM

## 2018-11-19 DIAGNOSIS — D509 Iron deficiency anemia, unspecified: Secondary | ICD-10-CM | POA: Diagnosis not present

## 2018-11-19 DIAGNOSIS — Z113 Encounter for screening for infections with a predominantly sexual mode of transmission: Secondary | ICD-10-CM

## 2018-11-19 DIAGNOSIS — N2581 Secondary hyperparathyroidism of renal origin: Secondary | ICD-10-CM | POA: Diagnosis not present

## 2018-11-20 ENCOUNTER — Other Ambulatory Visit: Payer: Medicare Other

## 2018-11-20 DIAGNOSIS — Z5181 Encounter for therapeutic drug level monitoring: Secondary | ICD-10-CM

## 2018-11-20 DIAGNOSIS — Z21 Asymptomatic human immunodeficiency virus [HIV] infection status: Secondary | ICD-10-CM | POA: Diagnosis not present

## 2018-11-21 ENCOUNTER — Telehealth: Payer: Self-pay | Admitting: Behavioral Health

## 2018-11-21 DIAGNOSIS — N186 End stage renal disease: Secondary | ICD-10-CM | POA: Diagnosis not present

## 2018-11-21 DIAGNOSIS — N2581 Secondary hyperparathyroidism of renal origin: Secondary | ICD-10-CM | POA: Diagnosis not present

## 2018-11-21 DIAGNOSIS — D509 Iron deficiency anemia, unspecified: Secondary | ICD-10-CM | POA: Diagnosis not present

## 2018-11-21 LAB — T-HELPER CELL (CD4) - (RCID CLINIC ONLY)
CD4 % Helper T Cell: 19 % — ABNORMAL LOW (ref 33–55)
CD4 T CELL ABS: 290 /uL — AB (ref 400–2700)

## 2018-11-21 NOTE — Telephone Encounter (Signed)
[facility] called with urgent lab results for [facility]  Creatinine was 8.53. patient has ESRD dialysis MWF Pricilla Riffle RN

## 2018-11-21 NOTE — Telephone Encounter (Signed)
Noted.  No action needed

## 2018-11-22 LAB — CBC WITH DIFFERENTIAL/PLATELET
ABSOLUTE MONOCYTES: 546 {cells}/uL (ref 200–950)
Basophils Absolute: 49 cells/uL (ref 0–200)
Basophils Relative: 0.7 %
EOS ABS: 140 {cells}/uL (ref 15–500)
Eosinophils Relative: 2 %
HCT: 42.1 % (ref 35.0–45.0)
HEMOGLOBIN: 15.3 g/dL (ref 11.7–15.5)
LYMPHS ABS: 1568 {cells}/uL (ref 850–3900)
MCH: 36.1 pg — AB (ref 27.0–33.0)
MCHC: 36.3 g/dL — ABNORMAL HIGH (ref 32.0–36.0)
MCV: 99.3 fL (ref 80.0–100.0)
MPV: 9.2 fL (ref 7.5–12.5)
Monocytes Relative: 7.8 %
Neutro Abs: 4697 cells/uL (ref 1500–7800)
Neutrophils Relative %: 67.1 %
Platelets: 256 10*3/uL (ref 140–400)
RBC: 4.24 10*6/uL (ref 3.80–5.10)
RDW: 12.5 % (ref 11.0–15.0)
Total Lymphocyte: 22.4 %
WBC: 7 10*3/uL (ref 3.8–10.8)

## 2018-11-22 LAB — LIPID PANEL
Cholesterol: 254 mg/dL — ABNORMAL HIGH (ref <200)
HDL: 48 mg/dL — ABNORMAL LOW (ref >50)
LDL Cholesterol (Calc): 179 mg/dL (calc) — ABNORMAL HIGH
NON-HDL CHOLESTEROL (CALC): 206 mg/dL — AB (ref <130)
Total CHOL/HDL Ratio: 5.3 (calc) — ABNORMAL HIGH (ref <5.0)
Triglycerides: 134 mg/dL (ref <150)

## 2018-11-22 LAB — COMPREHENSIVE METABOLIC PANEL
AG RATIO: 1.3 (calc) (ref 1.0–2.5)
ALBUMIN MSPROF: 4.3 g/dL (ref 3.6–5.1)
ALT: 12 U/L (ref 6–29)
AST: 18 U/L (ref 10–30)
Alkaline phosphatase (APISO): 63 U/L (ref 33–115)
BUN/Creatinine Ratio: 4 (calc) — ABNORMAL LOW (ref 6–22)
BUN: 33 mg/dL — ABNORMAL HIGH (ref 7–25)
CALCIUM: 10.1 mg/dL (ref 8.6–10.2)
CHLORIDE: 93 mmol/L — AB (ref 98–110)
CO2: 30 mmol/L (ref 20–32)
Creat: 8.53 mg/dL — ABNORMAL HIGH (ref 0.50–1.10)
GLOBULIN: 3.4 g/dL (ref 1.9–3.7)
Glucose, Bld: 84 mg/dL (ref 65–99)
POTASSIUM: 5.7 mmol/L — AB (ref 3.5–5.3)
SODIUM: 135 mmol/L (ref 135–146)
TOTAL PROTEIN: 7.7 g/dL (ref 6.1–8.1)
Total Bilirubin: 0.5 mg/dL (ref 0.2–1.2)

## 2018-11-22 LAB — HIV-1 RNA QUANT-NO REFLEX-BLD
HIV 1 RNA Quant: <20 copies/mL — AB
HIV-1 RNA Quant, Log: <1.3 Log copies/mL — AB

## 2018-11-22 LAB — RPR: RPR Ser Ql: NONREACTIVE

## 2018-11-24 DIAGNOSIS — N186 End stage renal disease: Secondary | ICD-10-CM | POA: Diagnosis not present

## 2018-11-24 DIAGNOSIS — D509 Iron deficiency anemia, unspecified: Secondary | ICD-10-CM | POA: Diagnosis not present

## 2018-11-24 DIAGNOSIS — N2581 Secondary hyperparathyroidism of renal origin: Secondary | ICD-10-CM | POA: Diagnosis not present

## 2018-11-26 DIAGNOSIS — N2581 Secondary hyperparathyroidism of renal origin: Secondary | ICD-10-CM | POA: Diagnosis not present

## 2018-11-26 DIAGNOSIS — N186 End stage renal disease: Secondary | ICD-10-CM | POA: Diagnosis not present

## 2018-11-26 DIAGNOSIS — D509 Iron deficiency anemia, unspecified: Secondary | ICD-10-CM | POA: Diagnosis not present

## 2018-11-26 MED FILL — BIKTARVY 50-200-25 MG TABS: 50-200-25 | 30 days supply | Qty: 30 | Fill #4

## 2018-11-28 DIAGNOSIS — N186 End stage renal disease: Secondary | ICD-10-CM | POA: Diagnosis not present

## 2018-11-28 DIAGNOSIS — N2581 Secondary hyperparathyroidism of renal origin: Secondary | ICD-10-CM | POA: Diagnosis not present

## 2018-11-28 DIAGNOSIS — D509 Iron deficiency anemia, unspecified: Secondary | ICD-10-CM | POA: Diagnosis not present

## 2018-12-01 DIAGNOSIS — N186 End stage renal disease: Secondary | ICD-10-CM | POA: Diagnosis not present

## 2018-12-01 DIAGNOSIS — N2581 Secondary hyperparathyroidism of renal origin: Secondary | ICD-10-CM | POA: Diagnosis not present

## 2018-12-01 DIAGNOSIS — D509 Iron deficiency anemia, unspecified: Secondary | ICD-10-CM | POA: Diagnosis not present

## 2018-12-03 DIAGNOSIS — N2581 Secondary hyperparathyroidism of renal origin: Secondary | ICD-10-CM | POA: Diagnosis not present

## 2018-12-03 DIAGNOSIS — D509 Iron deficiency anemia, unspecified: Secondary | ICD-10-CM | POA: Diagnosis not present

## 2018-12-03 DIAGNOSIS — N186 End stage renal disease: Secondary | ICD-10-CM | POA: Diagnosis not present

## 2018-12-04 ENCOUNTER — Encounter: Payer: Medicare Other | Admitting: Infectious Disease

## 2018-12-05 DIAGNOSIS — N186 End stage renal disease: Secondary | ICD-10-CM | POA: Diagnosis not present

## 2018-12-05 DIAGNOSIS — N2581 Secondary hyperparathyroidism of renal origin: Secondary | ICD-10-CM | POA: Diagnosis not present

## 2018-12-05 DIAGNOSIS — D509 Iron deficiency anemia, unspecified: Secondary | ICD-10-CM | POA: Diagnosis not present

## 2018-12-08 DIAGNOSIS — N2581 Secondary hyperparathyroidism of renal origin: Secondary | ICD-10-CM | POA: Diagnosis not present

## 2018-12-08 DIAGNOSIS — N186 End stage renal disease: Secondary | ICD-10-CM | POA: Diagnosis not present

## 2018-12-08 DIAGNOSIS — D509 Iron deficiency anemia, unspecified: Secondary | ICD-10-CM | POA: Diagnosis not present

## 2018-12-10 DIAGNOSIS — N2581 Secondary hyperparathyroidism of renal origin: Secondary | ICD-10-CM | POA: Diagnosis not present

## 2018-12-10 DIAGNOSIS — N186 End stage renal disease: Secondary | ICD-10-CM | POA: Diagnosis not present

## 2018-12-10 DIAGNOSIS — D509 Iron deficiency anemia, unspecified: Secondary | ICD-10-CM | POA: Diagnosis not present

## 2018-12-11 ENCOUNTER — Ambulatory Visit (INDEPENDENT_AMBULATORY_CARE_PROVIDER_SITE_OTHER): Payer: Medicare Other | Admitting: Infectious Disease

## 2018-12-11 VITALS — BP 118/80 | HR 63 | Temp 98.3°F | Wt 208.0 lb

## 2018-12-11 DIAGNOSIS — Z23 Encounter for immunization: Secondary | ICD-10-CM | POA: Diagnosis not present

## 2018-12-11 DIAGNOSIS — F321 Major depressive disorder, single episode, moderate: Secondary | ICD-10-CM

## 2018-12-11 DIAGNOSIS — B20 Human immunodeficiency virus [HIV] disease: Secondary | ICD-10-CM | POA: Diagnosis not present

## 2018-12-11 DIAGNOSIS — Z79899 Other long term (current) drug therapy: Secondary | ICD-10-CM

## 2018-12-11 DIAGNOSIS — Z992 Dependence on renal dialysis: Secondary | ICD-10-CM

## 2018-12-11 DIAGNOSIS — Z21 Asymptomatic human immunodeficiency virus [HIV] infection status: Secondary | ICD-10-CM

## 2018-12-11 DIAGNOSIS — I10 Essential (primary) hypertension: Secondary | ICD-10-CM

## 2018-12-11 DIAGNOSIS — N186 End stage renal disease: Secondary | ICD-10-CM | POA: Diagnosis not present
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
## 2018-12-11 NOTE — Addendum Note (Signed)
Addended by: Aundria Rud on: 12/11/2018 10:19 AM   Modules accepted: Orders

## 2018-12-12 DIAGNOSIS — N186 End stage renal disease: Secondary | ICD-10-CM | POA: Diagnosis not present

## 2018-12-12 DIAGNOSIS — D509 Iron deficiency anemia, unspecified: Secondary | ICD-10-CM | POA: Diagnosis not present

## 2018-12-12 DIAGNOSIS — N2581 Secondary hyperparathyroidism of renal origin: Secondary | ICD-10-CM | POA: Diagnosis not present

## 2018-12-13 DIAGNOSIS — N186 End stage renal disease: Secondary | ICD-10-CM | POA: Diagnosis not present

## 2018-12-13 DIAGNOSIS — I120 Hypertensive chronic kidney disease with stage 5 chronic kidney disease or end stage renal disease: Secondary | ICD-10-CM | POA: Diagnosis not present

## 2018-12-13 DIAGNOSIS — Z992 Dependence on renal dialysis: Secondary | ICD-10-CM | POA: Diagnosis not present

## 2018-12-15 DIAGNOSIS — D509 Iron deficiency anemia, unspecified: Secondary | ICD-10-CM | POA: Diagnosis not present

## 2018-12-15 DIAGNOSIS — N2581 Secondary hyperparathyroidism of renal origin: Secondary | ICD-10-CM | POA: Diagnosis not present

## 2018-12-15 DIAGNOSIS — N186 End stage renal disease: Secondary | ICD-10-CM | POA: Diagnosis not present

## 2018-12-17 DIAGNOSIS — D509 Iron deficiency anemia, unspecified: Secondary | ICD-10-CM | POA: Diagnosis not present

## 2018-12-17 DIAGNOSIS — N186 End stage renal disease: Secondary | ICD-10-CM | POA: Diagnosis not present

## 2018-12-17 DIAGNOSIS — N2581 Secondary hyperparathyroidism of renal origin: Secondary | ICD-10-CM | POA: Diagnosis not present

## 2018-12-19 DIAGNOSIS — D509 Iron deficiency anemia, unspecified: Secondary | ICD-10-CM | POA: Diagnosis not present

## 2018-12-19 DIAGNOSIS — N2581 Secondary hyperparathyroidism of renal origin: Secondary | ICD-10-CM | POA: Diagnosis not present

## 2018-12-19 DIAGNOSIS — N186 End stage renal disease: Secondary | ICD-10-CM | POA: Diagnosis not present

## 2018-12-22 DIAGNOSIS — D509 Iron deficiency anemia, unspecified: Secondary | ICD-10-CM | POA: Diagnosis not present

## 2018-12-22 DIAGNOSIS — N2581 Secondary hyperparathyroidism of renal origin: Secondary | ICD-10-CM | POA: Diagnosis not present

## 2018-12-22 DIAGNOSIS — N186 End stage renal disease: Secondary | ICD-10-CM | POA: Diagnosis not present

## 2018-12-24 DIAGNOSIS — N186 End stage renal disease: Secondary | ICD-10-CM | POA: Diagnosis not present

## 2018-12-24 DIAGNOSIS — N2581 Secondary hyperparathyroidism of renal origin: Secondary | ICD-10-CM | POA: Diagnosis not present

## 2018-12-24 DIAGNOSIS — D509 Iron deficiency anemia, unspecified: Secondary | ICD-10-CM | POA: Diagnosis not present

## 2018-12-26 DIAGNOSIS — D509 Iron deficiency anemia, unspecified: Secondary | ICD-10-CM | POA: Diagnosis not present

## 2018-12-26 DIAGNOSIS — N2581 Secondary hyperparathyroidism of renal origin: Secondary | ICD-10-CM | POA: Diagnosis not present

## 2018-12-26 DIAGNOSIS — N186 End stage renal disease: Secondary | ICD-10-CM | POA: Diagnosis not present

## 2018-12-29 DIAGNOSIS — N186 End stage renal disease: Secondary | ICD-10-CM | POA: Diagnosis not present

## 2018-12-29 DIAGNOSIS — N2581 Secondary hyperparathyroidism of renal origin: Secondary | ICD-10-CM | POA: Diagnosis not present

## 2018-12-29 DIAGNOSIS — D509 Iron deficiency anemia, unspecified: Secondary | ICD-10-CM | POA: Diagnosis not present

## 2018-12-31 DIAGNOSIS — D509 Iron deficiency anemia, unspecified: Secondary | ICD-10-CM | POA: Diagnosis not present

## 2018-12-31 DIAGNOSIS — N2581 Secondary hyperparathyroidism of renal origin: Secondary | ICD-10-CM | POA: Diagnosis not present

## 2018-12-31 DIAGNOSIS — N186 End stage renal disease: Secondary | ICD-10-CM | POA: Diagnosis not present

## 2019-01-01 MED FILL — BIKTARVY 50-200-25 MG TABS: 50-200-25 | 30 days supply | Qty: 30 | Fill #5

## 2019-01-02 DIAGNOSIS — N186 End stage renal disease: Secondary | ICD-10-CM | POA: Diagnosis not present

## 2019-01-02 DIAGNOSIS — D509 Iron deficiency anemia, unspecified: Secondary | ICD-10-CM | POA: Diagnosis not present

## 2019-01-02 DIAGNOSIS — N2581 Secondary hyperparathyroidism of renal origin: Secondary | ICD-10-CM | POA: Diagnosis not present

## 2019-01-05 DIAGNOSIS — N186 End stage renal disease: Secondary | ICD-10-CM | POA: Diagnosis not present

## 2019-01-05 DIAGNOSIS — D509 Iron deficiency anemia, unspecified: Secondary | ICD-10-CM | POA: Diagnosis not present

## 2019-01-05 DIAGNOSIS — N2581 Secondary hyperparathyroidism of renal origin: Secondary | ICD-10-CM | POA: Diagnosis not present

## 2019-01-07 DIAGNOSIS — N2581 Secondary hyperparathyroidism of renal origin: Secondary | ICD-10-CM | POA: Diagnosis not present

## 2019-01-07 DIAGNOSIS — N186 End stage renal disease: Secondary | ICD-10-CM | POA: Diagnosis not present

## 2019-01-07 DIAGNOSIS — D509 Iron deficiency anemia, unspecified: Secondary | ICD-10-CM | POA: Diagnosis not present

## 2019-01-09 DIAGNOSIS — N2581 Secondary hyperparathyroidism of renal origin: Secondary | ICD-10-CM | POA: Diagnosis not present

## 2019-01-09 DIAGNOSIS — D509 Iron deficiency anemia, unspecified: Secondary | ICD-10-CM | POA: Diagnosis not present

## 2019-01-09 DIAGNOSIS — N186 End stage renal disease: Secondary | ICD-10-CM | POA: Diagnosis not present

## 2019-01-11 DIAGNOSIS — N186 End stage renal disease: Secondary | ICD-10-CM | POA: Diagnosis not present

## 2019-01-11 DIAGNOSIS — Z992 Dependence on renal dialysis: Secondary | ICD-10-CM | POA: Diagnosis not present

## 2019-01-11 DIAGNOSIS — I120 Hypertensive chronic kidney disease with stage 5 chronic kidney disease or end stage renal disease: Secondary | ICD-10-CM | POA: Diagnosis not present

## 2019-01-12 DIAGNOSIS — D509 Iron deficiency anemia, unspecified: Secondary | ICD-10-CM | POA: Diagnosis not present

## 2019-01-12 DIAGNOSIS — N186 End stage renal disease: Secondary | ICD-10-CM | POA: Diagnosis not present

## 2019-01-12 DIAGNOSIS — N2581 Secondary hyperparathyroidism of renal origin: Secondary | ICD-10-CM | POA: Diagnosis not present

## 2019-01-14 DIAGNOSIS — N2581 Secondary hyperparathyroidism of renal origin: Secondary | ICD-10-CM | POA: Diagnosis not present

## 2019-01-14 DIAGNOSIS — D509 Iron deficiency anemia, unspecified: Secondary | ICD-10-CM | POA: Diagnosis not present

## 2019-01-14 DIAGNOSIS — N186 End stage renal disease: Secondary | ICD-10-CM | POA: Diagnosis not present

## 2019-01-16 DIAGNOSIS — D509 Iron deficiency anemia, unspecified: Secondary | ICD-10-CM | POA: Diagnosis not present

## 2019-01-16 DIAGNOSIS — N186 End stage renal disease: Secondary | ICD-10-CM | POA: Diagnosis not present

## 2019-01-16 DIAGNOSIS — N2581 Secondary hyperparathyroidism of renal origin: Secondary | ICD-10-CM | POA: Diagnosis not present

## 2019-01-19 DIAGNOSIS — N2581 Secondary hyperparathyroidism of renal origin: Secondary | ICD-10-CM | POA: Diagnosis not present

## 2019-01-19 DIAGNOSIS — D509 Iron deficiency anemia, unspecified: Secondary | ICD-10-CM | POA: Diagnosis not present

## 2019-01-19 DIAGNOSIS — N186 End stage renal disease: Secondary | ICD-10-CM | POA: Diagnosis not present

## 2019-01-21 DIAGNOSIS — N2581 Secondary hyperparathyroidism of renal origin: Secondary | ICD-10-CM | POA: Diagnosis not present

## 2019-01-21 DIAGNOSIS — N186 End stage renal disease: Secondary | ICD-10-CM | POA: Diagnosis not present

## 2019-01-21 DIAGNOSIS — D509 Iron deficiency anemia, unspecified: Secondary | ICD-10-CM | POA: Diagnosis not present

## 2019-01-23 DIAGNOSIS — D509 Iron deficiency anemia, unspecified: Secondary | ICD-10-CM | POA: Diagnosis not present

## 2019-01-23 DIAGNOSIS — N2581 Secondary hyperparathyroidism of renal origin: Secondary | ICD-10-CM | POA: Diagnosis not present

## 2019-01-23 DIAGNOSIS — N186 End stage renal disease: Secondary | ICD-10-CM | POA: Diagnosis not present

## 2019-01-26 DIAGNOSIS — D509 Iron deficiency anemia, unspecified: Secondary | ICD-10-CM | POA: Diagnosis not present

## 2019-01-26 DIAGNOSIS — N2581 Secondary hyperparathyroidism of renal origin: Secondary | ICD-10-CM | POA: Diagnosis not present

## 2019-01-26 DIAGNOSIS — N186 End stage renal disease: Secondary | ICD-10-CM | POA: Diagnosis not present

## 2019-01-28 DIAGNOSIS — D509 Iron deficiency anemia, unspecified: Secondary | ICD-10-CM | POA: Diagnosis not present

## 2019-01-28 DIAGNOSIS — N2581 Secondary hyperparathyroidism of renal origin: Secondary | ICD-10-CM | POA: Diagnosis not present

## 2019-01-28 DIAGNOSIS — N186 End stage renal disease: Secondary | ICD-10-CM | POA: Diagnosis not present

## 2019-01-30 DIAGNOSIS — D509 Iron deficiency anemia, unspecified: Secondary | ICD-10-CM | POA: Diagnosis not present

## 2019-01-30 DIAGNOSIS — N2581 Secondary hyperparathyroidism of renal origin: Secondary | ICD-10-CM | POA: Diagnosis not present

## 2019-01-30 DIAGNOSIS — N186 End stage renal disease: Secondary | ICD-10-CM | POA: Diagnosis not present

## 2019-01-30 MED FILL — BIKTARVY 50-200-25 MG TABS: 50-200-25 | 30 days supply | Qty: 30 | Fill #0

## 2019-02-02 DIAGNOSIS — N186 End stage renal disease: Secondary | ICD-10-CM | POA: Diagnosis not present

## 2019-02-02 DIAGNOSIS — D509 Iron deficiency anemia, unspecified: Secondary | ICD-10-CM | POA: Diagnosis not present

## 2019-02-02 DIAGNOSIS — N2581 Secondary hyperparathyroidism of renal origin: Secondary | ICD-10-CM | POA: Diagnosis not present

## 2019-02-04 DIAGNOSIS — N2581 Secondary hyperparathyroidism of renal origin: Secondary | ICD-10-CM | POA: Diagnosis not present

## 2019-02-04 DIAGNOSIS — N186 End stage renal disease: Secondary | ICD-10-CM | POA: Diagnosis not present

## 2019-02-04 DIAGNOSIS — D509 Iron deficiency anemia, unspecified: Secondary | ICD-10-CM | POA: Diagnosis not present

## 2019-02-06 DIAGNOSIS — N2581 Secondary hyperparathyroidism of renal origin: Secondary | ICD-10-CM | POA: Diagnosis not present

## 2019-02-06 DIAGNOSIS — D509 Iron deficiency anemia, unspecified: Secondary | ICD-10-CM | POA: Diagnosis not present

## 2019-02-06 DIAGNOSIS — N186 End stage renal disease: Secondary | ICD-10-CM | POA: Diagnosis not present

## 2019-02-08 ENCOUNTER — Telehealth: Payer: Medicare Other | Admitting: Nurse Practitioner

## 2019-02-08 DIAGNOSIS — J00 Acute nasopharyngitis [common cold]: Secondary | ICD-10-CM

## 2019-02-08 MED ORDER — FLUTICASONE PROPIONATE 50 MCG/ACT NA SUSP: 2.0000 | Freq: Every day | NASAL | 6 refills | Status: DC

## 2019-02-08 MED ORDER — BENZONATATE 100 MG PO CAPS: 100.0000 mg | ORAL_CAPSULE | Freq: Three times a day (TID) | ORAL | 0 refills | Status: DC | PRN

## 2019-02-08 NOTE — Progress Notes (Signed)
We are sorry you are not feeling well.  Here is how we plan to help!  Based on what you have shared with me, it looks like you may have a viral upper respiratory infection.  Upper respiratory infections are caused by a large number of viruses; however, rhinovirus is the most common cause.   Symptoms vary from person to person, with common symptoms including sore throat, cough, and fatigue or lack of energy.  A low-grade fever of up to 100.4 may present, but is often uncommon.  Symptoms vary however, and are closely related to a person's age or underlying illnesses.  The most common symptoms associated with an upper respiratory infection are nasal discharge or congestion, cough, sneezing, headache and pressure in the ears and face.  These symptoms usually persist for about 3 to 10 days, but can last up to 2 weeks.  It is important to know that upper respiratory infections do not cause serious illness or complications in most cases.    Upper respiratory infections can be transmitted from person to person, with the most common method of transmission being a person's hands.  The virus is able to live on the skin and can infect other persons for up to 2 hours after direct contact.  Also, these can be transmitted when someone coughs or sneezes; thus, it is important to cover the mouth to reduce this risk.  To keep the spread of the illness at [place], good hand hygiene is very important.  This is an infection that is most likely caused by a virus. There are no specific treatments other than to help you with the symptoms until the infection runs its course.  We are sorry you are not feeling well.  Here is how we plan to help!   For nasal congestion, you may use an oral decongestants such as Mucinex D or if you have glaucoma or high blood pressure use plain Mucinex.  Saline nasal spray or nasal drops can help and can safely be used as often as needed for congestion.  For your congestion, I have prescribed Fluticasone  nasal spray one spray in each nostril twice a day  If you do not have a history of heart disease, hypertension, diabetes or thyroid disease, prostate/bladder issues or glaucoma, you may also use Sudafed to treat nasal congestion.  It is highly recommended that you consult with a pharmacist or your primary care physician to ensure this medication is safe for you to take.     If you have a cough, you may use cough suppressants such as Delsym and Robitussin.  If you have glaucoma or high blood pressure, you can also use Coricidin HBP.   For cough I have prescribed for you A prescription cough medication called Tessalon Perles 100 mg. You may take 1-2 capsules every 8 hours as needed for cough  If you have a sore or scratchy throat, use a saltwater gargle-  to  teaspoon of salt dissolved in a 4-ounce to 8-ounce glass of warm water.  Gargle the solution for approximately 15-30 seconds and then spit.  It is important not to swallow the solution.  You can also use throat lozenges/cough drops and Chloraseptic spray to help with throat pain or discomfort.  Warm or cold liquids can also be helpful in relieving throat pain.  For headache, pain or general discomfort, you can use Ibuprofen or Tylenol as directed.   Some authorities believe that zinc sprays or the use of Echinacea may shorten the  course of your symptoms.   HOME CARE . Only take medications as instructed by your medical team. . Be sure to drink plenty of fluids. Water is fine as well as fruit juices, sodas and electrolyte beverages. You may want to stay away from caffeine or alcohol. If you are nauseated, try taking small sips of liquids. How do you know if you are getting enough fluid? Your urine should be a pale yellow or almost colorless. . Get rest. . Taking a steamy shower or using a humidifier may help nasal congestion and ease sore throat pain. You can place a towel over your head and breathe in the steam from hot water coming from a  faucet. . Using a saline nasal spray works much the same way. . Cough drops, hard candies and sore throat lozenges may ease your cough. . Avoid close contacts especially the very young and the elderly . Cover your mouth if you cough or sneeze . Always remember to wash your hands.   GET HELP RIGHT AWAY IF: . You develop worsening fever. . If your symptoms do not improve within 10 days . You develop yellow or green discharge from your nose over 3 days. . You have coughing fits . You develop a severe head ache or visual changes. . You develop shortness of breath, difficulty breathing or start having chest pain . Your symptoms persist after you have completed your treatment plan  MAKE SURE YOU   Understand these instructions.  Will watch your condition.  Will get help right away if you are not doing well or get worse.  Your e-visit answers were reviewed by a board certified advanced clinical practitioner to complete your personal care plan. Depending upon the condition, your plan could have included both over the counter or prescription medications. Please review your pharmacy choice. If there is a problem, you may call our nursing hot line at and have the prescription routed to another pharmacy. Your safety is important to [place]. If you have drug allergies check your prescription carefully.   You can use MyChart to ask questions about today's visit, request a non-urgent call back, or ask for a work or school excuse for 24 hours related to this e-Visit. If it has been greater than 24 hours you will need to follow up with your provider, or enter a new e-Visit to address those concerns. You will get an e-mail in the next two days asking about your experience.  I hope that your e-visit has been valuable and will speed your recovery. Thank you for using e-visits.   5 minutes spent reviewing and documenting in chart.

## 2019-02-09 DIAGNOSIS — D509 Iron deficiency anemia, unspecified: Secondary | ICD-10-CM | POA: Diagnosis not present

## 2019-02-09 DIAGNOSIS — N186 End stage renal disease: Secondary | ICD-10-CM | POA: Diagnosis not present

## 2019-02-09 DIAGNOSIS — N2581 Secondary hyperparathyroidism of renal origin: Secondary | ICD-10-CM | POA: Diagnosis not present

## 2019-02-11 DIAGNOSIS — I120 Hypertensive chronic kidney disease with stage 5 chronic kidney disease or end stage renal disease: Secondary | ICD-10-CM | POA: Diagnosis not present

## 2019-02-11 DIAGNOSIS — D509 Iron deficiency anemia, unspecified: Secondary | ICD-10-CM | POA: Diagnosis not present

## 2019-02-11 DIAGNOSIS — N2581 Secondary hyperparathyroidism of renal origin: Secondary | ICD-10-CM | POA: Diagnosis not present

## 2019-02-11 DIAGNOSIS — Z992 Dependence on renal dialysis: Secondary | ICD-10-CM | POA: Diagnosis not present

## 2019-02-11 DIAGNOSIS — N186 End stage renal disease: Secondary | ICD-10-CM | POA: Diagnosis not present

## 2019-02-13 DIAGNOSIS — N2581 Secondary hyperparathyroidism of renal origin: Secondary | ICD-10-CM | POA: Diagnosis not present

## 2019-02-13 DIAGNOSIS — D509 Iron deficiency anemia, unspecified: Secondary | ICD-10-CM | POA: Diagnosis not present

## 2019-02-13 DIAGNOSIS — N186 End stage renal disease: Secondary | ICD-10-CM | POA: Diagnosis not present

## 2019-02-16 DIAGNOSIS — D509 Iron deficiency anemia, unspecified: Secondary | ICD-10-CM | POA: Diagnosis not present

## 2019-02-16 DIAGNOSIS — N186 End stage renal disease: Secondary | ICD-10-CM | POA: Diagnosis not present

## 2019-02-16 DIAGNOSIS — N2581 Secondary hyperparathyroidism of renal origin: Secondary | ICD-10-CM | POA: Diagnosis not present

## 2019-02-18 DIAGNOSIS — N2581 Secondary hyperparathyroidism of renal origin: Secondary | ICD-10-CM | POA: Diagnosis not present

## 2019-02-18 DIAGNOSIS — D509 Iron deficiency anemia, unspecified: Secondary | ICD-10-CM | POA: Diagnosis not present

## 2019-02-18 DIAGNOSIS — N186 End stage renal disease: Secondary | ICD-10-CM | POA: Diagnosis not present

## 2019-02-20 DIAGNOSIS — N186 End stage renal disease: Secondary | ICD-10-CM | POA: Diagnosis not present

## 2019-02-20 DIAGNOSIS — N2581 Secondary hyperparathyroidism of renal origin: Secondary | ICD-10-CM | POA: Diagnosis not present

## 2019-02-20 DIAGNOSIS — D509 Iron deficiency anemia, unspecified: Secondary | ICD-10-CM | POA: Diagnosis not present

## 2019-02-23 DIAGNOSIS — D509 Iron deficiency anemia, unspecified: Secondary | ICD-10-CM | POA: Diagnosis not present

## 2019-02-23 DIAGNOSIS — N186 End stage renal disease: Secondary | ICD-10-CM | POA: Diagnosis not present

## 2019-02-23 DIAGNOSIS — N2581 Secondary hyperparathyroidism of renal origin: Secondary | ICD-10-CM | POA: Diagnosis not present

## 2019-02-25 DIAGNOSIS — N2581 Secondary hyperparathyroidism of renal origin: Secondary | ICD-10-CM | POA: Diagnosis not present

## 2019-02-25 DIAGNOSIS — D509 Iron deficiency anemia, unspecified: Secondary | ICD-10-CM | POA: Diagnosis not present

## 2019-02-25 DIAGNOSIS — N186 End stage renal disease: Secondary | ICD-10-CM | POA: Diagnosis not present

## 2019-02-26 ENCOUNTER — Other Ambulatory Visit: Payer: Self-pay | Admitting: Family

## 2019-02-26 MED FILL — BIKTARVY 50-200-25 MG TABS: 50-200-25 | 30 days supply | Qty: 30 | Fill #0

## 2019-02-27 DIAGNOSIS — N2581 Secondary hyperparathyroidism of renal origin: Secondary | ICD-10-CM | POA: Diagnosis not present

## 2019-02-27 DIAGNOSIS — D509 Iron deficiency anemia, unspecified: Secondary | ICD-10-CM | POA: Diagnosis not present

## 2019-02-27 DIAGNOSIS — N186 End stage renal disease: Secondary | ICD-10-CM | POA: Diagnosis not present

## 2019-03-02 DIAGNOSIS — D509 Iron deficiency anemia, unspecified: Secondary | ICD-10-CM | POA: Diagnosis not present

## 2019-03-02 DIAGNOSIS — N186 End stage renal disease: Secondary | ICD-10-CM | POA: Diagnosis not present

## 2019-03-02 DIAGNOSIS — N2581 Secondary hyperparathyroidism of renal origin: Secondary | ICD-10-CM | POA: Diagnosis not present

## 2019-03-04 DIAGNOSIS — N186 End stage renal disease: Secondary | ICD-10-CM | POA: Diagnosis not present

## 2019-03-04 DIAGNOSIS — N2581 Secondary hyperparathyroidism of renal origin: Secondary | ICD-10-CM | POA: Diagnosis not present

## 2019-03-04 DIAGNOSIS — D509 Iron deficiency anemia, unspecified: Secondary | ICD-10-CM | POA: Diagnosis not present

## 2019-03-06 DIAGNOSIS — D509 Iron deficiency anemia, unspecified: Secondary | ICD-10-CM | POA: Diagnosis not present

## 2019-03-06 DIAGNOSIS — N186 End stage renal disease: Secondary | ICD-10-CM | POA: Diagnosis not present

## 2019-03-06 DIAGNOSIS — N2581 Secondary hyperparathyroidism of renal origin: Secondary | ICD-10-CM | POA: Diagnosis not present

## 2019-03-09 DIAGNOSIS — D509 Iron deficiency anemia, unspecified: Secondary | ICD-10-CM | POA: Diagnosis not present

## 2019-03-09 DIAGNOSIS — N186 End stage renal disease: Secondary | ICD-10-CM | POA: Diagnosis not present

## 2019-03-09 DIAGNOSIS — N2581 Secondary hyperparathyroidism of renal origin: Secondary | ICD-10-CM | POA: Diagnosis not present

## 2019-03-11 DIAGNOSIS — N186 End stage renal disease: Secondary | ICD-10-CM | POA: Diagnosis not present

## 2019-03-11 DIAGNOSIS — N2581 Secondary hyperparathyroidism of renal origin: Secondary | ICD-10-CM | POA: Diagnosis not present

## 2019-03-11 DIAGNOSIS — D509 Iron deficiency anemia, unspecified: Secondary | ICD-10-CM | POA: Diagnosis not present

## 2019-03-13 ENCOUNTER — Telehealth: Payer: Self-pay | Admitting: Pharmacy Technician

## 2019-03-13 DIAGNOSIS — N186 End stage renal disease: Secondary | ICD-10-CM | POA: Diagnosis not present

## 2019-03-13 DIAGNOSIS — I120 Hypertensive chronic kidney disease with stage 5 chronic kidney disease or end stage renal disease: Secondary | ICD-10-CM | POA: Diagnosis not present

## 2019-03-13 DIAGNOSIS — N2581 Secondary hyperparathyroidism of renal origin: Secondary | ICD-10-CM | POA: Diagnosis not present

## 2019-03-13 DIAGNOSIS — D509 Iron deficiency anemia, unspecified: Secondary | ICD-10-CM | POA: Diagnosis not present

## 2019-03-13 DIAGNOSIS — Z992 Dependence on renal dialysis: Secondary | ICD-10-CM | POA: Diagnosis not present

## 2019-03-13 NOTE — Telephone Encounter (Signed)
[facility] Patient Advocate Encounter   I was successful in securing patient a $7200 grant from [employer] ([employer]) to provide copayment coverage for Biktarvy.  This will make the out of pocket cost $0.     I have spoken with the patient.    The billing information is as follows and has been shared with [place].   RxBin: [id number] PCN:  Member ID: [id number] Group ID: [id number] Dates of Eligibility: 03/13/2019 through 12/14/2019   [facility] Phone: [phone] Fax:  [phone]

## 2019-03-16 DIAGNOSIS — N186 End stage renal disease: Secondary | ICD-10-CM | POA: Diagnosis not present

## 2019-03-16 DIAGNOSIS — N2581 Secondary hyperparathyroidism of renal origin: Secondary | ICD-10-CM | POA: Diagnosis not present

## 2019-03-16 DIAGNOSIS — D509 Iron deficiency anemia, unspecified: Secondary | ICD-10-CM | POA: Diagnosis not present

## 2019-03-18 DIAGNOSIS — D509 Iron deficiency anemia, unspecified: Secondary | ICD-10-CM | POA: Diagnosis not present

## 2019-03-18 DIAGNOSIS — N2581 Secondary hyperparathyroidism of renal origin: Secondary | ICD-10-CM | POA: Diagnosis not present

## 2019-03-18 DIAGNOSIS — N186 End stage renal disease: Secondary | ICD-10-CM | POA: Diagnosis not present

## 2019-03-20 DIAGNOSIS — N2581 Secondary hyperparathyroidism of renal origin: Secondary | ICD-10-CM | POA: Diagnosis not present

## 2019-03-20 DIAGNOSIS — N186 End stage renal disease: Secondary | ICD-10-CM | POA: Diagnosis not present

## 2019-03-20 DIAGNOSIS — D509 Iron deficiency anemia, unspecified: Secondary | ICD-10-CM | POA: Diagnosis not present

## 2019-03-23 DIAGNOSIS — N186 End stage renal disease: Secondary | ICD-10-CM | POA: Diagnosis not present

## 2019-03-23 DIAGNOSIS — D509 Iron deficiency anemia, unspecified: Secondary | ICD-10-CM | POA: Diagnosis not present

## 2019-03-23 DIAGNOSIS — N2581 Secondary hyperparathyroidism of renal origin: Secondary | ICD-10-CM | POA: Diagnosis not present

## 2019-03-25 DIAGNOSIS — N186 End stage renal disease: Secondary | ICD-10-CM | POA: Diagnosis not present

## 2019-03-25 DIAGNOSIS — D509 Iron deficiency anemia, unspecified: Secondary | ICD-10-CM | POA: Diagnosis not present

## 2019-03-25 DIAGNOSIS — N2581 Secondary hyperparathyroidism of renal origin: Secondary | ICD-10-CM | POA: Diagnosis not present

## 2019-03-27 DIAGNOSIS — N2581 Secondary hyperparathyroidism of renal origin: Secondary | ICD-10-CM | POA: Diagnosis not present

## 2019-03-27 DIAGNOSIS — N186 End stage renal disease: Secondary | ICD-10-CM | POA: Diagnosis not present

## 2019-03-27 DIAGNOSIS — D509 Iron deficiency anemia, unspecified: Secondary | ICD-10-CM | POA: Diagnosis not present

## 2019-03-30 DIAGNOSIS — N2581 Secondary hyperparathyroidism of renal origin: Secondary | ICD-10-CM | POA: Diagnosis not present

## 2019-03-30 DIAGNOSIS — N186 End stage renal disease: Secondary | ICD-10-CM | POA: Diagnosis not present

## 2019-03-30 DIAGNOSIS — D509 Iron deficiency anemia, unspecified: Secondary | ICD-10-CM | POA: Diagnosis not present

## 2019-04-01 DIAGNOSIS — N186 End stage renal disease: Secondary | ICD-10-CM | POA: Diagnosis not present

## 2019-04-01 DIAGNOSIS — D509 Iron deficiency anemia, unspecified: Secondary | ICD-10-CM | POA: Diagnosis not present

## 2019-04-01 DIAGNOSIS — N2581 Secondary hyperparathyroidism of renal origin: Secondary | ICD-10-CM | POA: Diagnosis not present

## 2019-04-03 DIAGNOSIS — D509 Iron deficiency anemia, unspecified: Secondary | ICD-10-CM | POA: Diagnosis not present

## 2019-04-03 DIAGNOSIS — N2581 Secondary hyperparathyroidism of renal origin: Secondary | ICD-10-CM | POA: Diagnosis not present

## 2019-04-03 DIAGNOSIS — N186 End stage renal disease: Secondary | ICD-10-CM | POA: Diagnosis not present

## 2019-04-06 DIAGNOSIS — D509 Iron deficiency anemia, unspecified: Secondary | ICD-10-CM | POA: Diagnosis not present

## 2019-04-06 DIAGNOSIS — N186 End stage renal disease: Secondary | ICD-10-CM | POA: Diagnosis not present

## 2019-04-06 DIAGNOSIS — N2581 Secondary hyperparathyroidism of renal origin: Secondary | ICD-10-CM | POA: Diagnosis not present

## 2019-04-08 DIAGNOSIS — N2581 Secondary hyperparathyroidism of renal origin: Secondary | ICD-10-CM | POA: Diagnosis not present

## 2019-04-08 DIAGNOSIS — D509 Iron deficiency anemia, unspecified: Secondary | ICD-10-CM | POA: Diagnosis not present

## 2019-04-08 DIAGNOSIS — N186 End stage renal disease: Secondary | ICD-10-CM | POA: Diagnosis not present

## 2019-04-09 MED FILL — BIKTARVY 50-200-25 MG TABS: 50-200-25 | 30 days supply | Qty: 30 | Fill #1

## 2019-04-10 DIAGNOSIS — N2581 Secondary hyperparathyroidism of renal origin: Secondary | ICD-10-CM | POA: Diagnosis not present

## 2019-04-10 DIAGNOSIS — D509 Iron deficiency anemia, unspecified: Secondary | ICD-10-CM | POA: Diagnosis not present

## 2019-04-10 DIAGNOSIS — N186 End stage renal disease: Secondary | ICD-10-CM | POA: Diagnosis not present

## 2019-04-13 DIAGNOSIS — N186 End stage renal disease: Secondary | ICD-10-CM | POA: Diagnosis not present

## 2019-04-13 DIAGNOSIS — Z992 Dependence on renal dialysis: Secondary | ICD-10-CM | POA: Diagnosis not present

## 2019-04-13 DIAGNOSIS — I120 Hypertensive chronic kidney disease with stage 5 chronic kidney disease or end stage renal disease: Secondary | ICD-10-CM | POA: Diagnosis not present

## 2019-04-13 DIAGNOSIS — D509 Iron deficiency anemia, unspecified: Secondary | ICD-10-CM | POA: Diagnosis not present

## 2019-04-13 DIAGNOSIS — N2581 Secondary hyperparathyroidism of renal origin: Secondary | ICD-10-CM | POA: Diagnosis not present

## 2019-04-15 DIAGNOSIS — D509 Iron deficiency anemia, unspecified: Secondary | ICD-10-CM | POA: Diagnosis not present

## 2019-04-15 DIAGNOSIS — N186 End stage renal disease: Secondary | ICD-10-CM | POA: Diagnosis not present

## 2019-04-15 DIAGNOSIS — N2581 Secondary hyperparathyroidism of renal origin: Secondary | ICD-10-CM | POA: Diagnosis not present

## 2019-04-17 DIAGNOSIS — D509 Iron deficiency anemia, unspecified: Secondary | ICD-10-CM | POA: Diagnosis not present

## 2019-04-17 DIAGNOSIS — N186 End stage renal disease: Secondary | ICD-10-CM | POA: Diagnosis not present

## 2019-04-17 DIAGNOSIS — N2581 Secondary hyperparathyroidism of renal origin: Secondary | ICD-10-CM | POA: Diagnosis not present

## 2019-04-20 DIAGNOSIS — N2581 Secondary hyperparathyroidism of renal origin: Secondary | ICD-10-CM | POA: Diagnosis not present

## 2019-04-20 DIAGNOSIS — D509 Iron deficiency anemia, unspecified: Secondary | ICD-10-CM | POA: Diagnosis not present

## 2019-04-20 DIAGNOSIS — N186 End stage renal disease: Secondary | ICD-10-CM | POA: Diagnosis not present

## 2019-04-22 DIAGNOSIS — N186 End stage renal disease: Secondary | ICD-10-CM | POA: Diagnosis not present

## 2019-04-22 DIAGNOSIS — D509 Iron deficiency anemia, unspecified: Secondary | ICD-10-CM | POA: Diagnosis not present

## 2019-04-22 DIAGNOSIS — N2581 Secondary hyperparathyroidism of renal origin: Secondary | ICD-10-CM | POA: Diagnosis not present

## 2019-04-24 DIAGNOSIS — D509 Iron deficiency anemia, unspecified: Secondary | ICD-10-CM | POA: Diagnosis not present

## 2019-04-24 DIAGNOSIS — N186 End stage renal disease: Secondary | ICD-10-CM | POA: Diagnosis not present

## 2019-04-24 DIAGNOSIS — N2581 Secondary hyperparathyroidism of renal origin: Secondary | ICD-10-CM | POA: Diagnosis not present

## 2019-04-27 DIAGNOSIS — N2581 Secondary hyperparathyroidism of renal origin: Secondary | ICD-10-CM | POA: Diagnosis not present

## 2019-04-27 DIAGNOSIS — N186 End stage renal disease: Secondary | ICD-10-CM | POA: Diagnosis not present

## 2019-04-27 DIAGNOSIS — D509 Iron deficiency anemia, unspecified: Secondary | ICD-10-CM | POA: Diagnosis not present

## 2019-04-29 DIAGNOSIS — N186 End stage renal disease: Secondary | ICD-10-CM | POA: Diagnosis not present

## 2019-04-29 DIAGNOSIS — N2581 Secondary hyperparathyroidism of renal origin: Secondary | ICD-10-CM | POA: Diagnosis not present

## 2019-04-29 DIAGNOSIS — D509 Iron deficiency anemia, unspecified: Secondary | ICD-10-CM | POA: Diagnosis not present

## 2019-05-01 DIAGNOSIS — N2581 Secondary hyperparathyroidism of renal origin: Secondary | ICD-10-CM | POA: Diagnosis not present

## 2019-05-01 DIAGNOSIS — D509 Iron deficiency anemia, unspecified: Secondary | ICD-10-CM | POA: Diagnosis not present

## 2019-05-01 DIAGNOSIS — N186 End stage renal disease: Secondary | ICD-10-CM | POA: Diagnosis not present

## 2019-05-04 DIAGNOSIS — N186 End stage renal disease: Secondary | ICD-10-CM | POA: Diagnosis not present

## 2019-05-04 DIAGNOSIS — N2581 Secondary hyperparathyroidism of renal origin: Secondary | ICD-10-CM | POA: Diagnosis not present

## 2019-05-04 DIAGNOSIS — D509 Iron deficiency anemia, unspecified: Secondary | ICD-10-CM | POA: Diagnosis not present

## 2019-05-06 DIAGNOSIS — N186 End stage renal disease: Secondary | ICD-10-CM | POA: Diagnosis not present

## 2019-05-06 DIAGNOSIS — D509 Iron deficiency anemia, unspecified: Secondary | ICD-10-CM | POA: Diagnosis not present

## 2019-05-06 DIAGNOSIS — N2581 Secondary hyperparathyroidism of renal origin: Secondary | ICD-10-CM | POA: Diagnosis not present

## 2019-05-06 MED FILL — BIKTARVY 50-200-25 MG TABS: 50-200-25 | 30 days supply | Qty: 30 | Fill #2

## 2019-05-08 DIAGNOSIS — D509 Iron deficiency anemia, unspecified: Secondary | ICD-10-CM | POA: Diagnosis not present

## 2019-05-08 DIAGNOSIS — N2581 Secondary hyperparathyroidism of renal origin: Secondary | ICD-10-CM | POA: Diagnosis not present

## 2019-05-08 DIAGNOSIS — N186 End stage renal disease: Secondary | ICD-10-CM | POA: Diagnosis not present

## 2019-05-11 DIAGNOSIS — N2581 Secondary hyperparathyroidism of renal origin: Secondary | ICD-10-CM | POA: Diagnosis not present

## 2019-05-11 DIAGNOSIS — D509 Iron deficiency anemia, unspecified: Secondary | ICD-10-CM | POA: Diagnosis not present

## 2019-05-11 DIAGNOSIS — N186 End stage renal disease: Secondary | ICD-10-CM | POA: Diagnosis not present

## 2019-05-13 DIAGNOSIS — N186 End stage renal disease: Secondary | ICD-10-CM | POA: Diagnosis not present

## 2019-05-13 DIAGNOSIS — N2581 Secondary hyperparathyroidism of renal origin: Secondary | ICD-10-CM | POA: Diagnosis not present

## 2019-05-13 DIAGNOSIS — Z992 Dependence on renal dialysis: Secondary | ICD-10-CM | POA: Diagnosis not present

## 2019-05-13 DIAGNOSIS — I120 Hypertensive chronic kidney disease with stage 5 chronic kidney disease or end stage renal disease: Secondary | ICD-10-CM | POA: Diagnosis not present

## 2019-05-13 DIAGNOSIS — D509 Iron deficiency anemia, unspecified: Secondary | ICD-10-CM | POA: Diagnosis not present

## 2019-05-15 DIAGNOSIS — N186 End stage renal disease: Secondary | ICD-10-CM | POA: Diagnosis not present

## 2019-05-15 DIAGNOSIS — D509 Iron deficiency anemia, unspecified: Secondary | ICD-10-CM | POA: Diagnosis not present

## 2019-05-15 DIAGNOSIS — N2581 Secondary hyperparathyroidism of renal origin: Secondary | ICD-10-CM | POA: Diagnosis not present

## 2019-05-18 DIAGNOSIS — N2581 Secondary hyperparathyroidism of renal origin: Secondary | ICD-10-CM | POA: Diagnosis not present

## 2019-05-18 DIAGNOSIS — N186 End stage renal disease: Secondary | ICD-10-CM | POA: Diagnosis not present

## 2019-05-18 DIAGNOSIS — D509 Iron deficiency anemia, unspecified: Secondary | ICD-10-CM | POA: Diagnosis not present

## 2019-05-20 DIAGNOSIS — N2581 Secondary hyperparathyroidism of renal origin: Secondary | ICD-10-CM | POA: Diagnosis not present

## 2019-05-20 DIAGNOSIS — D509 Iron deficiency anemia, unspecified: Secondary | ICD-10-CM | POA: Diagnosis not present

## 2019-05-20 DIAGNOSIS — N186 End stage renal disease: Secondary | ICD-10-CM | POA: Diagnosis not present

## 2019-05-22 DIAGNOSIS — D509 Iron deficiency anemia, unspecified: Secondary | ICD-10-CM | POA: Diagnosis not present

## 2019-05-22 DIAGNOSIS — N2581 Secondary hyperparathyroidism of renal origin: Secondary | ICD-10-CM | POA: Diagnosis not present

## 2019-05-22 DIAGNOSIS — N186 End stage renal disease: Secondary | ICD-10-CM | POA: Diagnosis not present

## 2019-05-25 DIAGNOSIS — D509 Iron deficiency anemia, unspecified: Secondary | ICD-10-CM | POA: Diagnosis not present

## 2019-05-25 DIAGNOSIS — N186 End stage renal disease: Secondary | ICD-10-CM | POA: Diagnosis not present

## 2019-05-25 DIAGNOSIS — N2581 Secondary hyperparathyroidism of renal origin: Secondary | ICD-10-CM | POA: Diagnosis not present

## 2019-05-27 DIAGNOSIS — D509 Iron deficiency anemia, unspecified: Secondary | ICD-10-CM | POA: Diagnosis not present

## 2019-05-27 DIAGNOSIS — N2581 Secondary hyperparathyroidism of renal origin: Secondary | ICD-10-CM | POA: Diagnosis not present

## 2019-05-27 DIAGNOSIS — N186 End stage renal disease: Secondary | ICD-10-CM | POA: Diagnosis not present

## 2019-05-29 DIAGNOSIS — D509 Iron deficiency anemia, unspecified: Secondary | ICD-10-CM | POA: Diagnosis not present

## 2019-05-29 DIAGNOSIS — N2581 Secondary hyperparathyroidism of renal origin: Secondary | ICD-10-CM | POA: Diagnosis not present

## 2019-05-29 DIAGNOSIS — N186 End stage renal disease: Secondary | ICD-10-CM | POA: Diagnosis not present

## 2019-06-01 DIAGNOSIS — D509 Iron deficiency anemia, unspecified: Secondary | ICD-10-CM | POA: Diagnosis not present

## 2019-06-01 DIAGNOSIS — N186 End stage renal disease: Secondary | ICD-10-CM | POA: Diagnosis not present

## 2019-06-01 DIAGNOSIS — N2581 Secondary hyperparathyroidism of renal origin: Secondary | ICD-10-CM | POA: Diagnosis not present

## 2019-06-03 DIAGNOSIS — D509 Iron deficiency anemia, unspecified: Secondary | ICD-10-CM | POA: Diagnosis not present

## 2019-06-03 DIAGNOSIS — N186 End stage renal disease: Secondary | ICD-10-CM | POA: Diagnosis not present

## 2019-06-03 DIAGNOSIS — N2581 Secondary hyperparathyroidism of renal origin: Secondary | ICD-10-CM | POA: Diagnosis not present

## 2019-06-05 DIAGNOSIS — N186 End stage renal disease: Secondary | ICD-10-CM | POA: Diagnosis not present

## 2019-06-05 DIAGNOSIS — N2581 Secondary hyperparathyroidism of renal origin: Secondary | ICD-10-CM | POA: Diagnosis not present

## 2019-06-05 DIAGNOSIS — D509 Iron deficiency anemia, unspecified: Secondary | ICD-10-CM | POA: Diagnosis not present

## 2019-06-08 DIAGNOSIS — D509 Iron deficiency anemia, unspecified: Secondary | ICD-10-CM | POA: Diagnosis not present

## 2019-06-08 DIAGNOSIS — N2581 Secondary hyperparathyroidism of renal origin: Secondary | ICD-10-CM | POA: Diagnosis not present

## 2019-06-08 DIAGNOSIS — N186 End stage renal disease: Secondary | ICD-10-CM | POA: Diagnosis not present

## 2019-06-10 DIAGNOSIS — N2581 Secondary hyperparathyroidism of renal origin: Secondary | ICD-10-CM | POA: Diagnosis not present

## 2019-06-10 DIAGNOSIS — N186 End stage renal disease: Secondary | ICD-10-CM | POA: Diagnosis not present

## 2019-06-10 DIAGNOSIS — D509 Iron deficiency anemia, unspecified: Secondary | ICD-10-CM | POA: Diagnosis not present

## 2019-06-11 ENCOUNTER — Other Ambulatory Visit: Payer: Medicare Other

## 2019-06-11 ENCOUNTER — Other Ambulatory Visit: Payer: Self-pay

## 2019-06-11 DIAGNOSIS — Z79899 Other long term (current) drug therapy: Secondary | ICD-10-CM

## 2019-06-11 DIAGNOSIS — Z992 Dependence on renal dialysis: Secondary | ICD-10-CM

## 2019-06-11 DIAGNOSIS — B20 Human immunodeficiency virus [HIV] disease: Secondary | ICD-10-CM | POA: Diagnosis not present

## 2019-06-11 DIAGNOSIS — Z21 Asymptomatic human immunodeficiency virus [HIV] infection status: Secondary | ICD-10-CM | POA: Diagnosis not present

## 2019-06-11 DIAGNOSIS — N186 End stage renal disease: Secondary | ICD-10-CM

## 2019-06-11 DIAGNOSIS — F321 Major depressive disorder, single episode, moderate: Secondary | ICD-10-CM | POA: Diagnosis not present

## 2019-06-11 DIAGNOSIS — I10 Essential (primary) hypertension: Secondary | ICD-10-CM

## 2019-06-11 MED FILL — BIKTARVY 50-200-25 MG TABS: 50-200-25 | 30 days supply | Qty: 30 | Fill #3

## 2019-06-12 DIAGNOSIS — N186 End stage renal disease: Secondary | ICD-10-CM | POA: Diagnosis not present

## 2019-06-12 DIAGNOSIS — D509 Iron deficiency anemia, unspecified: Secondary | ICD-10-CM | POA: Diagnosis not present

## 2019-06-12 DIAGNOSIS — N2581 Secondary hyperparathyroidism of renal origin: Secondary | ICD-10-CM | POA: Diagnosis not present

## 2019-06-12 LAB — T-HELPER CELL (CD4) - (RCID CLINIC ONLY)
CD4 % Helper T Cell: 29 % — ABNORMAL LOW (ref 33–65)
CD4 T Cell Abs: 383 /uL — ABNORMAL LOW (ref 400–1790)

## 2019-06-13 DIAGNOSIS — I120 Hypertensive chronic kidney disease with stage 5 chronic kidney disease or end stage renal disease: Secondary | ICD-10-CM | POA: Diagnosis not present

## 2019-06-13 DIAGNOSIS — N186 End stage renal disease: Secondary | ICD-10-CM | POA: Diagnosis not present

## 2019-06-13 DIAGNOSIS — Z992 Dependence on renal dialysis: Secondary | ICD-10-CM | POA: Diagnosis not present

## 2019-06-15 DIAGNOSIS — Z992 Dependence on renal dialysis: Secondary | ICD-10-CM | POA: Diagnosis not present

## 2019-06-15 DIAGNOSIS — D509 Iron deficiency anemia, unspecified: Secondary | ICD-10-CM | POA: Diagnosis not present

## 2019-06-15 DIAGNOSIS — N2581 Secondary hyperparathyroidism of renal origin: Secondary | ICD-10-CM | POA: Diagnosis not present

## 2019-06-15 DIAGNOSIS — N186 End stage renal disease: Secondary | ICD-10-CM | POA: Diagnosis not present

## 2019-06-17 DIAGNOSIS — N2581 Secondary hyperparathyroidism of renal origin: Secondary | ICD-10-CM | POA: Diagnosis not present

## 2019-06-17 DIAGNOSIS — N186 End stage renal disease: Secondary | ICD-10-CM | POA: Diagnosis not present

## 2019-06-17 DIAGNOSIS — Z992 Dependence on renal dialysis: Secondary | ICD-10-CM | POA: Diagnosis not present

## 2019-06-17 DIAGNOSIS — D509 Iron deficiency anemia, unspecified: Secondary | ICD-10-CM | POA: Diagnosis not present

## 2019-06-19 DIAGNOSIS — Z992 Dependence on renal dialysis: Secondary | ICD-10-CM | POA: Diagnosis not present

## 2019-06-19 DIAGNOSIS — N186 End stage renal disease: Secondary | ICD-10-CM | POA: Diagnosis not present

## 2019-06-19 DIAGNOSIS — N2581 Secondary hyperparathyroidism of renal origin: Secondary | ICD-10-CM | POA: Diagnosis not present

## 2019-06-19 DIAGNOSIS — D509 Iron deficiency anemia, unspecified: Secondary | ICD-10-CM | POA: Diagnosis not present

## 2019-06-19 LAB — CBC WITH DIFFERENTIAL/PLATELET
Absolute Monocytes: 401 cells/uL (ref 200–950)
Basophils Absolute: 59 cells/uL (ref 0–200)
Basophils Relative: 1 %
Eosinophils Absolute: 83 cells/uL (ref 15–500)
Eosinophils Relative: 1.4 %
HCT: 37.1 % (ref 35.0–45.0)
Hemoglobin: 13.2 g/dL (ref 11.7–15.5)
Lymphs Abs: 1552 cells/uL (ref 850–3900)
MCH: 35.2 pg — ABNORMAL HIGH (ref 27.0–33.0)
MCHC: 35.6 g/dL (ref 32.0–36.0)
MCV: 98.9 fL (ref 80.0–100.0)
MPV: 10.1 fL (ref 7.5–12.5)
Monocytes Relative: 6.8 %
Neutro Abs: 3806 cells/uL (ref 1500–7800)
Neutrophils Relative %: 64.5 %
Platelets: 225 10*3/uL (ref 140–400)
RBC: 3.75 10*6/uL — ABNORMAL LOW (ref 3.80–5.10)
RDW: 12.8 % (ref 11.0–15.0)
Total Lymphocyte: 26.3 %
WBC: 5.9 10*3/uL (ref 3.8–10.8)

## 2019-06-19 LAB — LIPID PANEL
Cholesterol: 248 mg/dL — ABNORMAL HIGH (ref <200)
HDL: 49 mg/dL — ABNORMAL LOW (ref >=50)
LDL Cholesterol (Calc): 175 mg/dL (calc) — ABNORMAL HIGH
Non-HDL Cholesterol (Calc): 199 mg/dL (calc) — ABNORMAL HIGH (ref <130)
Total CHOL/HDL Ratio: 5.1 (calc) — ABNORMAL HIGH (ref <5.0)
Triglycerides: 117 mg/dL (ref <150)

## 2019-06-19 LAB — RPR: RPR Ser Ql: NONREACTIVE

## 2019-06-19 LAB — HIV-1 RNA QUANT-NO REFLEX-BLD
HIV 1 RNA Quant: <20 copies/mL
HIV-1 RNA Quant, Log: <1.3 Log copies/mL

## 2019-06-22 DIAGNOSIS — N186 End stage renal disease: Secondary | ICD-10-CM | POA: Diagnosis not present

## 2019-06-22 DIAGNOSIS — D509 Iron deficiency anemia, unspecified: Secondary | ICD-10-CM | POA: Diagnosis not present

## 2019-06-22 DIAGNOSIS — Z992 Dependence on renal dialysis: Secondary | ICD-10-CM | POA: Diagnosis not present

## 2019-06-22 DIAGNOSIS — N2581 Secondary hyperparathyroidism of renal origin: Secondary | ICD-10-CM | POA: Diagnosis not present

## 2019-06-24 DIAGNOSIS — N186 End stage renal disease: Secondary | ICD-10-CM | POA: Diagnosis not present

## 2019-06-24 DIAGNOSIS — N2581 Secondary hyperparathyroidism of renal origin: Secondary | ICD-10-CM | POA: Diagnosis not present

## 2019-06-24 DIAGNOSIS — Z992 Dependence on renal dialysis: Secondary | ICD-10-CM | POA: Diagnosis not present

## 2019-06-24 DIAGNOSIS — D509 Iron deficiency anemia, unspecified: Secondary | ICD-10-CM | POA: Diagnosis not present

## 2019-06-26 DIAGNOSIS — D509 Iron deficiency anemia, unspecified: Secondary | ICD-10-CM | POA: Diagnosis not present

## 2019-06-26 DIAGNOSIS — N186 End stage renal disease: Secondary | ICD-10-CM | POA: Diagnosis not present

## 2019-06-26 DIAGNOSIS — N2581 Secondary hyperparathyroidism of renal origin: Secondary | ICD-10-CM | POA: Diagnosis not present

## 2019-06-26 DIAGNOSIS — Z992 Dependence on renal dialysis: Secondary | ICD-10-CM | POA: Diagnosis not present

## 2019-06-29 DIAGNOSIS — N186 End stage renal disease: Secondary | ICD-10-CM | POA: Diagnosis not present

## 2019-06-29 DIAGNOSIS — D509 Iron deficiency anemia, unspecified: Secondary | ICD-10-CM | POA: Diagnosis not present

## 2019-06-29 DIAGNOSIS — N2581 Secondary hyperparathyroidism of renal origin: Secondary | ICD-10-CM | POA: Diagnosis not present

## 2019-06-29 DIAGNOSIS — Z992 Dependence on renal dialysis: Secondary | ICD-10-CM | POA: Diagnosis not present

## 2019-06-30 ENCOUNTER — Encounter: Payer: Self-pay | Admitting: Infectious Disease

## 2019-06-30 ENCOUNTER — Ambulatory Visit (INDEPENDENT_AMBULATORY_CARE_PROVIDER_SITE_OTHER): Payer: Medicare Other | Admitting: Infectious Disease

## 2019-06-30 ENCOUNTER — Other Ambulatory Visit: Payer: Self-pay

## 2019-06-30 VITALS — BP 152/89 | HR 73 | Temp 98.4°F

## 2019-06-30 DIAGNOSIS — I10 Essential (primary) hypertension: Secondary | ICD-10-CM | POA: Diagnosis not present

## 2019-06-30 DIAGNOSIS — B20 Human immunodeficiency virus [HIV] disease: Secondary | ICD-10-CM | POA: Diagnosis not present

## 2019-06-30 DIAGNOSIS — N186 End stage renal disease: Secondary | ICD-10-CM | POA: Diagnosis not present

## 2019-06-30 DIAGNOSIS — Z79899 Other long term (current) drug therapy: Secondary | ICD-10-CM

## 2019-06-30 DIAGNOSIS — Z992 Dependence on renal dialysis: Secondary | ICD-10-CM

## 2019-06-30 NOTE — Progress Notes (Signed)
Subjective:   Chief complaint: Follow-up for her HIV    Patient ID: Tiffany Golden, female    DOB: 1976/06/16, 43 y.o.   MRN: [id number]  HPI 43  y.o. female past medical history significant for HIV and AIDS that was diagnosed approximately 20 years ago. Patient was initially treated for HIV while pregnant. She was followed initially at [facility] and was involved in a clinical trials there. Last notes from [facility] indicated that she had thought of care between 2002 and 2010. When last seen she had undetectable viral load and healthy CD4 count and was on a regimen of Crixivan 800 mg BID, D4T 40 mg BID, Epivir 150 mg BID  Then changed to Tivicay BID, renally dosed epivir and weekly viread then to  daily ABC, epivir and BID Tivicay and now QD Tivicay, ziagen and epivir. ABC change was per wishes of her transplant MD at [facility].    When I saw her in the hospital it turned out that she did not been seen by me since 2016. She had been taking her medications in the interim.  She l filled her prescriptions of TIVICAY Epivir and Epzicom in December. She then became quite depressed when she lost her boyfriend who died after being killed in a motor vehicle accident by a drunk driver. This caused her to grieve profoundly. In addition to being her boyfriend he was also the only human besides her parents and him she had confided her HIV diagnosis.  She then had no one to talk to. She states that at home she still had a good supply of both Epzicom and Epivir and that she continue to take these medications but not the [place] when her last refill ran ou ensuing January. In the interim she was hospitalized to the teaching service in the context of acute encephalopathy to be due to excess baclofen and (in the context of chronic kidney disease and the patient being on hemodialysis, and of tramadol  Unfortunately we checked her viral load in the hospital it was over 8000 making [place] very concerned that she has had virological failure  with resistance. Unfortunately the the  is did not draw sufficient serum to run a genotype while she was an inpatient so repeat that now but the yield will be lower unfortunately has we instructed her to stop her Ziagen and Epivir.   She continues to be followed by Dr. Moshe Cipro and Dr. Jimmy Footman and also is seen by primary care physician. SHe is seeing Tiffany Golden for Ob/GYn care.  We will construct a salvage regimen in the interim and will ensure she is on continued PCP prophylaxis.  Was Biktarvy and Prezcobix.  In the interim since then she stopped the Prezcobix and continue the [place] alone.  She saw Terri Piedra who added AZT but she then drop that as well and she has remained suppressed on Biktarvy genotypic data that is been found since then has shown only a 184V.  Depression is much better managed currently on Zoloft and with counseling.  HIV diagnosis can play a role in her depression though most of it was driving her depression as mentioned previously was the loss of her boy friend.  She contracted HIV sexually through her husband and was diagnosed of pregnancy.  Her husband thought that he had got through a blood transfusion though I wonder if that was indeed the case or not.  She returns to clinic to see me today in August 2020.  She is  in good spirits.  Her virus remains nicely controlled on Biktarvy.  She is on the transplant list at [facility].      Past Medical History:  Diagnosis Date  . Anemia   . Chronic kidney disease   . Complication of anesthesia   . Dialysis patient [facility])    mon, wed 22  . ESRD (end stage renal disease) ([place])   . HIV infection ([place])   . Hypertension   . PONV (postoperative nausea and vomiting)   . Stroke ([place])   . TIA (transient ischemic attack)    Hx:of    Past Surgical History:  Procedure Laterality Date  . ARTERIOVENOUS GRAFT PLACEMENT  09/11/11   left arm  . CESAREAN SECTION    . DILITATION &  CURRETTAGE/HYSTROSCOPY WITH NOVASURE ABLATION N/A 05/03/2015   Procedure: DILATATION/HYSTEROSCOPY WITH NOVASURE ABLATION; uterine cavity length 5.0 cm, uterine cavity width 3.8 cm, power 105 watts; time 1 minute 12 seconds;  Surgeon: Jonnie Kind, MD;  [facility];  Service: Gynecology;  Laterality: N/A;  . INSERTION OF DIALYSIS CATHETER Right 09/01/2013   Procedure: INSERTION OF DIALYSIS CATHETER Right Internal Jugular;  Surgeon: Elam Dutch, MD;  Location: [place];  Service: Vascular;  Laterality: Right;  . PATCH ANGIOPLASTY Left 09/01/2013   Procedure: PATCH ANGIOPLASTY;  Surgeon: Elam Dutch, MD;  Location: [place];  Service: Vascular;  Laterality: Left;  . REVISON OF ARTERIOVENOUS FISTULA Left 123456   Procedure: Plication left arm fistula;  Surgeon: Elam Dutch, MD;  Location: [facility] OR;  Service: Vascular;  Laterality: Left;    Family History  Problem Relation Age of Onset  . Hyperlipidemia Mother   . Cancer - Other Mother        Hx partial hysterectomy  . Heart disease Father        Hx CABG      Social History   Socioeconomic History  . Marital status: Single    Spouse name: Not on file  . Number of children: Not on file  . Years of education: Not on file  . Highest education level: Not on file  Occupational History  . Not on file  Social Needs  . Financial resource strain: Not on file  . Food insecurity    Worry: Not on file    Inability: Not on file  . Transportation needs    Medical: Not on file    Non-medical: Not on file  Tobacco Use  . Smoking status: Former Smoker    Packs/day: 0.50    Years: 3.00    Pack years: 1.50    Types: Cigarettes    Quit date: 09/05/2009    Years since quitting: 9.8  . Smokeless tobacco: Never Used  Substance and Sexual Activity  . Alcohol use: No  . Drug use: No  . Sexual activity: Not Currently    Partners: Male    Comment: declined condoms  Lifestyle  . Physical activity    Days per week: Not on file     Minutes per session: Not on file  . Stress: Not on file  Relationships  . Social Herbalist on phone: Not on file    Gets together: Not on file    Attends religious service: Not on file    Active member of club or organization: Not on file    Attends meetings of clubs or organizations: Not on file    Relationship status: Not on file  Other Topics Concern  . Not on file  Social History Narrative  . Not on file    Allergies  Allergen Reactions  . Fortaz [Ceftazidime Sodium In D5w] Rash    Head-toe  . Sulfa Antibiotics Hives  . Vancomycin Rash    Head-toe     Current Outpatient Medications:  .  B Complex-C-Folic Acid (RENA-VITE RX) 1 MG TABS, Take 1 tablet by mouth daily., Disp: , Rfl:  .  BIKTARVY 50-200-25 MG TABS tablet, TAKE 1 TABLET BY MOUTH DAILY., Disp: 30 tablet, Rfl: 3 .  calcium carbonate (TUMS - DOSED IN MG ELEMENTAL CALCIUM) 500 MG chewable tablet, Chew 1-2 tablets by mouth daily as needed for indigestion or heartburn., Disp: , Rfl:  .  metoprolol tartrate (LOPRESSOR) 25 MG tablet, Take 12.5 mg by mouth 2 (two) times daily as needed (for blood pressure). Except takes none on dialysis days (Tuesday, Thursday, and Sunday)., Disp: , Rfl:  .  sertraline (ZOLOFT) 100 MG tablet, Take 1 tablet (100 mg total) by mouth daily., Disp: 30 tablet, Rfl: 5 .  benzonatate (TESSALON PERLES) 100 MG capsule, Take 1 capsule (100 mg total) by mouth 3 (three) times daily as needed., Disp: 20 capsule, Rfl: 0 .  fluticasone (FLONASE) 50 MCG/ACT nasal spray, Place 2 sprays into both nostrils daily., Disp: 16 g, Rfl: 6    Review of Systems  Constitutional: Negative for activity change, appetite change, chills, diaphoresis, fatigue, fever and unexpected weight change.  HENT: Negative for congestion, rhinorrhea, sinus pressure, sneezing, sore throat and trouble swallowing.   Eyes: Negative for photophobia and visual disturbance.  Respiratory: Negative for cough, chest tightness,  shortness of breath, wheezing and stridor.   Cardiovascular: Negative for chest pain, palpitations and leg swelling.  Gastrointestinal: Negative for abdominal distention, abdominal pain, anal bleeding, blood in stool, constipation, diarrhea, nausea and vomiting.  Musculoskeletal: Negative for arthralgias, back pain, gait problem, joint swelling and myalgias.  Skin: Negative for color change, pallor, rash and wound.  Neurological: Negative for dizziness, tremors, weakness and light-headedness.  Hematological: Negative for adenopathy. Does not bruise/bleed easily.  Psychiatric/Behavioral: Negative for agitation, behavioral problems, confusion, decreased concentration, self-injury, sleep disturbance and suicidal ideas.       Objective:   Physical Exam  Constitutional: She is oriented to person, place, and time. She appears well-developed and well-nourished. No distress.  HENT:  Head: Normocephalic and atraumatic.  Mouth/Throat: No oropharyngeal exudate.  Eyes: Conjunctivae and EOM are normal. No scleral icterus.  Neck: Normal range of motion. Neck supple.  Cardiovascular: Normal rate and regular rhythm.  Pulmonary/Chest: Effort normal. No respiratory distress. She has no wheezes.  Abdominal: She exhibits no distension.  Musculoskeletal:        General: No tenderness or edema.  Neurological: She is alert and oriented to person, place, and time. Coordination normal.  Skin: Skin is warm and dry. No rash noted. She is not diaphoretic. No erythema. No pallor.  Psychiatric: She has a normal mood and affect. Her behavior is normal. Judgment and thought content normal. She does not exhibit a depressed mood.          Assessment & Plan:  HIV and AIDS:  Continue Biktarvy alone.  I reminded her her that she is on a regimen that has only 2 fully active drugs though the N184V does sensitize her virus to the tenofovir.  -When she gets the transplant I still think this is probably the best regimen  for her to be on.  She tolerates  it quite well and I am not terribly concerned about TAF being dangerous to her transplant kidney if she gets one though if there is concern about that we can consider a different regimen.  She had trouble tolerating other antiretrovirals though and I would worry about adherence dropping off.      Hypertension : BP elevated on initial check today but this may have been partly due to stress I have emphasized again the need for good blood pressure control especially if she gets a transplanted kidney to try to keep kidney There were no vitals filed for this visit.   ESRD on HD: --continue HD .  She is on the transplant list again.  Depression on SSRI  I spent greater than 25 minutes with the patient including greater than 50% of time in face to face counsel of the patient guarding nature of her HIV disease how well she is doing well suppressing her virus and continue take her medications versus more than a year ago when she had poor adherence and was with a very high viral load and drop in her CD4 count, reviewing her blood pressure, her desire received kidney transplant and in coordination of care.

## 2019-07-01 DIAGNOSIS — D509 Iron deficiency anemia, unspecified: Secondary | ICD-10-CM | POA: Diagnosis not present

## 2019-07-01 DIAGNOSIS — N2581 Secondary hyperparathyroidism of renal origin: Secondary | ICD-10-CM | POA: Diagnosis not present

## 2019-07-01 DIAGNOSIS — Z992 Dependence on renal dialysis: Secondary | ICD-10-CM | POA: Diagnosis not present

## 2019-07-01 DIAGNOSIS — N186 End stage renal disease: Secondary | ICD-10-CM | POA: Diagnosis not present

## 2019-07-03 DIAGNOSIS — Z992 Dependence on renal dialysis: Secondary | ICD-10-CM | POA: Diagnosis not present

## 2019-07-03 DIAGNOSIS — N2581 Secondary hyperparathyroidism of renal origin: Secondary | ICD-10-CM | POA: Diagnosis not present

## 2019-07-03 DIAGNOSIS — N186 End stage renal disease: Secondary | ICD-10-CM | POA: Diagnosis not present

## 2019-07-03 DIAGNOSIS — D509 Iron deficiency anemia, unspecified: Secondary | ICD-10-CM | POA: Diagnosis not present

## 2019-07-06 DIAGNOSIS — Z992 Dependence on renal dialysis: Secondary | ICD-10-CM | POA: Diagnosis not present

## 2019-07-06 DIAGNOSIS — N2581 Secondary hyperparathyroidism of renal origin: Secondary | ICD-10-CM | POA: Diagnosis not present

## 2019-07-06 DIAGNOSIS — D509 Iron deficiency anemia, unspecified: Secondary | ICD-10-CM | POA: Diagnosis not present

## 2019-07-06 DIAGNOSIS — N186 End stage renal disease: Secondary | ICD-10-CM | POA: Diagnosis not present

## 2019-07-08 DIAGNOSIS — N186 End stage renal disease: Secondary | ICD-10-CM | POA: Diagnosis not present

## 2019-07-08 DIAGNOSIS — Z992 Dependence on renal dialysis: Secondary | ICD-10-CM | POA: Diagnosis not present

## 2019-07-08 DIAGNOSIS — D509 Iron deficiency anemia, unspecified: Secondary | ICD-10-CM | POA: Diagnosis not present

## 2019-07-08 DIAGNOSIS — N2581 Secondary hyperparathyroidism of renal origin: Secondary | ICD-10-CM | POA: Diagnosis not present

## 2019-07-10 DIAGNOSIS — N186 End stage renal disease: Secondary | ICD-10-CM | POA: Diagnosis not present

## 2019-07-10 DIAGNOSIS — Z992 Dependence on renal dialysis: Secondary | ICD-10-CM | POA: Diagnosis not present

## 2019-07-10 DIAGNOSIS — D509 Iron deficiency anemia, unspecified: Secondary | ICD-10-CM | POA: Diagnosis not present

## 2019-07-10 DIAGNOSIS — N2581 Secondary hyperparathyroidism of renal origin: Secondary | ICD-10-CM | POA: Diagnosis not present

## 2019-07-13 ENCOUNTER — Other Ambulatory Visit: Payer: Self-pay | Admitting: Family

## 2019-07-13 DIAGNOSIS — N186 End stage renal disease: Secondary | ICD-10-CM | POA: Diagnosis not present

## 2019-07-13 DIAGNOSIS — N2581 Secondary hyperparathyroidism of renal origin: Secondary | ICD-10-CM | POA: Diagnosis not present

## 2019-07-13 DIAGNOSIS — Z992 Dependence on renal dialysis: Secondary | ICD-10-CM | POA: Diagnosis not present

## 2019-07-13 DIAGNOSIS — D509 Iron deficiency anemia, unspecified: Secondary | ICD-10-CM | POA: Diagnosis not present

## 2019-07-14 DIAGNOSIS — N186 End stage renal disease: Secondary | ICD-10-CM | POA: Diagnosis not present

## 2019-07-14 DIAGNOSIS — Z992 Dependence on renal dialysis: Secondary | ICD-10-CM | POA: Diagnosis not present

## 2019-07-14 DIAGNOSIS — I120 Hypertensive chronic kidney disease with stage 5 chronic kidney disease or end stage renal disease: Secondary | ICD-10-CM | POA: Diagnosis not present

## 2019-07-15 DIAGNOSIS — N2581 Secondary hyperparathyroidism of renal origin: Secondary | ICD-10-CM | POA: Diagnosis not present

## 2019-07-15 DIAGNOSIS — N186 End stage renal disease: Secondary | ICD-10-CM | POA: Diagnosis not present

## 2019-07-15 DIAGNOSIS — D509 Iron deficiency anemia, unspecified: Secondary | ICD-10-CM | POA: Diagnosis not present

## 2019-07-15 DIAGNOSIS — Z23 Encounter for immunization: Secondary | ICD-10-CM | POA: Diagnosis not present

## 2019-07-15 DIAGNOSIS — Z992 Dependence on renal dialysis: Secondary | ICD-10-CM | POA: Diagnosis not present

## 2019-07-16 MED FILL — BIKTARVY 50-200-25 MG TABS: 50-200-25 | 30 days supply | Qty: 30 | Fill #0

## 2019-07-17 DIAGNOSIS — N186 End stage renal disease: Secondary | ICD-10-CM | POA: Diagnosis not present

## 2019-07-17 DIAGNOSIS — Z992 Dependence on renal dialysis: Secondary | ICD-10-CM | POA: Diagnosis not present

## 2019-07-17 DIAGNOSIS — N2581 Secondary hyperparathyroidism of renal origin: Secondary | ICD-10-CM | POA: Diagnosis not present

## 2019-07-17 DIAGNOSIS — D509 Iron deficiency anemia, unspecified: Secondary | ICD-10-CM | POA: Diagnosis not present

## 2019-07-17 DIAGNOSIS — Z23 Encounter for immunization: Secondary | ICD-10-CM | POA: Diagnosis not present

## 2019-07-20 DIAGNOSIS — Z23 Encounter for immunization: Secondary | ICD-10-CM | POA: Diagnosis not present

## 2019-07-20 DIAGNOSIS — D509 Iron deficiency anemia, unspecified: Secondary | ICD-10-CM | POA: Diagnosis not present

## 2019-07-20 DIAGNOSIS — N2581 Secondary hyperparathyroidism of renal origin: Secondary | ICD-10-CM | POA: Diagnosis not present

## 2019-07-20 DIAGNOSIS — Z992 Dependence on renal dialysis: Secondary | ICD-10-CM | POA: Diagnosis not present

## 2019-07-20 DIAGNOSIS — N186 End stage renal disease: Secondary | ICD-10-CM | POA: Diagnosis not present

## 2019-07-22 DIAGNOSIS — D509 Iron deficiency anemia, unspecified: Secondary | ICD-10-CM | POA: Diagnosis not present

## 2019-07-22 DIAGNOSIS — N186 End stage renal disease: Secondary | ICD-10-CM | POA: Diagnosis not present

## 2019-07-22 DIAGNOSIS — Z23 Encounter for immunization: Secondary | ICD-10-CM | POA: Diagnosis not present

## 2019-07-22 DIAGNOSIS — N2581 Secondary hyperparathyroidism of renal origin: Secondary | ICD-10-CM | POA: Diagnosis not present

## 2019-07-22 DIAGNOSIS — Z992 Dependence on renal dialysis: Secondary | ICD-10-CM | POA: Diagnosis not present

## 2019-07-24 DIAGNOSIS — N2581 Secondary hyperparathyroidism of renal origin: Secondary | ICD-10-CM | POA: Diagnosis not present

## 2019-07-24 DIAGNOSIS — N186 End stage renal disease: Secondary | ICD-10-CM | POA: Diagnosis not present

## 2019-07-24 DIAGNOSIS — Z23 Encounter for immunization: Secondary | ICD-10-CM | POA: Diagnosis not present

## 2019-07-24 DIAGNOSIS — D509 Iron deficiency anemia, unspecified: Secondary | ICD-10-CM | POA: Diagnosis not present

## 2019-07-24 DIAGNOSIS — Z992 Dependence on renal dialysis: Secondary | ICD-10-CM | POA: Diagnosis not present

## 2019-07-27 DIAGNOSIS — Z992 Dependence on renal dialysis: Secondary | ICD-10-CM | POA: Diagnosis not present

## 2019-07-27 DIAGNOSIS — N186 End stage renal disease: Secondary | ICD-10-CM | POA: Diagnosis not present

## 2019-07-27 DIAGNOSIS — Z23 Encounter for immunization: Secondary | ICD-10-CM | POA: Diagnosis not present

## 2019-07-27 DIAGNOSIS — N2581 Secondary hyperparathyroidism of renal origin: Secondary | ICD-10-CM | POA: Diagnosis not present

## 2019-07-27 DIAGNOSIS — D509 Iron deficiency anemia, unspecified: Secondary | ICD-10-CM | POA: Diagnosis not present

## 2019-07-29 DIAGNOSIS — D509 Iron deficiency anemia, unspecified: Secondary | ICD-10-CM | POA: Diagnosis not present

## 2019-07-29 DIAGNOSIS — N2581 Secondary hyperparathyroidism of renal origin: Secondary | ICD-10-CM | POA: Diagnosis not present

## 2019-07-29 DIAGNOSIS — Z992 Dependence on renal dialysis: Secondary | ICD-10-CM | POA: Diagnosis not present

## 2019-07-29 DIAGNOSIS — N186 End stage renal disease: Secondary | ICD-10-CM | POA: Diagnosis not present

## 2019-07-29 DIAGNOSIS — Z23 Encounter for immunization: Secondary | ICD-10-CM | POA: Diagnosis not present

## 2019-07-31 DIAGNOSIS — Z23 Encounter for immunization: Secondary | ICD-10-CM | POA: Diagnosis not present

## 2019-07-31 DIAGNOSIS — Z992 Dependence on renal dialysis: Secondary | ICD-10-CM | POA: Diagnosis not present

## 2019-07-31 DIAGNOSIS — N186 End stage renal disease: Secondary | ICD-10-CM | POA: Diagnosis not present

## 2019-07-31 DIAGNOSIS — N2581 Secondary hyperparathyroidism of renal origin: Secondary | ICD-10-CM | POA: Diagnosis not present

## 2019-07-31 DIAGNOSIS — D509 Iron deficiency anemia, unspecified: Secondary | ICD-10-CM | POA: Diagnosis not present

## 2019-08-03 DIAGNOSIS — N2581 Secondary hyperparathyroidism of renal origin: Secondary | ICD-10-CM | POA: Diagnosis not present

## 2019-08-03 DIAGNOSIS — D509 Iron deficiency anemia, unspecified: Secondary | ICD-10-CM | POA: Diagnosis not present

## 2019-08-03 DIAGNOSIS — N186 End stage renal disease: Secondary | ICD-10-CM | POA: Diagnosis not present

## 2019-08-03 DIAGNOSIS — Z23 Encounter for immunization: Secondary | ICD-10-CM | POA: Diagnosis not present

## 2019-08-03 DIAGNOSIS — Z992 Dependence on renal dialysis: Secondary | ICD-10-CM | POA: Diagnosis not present

## 2019-08-05 DIAGNOSIS — N186 End stage renal disease: Secondary | ICD-10-CM | POA: Diagnosis not present

## 2019-08-05 DIAGNOSIS — Z23 Encounter for immunization: Secondary | ICD-10-CM | POA: Diagnosis not present

## 2019-08-05 DIAGNOSIS — D509 Iron deficiency anemia, unspecified: Secondary | ICD-10-CM | POA: Diagnosis not present

## 2019-08-05 DIAGNOSIS — N2581 Secondary hyperparathyroidism of renal origin: Secondary | ICD-10-CM | POA: Diagnosis not present

## 2019-08-05 DIAGNOSIS — Z992 Dependence on renal dialysis: Secondary | ICD-10-CM | POA: Diagnosis not present

## 2019-08-07 DIAGNOSIS — N186 End stage renal disease: Secondary | ICD-10-CM | POA: Diagnosis not present

## 2019-08-07 DIAGNOSIS — Z23 Encounter for immunization: Secondary | ICD-10-CM | POA: Diagnosis not present

## 2019-08-07 DIAGNOSIS — N2581 Secondary hyperparathyroidism of renal origin: Secondary | ICD-10-CM | POA: Diagnosis not present

## 2019-08-07 DIAGNOSIS — D509 Iron deficiency anemia, unspecified: Secondary | ICD-10-CM | POA: Diagnosis not present

## 2019-08-07 DIAGNOSIS — Z992 Dependence on renal dialysis: Secondary | ICD-10-CM | POA: Diagnosis not present

## 2019-08-10 DIAGNOSIS — D509 Iron deficiency anemia, unspecified: Secondary | ICD-10-CM | POA: Diagnosis not present

## 2019-08-10 DIAGNOSIS — N186 End stage renal disease: Secondary | ICD-10-CM | POA: Diagnosis not present

## 2019-08-10 DIAGNOSIS — Z992 Dependence on renal dialysis: Secondary | ICD-10-CM | POA: Diagnosis not present

## 2019-08-10 DIAGNOSIS — Z23 Encounter for immunization: Secondary | ICD-10-CM | POA: Diagnosis not present

## 2019-08-10 DIAGNOSIS — N2581 Secondary hyperparathyroidism of renal origin: Secondary | ICD-10-CM | POA: Diagnosis not present

## 2019-08-12 DIAGNOSIS — D509 Iron deficiency anemia, unspecified: Secondary | ICD-10-CM | POA: Diagnosis not present

## 2019-08-12 DIAGNOSIS — Z23 Encounter for immunization: Secondary | ICD-10-CM | POA: Diagnosis not present

## 2019-08-12 DIAGNOSIS — Z992 Dependence on renal dialysis: Secondary | ICD-10-CM | POA: Diagnosis not present

## 2019-08-12 DIAGNOSIS — N186 End stage renal disease: Secondary | ICD-10-CM | POA: Diagnosis not present

## 2019-08-12 DIAGNOSIS — N2581 Secondary hyperparathyroidism of renal origin: Secondary | ICD-10-CM | POA: Diagnosis not present

## 2019-08-13 DIAGNOSIS — I120 Hypertensive chronic kidney disease with stage 5 chronic kidney disease or end stage renal disease: Secondary | ICD-10-CM | POA: Diagnosis not present

## 2019-08-13 DIAGNOSIS — N186 End stage renal disease: Secondary | ICD-10-CM | POA: Diagnosis not present

## 2019-08-13 DIAGNOSIS — Z992 Dependence on renal dialysis: Secondary | ICD-10-CM | POA: Diagnosis not present

## 2019-08-14 DIAGNOSIS — N186 End stage renal disease: Secondary | ICD-10-CM | POA: Diagnosis not present

## 2019-08-14 DIAGNOSIS — Z992 Dependence on renal dialysis: Secondary | ICD-10-CM | POA: Diagnosis not present

## 2019-08-14 DIAGNOSIS — N2581 Secondary hyperparathyroidism of renal origin: Secondary | ICD-10-CM | POA: Diagnosis not present

## 2019-08-14 DIAGNOSIS — D509 Iron deficiency anemia, unspecified: Secondary | ICD-10-CM | POA: Diagnosis not present

## 2019-08-17 DIAGNOSIS — N186 End stage renal disease: Secondary | ICD-10-CM | POA: Diagnosis not present

## 2019-08-17 DIAGNOSIS — Z992 Dependence on renal dialysis: Secondary | ICD-10-CM | POA: Diagnosis not present

## 2019-08-17 DIAGNOSIS — D509 Iron deficiency anemia, unspecified: Secondary | ICD-10-CM | POA: Diagnosis not present

## 2019-08-17 DIAGNOSIS — N2581 Secondary hyperparathyroidism of renal origin: Secondary | ICD-10-CM | POA: Diagnosis not present

## 2019-08-19 DIAGNOSIS — Z992 Dependence on renal dialysis: Secondary | ICD-10-CM | POA: Diagnosis not present

## 2019-08-19 DIAGNOSIS — N186 End stage renal disease: Secondary | ICD-10-CM | POA: Diagnosis not present

## 2019-08-19 DIAGNOSIS — N2581 Secondary hyperparathyroidism of renal origin: Secondary | ICD-10-CM | POA: Diagnosis not present

## 2019-08-19 DIAGNOSIS — D509 Iron deficiency anemia, unspecified: Secondary | ICD-10-CM | POA: Diagnosis not present

## 2019-08-21 DIAGNOSIS — D509 Iron deficiency anemia, unspecified: Secondary | ICD-10-CM | POA: Diagnosis not present

## 2019-08-21 DIAGNOSIS — N2581 Secondary hyperparathyroidism of renal origin: Secondary | ICD-10-CM | POA: Diagnosis not present

## 2019-08-21 DIAGNOSIS — N186 End stage renal disease: Secondary | ICD-10-CM | POA: Diagnosis not present

## 2019-08-21 DIAGNOSIS — Z992 Dependence on renal dialysis: Secondary | ICD-10-CM | POA: Diagnosis not present

## 2019-08-24 DIAGNOSIS — N2581 Secondary hyperparathyroidism of renal origin: Secondary | ICD-10-CM | POA: Diagnosis not present

## 2019-08-24 DIAGNOSIS — D509 Iron deficiency anemia, unspecified: Secondary | ICD-10-CM | POA: Diagnosis not present

## 2019-08-24 DIAGNOSIS — N186 End stage renal disease: Secondary | ICD-10-CM | POA: Diagnosis not present

## 2019-08-24 DIAGNOSIS — Z992 Dependence on renal dialysis: Secondary | ICD-10-CM | POA: Diagnosis not present

## 2019-08-24 MED FILL — BIKTARVY 50-200-25 MG TABS: 50-200-25 | 30 days supply | Qty: 30 | Fill #1

## 2019-08-26 DIAGNOSIS — D509 Iron deficiency anemia, unspecified: Secondary | ICD-10-CM | POA: Diagnosis not present

## 2019-08-26 DIAGNOSIS — Z992 Dependence on renal dialysis: Secondary | ICD-10-CM | POA: Diagnosis not present

## 2019-08-26 DIAGNOSIS — N186 End stage renal disease: Secondary | ICD-10-CM | POA: Diagnosis not present

## 2019-08-26 DIAGNOSIS — N2581 Secondary hyperparathyroidism of renal origin: Secondary | ICD-10-CM | POA: Diagnosis not present

## 2019-08-28 DIAGNOSIS — N186 End stage renal disease: Secondary | ICD-10-CM | POA: Diagnosis not present

## 2019-08-28 DIAGNOSIS — N2581 Secondary hyperparathyroidism of renal origin: Secondary | ICD-10-CM | POA: Diagnosis not present

## 2019-08-28 DIAGNOSIS — D509 Iron deficiency anemia, unspecified: Secondary | ICD-10-CM | POA: Diagnosis not present

## 2019-08-28 DIAGNOSIS — Z992 Dependence on renal dialysis: Secondary | ICD-10-CM | POA: Diagnosis not present

## 2019-08-31 DIAGNOSIS — Z992 Dependence on renal dialysis: Secondary | ICD-10-CM | POA: Diagnosis not present

## 2019-08-31 DIAGNOSIS — D509 Iron deficiency anemia, unspecified: Secondary | ICD-10-CM | POA: Diagnosis not present

## 2019-08-31 DIAGNOSIS — N2581 Secondary hyperparathyroidism of renal origin: Secondary | ICD-10-CM | POA: Diagnosis not present

## 2019-08-31 DIAGNOSIS — N186 End stage renal disease: Secondary | ICD-10-CM | POA: Diagnosis not present

## 2019-09-02 DIAGNOSIS — N186 End stage renal disease: Secondary | ICD-10-CM | POA: Diagnosis not present

## 2019-09-02 DIAGNOSIS — Z992 Dependence on renal dialysis: Secondary | ICD-10-CM | POA: Diagnosis not present

## 2019-09-02 DIAGNOSIS — D509 Iron deficiency anemia, unspecified: Secondary | ICD-10-CM | POA: Diagnosis not present

## 2019-09-02 DIAGNOSIS — N2581 Secondary hyperparathyroidism of renal origin: Secondary | ICD-10-CM | POA: Diagnosis not present

## 2019-09-04 DIAGNOSIS — N2581 Secondary hyperparathyroidism of renal origin: Secondary | ICD-10-CM | POA: Diagnosis not present

## 2019-09-04 DIAGNOSIS — Z992 Dependence on renal dialysis: Secondary | ICD-10-CM | POA: Diagnosis not present

## 2019-09-04 DIAGNOSIS — D509 Iron deficiency anemia, unspecified: Secondary | ICD-10-CM | POA: Diagnosis not present

## 2019-09-04 DIAGNOSIS — N186 End stage renal disease: Secondary | ICD-10-CM | POA: Diagnosis not present

## 2019-09-07 DIAGNOSIS — N2581 Secondary hyperparathyroidism of renal origin: Secondary | ICD-10-CM | POA: Diagnosis not present

## 2019-09-07 DIAGNOSIS — Z992 Dependence on renal dialysis: Secondary | ICD-10-CM | POA: Diagnosis not present

## 2019-09-07 DIAGNOSIS — N186 End stage renal disease: Secondary | ICD-10-CM | POA: Diagnosis not present

## 2019-09-07 DIAGNOSIS — D509 Iron deficiency anemia, unspecified: Secondary | ICD-10-CM | POA: Diagnosis not present

## 2019-09-09 DIAGNOSIS — N186 End stage renal disease: Secondary | ICD-10-CM | POA: Diagnosis not present

## 2019-09-09 DIAGNOSIS — D509 Iron deficiency anemia, unspecified: Secondary | ICD-10-CM | POA: Diagnosis not present

## 2019-09-09 DIAGNOSIS — N2581 Secondary hyperparathyroidism of renal origin: Secondary | ICD-10-CM | POA: Diagnosis not present

## 2019-09-09 DIAGNOSIS — Z992 Dependence on renal dialysis: Secondary | ICD-10-CM | POA: Diagnosis not present

## 2019-09-11 DIAGNOSIS — Z992 Dependence on renal dialysis: Secondary | ICD-10-CM | POA: Diagnosis not present

## 2019-09-11 DIAGNOSIS — D509 Iron deficiency anemia, unspecified: Secondary | ICD-10-CM | POA: Diagnosis not present

## 2019-09-11 DIAGNOSIS — N186 End stage renal disease: Secondary | ICD-10-CM | POA: Diagnosis not present

## 2019-09-11 DIAGNOSIS — N2581 Secondary hyperparathyroidism of renal origin: Secondary | ICD-10-CM | POA: Diagnosis not present

## 2019-09-13 DIAGNOSIS — Z992 Dependence on renal dialysis: Secondary | ICD-10-CM | POA: Diagnosis not present

## 2019-09-13 DIAGNOSIS — N186 End stage renal disease: Secondary | ICD-10-CM | POA: Diagnosis not present

## 2019-09-13 DIAGNOSIS — I120 Hypertensive chronic kidney disease with stage 5 chronic kidney disease or end stage renal disease: Secondary | ICD-10-CM | POA: Diagnosis not present

## 2019-09-14 DIAGNOSIS — N186 End stage renal disease: Secondary | ICD-10-CM | POA: Diagnosis not present

## 2019-09-14 DIAGNOSIS — N2581 Secondary hyperparathyroidism of renal origin: Secondary | ICD-10-CM | POA: Diagnosis not present

## 2019-09-14 DIAGNOSIS — Z992 Dependence on renal dialysis: Secondary | ICD-10-CM | POA: Diagnosis not present

## 2019-09-14 DIAGNOSIS — D509 Iron deficiency anemia, unspecified: Secondary | ICD-10-CM | POA: Diagnosis not present

## 2019-09-14 DIAGNOSIS — D631 Anemia in chronic kidney disease: Secondary | ICD-10-CM | POA: Diagnosis not present

## 2019-09-16 DIAGNOSIS — D631 Anemia in chronic kidney disease: Secondary | ICD-10-CM | POA: Diagnosis not present

## 2019-09-16 DIAGNOSIS — N186 End stage renal disease: Secondary | ICD-10-CM | POA: Diagnosis not present

## 2019-09-16 DIAGNOSIS — D509 Iron deficiency anemia, unspecified: Secondary | ICD-10-CM | POA: Diagnosis not present

## 2019-09-16 DIAGNOSIS — Z992 Dependence on renal dialysis: Secondary | ICD-10-CM | POA: Diagnosis not present

## 2019-09-16 DIAGNOSIS — N2581 Secondary hyperparathyroidism of renal origin: Secondary | ICD-10-CM | POA: Diagnosis not present

## 2019-09-18 DIAGNOSIS — D631 Anemia in chronic kidney disease: Secondary | ICD-10-CM | POA: Diagnosis not present

## 2019-09-18 DIAGNOSIS — Z992 Dependence on renal dialysis: Secondary | ICD-10-CM | POA: Diagnosis not present

## 2019-09-18 DIAGNOSIS — D509 Iron deficiency anemia, unspecified: Secondary | ICD-10-CM | POA: Diagnosis not present

## 2019-09-18 DIAGNOSIS — N2581 Secondary hyperparathyroidism of renal origin: Secondary | ICD-10-CM | POA: Diagnosis not present

## 2019-09-18 DIAGNOSIS — N186 End stage renal disease: Secondary | ICD-10-CM | POA: Diagnosis not present

## 2019-09-21 DIAGNOSIS — D509 Iron deficiency anemia, unspecified: Secondary | ICD-10-CM | POA: Diagnosis not present

## 2019-09-21 DIAGNOSIS — N2581 Secondary hyperparathyroidism of renal origin: Secondary | ICD-10-CM | POA: Diagnosis not present

## 2019-09-21 DIAGNOSIS — Z992 Dependence on renal dialysis: Secondary | ICD-10-CM | POA: Diagnosis not present

## 2019-09-21 DIAGNOSIS — N186 End stage renal disease: Secondary | ICD-10-CM | POA: Diagnosis not present

## 2019-09-21 DIAGNOSIS — D631 Anemia in chronic kidney disease: Secondary | ICD-10-CM | POA: Diagnosis not present

## 2019-09-23 DIAGNOSIS — N186 End stage renal disease: Secondary | ICD-10-CM | POA: Diagnosis not present

## 2019-09-23 DIAGNOSIS — D509 Iron deficiency anemia, unspecified: Secondary | ICD-10-CM | POA: Diagnosis not present

## 2019-09-23 DIAGNOSIS — Z992 Dependence on renal dialysis: Secondary | ICD-10-CM | POA: Diagnosis not present

## 2019-09-23 DIAGNOSIS — N2581 Secondary hyperparathyroidism of renal origin: Secondary | ICD-10-CM | POA: Diagnosis not present

## 2019-09-23 DIAGNOSIS — D631 Anemia in chronic kidney disease: Secondary | ICD-10-CM | POA: Diagnosis not present

## 2019-09-25 DIAGNOSIS — Z992 Dependence on renal dialysis: Secondary | ICD-10-CM | POA: Diagnosis not present

## 2019-09-25 DIAGNOSIS — N2581 Secondary hyperparathyroidism of renal origin: Secondary | ICD-10-CM | POA: Diagnosis not present

## 2019-09-25 DIAGNOSIS — D509 Iron deficiency anemia, unspecified: Secondary | ICD-10-CM | POA: Diagnosis not present

## 2019-09-25 DIAGNOSIS — N186 End stage renal disease: Secondary | ICD-10-CM | POA: Diagnosis not present

## 2019-09-25 DIAGNOSIS — D631 Anemia in chronic kidney disease: Secondary | ICD-10-CM | POA: Diagnosis not present

## 2019-09-28 DIAGNOSIS — D509 Iron deficiency anemia, unspecified: Secondary | ICD-10-CM | POA: Diagnosis not present

## 2019-09-28 DIAGNOSIS — N186 End stage renal disease: Secondary | ICD-10-CM | POA: Diagnosis not present

## 2019-09-28 DIAGNOSIS — D631 Anemia in chronic kidney disease: Secondary | ICD-10-CM | POA: Diagnosis not present

## 2019-09-28 DIAGNOSIS — N2581 Secondary hyperparathyroidism of renal origin: Secondary | ICD-10-CM | POA: Diagnosis not present

## 2019-09-28 DIAGNOSIS — Z992 Dependence on renal dialysis: Secondary | ICD-10-CM | POA: Diagnosis not present

## 2019-09-28 MED FILL — BIKTARVY 50-200-25 MG TABS: 50-200-25 | 30 days supply | Qty: 30 | Fill #2

## 2019-09-30 DIAGNOSIS — Z992 Dependence on renal dialysis: Secondary | ICD-10-CM | POA: Diagnosis not present

## 2019-09-30 DIAGNOSIS — D631 Anemia in chronic kidney disease: Secondary | ICD-10-CM | POA: Diagnosis not present

## 2019-09-30 DIAGNOSIS — N2581 Secondary hyperparathyroidism of renal origin: Secondary | ICD-10-CM | POA: Diagnosis not present

## 2019-09-30 DIAGNOSIS — D509 Iron deficiency anemia, unspecified: Secondary | ICD-10-CM | POA: Diagnosis not present

## 2019-09-30 DIAGNOSIS — N186 End stage renal disease: Secondary | ICD-10-CM | POA: Diagnosis not present

## 2019-10-02 DIAGNOSIS — Z992 Dependence on renal dialysis: Secondary | ICD-10-CM | POA: Diagnosis not present

## 2019-10-02 DIAGNOSIS — D509 Iron deficiency anemia, unspecified: Secondary | ICD-10-CM | POA: Diagnosis not present

## 2019-10-02 DIAGNOSIS — N2581 Secondary hyperparathyroidism of renal origin: Secondary | ICD-10-CM | POA: Diagnosis not present

## 2019-10-02 DIAGNOSIS — D631 Anemia in chronic kidney disease: Secondary | ICD-10-CM | POA: Diagnosis not present

## 2019-10-02 DIAGNOSIS — N186 End stage renal disease: Secondary | ICD-10-CM | POA: Diagnosis not present

## 2019-10-04 DIAGNOSIS — D631 Anemia in chronic kidney disease: Secondary | ICD-10-CM | POA: Diagnosis not present

## 2019-10-04 DIAGNOSIS — N186 End stage renal disease: Secondary | ICD-10-CM | POA: Diagnosis not present

## 2019-10-04 DIAGNOSIS — Z992 Dependence on renal dialysis: Secondary | ICD-10-CM | POA: Diagnosis not present

## 2019-10-04 DIAGNOSIS — D509 Iron deficiency anemia, unspecified: Secondary | ICD-10-CM | POA: Diagnosis not present

## 2019-10-04 DIAGNOSIS — N2581 Secondary hyperparathyroidism of renal origin: Secondary | ICD-10-CM | POA: Diagnosis not present

## 2019-10-06 DIAGNOSIS — D631 Anemia in chronic kidney disease: Secondary | ICD-10-CM | POA: Diagnosis not present

## 2019-10-06 DIAGNOSIS — D509 Iron deficiency anemia, unspecified: Secondary | ICD-10-CM | POA: Diagnosis not present

## 2019-10-06 DIAGNOSIS — Z992 Dependence on renal dialysis: Secondary | ICD-10-CM | POA: Diagnosis not present

## 2019-10-06 DIAGNOSIS — N186 End stage renal disease: Secondary | ICD-10-CM | POA: Diagnosis not present

## 2019-10-06 DIAGNOSIS — N2581 Secondary hyperparathyroidism of renal origin: Secondary | ICD-10-CM | POA: Diagnosis not present

## 2019-10-09 DIAGNOSIS — N2581 Secondary hyperparathyroidism of renal origin: Secondary | ICD-10-CM | POA: Diagnosis not present

## 2019-10-09 DIAGNOSIS — Z992 Dependence on renal dialysis: Secondary | ICD-10-CM | POA: Diagnosis not present

## 2019-10-09 DIAGNOSIS — N186 End stage renal disease: Secondary | ICD-10-CM | POA: Diagnosis not present

## 2019-10-09 DIAGNOSIS — D631 Anemia in chronic kidney disease: Secondary | ICD-10-CM | POA: Diagnosis not present

## 2019-10-09 DIAGNOSIS — D509 Iron deficiency anemia, unspecified: Secondary | ICD-10-CM | POA: Diagnosis not present

## 2019-10-12 DIAGNOSIS — Z992 Dependence on renal dialysis: Secondary | ICD-10-CM | POA: Diagnosis not present

## 2019-10-12 DIAGNOSIS — N2581 Secondary hyperparathyroidism of renal origin: Secondary | ICD-10-CM | POA: Diagnosis not present

## 2019-10-12 DIAGNOSIS — D509 Iron deficiency anemia, unspecified: Secondary | ICD-10-CM | POA: Diagnosis not present

## 2019-10-12 DIAGNOSIS — N186 End stage renal disease: Secondary | ICD-10-CM | POA: Diagnosis not present

## 2019-10-12 DIAGNOSIS — D631 Anemia in chronic kidney disease: Secondary | ICD-10-CM | POA: Diagnosis not present

## 2019-10-14 DIAGNOSIS — Z992 Dependence on renal dialysis: Secondary | ICD-10-CM | POA: Diagnosis not present

## 2019-10-14 DIAGNOSIS — N186 End stage renal disease: Secondary | ICD-10-CM | POA: Diagnosis not present

## 2019-10-14 DIAGNOSIS — N2581 Secondary hyperparathyroidism of renal origin: Secondary | ICD-10-CM | POA: Diagnosis not present

## 2019-10-14 DIAGNOSIS — D509 Iron deficiency anemia, unspecified: Secondary | ICD-10-CM | POA: Diagnosis not present

## 2019-10-16 DIAGNOSIS — N2581 Secondary hyperparathyroidism of renal origin: Secondary | ICD-10-CM | POA: Diagnosis not present

## 2019-10-16 DIAGNOSIS — Z992 Dependence on renal dialysis: Secondary | ICD-10-CM | POA: Diagnosis not present

## 2019-10-16 DIAGNOSIS — N186 End stage renal disease: Secondary | ICD-10-CM | POA: Diagnosis not present

## 2019-10-16 DIAGNOSIS — D509 Iron deficiency anemia, unspecified: Secondary | ICD-10-CM | POA: Diagnosis not present

## 2019-10-19 DIAGNOSIS — Z992 Dependence on renal dialysis: Secondary | ICD-10-CM | POA: Diagnosis not present

## 2019-10-19 DIAGNOSIS — N2581 Secondary hyperparathyroidism of renal origin: Secondary | ICD-10-CM | POA: Diagnosis not present

## 2019-10-19 DIAGNOSIS — D509 Iron deficiency anemia, unspecified: Secondary | ICD-10-CM | POA: Diagnosis not present

## 2019-10-19 DIAGNOSIS — N186 End stage renal disease: Secondary | ICD-10-CM | POA: Diagnosis not present

## 2019-10-21 DIAGNOSIS — D509 Iron deficiency anemia, unspecified: Secondary | ICD-10-CM | POA: Diagnosis not present

## 2019-10-21 DIAGNOSIS — N2581 Secondary hyperparathyroidism of renal origin: Secondary | ICD-10-CM | POA: Diagnosis not present

## 2019-10-21 DIAGNOSIS — N186 End stage renal disease: Secondary | ICD-10-CM | POA: Diagnosis not present

## 2019-10-21 DIAGNOSIS — Z992 Dependence on renal dialysis: Secondary | ICD-10-CM | POA: Diagnosis not present

## 2019-10-23 DIAGNOSIS — Z992 Dependence on renal dialysis: Secondary | ICD-10-CM | POA: Diagnosis not present

## 2019-10-23 DIAGNOSIS — D509 Iron deficiency anemia, unspecified: Secondary | ICD-10-CM | POA: Diagnosis not present

## 2019-10-23 DIAGNOSIS — N186 End stage renal disease: Secondary | ICD-10-CM | POA: Diagnosis not present

## 2019-10-23 DIAGNOSIS — N2581 Secondary hyperparathyroidism of renal origin: Secondary | ICD-10-CM | POA: Diagnosis not present

## 2019-10-26 DIAGNOSIS — D509 Iron deficiency anemia, unspecified: Secondary | ICD-10-CM | POA: Diagnosis not present

## 2019-10-26 DIAGNOSIS — Z992 Dependence on renal dialysis: Secondary | ICD-10-CM | POA: Diagnosis not present

## 2019-10-26 DIAGNOSIS — N186 End stage renal disease: Secondary | ICD-10-CM | POA: Diagnosis not present

## 2019-10-26 DIAGNOSIS — N2581 Secondary hyperparathyroidism of renal origin: Secondary | ICD-10-CM | POA: Diagnosis not present

## 2019-10-28 DIAGNOSIS — N2581 Secondary hyperparathyroidism of renal origin: Secondary | ICD-10-CM | POA: Diagnosis not present

## 2019-10-28 DIAGNOSIS — Z992 Dependence on renal dialysis: Secondary | ICD-10-CM | POA: Diagnosis not present

## 2019-10-28 DIAGNOSIS — N186 End stage renal disease: Secondary | ICD-10-CM | POA: Diagnosis not present

## 2019-10-28 DIAGNOSIS — D509 Iron deficiency anemia, unspecified: Secondary | ICD-10-CM | POA: Diagnosis not present

## 2019-10-30 DIAGNOSIS — N186 End stage renal disease: Secondary | ICD-10-CM | POA: Diagnosis not present

## 2019-10-30 DIAGNOSIS — Z992 Dependence on renal dialysis: Secondary | ICD-10-CM | POA: Diagnosis not present

## 2019-10-30 DIAGNOSIS — N2581 Secondary hyperparathyroidism of renal origin: Secondary | ICD-10-CM | POA: Diagnosis not present

## 2019-10-30 DIAGNOSIS — D509 Iron deficiency anemia, unspecified: Secondary | ICD-10-CM | POA: Diagnosis not present

## 2019-11-02 DIAGNOSIS — N186 End stage renal disease: Secondary | ICD-10-CM | POA: Diagnosis not present

## 2019-11-02 DIAGNOSIS — D509 Iron deficiency anemia, unspecified: Secondary | ICD-10-CM | POA: Diagnosis not present

## 2019-11-02 DIAGNOSIS — N2581 Secondary hyperparathyroidism of renal origin: Secondary | ICD-10-CM | POA: Diagnosis not present

## 2019-11-02 DIAGNOSIS — Z992 Dependence on renal dialysis: Secondary | ICD-10-CM | POA: Diagnosis not present

## 2019-11-02 MED FILL — BIKTARVY 50-200-25 MG TABS: 50-200-25 | 30 days supply | Qty: 30 | Fill #3

## 2019-11-04 DIAGNOSIS — N186 End stage renal disease: Secondary | ICD-10-CM | POA: Diagnosis not present

## 2019-11-04 DIAGNOSIS — Z992 Dependence on renal dialysis: Secondary | ICD-10-CM | POA: Diagnosis not present

## 2019-11-04 DIAGNOSIS — N2581 Secondary hyperparathyroidism of renal origin: Secondary | ICD-10-CM | POA: Diagnosis not present

## 2019-11-04 DIAGNOSIS — D509 Iron deficiency anemia, unspecified: Secondary | ICD-10-CM | POA: Diagnosis not present

## 2019-11-07 DIAGNOSIS — N186 End stage renal disease: Secondary | ICD-10-CM | POA: Diagnosis not present

## 2019-11-07 DIAGNOSIS — D509 Iron deficiency anemia, unspecified: Secondary | ICD-10-CM | POA: Diagnosis not present

## 2019-11-07 DIAGNOSIS — Z992 Dependence on renal dialysis: Secondary | ICD-10-CM | POA: Diagnosis not present

## 2019-11-07 DIAGNOSIS — N2581 Secondary hyperparathyroidism of renal origin: Secondary | ICD-10-CM | POA: Diagnosis not present

## 2019-11-09 DIAGNOSIS — N2581 Secondary hyperparathyroidism of renal origin: Secondary | ICD-10-CM | POA: Diagnosis not present

## 2019-11-09 DIAGNOSIS — D509 Iron deficiency anemia, unspecified: Secondary | ICD-10-CM | POA: Diagnosis not present

## 2019-11-09 DIAGNOSIS — Z992 Dependence on renal dialysis: Secondary | ICD-10-CM | POA: Diagnosis not present

## 2019-11-09 DIAGNOSIS — N186 End stage renal disease: Secondary | ICD-10-CM | POA: Diagnosis not present

## 2019-11-11 DIAGNOSIS — D509 Iron deficiency anemia, unspecified: Secondary | ICD-10-CM | POA: Diagnosis not present

## 2019-11-11 DIAGNOSIS — N186 End stage renal disease: Secondary | ICD-10-CM | POA: Diagnosis not present

## 2019-11-11 DIAGNOSIS — N2581 Secondary hyperparathyroidism of renal origin: Secondary | ICD-10-CM | POA: Diagnosis not present

## 2019-11-11 DIAGNOSIS — Z992 Dependence on renal dialysis: Secondary | ICD-10-CM | POA: Diagnosis not present

## 2019-11-13 DIAGNOSIS — N186 End stage renal disease: Secondary | ICD-10-CM | POA: Diagnosis not present

## 2019-11-13 DIAGNOSIS — Z992 Dependence on renal dialysis: Secondary | ICD-10-CM | POA: Diagnosis not present

## 2019-11-13 DIAGNOSIS — I120 Hypertensive chronic kidney disease with stage 5 chronic kidney disease or end stage renal disease: Secondary | ICD-10-CM | POA: Diagnosis not present

## 2019-11-14 DIAGNOSIS — N2581 Secondary hyperparathyroidism of renal origin: Secondary | ICD-10-CM | POA: Diagnosis not present

## 2019-11-14 DIAGNOSIS — N186 End stage renal disease: Secondary | ICD-10-CM | POA: Diagnosis not present

## 2019-11-14 DIAGNOSIS — Z992 Dependence on renal dialysis: Secondary | ICD-10-CM | POA: Diagnosis not present

## 2019-11-16 DIAGNOSIS — Z992 Dependence on renal dialysis: Secondary | ICD-10-CM | POA: Diagnosis not present

## 2019-11-16 DIAGNOSIS — N2581 Secondary hyperparathyroidism of renal origin: Secondary | ICD-10-CM | POA: Diagnosis not present

## 2019-11-16 DIAGNOSIS — N186 End stage renal disease: Secondary | ICD-10-CM | POA: Diagnosis not present

## 2019-11-18 DIAGNOSIS — N2581 Secondary hyperparathyroidism of renal origin: Secondary | ICD-10-CM | POA: Diagnosis not present

## 2019-11-18 DIAGNOSIS — Z992 Dependence on renal dialysis: Secondary | ICD-10-CM | POA: Diagnosis not present

## 2019-11-18 DIAGNOSIS — N186 End stage renal disease: Secondary | ICD-10-CM | POA: Diagnosis not present

## 2019-11-20 DIAGNOSIS — Z992 Dependence on renal dialysis: Secondary | ICD-10-CM | POA: Diagnosis not present

## 2019-11-20 DIAGNOSIS — N2581 Secondary hyperparathyroidism of renal origin: Secondary | ICD-10-CM | POA: Diagnosis not present

## 2019-11-20 DIAGNOSIS — N186 End stage renal disease: Secondary | ICD-10-CM | POA: Diagnosis not present

## 2019-11-23 DIAGNOSIS — N186 End stage renal disease: Secondary | ICD-10-CM | POA: Diagnosis not present

## 2019-11-23 DIAGNOSIS — Z992 Dependence on renal dialysis: Secondary | ICD-10-CM | POA: Diagnosis not present

## 2019-11-23 DIAGNOSIS — N2581 Secondary hyperparathyroidism of renal origin: Secondary | ICD-10-CM | POA: Diagnosis not present

## 2019-11-25 ENCOUNTER — Other Ambulatory Visit: Payer: Self-pay | Admitting: Infectious Disease

## 2019-11-25 DIAGNOSIS — Z992 Dependence on renal dialysis: Secondary | ICD-10-CM | POA: Diagnosis not present

## 2019-11-25 DIAGNOSIS — N2581 Secondary hyperparathyroidism of renal origin: Secondary | ICD-10-CM | POA: Diagnosis not present

## 2019-11-25 DIAGNOSIS — N186 End stage renal disease: Secondary | ICD-10-CM | POA: Diagnosis not present

## 2019-11-27 DIAGNOSIS — N186 End stage renal disease: Secondary | ICD-10-CM | POA: Diagnosis not present

## 2019-11-27 DIAGNOSIS — N2581 Secondary hyperparathyroidism of renal origin: Secondary | ICD-10-CM | POA: Diagnosis not present

## 2019-11-27 DIAGNOSIS — Z992 Dependence on renal dialysis: Secondary | ICD-10-CM | POA: Diagnosis not present

## 2019-11-30 DIAGNOSIS — Z992 Dependence on renal dialysis: Secondary | ICD-10-CM | POA: Diagnosis not present

## 2019-11-30 DIAGNOSIS — N2581 Secondary hyperparathyroidism of renal origin: Secondary | ICD-10-CM | POA: Diagnosis not present

## 2019-11-30 DIAGNOSIS — N186 End stage renal disease: Secondary | ICD-10-CM | POA: Diagnosis not present

## 2019-12-02 DIAGNOSIS — Z992 Dependence on renal dialysis: Secondary | ICD-10-CM | POA: Diagnosis not present

## 2019-12-02 DIAGNOSIS — N186 End stage renal disease: Secondary | ICD-10-CM | POA: Diagnosis not present

## 2019-12-02 DIAGNOSIS — N2581 Secondary hyperparathyroidism of renal origin: Secondary | ICD-10-CM | POA: Diagnosis not present

## 2019-12-04 DIAGNOSIS — N186 End stage renal disease: Secondary | ICD-10-CM | POA: Diagnosis not present

## 2019-12-04 DIAGNOSIS — Z992 Dependence on renal dialysis: Secondary | ICD-10-CM | POA: Diagnosis not present

## 2019-12-04 DIAGNOSIS — N2581 Secondary hyperparathyroidism of renal origin: Secondary | ICD-10-CM | POA: Diagnosis not present

## 2019-12-04 MED FILL — BIKTARVY 50-200-25 MG TABS: 50-200-25 | 30 days supply | Qty: 30 | Fill #0

## 2019-12-07 DIAGNOSIS — N2581 Secondary hyperparathyroidism of renal origin: Secondary | ICD-10-CM | POA: Diagnosis not present

## 2019-12-07 DIAGNOSIS — Z992 Dependence on renal dialysis: Secondary | ICD-10-CM | POA: Diagnosis not present

## 2019-12-07 DIAGNOSIS — N186 End stage renal disease: Secondary | ICD-10-CM | POA: Diagnosis not present

## 2019-12-09 DIAGNOSIS — N186 End stage renal disease: Secondary | ICD-10-CM | POA: Diagnosis not present

## 2019-12-09 DIAGNOSIS — N2581 Secondary hyperparathyroidism of renal origin: Secondary | ICD-10-CM | POA: Diagnosis not present

## 2019-12-09 DIAGNOSIS — Z992 Dependence on renal dialysis: Secondary | ICD-10-CM | POA: Diagnosis not present

## 2019-12-11 DIAGNOSIS — N2581 Secondary hyperparathyroidism of renal origin: Secondary | ICD-10-CM | POA: Diagnosis not present

## 2019-12-11 DIAGNOSIS — Z992 Dependence on renal dialysis: Secondary | ICD-10-CM | POA: Diagnosis not present

## 2019-12-11 DIAGNOSIS — N186 End stage renal disease: Secondary | ICD-10-CM | POA: Diagnosis not present

## 2019-12-14 DIAGNOSIS — Z992 Dependence on renal dialysis: Secondary | ICD-10-CM | POA: Diagnosis not present

## 2019-12-14 DIAGNOSIS — I120 Hypertensive chronic kidney disease with stage 5 chronic kidney disease or end stage renal disease: Secondary | ICD-10-CM | POA: Diagnosis not present

## 2019-12-14 DIAGNOSIS — N186 End stage renal disease: Secondary | ICD-10-CM | POA: Diagnosis not present

## 2019-12-14 DIAGNOSIS — N2581 Secondary hyperparathyroidism of renal origin: Secondary | ICD-10-CM | POA: Diagnosis not present

## 2019-12-16 DIAGNOSIS — N2581 Secondary hyperparathyroidism of renal origin: Secondary | ICD-10-CM | POA: Diagnosis not present

## 2019-12-16 DIAGNOSIS — N186 End stage renal disease: Secondary | ICD-10-CM | POA: Diagnosis not present

## 2019-12-16 DIAGNOSIS — Z992 Dependence on renal dialysis: Secondary | ICD-10-CM | POA: Diagnosis not present

## 2019-12-18 DIAGNOSIS — N2581 Secondary hyperparathyroidism of renal origin: Secondary | ICD-10-CM | POA: Diagnosis not present

## 2019-12-18 DIAGNOSIS — Z992 Dependence on renal dialysis: Secondary | ICD-10-CM | POA: Diagnosis not present

## 2019-12-18 DIAGNOSIS — N186 End stage renal disease: Secondary | ICD-10-CM | POA: Diagnosis not present

## 2019-12-21 DIAGNOSIS — N186 End stage renal disease: Secondary | ICD-10-CM | POA: Diagnosis not present

## 2019-12-21 DIAGNOSIS — Z992 Dependence on renal dialysis: Secondary | ICD-10-CM | POA: Diagnosis not present

## 2019-12-21 DIAGNOSIS — N2581 Secondary hyperparathyroidism of renal origin: Secondary | ICD-10-CM | POA: Diagnosis not present

## 2019-12-23 DIAGNOSIS — N2581 Secondary hyperparathyroidism of renal origin: Secondary | ICD-10-CM | POA: Diagnosis not present

## 2019-12-23 DIAGNOSIS — Z992 Dependence on renal dialysis: Secondary | ICD-10-CM | POA: Diagnosis not present

## 2019-12-23 DIAGNOSIS — N186 End stage renal disease: Secondary | ICD-10-CM | POA: Diagnosis not present

## 2019-12-25 DIAGNOSIS — Z992 Dependence on renal dialysis: Secondary | ICD-10-CM | POA: Diagnosis not present

## 2019-12-25 DIAGNOSIS — N186 End stage renal disease: Secondary | ICD-10-CM | POA: Diagnosis not present

## 2019-12-25 DIAGNOSIS — N2581 Secondary hyperparathyroidism of renal origin: Secondary | ICD-10-CM | POA: Diagnosis not present

## 2019-12-28 DIAGNOSIS — Z992 Dependence on renal dialysis: Secondary | ICD-10-CM | POA: Diagnosis not present

## 2019-12-28 DIAGNOSIS — N2581 Secondary hyperparathyroidism of renal origin: Secondary | ICD-10-CM | POA: Diagnosis not present

## 2019-12-28 DIAGNOSIS — N186 End stage renal disease: Secondary | ICD-10-CM | POA: Diagnosis not present

## 2019-12-28 NOTE — Addendum Note (Signed)
Addended by: Dolan Amen D on: 12/28/2019 04:52 PM   Modules accepted: Orders

## 2019-12-29 ENCOUNTER — Other Ambulatory Visit: Payer: Self-pay

## 2019-12-29 ENCOUNTER — Other Ambulatory Visit: Payer: Medicare Other

## 2019-12-29 DIAGNOSIS — B20 Human immunodeficiency virus [HIV] disease: Secondary | ICD-10-CM | POA: Diagnosis not present

## 2019-12-29 DIAGNOSIS — I10 Essential (primary) hypertension: Secondary | ICD-10-CM

## 2019-12-29 DIAGNOSIS — N186 End stage renal disease: Secondary | ICD-10-CM

## 2019-12-29 DIAGNOSIS — Z992 Dependence on renal dialysis: Secondary | ICD-10-CM | POA: Diagnosis not present

## 2019-12-29 DIAGNOSIS — Z79899 Other long term (current) drug therapy: Secondary | ICD-10-CM | POA: Diagnosis not present

## 2019-12-30 DIAGNOSIS — Z992 Dependence on renal dialysis: Secondary | ICD-10-CM | POA: Diagnosis not present

## 2019-12-30 DIAGNOSIS — N2581 Secondary hyperparathyroidism of renal origin: Secondary | ICD-10-CM | POA: Diagnosis not present

## 2019-12-30 DIAGNOSIS — N186 End stage renal disease: Secondary | ICD-10-CM | POA: Diagnosis not present

## 2019-12-30 LAB — T-HELPER CELL (CD4) - (RCID CLINIC ONLY)
CD4 % Helper T Cell: 28 % — ABNORMAL LOW (ref 33–65)
CD4 T Cell Abs: 654 /uL (ref 400–1790)

## 2020-01-01 DIAGNOSIS — Z992 Dependence on renal dialysis: Secondary | ICD-10-CM | POA: Diagnosis not present

## 2020-01-01 DIAGNOSIS — N186 End stage renal disease: Secondary | ICD-10-CM | POA: Diagnosis not present

## 2020-01-01 DIAGNOSIS — N2581 Secondary hyperparathyroidism of renal origin: Secondary | ICD-10-CM | POA: Diagnosis not present

## 2020-01-01 LAB — CBC WITH DIFFERENTIAL/PLATELET
Absolute Monocytes: 562 cells/uL (ref 200–950)
Basophils Absolute: 61 cells/uL (ref 0–200)
Basophils Relative: 0.8 %
Eosinophils Absolute: 213 cells/uL (ref 15–500)
Eosinophils Relative: 2.8 %
HCT: 37.2 % (ref 35.0–45.0)
Hemoglobin: 12.8 g/dL (ref 11.7–15.5)
Lymphs Abs: 2356 cells/uL (ref 850–3900)
MCH: 33.9 pg — ABNORMAL HIGH (ref 27.0–33.0)
MCHC: 34.4 g/dL (ref 32.0–36.0)
MCV: 98.4 fL (ref 80.0–100.0)
MPV: 9.9 fL (ref 7.5–12.5)
Monocytes Relative: 7.4 %
Neutro Abs: 4408 cells/uL (ref 1500–7800)
Neutrophils Relative %: 58 %
Platelets: 214 10*3/uL (ref 140–400)
RBC: 3.78 10*6/uL — ABNORMAL LOW (ref 3.80–5.10)
RDW: 13.4 % (ref 11.0–15.0)
Total Lymphocyte: 31 %
WBC: 7.6 10*3/uL (ref 3.8–10.8)

## 2020-01-01 LAB — COMPLETE METABOLIC PANEL WITH GFR
AG Ratio: 1.5 (calc) (ref 1.0–2.5)
ALT: 9 U/L (ref 6–29)
AST: 14 U/L (ref 10–35)
Albumin: 4 g/dL (ref 3.6–5.1)
Alkaline phosphatase (APISO): 50 U/L (ref 31–125)
BUN/Creatinine Ratio: 4 (calc) — ABNORMAL LOW (ref 6–22)
BUN: 31 mg/dL — ABNORMAL HIGH (ref 7–25)
CO2: 32 mmol/L (ref 20–32)
Calcium: 10.6 mg/dL — ABNORMAL HIGH (ref 8.6–10.2)
Chloride: 97 mmol/L — ABNORMAL LOW (ref 98–110)
Creat: 7.83 mg/dL — ABNORMAL HIGH (ref 0.50–1.10)
GFR, Est African American: 7 mL/min/{1.73_m2} — ABNORMAL LOW (ref >=60)
GFR, Est Non African American: 6 mL/min/{1.73_m2} — ABNORMAL LOW (ref >=60)
Globulin: 2.6 g/dL (calc) (ref 1.9–3.7)
Glucose, Bld: 80 mg/dL (ref 65–99)
Potassium: 5.4 mmol/L — ABNORMAL HIGH (ref 3.5–5.3)
Sodium: 138 mmol/L (ref 135–146)
Total Bilirubin: 0.4 mg/dL (ref 0.2–1.2)
Total Protein: 6.6 g/dL (ref 6.1–8.1)

## 2020-01-01 LAB — HIV-1 RNA QUANT-NO REFLEX-BLD
HIV 1 RNA Quant: <20 copies/mL
HIV-1 RNA Quant, Log: <1.3 Log copies/mL

## 2020-01-01 LAB — LIPID PANEL
Cholesterol: 200 mg/dL — ABNORMAL HIGH (ref <200)
HDL: 46 mg/dL — ABNORMAL LOW (ref >=50)
LDL Cholesterol (Calc): 134 mg/dL (calc) — ABNORMAL HIGH
Non-HDL Cholesterol (Calc): 154 mg/dL (calc) — ABNORMAL HIGH (ref <130)
Total CHOL/HDL Ratio: 4.3 (calc) (ref <5.0)
Triglycerides: 92 mg/dL (ref <150)

## 2020-01-01 LAB — RPR: RPR Ser Ql: NONREACTIVE

## 2020-01-04 DIAGNOSIS — N2581 Secondary hyperparathyroidism of renal origin: Secondary | ICD-10-CM | POA: Diagnosis not present

## 2020-01-04 DIAGNOSIS — Z992 Dependence on renal dialysis: Secondary | ICD-10-CM | POA: Diagnosis not present

## 2020-01-04 DIAGNOSIS — N186 End stage renal disease: Secondary | ICD-10-CM | POA: Diagnosis not present

## 2020-01-06 DIAGNOSIS — Z992 Dependence on renal dialysis: Secondary | ICD-10-CM | POA: Diagnosis not present

## 2020-01-06 DIAGNOSIS — N2581 Secondary hyperparathyroidism of renal origin: Secondary | ICD-10-CM | POA: Diagnosis not present

## 2020-01-06 DIAGNOSIS — N186 End stage renal disease: Secondary | ICD-10-CM | POA: Diagnosis not present

## 2020-01-06 MED FILL — BIKTARVY 50-200-25 MG TABS: 50-200-25 | 30 days supply | Qty: 30 | Fill #1

## 2020-01-08 DIAGNOSIS — Z992 Dependence on renal dialysis: Secondary | ICD-10-CM | POA: Diagnosis not present

## 2020-01-08 DIAGNOSIS — N186 End stage renal disease: Secondary | ICD-10-CM | POA: Diagnosis not present

## 2020-01-08 DIAGNOSIS — N2581 Secondary hyperparathyroidism of renal origin: Secondary | ICD-10-CM | POA: Diagnosis not present

## 2020-01-11 ENCOUNTER — Telehealth: Payer: Self-pay

## 2020-01-11 DIAGNOSIS — B20 Human immunodeficiency virus [HIV] disease: Secondary | ICD-10-CM | POA: Diagnosis not present

## 2020-01-11 DIAGNOSIS — Z992 Dependence on renal dialysis: Secondary | ICD-10-CM | POA: Diagnosis not present

## 2020-01-11 DIAGNOSIS — I10 Essential (primary) hypertension: Secondary | ICD-10-CM | POA: Diagnosis not present

## 2020-01-11 DIAGNOSIS — N186 End stage renal disease: Secondary | ICD-10-CM | POA: Diagnosis not present

## 2020-01-11 DIAGNOSIS — N2581 Secondary hyperparathyroidism of renal origin: Secondary | ICD-10-CM | POA: Diagnosis not present

## 2020-01-11 DIAGNOSIS — F321 Major depressive disorder, single episode, moderate: Secondary | ICD-10-CM | POA: Diagnosis not present

## 2020-01-11 DIAGNOSIS — I120 Hypertensive chronic kidney disease with stage 5 chronic kidney disease or end stage renal disease: Secondary | ICD-10-CM | POA: Diagnosis not present

## 2020-01-11 NOTE — Telephone Encounter (Signed)
COVID-19 Pre-Screening Questions:01/11/20  Do you currently have a fever (>100 F), chills or unexplained body aches? *NO  . Are you currently experiencing new cough, shortness of breath, sore throat, runny nose?NO .  Have you recently travelled outside the state of [place] in the last 14 days?NO .  Have you been in contact with someone that is currently pending confirmation of Covid19 testing or has been confirmed to have the [place] virus?  NO   **If the patient answers NO to ALL questions -  advise the patient to please call the clinic before coming to the office should any symptoms develop.

## 2020-01-12 ENCOUNTER — Other Ambulatory Visit: Payer: Self-pay

## 2020-01-12 ENCOUNTER — Encounter: Payer: Self-pay | Admitting: Infectious Disease

## 2020-01-12 ENCOUNTER — Other Ambulatory Visit: Payer: Self-pay | Admitting: Infectious Disease

## 2020-01-12 ENCOUNTER — Ambulatory Visit (INDEPENDENT_AMBULATORY_CARE_PROVIDER_SITE_OTHER): Payer: Medicare Other | Admitting: Infectious Disease

## 2020-01-12 VITALS — BP 128/85 | HR 79 | Wt 201.0 lb

## 2020-01-12 DIAGNOSIS — I10 Essential (primary) hypertension: Secondary | ICD-10-CM | POA: Diagnosis not present

## 2020-01-12 DIAGNOSIS — N186 End stage renal disease: Secondary | ICD-10-CM

## 2020-01-12 DIAGNOSIS — Z992 Dependence on renal dialysis: Secondary | ICD-10-CM | POA: Diagnosis not present

## 2020-01-12 DIAGNOSIS — F321 Major depressive disorder, single episode, moderate: Secondary | ICD-10-CM | POA: Diagnosis not present

## 2020-01-12 DIAGNOSIS — B20 Human immunodeficiency virus [HIV] disease: Secondary | ICD-10-CM | POA: Diagnosis not present

## 2020-01-12 MED ORDER — BIKTARVY 50-200-25 MG PO TABS: 1.0000 | ORAL_TABLET | Freq: Every day | ORAL | 11 refills | Status: DC

## 2020-01-12 NOTE — Progress Notes (Signed)
Subjective:   Chief complaint: Follow-up for her HIV    Patient ID: Tiffany Golden, female    DOB: Oct 10, 1976, 44 y.o.   MRN: [id number]  HPI 44  y.o. female past medical history significant for HIV and AIDS that was diagnosed approximately 20 years ago.   She has been on various antiretroviral regimens and has had illogical failure with resistance.  She has maintained virological suppression on Biktarvy which has 2 active drugs.  She has diabetes mellitus with chronic kidney disease and in fact end-stage renal disease on hemodialysis and is on the transplant list at [facility].      Past Medical History:  Diagnosis Date  . Anemia   . Chronic kidney disease   . Complication of anesthesia   . Dialysis patient [facility])    mon, wed 64  . ESRD (end stage renal disease) ([place])   . HIV infection ([place])   . Hypertension   . PONV (postoperative nausea and vomiting)   . Stroke ([place])   . TIA (transient ischemic attack)    Hx:of    Past Surgical History:  Procedure Laterality Date  . ARTERIOVENOUS GRAFT PLACEMENT  09/11/11   left arm  . CESAREAN SECTION    . DILITATION & CURRETTAGE/HYSTROSCOPY WITH NOVASURE ABLATION N/A 05/03/2015   Procedure: DILATATION/HYSTEROSCOPY WITH NOVASURE ABLATION; uterine cavity length 5.0 cm, uterine cavity width 3.8 cm, power 105 watts; time 1 minute 12 seconds;  Surgeon: Jonnie Kind, MD;  [facility];  Service: Gynecology;  Laterality: N/A;  . INSERTION OF DIALYSIS CATHETER Right 09/01/2013   Procedure: INSERTION OF DIALYSIS CATHETER Right Internal Jugular;  Surgeon: Elam Dutch, MD;  Location: [place];  Service: Vascular;  Laterality: Right;  . PATCH ANGIOPLASTY Left 09/01/2013   Procedure: PATCH ANGIOPLASTY;  Surgeon: Elam Dutch, MD;  Location: [place];  Service: Vascular;  Laterality: Left;  . REVISON OF ARTERIOVENOUS FISTULA Left 123456   Procedure: Plication left arm fistula;  Surgeon: Elam Dutch, MD;  Location: [facility];   Service: Vascular;  Laterality: Left;    Family History  Problem Relation Age of Onset  . Hyperlipidemia Mother   . Cancer - Other Mother        Hx partial hysterectomy  . Heart disease Father        Hx CABG      Social History   Socioeconomic History  . Marital status: Single    Spouse name: Not on file  . Number of children: Not on file  . Years of education: Not on file  . Highest education level: Not on file  Occupational History  . Not on file  Tobacco Use  . Smoking status: Former Smoker    Packs/day: 0.50    Years: 3.00    Pack years: 1.50    Types: Cigarettes    Quit date: 09/05/2009    Years since quitting: 10.3  . Smokeless tobacco: Never Used  Substance and Sexual Activity  . Alcohol use: No  . Drug use: No  . Sexual activity: Not Currently    Partners: Male    Comment: declined condoms  Other Topics Concern  . Not on file  Social History Narrative  . Not on file   Social Determinants of Health   Financial Resource Strain:   . Difficulty of Paying Living Expenses: Not on file  Food Insecurity:   . Worried About Charity fundraiser in the Last Year: Not on file  .  Ran Out of Food in the Last Year: Not on file  Transportation Needs:   . Lack of Transportation (Medical): Not on file  . Lack of Transportation (Non-Medical): Not on file  Physical Activity:   . Days of Exercise per Week: Not on file  . Minutes of Exercise per Session: Not on file  Stress:   . Feeling of Stress : Not on file  Social Connections:   . Frequency of Communication with Friends and Family: Not on file  . Frequency of Social Gatherings with Friends and Family: Not on file  . Attends Religious Services: Not on file  . Active Member of Clubs or Organizations: Not on file  . Attends Archivist Meetings: Not on file  . Marital Status: Not on file    Allergies  Allergen Reactions  . Fortaz [Ceftazidime Sodium In D5w] Rash    Head-toe  . Sulfa Antibiotics Hives   . Vancomycin Rash    Head-toe     Current Outpatient Medications:  .  BIKTARVY 50-200-25 MG TABS tablet, TAKE 1 TABLET BY MOUTH DAILY., Disp: 30 tablet, Rfl: 3 .  B Complex-C-Folic Acid (RENA-VITE RX) 1 MG TABS, Take 1 tablet by mouth daily., Disp: , Rfl:  .  calcium carbonate (TUMS - DOSED IN MG ELEMENTAL CALCIUM) 500 MG chewable tablet, Chew 1-2 tablets by mouth daily as needed for indigestion or heartburn., Disp: , Rfl:  .  metoprolol tartrate (LOPRESSOR) 25 MG tablet, Take 12.5 mg by mouth 2 (two) times daily as needed (for blood pressure). Except takes none on dialysis days (Tuesday, Thursday, and Sunday)., Disp: , Rfl:  .  sertraline (ZOLOFT) 100 MG tablet, Take 1 tablet (100 mg total) by mouth daily., Disp: 30 tablet, Rfl: 5    Review of Systems  Constitutional: Negative for activity change, appetite change, chills, diaphoresis, fatigue, fever and unexpected weight change.  HENT: Negative for congestion, rhinorrhea, sinus pressure, sneezing, sore throat and trouble swallowing.   Eyes: Negative for photophobia and visual disturbance.  Respiratory: Negative for cough, chest tightness, shortness of breath, wheezing and stridor.   Cardiovascular: Negative for chest pain, palpitations and leg swelling.  Gastrointestinal: Negative for abdominal distention, abdominal pain, anal bleeding, blood in stool, constipation, diarrhea, nausea and vomiting.  Genitourinary: Negative for decreased urine volume.  Musculoskeletal: Negative for arthralgias, back pain, gait problem, joint swelling and myalgias.  Skin: Negative for color change, pallor, rash and wound.  Neurological: Negative for dizziness, tremors, weakness and light-headedness.  Hematological: Negative for adenopathy. Does not bruise/bleed easily.  Psychiatric/Behavioral: Negative for agitation, behavioral problems, confusion, decreased concentration, self-injury, sleep disturbance and suicidal ideas.       Objective:   Physical  Exam  Constitutional: She is oriented to person, place, and time. She appears well-developed and well-nourished. No distress.  HENT:  Head: Normocephalic and atraumatic.  Mouth/Throat: No oropharyngeal exudate.  Eyes: Conjunctivae and EOM are normal. No scleral icterus.  Cardiovascular: Normal rate and regular rhythm.  Pulmonary/Chest: Effort normal. No respiratory distress. She has no wheezes.  Abdominal: She exhibits no distension.  Musculoskeletal:        General: No tenderness, deformity or edema.     Cervical back: Normal range of motion and neck supple.  Neurological: She is alert and oriented to person, place, and time. Coordination normal.  Skin: Skin is warm and dry. No rash noted. She is not diaphoretic. No erythema. No pallor.  Psychiatric: She has a normal mood and affect. Her behavior is normal.  Judgment and thought content normal. She does not exhibit a depressed mood.          Assessment & Plan:  HIV and AIDS:  Continue Biktarvy alone.   regimen that has only 2 fully active drugs though the N184V does sensitize her virus to the tenofovir.  -When she gets the transplant I still think this is probably the best regimen for her to be on.  She tolerates it quite well and I am not terribly concerned about TAF being dangerous to her transplant kidney if she gets one though if there is concern about that we can consider a different regimen.  She had trouble tolerating other antiretrovirals though and I would worry about adherence dropping off.  She does not have NNRTI resistance to recovery in [place] might be one option we can try but I would be very careful about this.  She is not interested in long-acting injectable antiviral   Hypertension : BP  much better controlled Vitals:   01/12/20 0929  BP: 128/85  Pulse: 79     ESRD on HD: --continue HD .  She is on the transplant list again.  Depression on SSRI

## 2020-01-13 DIAGNOSIS — N186 End stage renal disease: Secondary | ICD-10-CM | POA: Diagnosis not present

## 2020-01-13 DIAGNOSIS — N2581 Secondary hyperparathyroidism of renal origin: Secondary | ICD-10-CM | POA: Diagnosis not present

## 2020-01-13 DIAGNOSIS — Z992 Dependence on renal dialysis: Secondary | ICD-10-CM | POA: Diagnosis not present

## 2020-01-15 DIAGNOSIS — Z992 Dependence on renal dialysis: Secondary | ICD-10-CM | POA: Diagnosis not present

## 2020-01-15 DIAGNOSIS — N2581 Secondary hyperparathyroidism of renal origin: Secondary | ICD-10-CM | POA: Diagnosis not present

## 2020-01-15 DIAGNOSIS — N186 End stage renal disease: Secondary | ICD-10-CM | POA: Diagnosis not present

## 2020-01-18 DIAGNOSIS — Z992 Dependence on renal dialysis: Secondary | ICD-10-CM | POA: Diagnosis not present

## 2020-01-18 DIAGNOSIS — N186 End stage renal disease: Secondary | ICD-10-CM | POA: Diagnosis not present

## 2020-01-18 DIAGNOSIS — N2581 Secondary hyperparathyroidism of renal origin: Secondary | ICD-10-CM | POA: Diagnosis not present

## 2020-01-20 DIAGNOSIS — Z992 Dependence on renal dialysis: Secondary | ICD-10-CM | POA: Diagnosis not present

## 2020-01-20 DIAGNOSIS — N186 End stage renal disease: Secondary | ICD-10-CM | POA: Diagnosis not present

## 2020-01-20 DIAGNOSIS — N2581 Secondary hyperparathyroidism of renal origin: Secondary | ICD-10-CM | POA: Diagnosis not present

## 2020-01-22 DIAGNOSIS — Z992 Dependence on renal dialysis: Secondary | ICD-10-CM | POA: Diagnosis not present

## 2020-01-22 DIAGNOSIS — N2581 Secondary hyperparathyroidism of renal origin: Secondary | ICD-10-CM | POA: Diagnosis not present

## 2020-01-22 DIAGNOSIS — N186 End stage renal disease: Secondary | ICD-10-CM | POA: Diagnosis not present

## 2020-01-25 DIAGNOSIS — Z992 Dependence on renal dialysis: Secondary | ICD-10-CM | POA: Diagnosis not present

## 2020-01-25 DIAGNOSIS — N2581 Secondary hyperparathyroidism of renal origin: Secondary | ICD-10-CM | POA: Diagnosis not present

## 2020-01-25 DIAGNOSIS — N186 End stage renal disease: Secondary | ICD-10-CM | POA: Diagnosis not present

## 2020-01-27 DIAGNOSIS — N186 End stage renal disease: Secondary | ICD-10-CM | POA: Diagnosis not present

## 2020-01-27 DIAGNOSIS — N2581 Secondary hyperparathyroidism of renal origin: Secondary | ICD-10-CM | POA: Diagnosis not present

## 2020-01-27 DIAGNOSIS — Z992 Dependence on renal dialysis: Secondary | ICD-10-CM | POA: Diagnosis not present

## 2020-01-29 DIAGNOSIS — N186 End stage renal disease: Secondary | ICD-10-CM | POA: Diagnosis not present

## 2020-01-29 DIAGNOSIS — Z992 Dependence on renal dialysis: Secondary | ICD-10-CM | POA: Diagnosis not present

## 2020-01-29 DIAGNOSIS — N2581 Secondary hyperparathyroidism of renal origin: Secondary | ICD-10-CM | POA: Diagnosis not present

## 2020-02-01 DIAGNOSIS — N2581 Secondary hyperparathyroidism of renal origin: Secondary | ICD-10-CM | POA: Diagnosis not present

## 2020-02-01 DIAGNOSIS — Z992 Dependence on renal dialysis: Secondary | ICD-10-CM | POA: Diagnosis not present

## 2020-02-01 DIAGNOSIS — N186 End stage renal disease: Secondary | ICD-10-CM | POA: Diagnosis not present

## 2020-02-02 ENCOUNTER — Ambulatory Visit: Payer: Medicare Other | Admitting: Family Medicine

## 2020-02-03 DIAGNOSIS — N186 End stage renal disease: Secondary | ICD-10-CM | POA: Diagnosis not present

## 2020-02-03 DIAGNOSIS — N2581 Secondary hyperparathyroidism of renal origin: Secondary | ICD-10-CM | POA: Diagnosis not present

## 2020-02-03 DIAGNOSIS — Z992 Dependence on renal dialysis: Secondary | ICD-10-CM | POA: Diagnosis not present

## 2020-02-05 DIAGNOSIS — N2581 Secondary hyperparathyroidism of renal origin: Secondary | ICD-10-CM | POA: Diagnosis not present

## 2020-02-05 DIAGNOSIS — N186 End stage renal disease: Secondary | ICD-10-CM | POA: Diagnosis not present

## 2020-02-05 DIAGNOSIS — Z992 Dependence on renal dialysis: Secondary | ICD-10-CM | POA: Diagnosis not present

## 2020-02-08 DIAGNOSIS — Z992 Dependence on renal dialysis: Secondary | ICD-10-CM | POA: Diagnosis not present

## 2020-02-08 DIAGNOSIS — N186 End stage renal disease: Secondary | ICD-10-CM | POA: Diagnosis not present

## 2020-02-08 DIAGNOSIS — N2581 Secondary hyperparathyroidism of renal origin: Secondary | ICD-10-CM | POA: Diagnosis not present

## 2020-02-10 DIAGNOSIS — Z992 Dependence on renal dialysis: Secondary | ICD-10-CM | POA: Diagnosis not present

## 2020-02-10 DIAGNOSIS — N186 End stage renal disease: Secondary | ICD-10-CM | POA: Diagnosis not present

## 2020-02-10 DIAGNOSIS — N2581 Secondary hyperparathyroidism of renal origin: Secondary | ICD-10-CM | POA: Diagnosis not present

## 2020-02-10 MED FILL — BIKTARVY 50-200-25 MG TABS: 50-200-25 | 30 days supply | Qty: 30 | Fill #2

## 2020-02-11 DIAGNOSIS — Z992 Dependence on renal dialysis: Secondary | ICD-10-CM | POA: Diagnosis not present

## 2020-02-11 DIAGNOSIS — I120 Hypertensive chronic kidney disease with stage 5 chronic kidney disease or end stage renal disease: Secondary | ICD-10-CM | POA: Diagnosis not present

## 2020-02-11 DIAGNOSIS — N186 End stage renal disease: Secondary | ICD-10-CM | POA: Diagnosis not present

## 2020-02-12 DIAGNOSIS — N2581 Secondary hyperparathyroidism of renal origin: Secondary | ICD-10-CM | POA: Diagnosis not present

## 2020-02-12 DIAGNOSIS — Z992 Dependence on renal dialysis: Secondary | ICD-10-CM | POA: Diagnosis not present

## 2020-02-12 DIAGNOSIS — N186 End stage renal disease: Secondary | ICD-10-CM | POA: Diagnosis not present

## 2020-02-15 DIAGNOSIS — N2581 Secondary hyperparathyroidism of renal origin: Secondary | ICD-10-CM | POA: Diagnosis not present

## 2020-02-15 DIAGNOSIS — N186 End stage renal disease: Secondary | ICD-10-CM | POA: Diagnosis not present

## 2020-02-15 DIAGNOSIS — Z992 Dependence on renal dialysis: Secondary | ICD-10-CM | POA: Diagnosis not present

## 2020-02-17 DIAGNOSIS — Z992 Dependence on renal dialysis: Secondary | ICD-10-CM | POA: Diagnosis not present

## 2020-02-17 DIAGNOSIS — N2581 Secondary hyperparathyroidism of renal origin: Secondary | ICD-10-CM | POA: Diagnosis not present

## 2020-02-17 DIAGNOSIS — N186 End stage renal disease: Secondary | ICD-10-CM | POA: Diagnosis not present

## 2020-02-19 DIAGNOSIS — Z992 Dependence on renal dialysis: Secondary | ICD-10-CM | POA: Diagnosis not present

## 2020-02-19 DIAGNOSIS — N2581 Secondary hyperparathyroidism of renal origin: Secondary | ICD-10-CM | POA: Diagnosis not present

## 2020-02-19 DIAGNOSIS — N186 End stage renal disease: Secondary | ICD-10-CM | POA: Diagnosis not present

## 2020-02-22 DIAGNOSIS — N2581 Secondary hyperparathyroidism of renal origin: Secondary | ICD-10-CM | POA: Diagnosis not present

## 2020-02-22 DIAGNOSIS — Z992 Dependence on renal dialysis: Secondary | ICD-10-CM | POA: Diagnosis not present

## 2020-02-22 DIAGNOSIS — N186 End stage renal disease: Secondary | ICD-10-CM | POA: Diagnosis not present

## 2020-02-24 DIAGNOSIS — N2581 Secondary hyperparathyroidism of renal origin: Secondary | ICD-10-CM | POA: Diagnosis not present

## 2020-02-24 DIAGNOSIS — Z992 Dependence on renal dialysis: Secondary | ICD-10-CM | POA: Diagnosis not present

## 2020-02-24 DIAGNOSIS — N186 End stage renal disease: Secondary | ICD-10-CM | POA: Diagnosis not present

## 2020-02-25 ENCOUNTER — Ambulatory Visit (INDEPENDENT_AMBULATORY_CARE_PROVIDER_SITE_OTHER): Payer: Medicare Other | Admitting: Family Medicine

## 2020-02-25 ENCOUNTER — Encounter: Payer: Self-pay | Admitting: Family Medicine

## 2020-02-25 ENCOUNTER — Other Ambulatory Visit: Payer: Self-pay

## 2020-02-25 VITALS — BP 116/74 | Temp 98.1°F | Ht 63.5 in | Wt 201.0 lb

## 2020-02-25 DIAGNOSIS — Z0000 Encounter for general adult medical examination without abnormal findings: Secondary | ICD-10-CM | POA: Diagnosis not present

## 2020-02-25 MED ORDER — SERTRALINE HCL 100 MG PO TABS: 100.0000 mg | ORAL_TABLET | Freq: Every day | ORAL | 5 refills | Status: DC

## 2020-02-25 NOTE — Progress Notes (Signed)
   Subjective:    Patient ID: Tiffany Golden, female    DOB: 1976-02-04, 44 y.o.   MRN: [id number]  HPI   The patient comes in today for a wellness visit.  Patient is undergoing dialysis She is also being treated for HIV Overall she is doing really well Her depression is doing well taking her medications as directed under good control she would like to continue the medicine She is feeling a moderate amount of stress with the social issues that are going on currently I certainly understand her concerns A review of their health history was completed.  A review of medications was also completed.  Any needed refills; Sertraline  Eating habits: Pretty good  Falls/  MVA accidents in past few months: none  Regular exercise: walking, at home work outs  Specialist pt sees on regular basis: Kidney  Preventative health issues were discussed.   Additional concerns: discuss recent blood work - high cholesterol  Defers exam. Review of Systems  Constitutional: Negative for activity change, appetite change and fatigue.  HENT: Negative for congestion and rhinorrhea.   Respiratory: Negative for cough and shortness of breath.   Cardiovascular: Negative for chest pain and leg swelling.  Gastrointestinal: Negative for abdominal pain and diarrhea.  Endocrine: Negative for polydipsia and polyphagia.  Skin: Negative for color change.  Neurological: Negative for dizziness and weakness.  Psychiatric/Behavioral: Negative for behavioral problems and confusion.       Objective:   Physical Exam Vitals reviewed.  Constitutional:      General: She is not in acute distress. HENT:     Head: Normocephalic and atraumatic.  Eyes:     General:        Right eye: No discharge.        Left eye: No discharge.  Neck:     Trachea: No tracheal deviation.  Cardiovascular:     Rate and Rhythm: Normal rate and regular rhythm.     Heart sounds: Normal heart sounds. No murmur.  Pulmonary:     Effort:  Pulmonary effort is normal. No respiratory distress.     Breath sounds: Normal breath sounds.  Lymphadenopathy:     Cervical: No cervical adenopathy.  Skin:    General: Skin is warm and dry.  Neurological:     Mental Status: She is alert.     Coordination: Coordination normal.  Psychiatric:        Behavior: Behavior normal.           Assessment & Plan:  Very nice patient Doing overall well with her given issues Kidneys are stable undergoing dialysis HIV being well taking care of Adult wellness-complete.wellness physical was conducted today. Importance of diet and exercise were discussed in detail.  In addition to this a discussion regarding safety was also covered. We also reviewed over immunizations and gave recommendations regarding current immunization needed for age.  In addition to this additional areas were also touched on including: Preventative health exams needed:  Colonoscopy not indicated by insurance guidelines but I am open to her getting a colonoscopy but it is just not covered until age 42  Mammogram recommended.  Patient does have some sleep issues I recommend behavioral approach to that we will mail her some information Depression overall under good control continue sertraline follow-up in 6 months Cholesterol profile does show elevated LDL but her HDL overall is good no need for medications at this point Pap smear next year Patient was advised yearly wellness exam

## 2020-02-26 DIAGNOSIS — N186 End stage renal disease: Secondary | ICD-10-CM | POA: Diagnosis not present

## 2020-02-26 DIAGNOSIS — Z992 Dependence on renal dialysis: Secondary | ICD-10-CM | POA: Diagnosis not present

## 2020-02-26 DIAGNOSIS — N2581 Secondary hyperparathyroidism of renal origin: Secondary | ICD-10-CM | POA: Diagnosis not present

## 2020-02-29 DIAGNOSIS — N2581 Secondary hyperparathyroidism of renal origin: Secondary | ICD-10-CM | POA: Diagnosis not present

## 2020-02-29 DIAGNOSIS — N186 End stage renal disease: Secondary | ICD-10-CM | POA: Diagnosis not present

## 2020-02-29 DIAGNOSIS — Z992 Dependence on renal dialysis: Secondary | ICD-10-CM | POA: Diagnosis not present

## 2020-03-02 DIAGNOSIS — Z992 Dependence on renal dialysis: Secondary | ICD-10-CM | POA: Diagnosis not present

## 2020-03-02 DIAGNOSIS — N186 End stage renal disease: Secondary | ICD-10-CM | POA: Diagnosis not present

## 2020-03-02 DIAGNOSIS — N2581 Secondary hyperparathyroidism of renal origin: Secondary | ICD-10-CM | POA: Diagnosis not present

## 2020-03-04 DIAGNOSIS — N186 End stage renal disease: Secondary | ICD-10-CM | POA: Diagnosis not present

## 2020-03-04 DIAGNOSIS — Z992 Dependence on renal dialysis: Secondary | ICD-10-CM | POA: Diagnosis not present

## 2020-03-04 DIAGNOSIS — N2581 Secondary hyperparathyroidism of renal origin: Secondary | ICD-10-CM | POA: Diagnosis not present

## 2020-03-07 DIAGNOSIS — N2581 Secondary hyperparathyroidism of renal origin: Secondary | ICD-10-CM | POA: Diagnosis not present

## 2020-03-07 DIAGNOSIS — Z992 Dependence on renal dialysis: Secondary | ICD-10-CM | POA: Diagnosis not present

## 2020-03-07 DIAGNOSIS — N186 End stage renal disease: Secondary | ICD-10-CM | POA: Diagnosis not present

## 2020-03-09 DIAGNOSIS — Z992 Dependence on renal dialysis: Secondary | ICD-10-CM | POA: Diagnosis not present

## 2020-03-09 DIAGNOSIS — N186 End stage renal disease: Secondary | ICD-10-CM | POA: Diagnosis not present

## 2020-03-09 DIAGNOSIS — N2581 Secondary hyperparathyroidism of renal origin: Secondary | ICD-10-CM | POA: Diagnosis not present

## 2020-03-11 DIAGNOSIS — N2581 Secondary hyperparathyroidism of renal origin: Secondary | ICD-10-CM | POA: Diagnosis not present

## 2020-03-11 DIAGNOSIS — N186 End stage renal disease: Secondary | ICD-10-CM | POA: Diagnosis not present

## 2020-03-11 DIAGNOSIS — Z992 Dependence on renal dialysis: Secondary | ICD-10-CM | POA: Diagnosis not present

## 2020-03-12 DIAGNOSIS — I120 Hypertensive chronic kidney disease with stage 5 chronic kidney disease or end stage renal disease: Secondary | ICD-10-CM | POA: Diagnosis not present

## 2020-03-12 DIAGNOSIS — Z992 Dependence on renal dialysis: Secondary | ICD-10-CM | POA: Diagnosis not present

## 2020-03-12 DIAGNOSIS — N186 End stage renal disease: Secondary | ICD-10-CM | POA: Diagnosis not present

## 2020-03-14 ENCOUNTER — Telehealth: Payer: Self-pay | Admitting: Pharmacy Technician

## 2020-03-14 DIAGNOSIS — N186 End stage renal disease: Secondary | ICD-10-CM | POA: Diagnosis not present

## 2020-03-14 DIAGNOSIS — N2581 Secondary hyperparathyroidism of renal origin: Secondary | ICD-10-CM | POA: Diagnosis not present

## 2020-03-14 DIAGNOSIS — Z992 Dependence on renal dialysis: Secondary | ICD-10-CM | POA: Diagnosis not present

## 2020-03-14 MED FILL — BIKTARVY 50-200-25 MG TABS: 50-200-25 | 30 days supply | Qty: 30 | Fill #3

## 2020-03-14 NOTE — Telephone Encounter (Addendum)
[facility] Patient Advocate Encounter  Completed and sent [employer] Advancing Access application for Tiffany Golden for this patient who is uninsured.    Patient is approved 03/14/2020 through 03/14/2021.  BIN      [id number] PCN    [id number] GRP    [id number] ID        [id number]   [name] [name] Patient [facility] Phone: [phone] Fax:  [phone]

## 2020-03-16 DIAGNOSIS — N186 End stage renal disease: Secondary | ICD-10-CM | POA: Diagnosis not present

## 2020-03-16 DIAGNOSIS — N2581 Secondary hyperparathyroidism of renal origin: Secondary | ICD-10-CM | POA: Diagnosis not present

## 2020-03-16 DIAGNOSIS — Z992 Dependence on renal dialysis: Secondary | ICD-10-CM | POA: Diagnosis not present

## 2020-03-18 DIAGNOSIS — Z992 Dependence on renal dialysis: Secondary | ICD-10-CM | POA: Diagnosis not present

## 2020-03-18 DIAGNOSIS — N2581 Secondary hyperparathyroidism of renal origin: Secondary | ICD-10-CM | POA: Diagnosis not present

## 2020-03-18 DIAGNOSIS — N186 End stage renal disease: Secondary | ICD-10-CM | POA: Diagnosis not present

## 2020-03-21 DIAGNOSIS — N2581 Secondary hyperparathyroidism of renal origin: Secondary | ICD-10-CM | POA: Diagnosis not present

## 2020-03-21 DIAGNOSIS — N186 End stage renal disease: Secondary | ICD-10-CM | POA: Diagnosis not present

## 2020-03-21 DIAGNOSIS — Z992 Dependence on renal dialysis: Secondary | ICD-10-CM | POA: Diagnosis not present

## 2020-03-23 DIAGNOSIS — Z992 Dependence on renal dialysis: Secondary | ICD-10-CM | POA: Diagnosis not present

## 2020-03-23 DIAGNOSIS — N186 End stage renal disease: Secondary | ICD-10-CM | POA: Diagnosis not present

## 2020-03-23 DIAGNOSIS — N2581 Secondary hyperparathyroidism of renal origin: Secondary | ICD-10-CM | POA: Diagnosis not present

## 2020-03-25 DIAGNOSIS — N2581 Secondary hyperparathyroidism of renal origin: Secondary | ICD-10-CM | POA: Diagnosis not present

## 2020-03-25 DIAGNOSIS — N186 End stage renal disease: Secondary | ICD-10-CM | POA: Diagnosis not present

## 2020-03-25 DIAGNOSIS — Z992 Dependence on renal dialysis: Secondary | ICD-10-CM | POA: Diagnosis not present

## 2020-03-28 DIAGNOSIS — Z992 Dependence on renal dialysis: Secondary | ICD-10-CM | POA: Diagnosis not present

## 2020-03-28 DIAGNOSIS — N186 End stage renal disease: Secondary | ICD-10-CM | POA: Diagnosis not present

## 2020-03-28 DIAGNOSIS — N2581 Secondary hyperparathyroidism of renal origin: Secondary | ICD-10-CM | POA: Diagnosis not present

## 2020-03-30 DIAGNOSIS — N2581 Secondary hyperparathyroidism of renal origin: Secondary | ICD-10-CM | POA: Diagnosis not present

## 2020-03-30 DIAGNOSIS — Z992 Dependence on renal dialysis: Secondary | ICD-10-CM | POA: Diagnosis not present

## 2020-03-30 DIAGNOSIS — N186 End stage renal disease: Secondary | ICD-10-CM | POA: Diagnosis not present

## 2020-04-01 DIAGNOSIS — N186 End stage renal disease: Secondary | ICD-10-CM | POA: Diagnosis not present

## 2020-04-01 DIAGNOSIS — Z992 Dependence on renal dialysis: Secondary | ICD-10-CM | POA: Diagnosis not present

## 2020-04-01 DIAGNOSIS — N2581 Secondary hyperparathyroidism of renal origin: Secondary | ICD-10-CM | POA: Diagnosis not present

## 2020-04-04 DIAGNOSIS — N186 End stage renal disease: Secondary | ICD-10-CM | POA: Diagnosis not present

## 2020-04-04 DIAGNOSIS — N2581 Secondary hyperparathyroidism of renal origin: Secondary | ICD-10-CM | POA: Diagnosis not present

## 2020-04-04 DIAGNOSIS — Z992 Dependence on renal dialysis: Secondary | ICD-10-CM | POA: Diagnosis not present

## 2020-04-06 DIAGNOSIS — N2581 Secondary hyperparathyroidism of renal origin: Secondary | ICD-10-CM | POA: Diagnosis not present

## 2020-04-06 DIAGNOSIS — Z992 Dependence on renal dialysis: Secondary | ICD-10-CM | POA: Diagnosis not present

## 2020-04-06 DIAGNOSIS — N186 End stage renal disease: Secondary | ICD-10-CM | POA: Diagnosis not present

## 2020-04-08 DIAGNOSIS — N2581 Secondary hyperparathyroidism of renal origin: Secondary | ICD-10-CM | POA: Diagnosis not present

## 2020-04-08 DIAGNOSIS — N186 End stage renal disease: Secondary | ICD-10-CM | POA: Diagnosis not present

## 2020-04-08 DIAGNOSIS — Z992 Dependence on renal dialysis: Secondary | ICD-10-CM | POA: Diagnosis not present

## 2020-04-11 DIAGNOSIS — N2581 Secondary hyperparathyroidism of renal origin: Secondary | ICD-10-CM | POA: Diagnosis not present

## 2020-04-11 DIAGNOSIS — Z992 Dependence on renal dialysis: Secondary | ICD-10-CM | POA: Diagnosis not present

## 2020-04-11 DIAGNOSIS — N186 End stage renal disease: Secondary | ICD-10-CM | POA: Diagnosis not present

## 2020-04-19 ENCOUNTER — Telehealth: Payer: Self-pay | Admitting: Pharmacy Technician

## 2020-04-19 NOTE — Telephone Encounter (Signed)
[facility] Patient Advocate Encounter   I was successful in securing patient a $7500 grant from [employer] [place] (PAF) to provide copayment coverage for Biktarvy. This will make the out of pocket cost $0.     I have spoken with the patient.    The billing information is as follows and has been shared with [place].     Dates of Eligibility: 04/19/2020 through 04/19/2021  Patient knows to call the office with questions or concerns.  Bartholomew Crews, CPhT Specialty Pharmacy Patient [facility] Phone: [phone] Fax: [phone] 04/19/2020 2:11 PM

## 2020-04-20 NOTE — Telephone Encounter (Signed)
[facility] Patient Advocate Encounter  Received a fax notification from [place] [facility] [employer] that the patient's below information has been extended approval 03/14/2020 to 03/14/2021.  Patient has active medicare coverage with [employer] so I don't see [place] needing Advancing Access coverage. Below billing remains the same. Her PAF grant was renewed as well. Her last refill shipped out 04/18/2020 using [employer] and PAF.

## 2020-05-12 DIAGNOSIS — I120 Hypertensive chronic kidney disease with stage 5 chronic kidney disease or end stage renal disease: Secondary | ICD-10-CM | POA: Diagnosis not present

## 2020-05-12 DIAGNOSIS — Z992 Dependence on renal dialysis: Secondary | ICD-10-CM | POA: Diagnosis not present

## 2020-05-12 DIAGNOSIS — N186 End stage renal disease: Secondary | ICD-10-CM | POA: Diagnosis not present

## 2020-05-13 DIAGNOSIS — N2581 Secondary hyperparathyroidism of renal origin: Secondary | ICD-10-CM | POA: Diagnosis not present

## 2020-05-13 DIAGNOSIS — Z992 Dependence on renal dialysis: Secondary | ICD-10-CM | POA: Diagnosis not present

## 2020-05-13 DIAGNOSIS — N186 End stage renal disease: Secondary | ICD-10-CM | POA: Diagnosis not present

## 2020-05-16 DIAGNOSIS — N186 End stage renal disease: Secondary | ICD-10-CM | POA: Diagnosis not present

## 2020-05-16 DIAGNOSIS — N2581 Secondary hyperparathyroidism of renal origin: Secondary | ICD-10-CM | POA: Diagnosis not present

## 2020-05-16 DIAGNOSIS — Z992 Dependence on renal dialysis: Secondary | ICD-10-CM | POA: Diagnosis not present

## 2020-05-18 DIAGNOSIS — Z992 Dependence on renal dialysis: Secondary | ICD-10-CM | POA: Diagnosis not present

## 2020-05-18 DIAGNOSIS — N186 End stage renal disease: Secondary | ICD-10-CM | POA: Diagnosis not present

## 2020-05-18 DIAGNOSIS — N2581 Secondary hyperparathyroidism of renal origin: Secondary | ICD-10-CM | POA: Diagnosis not present

## 2020-05-19 MED FILL — BIKTARVY 50-200-25 MG TABS: 50-200-25 | 30 days supply | Qty: 30 | Fill #1

## 2020-05-20 DIAGNOSIS — N186 End stage renal disease: Secondary | ICD-10-CM | POA: Diagnosis not present

## 2020-05-20 DIAGNOSIS — N2581 Secondary hyperparathyroidism of renal origin: Secondary | ICD-10-CM | POA: Diagnosis not present

## 2020-05-20 DIAGNOSIS — Z992 Dependence on renal dialysis: Secondary | ICD-10-CM | POA: Diagnosis not present

## 2020-05-23 DIAGNOSIS — N2581 Secondary hyperparathyroidism of renal origin: Secondary | ICD-10-CM | POA: Diagnosis not present

## 2020-05-23 DIAGNOSIS — Z992 Dependence on renal dialysis: Secondary | ICD-10-CM | POA: Diagnosis not present

## 2020-05-23 DIAGNOSIS — N186 End stage renal disease: Secondary | ICD-10-CM | POA: Diagnosis not present

## 2020-05-25 DIAGNOSIS — Z992 Dependence on renal dialysis: Secondary | ICD-10-CM | POA: Diagnosis not present

## 2020-05-25 DIAGNOSIS — N186 End stage renal disease: Secondary | ICD-10-CM | POA: Diagnosis not present

## 2020-05-25 DIAGNOSIS — N2581 Secondary hyperparathyroidism of renal origin: Secondary | ICD-10-CM | POA: Diagnosis not present

## 2020-05-27 DIAGNOSIS — N2581 Secondary hyperparathyroidism of renal origin: Secondary | ICD-10-CM | POA: Diagnosis not present

## 2020-05-27 DIAGNOSIS — N186 End stage renal disease: Secondary | ICD-10-CM | POA: Diagnosis not present

## 2020-05-27 DIAGNOSIS — Z992 Dependence on renal dialysis: Secondary | ICD-10-CM | POA: Diagnosis not present

## 2020-05-30 DIAGNOSIS — Z992 Dependence on renal dialysis: Secondary | ICD-10-CM | POA: Diagnosis not present

## 2020-05-30 DIAGNOSIS — N2581 Secondary hyperparathyroidism of renal origin: Secondary | ICD-10-CM | POA: Diagnosis not present

## 2020-05-30 DIAGNOSIS — N186 End stage renal disease: Secondary | ICD-10-CM | POA: Diagnosis not present

## 2020-06-01 DIAGNOSIS — Z992 Dependence on renal dialysis: Secondary | ICD-10-CM | POA: Diagnosis not present

## 2020-06-01 DIAGNOSIS — N186 End stage renal disease: Secondary | ICD-10-CM | POA: Diagnosis not present

## 2020-06-01 DIAGNOSIS — N2581 Secondary hyperparathyroidism of renal origin: Secondary | ICD-10-CM | POA: Diagnosis not present

## 2020-06-03 DIAGNOSIS — N186 End stage renal disease: Secondary | ICD-10-CM | POA: Diagnosis not present

## 2020-06-03 DIAGNOSIS — Z992 Dependence on renal dialysis: Secondary | ICD-10-CM | POA: Diagnosis not present

## 2020-06-03 DIAGNOSIS — N2581 Secondary hyperparathyroidism of renal origin: Secondary | ICD-10-CM | POA: Diagnosis not present

## 2020-06-06 DIAGNOSIS — Z992 Dependence on renal dialysis: Secondary | ICD-10-CM | POA: Diagnosis not present

## 2020-06-06 DIAGNOSIS — N2581 Secondary hyperparathyroidism of renal origin: Secondary | ICD-10-CM | POA: Diagnosis not present

## 2020-06-06 DIAGNOSIS — N186 End stage renal disease: Secondary | ICD-10-CM | POA: Diagnosis not present

## 2020-06-08 DIAGNOSIS — N186 End stage renal disease: Secondary | ICD-10-CM | POA: Diagnosis not present

## 2020-06-08 DIAGNOSIS — N2581 Secondary hyperparathyroidism of renal origin: Secondary | ICD-10-CM | POA: Diagnosis not present

## 2020-06-08 DIAGNOSIS — Z992 Dependence on renal dialysis: Secondary | ICD-10-CM | POA: Diagnosis not present

## 2020-06-10 DIAGNOSIS — N186 End stage renal disease: Secondary | ICD-10-CM | POA: Diagnosis not present

## 2020-06-10 DIAGNOSIS — N2581 Secondary hyperparathyroidism of renal origin: Secondary | ICD-10-CM | POA: Diagnosis not present

## 2020-06-10 DIAGNOSIS — Z992 Dependence on renal dialysis: Secondary | ICD-10-CM | POA: Diagnosis not present

## 2020-06-12 DIAGNOSIS — N186 End stage renal disease: Secondary | ICD-10-CM | POA: Diagnosis not present

## 2020-06-12 DIAGNOSIS — I120 Hypertensive chronic kidney disease with stage 5 chronic kidney disease or end stage renal disease: Secondary | ICD-10-CM | POA: Diagnosis not present

## 2020-06-12 DIAGNOSIS — Z992 Dependence on renal dialysis: Secondary | ICD-10-CM | POA: Diagnosis not present

## 2020-06-13 DIAGNOSIS — N186 End stage renal disease: Secondary | ICD-10-CM | POA: Diagnosis not present

## 2020-06-13 DIAGNOSIS — N2581 Secondary hyperparathyroidism of renal origin: Secondary | ICD-10-CM | POA: Diagnosis not present

## 2020-06-13 DIAGNOSIS — Z992 Dependence on renal dialysis: Secondary | ICD-10-CM | POA: Diagnosis not present

## 2020-06-15 DIAGNOSIS — Z992 Dependence on renal dialysis: Secondary | ICD-10-CM | POA: Diagnosis not present

## 2020-06-15 DIAGNOSIS — N2581 Secondary hyperparathyroidism of renal origin: Secondary | ICD-10-CM | POA: Diagnosis not present

## 2020-06-15 DIAGNOSIS — N186 End stage renal disease: Secondary | ICD-10-CM | POA: Diagnosis not present

## 2020-06-17 DIAGNOSIS — Z992 Dependence on renal dialysis: Secondary | ICD-10-CM | POA: Diagnosis not present

## 2020-06-17 DIAGNOSIS — N2581 Secondary hyperparathyroidism of renal origin: Secondary | ICD-10-CM | POA: Diagnosis not present

## 2020-06-17 DIAGNOSIS — N186 End stage renal disease: Secondary | ICD-10-CM | POA: Diagnosis not present

## 2020-06-20 DIAGNOSIS — Z992 Dependence on renal dialysis: Secondary | ICD-10-CM | POA: Diagnosis not present

## 2020-06-20 DIAGNOSIS — N186 End stage renal disease: Secondary | ICD-10-CM | POA: Diagnosis not present

## 2020-06-20 DIAGNOSIS — N2581 Secondary hyperparathyroidism of renal origin: Secondary | ICD-10-CM | POA: Diagnosis not present

## 2020-06-22 DIAGNOSIS — Z992 Dependence on renal dialysis: Secondary | ICD-10-CM | POA: Diagnosis not present

## 2020-06-22 DIAGNOSIS — N2581 Secondary hyperparathyroidism of renal origin: Secondary | ICD-10-CM | POA: Diagnosis not present

## 2020-06-22 DIAGNOSIS — N186 End stage renal disease: Secondary | ICD-10-CM | POA: Diagnosis not present

## 2020-06-23 MED FILL — BIKTARVY 50-200-25 MG TABS: 50-200-25 | 30 days supply | Qty: 30 | Fill #2

## 2020-06-24 DIAGNOSIS — N2581 Secondary hyperparathyroidism of renal origin: Secondary | ICD-10-CM | POA: Diagnosis not present

## 2020-06-24 DIAGNOSIS — Z992 Dependence on renal dialysis: Secondary | ICD-10-CM | POA: Diagnosis not present

## 2020-06-24 DIAGNOSIS — N186 End stage renal disease: Secondary | ICD-10-CM | POA: Diagnosis not present

## 2020-06-27 DIAGNOSIS — Z992 Dependence on renal dialysis: Secondary | ICD-10-CM | POA: Diagnosis not present

## 2020-06-27 DIAGNOSIS — N186 End stage renal disease: Secondary | ICD-10-CM | POA: Diagnosis not present

## 2020-06-27 DIAGNOSIS — N2581 Secondary hyperparathyroidism of renal origin: Secondary | ICD-10-CM | POA: Diagnosis not present

## 2020-06-29 DIAGNOSIS — Z992 Dependence on renal dialysis: Secondary | ICD-10-CM | POA: Diagnosis not present

## 2020-06-29 DIAGNOSIS — N186 End stage renal disease: Secondary | ICD-10-CM | POA: Diagnosis not present

## 2020-06-29 DIAGNOSIS — N2581 Secondary hyperparathyroidism of renal origin: Secondary | ICD-10-CM | POA: Diagnosis not present

## 2020-07-01 DIAGNOSIS — N186 End stage renal disease: Secondary | ICD-10-CM | POA: Diagnosis not present

## 2020-07-01 DIAGNOSIS — N2581 Secondary hyperparathyroidism of renal origin: Secondary | ICD-10-CM | POA: Diagnosis not present

## 2020-07-01 DIAGNOSIS — Z992 Dependence on renal dialysis: Secondary | ICD-10-CM | POA: Diagnosis not present

## 2020-07-04 DIAGNOSIS — N2581 Secondary hyperparathyroidism of renal origin: Secondary | ICD-10-CM | POA: Diagnosis not present

## 2020-07-04 DIAGNOSIS — Z992 Dependence on renal dialysis: Secondary | ICD-10-CM | POA: Diagnosis not present

## 2020-07-04 DIAGNOSIS — N186 End stage renal disease: Secondary | ICD-10-CM | POA: Diagnosis not present

## 2020-07-06 DIAGNOSIS — N186 End stage renal disease: Secondary | ICD-10-CM | POA: Diagnosis not present

## 2020-07-06 DIAGNOSIS — N2581 Secondary hyperparathyroidism of renal origin: Secondary | ICD-10-CM | POA: Diagnosis not present

## 2020-07-06 DIAGNOSIS — Z992 Dependence on renal dialysis: Secondary | ICD-10-CM | POA: Diagnosis not present

## 2020-07-08 DIAGNOSIS — N2581 Secondary hyperparathyroidism of renal origin: Secondary | ICD-10-CM | POA: Diagnosis not present

## 2020-07-08 DIAGNOSIS — N186 End stage renal disease: Secondary | ICD-10-CM | POA: Diagnosis not present

## 2020-07-08 DIAGNOSIS — Z992 Dependence on renal dialysis: Secondary | ICD-10-CM | POA: Diagnosis not present

## 2020-07-11 DIAGNOSIS — Z992 Dependence on renal dialysis: Secondary | ICD-10-CM | POA: Diagnosis not present

## 2020-07-11 DIAGNOSIS — N186 End stage renal disease: Secondary | ICD-10-CM | POA: Diagnosis not present

## 2020-07-11 DIAGNOSIS — N2581 Secondary hyperparathyroidism of renal origin: Secondary | ICD-10-CM | POA: Diagnosis not present

## 2020-07-13 DIAGNOSIS — Z23 Encounter for immunization: Secondary | ICD-10-CM | POA: Diagnosis not present

## 2020-07-13 DIAGNOSIS — N2581 Secondary hyperparathyroidism of renal origin: Secondary | ICD-10-CM | POA: Diagnosis not present

## 2020-07-13 DIAGNOSIS — Z992 Dependence on renal dialysis: Secondary | ICD-10-CM | POA: Diagnosis not present

## 2020-07-13 DIAGNOSIS — I120 Hypertensive chronic kidney disease with stage 5 chronic kidney disease or end stage renal disease: Secondary | ICD-10-CM | POA: Diagnosis not present

## 2020-07-13 DIAGNOSIS — N186 End stage renal disease: Secondary | ICD-10-CM | POA: Diagnosis not present

## 2020-07-15 DIAGNOSIS — N186 End stage renal disease: Secondary | ICD-10-CM | POA: Diagnosis not present

## 2020-07-15 DIAGNOSIS — Z23 Encounter for immunization: Secondary | ICD-10-CM | POA: Diagnosis not present

## 2020-07-15 DIAGNOSIS — N2581 Secondary hyperparathyroidism of renal origin: Secondary | ICD-10-CM | POA: Diagnosis not present

## 2020-07-15 DIAGNOSIS — Z992 Dependence on renal dialysis: Secondary | ICD-10-CM | POA: Diagnosis not present

## 2020-07-18 DIAGNOSIS — Z992 Dependence on renal dialysis: Secondary | ICD-10-CM | POA: Diagnosis not present

## 2020-07-18 DIAGNOSIS — Z23 Encounter for immunization: Secondary | ICD-10-CM | POA: Diagnosis not present

## 2020-07-18 DIAGNOSIS — N2581 Secondary hyperparathyroidism of renal origin: Secondary | ICD-10-CM | POA: Diagnosis not present

## 2020-07-18 DIAGNOSIS — N186 End stage renal disease: Secondary | ICD-10-CM | POA: Diagnosis not present

## 2020-07-20 DIAGNOSIS — N186 End stage renal disease: Secondary | ICD-10-CM | POA: Diagnosis not present

## 2020-07-20 DIAGNOSIS — Z992 Dependence on renal dialysis: Secondary | ICD-10-CM | POA: Diagnosis not present

## 2020-07-20 DIAGNOSIS — N2581 Secondary hyperparathyroidism of renal origin: Secondary | ICD-10-CM | POA: Diagnosis not present

## 2020-07-20 DIAGNOSIS — Z23 Encounter for immunization: Secondary | ICD-10-CM | POA: Diagnosis not present

## 2020-07-22 DIAGNOSIS — Z992 Dependence on renal dialysis: Secondary | ICD-10-CM | POA: Diagnosis not present

## 2020-07-22 DIAGNOSIS — Z23 Encounter for immunization: Secondary | ICD-10-CM | POA: Diagnosis not present

## 2020-07-22 DIAGNOSIS — N186 End stage renal disease: Secondary | ICD-10-CM | POA: Diagnosis not present

## 2020-07-22 DIAGNOSIS — N2581 Secondary hyperparathyroidism of renal origin: Secondary | ICD-10-CM | POA: Diagnosis not present

## 2020-07-25 DIAGNOSIS — N186 End stage renal disease: Secondary | ICD-10-CM | POA: Diagnosis not present

## 2020-07-25 DIAGNOSIS — N2581 Secondary hyperparathyroidism of renal origin: Secondary | ICD-10-CM | POA: Diagnosis not present

## 2020-07-25 DIAGNOSIS — Z992 Dependence on renal dialysis: Secondary | ICD-10-CM | POA: Diagnosis not present

## 2020-07-25 DIAGNOSIS — Z23 Encounter for immunization: Secondary | ICD-10-CM | POA: Diagnosis not present

## 2020-07-27 DIAGNOSIS — Z23 Encounter for immunization: Secondary | ICD-10-CM | POA: Diagnosis not present

## 2020-07-27 DIAGNOSIS — N2581 Secondary hyperparathyroidism of renal origin: Secondary | ICD-10-CM | POA: Diagnosis not present

## 2020-07-27 DIAGNOSIS — Z992 Dependence on renal dialysis: Secondary | ICD-10-CM | POA: Diagnosis not present

## 2020-07-27 DIAGNOSIS — N186 End stage renal disease: Secondary | ICD-10-CM | POA: Diagnosis not present

## 2020-07-28 MED FILL — BIKTARVY 50-200-25 MG TABS: 50-200-25 | 30 days supply | Qty: 30 | Fill #3

## 2020-07-29 DIAGNOSIS — Z992 Dependence on renal dialysis: Secondary | ICD-10-CM | POA: Diagnosis not present

## 2020-07-29 DIAGNOSIS — N2581 Secondary hyperparathyroidism of renal origin: Secondary | ICD-10-CM | POA: Diagnosis not present

## 2020-07-29 DIAGNOSIS — Z23 Encounter for immunization: Secondary | ICD-10-CM | POA: Diagnosis not present

## 2020-07-29 DIAGNOSIS — N186 End stage renal disease: Secondary | ICD-10-CM | POA: Diagnosis not present

## 2020-08-01 DIAGNOSIS — N2581 Secondary hyperparathyroidism of renal origin: Secondary | ICD-10-CM | POA: Diagnosis not present

## 2020-08-01 DIAGNOSIS — Z23 Encounter for immunization: Secondary | ICD-10-CM | POA: Diagnosis not present

## 2020-08-01 DIAGNOSIS — Z992 Dependence on renal dialysis: Secondary | ICD-10-CM | POA: Diagnosis not present

## 2020-08-01 DIAGNOSIS — N186 End stage renal disease: Secondary | ICD-10-CM | POA: Diagnosis not present

## 2020-08-03 DIAGNOSIS — N186 End stage renal disease: Secondary | ICD-10-CM | POA: Diagnosis not present

## 2020-08-03 DIAGNOSIS — Z23 Encounter for immunization: Secondary | ICD-10-CM | POA: Diagnosis not present

## 2020-08-03 DIAGNOSIS — Z992 Dependence on renal dialysis: Secondary | ICD-10-CM | POA: Diagnosis not present

## 2020-08-03 DIAGNOSIS — N2581 Secondary hyperparathyroidism of renal origin: Secondary | ICD-10-CM | POA: Diagnosis not present

## 2020-08-05 DIAGNOSIS — Z992 Dependence on renal dialysis: Secondary | ICD-10-CM | POA: Diagnosis not present

## 2020-08-05 DIAGNOSIS — N2581 Secondary hyperparathyroidism of renal origin: Secondary | ICD-10-CM | POA: Diagnosis not present

## 2020-08-05 DIAGNOSIS — N186 End stage renal disease: Secondary | ICD-10-CM | POA: Diagnosis not present

## 2020-08-05 DIAGNOSIS — Z23 Encounter for immunization: Secondary | ICD-10-CM | POA: Diagnosis not present

## 2020-08-08 DIAGNOSIS — Z992 Dependence on renal dialysis: Secondary | ICD-10-CM | POA: Diagnosis not present

## 2020-08-08 DIAGNOSIS — N186 End stage renal disease: Secondary | ICD-10-CM | POA: Diagnosis not present

## 2020-08-08 DIAGNOSIS — N2581 Secondary hyperparathyroidism of renal origin: Secondary | ICD-10-CM | POA: Diagnosis not present

## 2020-08-08 DIAGNOSIS — Z23 Encounter for immunization: Secondary | ICD-10-CM | POA: Diagnosis not present

## 2020-08-10 DIAGNOSIS — N2581 Secondary hyperparathyroidism of renal origin: Secondary | ICD-10-CM | POA: Diagnosis not present

## 2020-08-10 DIAGNOSIS — Z992 Dependence on renal dialysis: Secondary | ICD-10-CM | POA: Diagnosis not present

## 2020-08-10 DIAGNOSIS — N186 End stage renal disease: Secondary | ICD-10-CM | POA: Diagnosis not present

## 2020-08-10 DIAGNOSIS — Z23 Encounter for immunization: Secondary | ICD-10-CM | POA: Diagnosis not present

## 2020-08-12 DIAGNOSIS — N186 End stage renal disease: Secondary | ICD-10-CM | POA: Diagnosis not present

## 2020-08-12 DIAGNOSIS — I120 Hypertensive chronic kidney disease with stage 5 chronic kidney disease or end stage renal disease: Secondary | ICD-10-CM | POA: Diagnosis not present

## 2020-08-12 DIAGNOSIS — N2581 Secondary hyperparathyroidism of renal origin: Secondary | ICD-10-CM | POA: Diagnosis not present

## 2020-08-12 DIAGNOSIS — Z992 Dependence on renal dialysis: Secondary | ICD-10-CM | POA: Diagnosis not present

## 2020-08-15 DIAGNOSIS — Z992 Dependence on renal dialysis: Secondary | ICD-10-CM | POA: Diagnosis not present

## 2020-08-15 DIAGNOSIS — N186 End stage renal disease: Secondary | ICD-10-CM | POA: Diagnosis not present

## 2020-08-15 DIAGNOSIS — N2581 Secondary hyperparathyroidism of renal origin: Secondary | ICD-10-CM | POA: Diagnosis not present

## 2020-08-17 DIAGNOSIS — N2581 Secondary hyperparathyroidism of renal origin: Secondary | ICD-10-CM | POA: Diagnosis not present

## 2020-08-17 DIAGNOSIS — Z992 Dependence on renal dialysis: Secondary | ICD-10-CM | POA: Diagnosis not present

## 2020-08-17 DIAGNOSIS — N186 End stage renal disease: Secondary | ICD-10-CM | POA: Diagnosis not present

## 2020-08-19 DIAGNOSIS — N186 End stage renal disease: Secondary | ICD-10-CM | POA: Diagnosis not present

## 2020-08-19 DIAGNOSIS — N2581 Secondary hyperparathyroidism of renal origin: Secondary | ICD-10-CM | POA: Diagnosis not present

## 2020-08-19 DIAGNOSIS — Z992 Dependence on renal dialysis: Secondary | ICD-10-CM | POA: Diagnosis not present

## 2020-08-22 DIAGNOSIS — N186 End stage renal disease: Secondary | ICD-10-CM | POA: Diagnosis not present

## 2020-08-22 DIAGNOSIS — N2581 Secondary hyperparathyroidism of renal origin: Secondary | ICD-10-CM | POA: Diagnosis not present

## 2020-08-22 DIAGNOSIS — Z992 Dependence on renal dialysis: Secondary | ICD-10-CM | POA: Diagnosis not present

## 2020-08-24 DIAGNOSIS — N2581 Secondary hyperparathyroidism of renal origin: Secondary | ICD-10-CM | POA: Diagnosis not present

## 2020-08-24 DIAGNOSIS — N186 End stage renal disease: Secondary | ICD-10-CM | POA: Diagnosis not present

## 2020-08-24 DIAGNOSIS — Z992 Dependence on renal dialysis: Secondary | ICD-10-CM | POA: Diagnosis not present

## 2020-08-26 DIAGNOSIS — N2581 Secondary hyperparathyroidism of renal origin: Secondary | ICD-10-CM | POA: Diagnosis not present

## 2020-08-26 DIAGNOSIS — Z992 Dependence on renal dialysis: Secondary | ICD-10-CM | POA: Diagnosis not present

## 2020-08-26 DIAGNOSIS — N186 End stage renal disease: Secondary | ICD-10-CM | POA: Diagnosis not present

## 2020-08-29 DIAGNOSIS — Z992 Dependence on renal dialysis: Secondary | ICD-10-CM | POA: Diagnosis not present

## 2020-08-29 DIAGNOSIS — N186 End stage renal disease: Secondary | ICD-10-CM | POA: Diagnosis not present

## 2020-08-29 DIAGNOSIS — N2581 Secondary hyperparathyroidism of renal origin: Secondary | ICD-10-CM | POA: Diagnosis not present

## 2020-08-31 DIAGNOSIS — N186 End stage renal disease: Secondary | ICD-10-CM | POA: Diagnosis not present

## 2020-08-31 DIAGNOSIS — N2581 Secondary hyperparathyroidism of renal origin: Secondary | ICD-10-CM | POA: Diagnosis not present

## 2020-08-31 DIAGNOSIS — Z992 Dependence on renal dialysis: Secondary | ICD-10-CM | POA: Diagnosis not present

## 2020-09-02 DIAGNOSIS — N186 End stage renal disease: Secondary | ICD-10-CM | POA: Diagnosis not present

## 2020-09-02 DIAGNOSIS — N2581 Secondary hyperparathyroidism of renal origin: Secondary | ICD-10-CM | POA: Diagnosis not present

## 2020-09-02 DIAGNOSIS — Z992 Dependence on renal dialysis: Secondary | ICD-10-CM | POA: Diagnosis not present

## 2020-09-05 DIAGNOSIS — N186 End stage renal disease: Secondary | ICD-10-CM | POA: Diagnosis not present

## 2020-09-05 DIAGNOSIS — Z992 Dependence on renal dialysis: Secondary | ICD-10-CM | POA: Diagnosis not present

## 2020-09-05 DIAGNOSIS — N2581 Secondary hyperparathyroidism of renal origin: Secondary | ICD-10-CM | POA: Diagnosis not present

## 2020-09-05 MED FILL — BIKTARVY 50-200-25 MG TABS: 50-200-25 | 30 days supply | Qty: 30 | Fill #4

## 2020-09-07 DIAGNOSIS — Z992 Dependence on renal dialysis: Secondary | ICD-10-CM | POA: Diagnosis not present

## 2020-09-07 DIAGNOSIS — N186 End stage renal disease: Secondary | ICD-10-CM | POA: Diagnosis not present

## 2020-09-07 DIAGNOSIS — N2581 Secondary hyperparathyroidism of renal origin: Secondary | ICD-10-CM | POA: Diagnosis not present

## 2020-09-09 DIAGNOSIS — N186 End stage renal disease: Secondary | ICD-10-CM | POA: Diagnosis not present

## 2020-09-09 DIAGNOSIS — Z992 Dependence on renal dialysis: Secondary | ICD-10-CM | POA: Diagnosis not present

## 2020-09-09 DIAGNOSIS — N2581 Secondary hyperparathyroidism of renal origin: Secondary | ICD-10-CM | POA: Diagnosis not present

## 2020-09-12 DIAGNOSIS — Z992 Dependence on renal dialysis: Secondary | ICD-10-CM | POA: Diagnosis not present

## 2020-09-12 DIAGNOSIS — N2581 Secondary hyperparathyroidism of renal origin: Secondary | ICD-10-CM | POA: Diagnosis not present

## 2020-09-12 DIAGNOSIS — Z23 Encounter for immunization: Secondary | ICD-10-CM | POA: Diagnosis not present

## 2020-09-12 DIAGNOSIS — N186 End stage renal disease: Secondary | ICD-10-CM | POA: Diagnosis not present

## 2020-09-12 DIAGNOSIS — I120 Hypertensive chronic kidney disease with stage 5 chronic kidney disease or end stage renal disease: Secondary | ICD-10-CM | POA: Diagnosis not present

## 2020-09-14 DIAGNOSIS — Z23 Encounter for immunization: Secondary | ICD-10-CM | POA: Diagnosis not present

## 2020-09-14 DIAGNOSIS — N186 End stage renal disease: Secondary | ICD-10-CM | POA: Diagnosis not present

## 2020-09-14 DIAGNOSIS — Z992 Dependence on renal dialysis: Secondary | ICD-10-CM | POA: Diagnosis not present

## 2020-09-14 DIAGNOSIS — N2581 Secondary hyperparathyroidism of renal origin: Secondary | ICD-10-CM | POA: Diagnosis not present

## 2020-09-16 DIAGNOSIS — N2581 Secondary hyperparathyroidism of renal origin: Secondary | ICD-10-CM | POA: Diagnosis not present

## 2020-09-16 DIAGNOSIS — Z992 Dependence on renal dialysis: Secondary | ICD-10-CM | POA: Diagnosis not present

## 2020-09-16 DIAGNOSIS — Z23 Encounter for immunization: Secondary | ICD-10-CM | POA: Diagnosis not present

## 2020-09-16 DIAGNOSIS — N186 End stage renal disease: Secondary | ICD-10-CM | POA: Diagnosis not present

## 2020-09-19 DIAGNOSIS — Z23 Encounter for immunization: Secondary | ICD-10-CM | POA: Diagnosis not present

## 2020-09-19 DIAGNOSIS — N186 End stage renal disease: Secondary | ICD-10-CM | POA: Diagnosis not present

## 2020-09-19 DIAGNOSIS — N2581 Secondary hyperparathyroidism of renal origin: Secondary | ICD-10-CM | POA: Diagnosis not present

## 2020-09-19 DIAGNOSIS — Z992 Dependence on renal dialysis: Secondary | ICD-10-CM | POA: Diagnosis not present

## 2020-09-21 DIAGNOSIS — N2581 Secondary hyperparathyroidism of renal origin: Secondary | ICD-10-CM | POA: Diagnosis not present

## 2020-09-21 DIAGNOSIS — N186 End stage renal disease: Secondary | ICD-10-CM | POA: Diagnosis not present

## 2020-09-21 DIAGNOSIS — Z23 Encounter for immunization: Secondary | ICD-10-CM | POA: Diagnosis not present

## 2020-09-21 DIAGNOSIS — Z992 Dependence on renal dialysis: Secondary | ICD-10-CM | POA: Diagnosis not present

## 2020-09-23 DIAGNOSIS — N2581 Secondary hyperparathyroidism of renal origin: Secondary | ICD-10-CM | POA: Diagnosis not present

## 2020-09-23 DIAGNOSIS — Z992 Dependence on renal dialysis: Secondary | ICD-10-CM | POA: Diagnosis not present

## 2020-09-23 DIAGNOSIS — Z23 Encounter for immunization: Secondary | ICD-10-CM | POA: Diagnosis not present

## 2020-09-23 DIAGNOSIS — N186 End stage renal disease: Secondary | ICD-10-CM | POA: Diagnosis not present

## 2020-09-26 DIAGNOSIS — N186 End stage renal disease: Secondary | ICD-10-CM | POA: Diagnosis not present

## 2020-09-26 DIAGNOSIS — Z23 Encounter for immunization: Secondary | ICD-10-CM | POA: Diagnosis not present

## 2020-09-26 DIAGNOSIS — N2581 Secondary hyperparathyroidism of renal origin: Secondary | ICD-10-CM | POA: Diagnosis not present

## 2020-09-26 DIAGNOSIS — Z992 Dependence on renal dialysis: Secondary | ICD-10-CM | POA: Diagnosis not present

## 2020-09-28 DIAGNOSIS — N186 End stage renal disease: Secondary | ICD-10-CM | POA: Diagnosis not present

## 2020-09-28 DIAGNOSIS — N2581 Secondary hyperparathyroidism of renal origin: Secondary | ICD-10-CM | POA: Diagnosis not present

## 2020-09-28 DIAGNOSIS — Z992 Dependence on renal dialysis: Secondary | ICD-10-CM | POA: Diagnosis not present

## 2020-09-28 DIAGNOSIS — Z23 Encounter for immunization: Secondary | ICD-10-CM | POA: Diagnosis not present

## 2020-09-30 DIAGNOSIS — N186 End stage renal disease: Secondary | ICD-10-CM | POA: Diagnosis not present

## 2020-09-30 DIAGNOSIS — Z23 Encounter for immunization: Secondary | ICD-10-CM | POA: Diagnosis not present

## 2020-09-30 DIAGNOSIS — N2581 Secondary hyperparathyroidism of renal origin: Secondary | ICD-10-CM | POA: Diagnosis not present

## 2020-09-30 DIAGNOSIS — Z992 Dependence on renal dialysis: Secondary | ICD-10-CM | POA: Diagnosis not present

## 2020-10-02 DIAGNOSIS — Z23 Encounter for immunization: Secondary | ICD-10-CM | POA: Diagnosis not present

## 2020-10-02 DIAGNOSIS — N2581 Secondary hyperparathyroidism of renal origin: Secondary | ICD-10-CM | POA: Diagnosis not present

## 2020-10-02 DIAGNOSIS — Z992 Dependence on renal dialysis: Secondary | ICD-10-CM | POA: Diagnosis not present

## 2020-10-02 DIAGNOSIS — N186 End stage renal disease: Secondary | ICD-10-CM | POA: Diagnosis not present

## 2020-10-03 MED FILL — BIKTARVY 50-200-25 MG TABS: 50-200-25 | 30 days supply | Qty: 30 | Fill #5

## 2020-10-04 DIAGNOSIS — N2581 Secondary hyperparathyroidism of renal origin: Secondary | ICD-10-CM | POA: Diagnosis not present

## 2020-10-04 DIAGNOSIS — N186 End stage renal disease: Secondary | ICD-10-CM | POA: Diagnosis not present

## 2020-10-04 DIAGNOSIS — Z23 Encounter for immunization: Secondary | ICD-10-CM | POA: Diagnosis not present

## 2020-10-04 DIAGNOSIS — Z992 Dependence on renal dialysis: Secondary | ICD-10-CM | POA: Diagnosis not present

## 2020-10-07 DIAGNOSIS — Z992 Dependence on renal dialysis: Secondary | ICD-10-CM | POA: Diagnosis not present

## 2020-10-07 DIAGNOSIS — Z23 Encounter for immunization: Secondary | ICD-10-CM | POA: Diagnosis not present

## 2020-10-07 DIAGNOSIS — N186 End stage renal disease: Secondary | ICD-10-CM | POA: Diagnosis not present

## 2020-10-07 DIAGNOSIS — N2581 Secondary hyperparathyroidism of renal origin: Secondary | ICD-10-CM | POA: Diagnosis not present

## 2020-10-10 DIAGNOSIS — Z992 Dependence on renal dialysis: Secondary | ICD-10-CM | POA: Diagnosis not present

## 2020-10-10 DIAGNOSIS — N186 End stage renal disease: Secondary | ICD-10-CM | POA: Diagnosis not present

## 2020-10-10 DIAGNOSIS — N2581 Secondary hyperparathyroidism of renal origin: Secondary | ICD-10-CM | POA: Diagnosis not present

## 2020-10-10 DIAGNOSIS — Z23 Encounter for immunization: Secondary | ICD-10-CM | POA: Diagnosis not present

## 2020-10-12 DIAGNOSIS — N2581 Secondary hyperparathyroidism of renal origin: Secondary | ICD-10-CM | POA: Diagnosis not present

## 2020-10-12 DIAGNOSIS — D631 Anemia in chronic kidney disease: Secondary | ICD-10-CM | POA: Diagnosis not present

## 2020-10-12 DIAGNOSIS — Z992 Dependence on renal dialysis: Secondary | ICD-10-CM | POA: Diagnosis not present

## 2020-10-12 DIAGNOSIS — I120 Hypertensive chronic kidney disease with stage 5 chronic kidney disease or end stage renal disease: Secondary | ICD-10-CM | POA: Diagnosis not present

## 2020-10-12 DIAGNOSIS — N186 End stage renal disease: Secondary | ICD-10-CM | POA: Diagnosis not present

## 2020-10-14 DIAGNOSIS — Z992 Dependence on renal dialysis: Secondary | ICD-10-CM | POA: Diagnosis not present

## 2020-10-14 DIAGNOSIS — D631 Anemia in chronic kidney disease: Secondary | ICD-10-CM | POA: Diagnosis not present

## 2020-10-14 DIAGNOSIS — N186 End stage renal disease: Secondary | ICD-10-CM | POA: Diagnosis not present

## 2020-10-14 DIAGNOSIS — N2581 Secondary hyperparathyroidism of renal origin: Secondary | ICD-10-CM | POA: Diagnosis not present

## 2020-10-17 DIAGNOSIS — N186 End stage renal disease: Secondary | ICD-10-CM | POA: Diagnosis not present

## 2020-10-17 DIAGNOSIS — N2581 Secondary hyperparathyroidism of renal origin: Secondary | ICD-10-CM | POA: Diagnosis not present

## 2020-10-17 DIAGNOSIS — D631 Anemia in chronic kidney disease: Secondary | ICD-10-CM | POA: Diagnosis not present

## 2020-10-17 DIAGNOSIS — Z992 Dependence on renal dialysis: Secondary | ICD-10-CM | POA: Diagnosis not present

## 2020-10-19 DIAGNOSIS — Z992 Dependence on renal dialysis: Secondary | ICD-10-CM | POA: Diagnosis not present

## 2020-10-19 DIAGNOSIS — D631 Anemia in chronic kidney disease: Secondary | ICD-10-CM | POA: Diagnosis not present

## 2020-10-19 DIAGNOSIS — N2581 Secondary hyperparathyroidism of renal origin: Secondary | ICD-10-CM | POA: Diagnosis not present

## 2020-10-19 DIAGNOSIS — N186 End stage renal disease: Secondary | ICD-10-CM | POA: Diagnosis not present

## 2020-10-21 DIAGNOSIS — N2581 Secondary hyperparathyroidism of renal origin: Secondary | ICD-10-CM | POA: Diagnosis not present

## 2020-10-21 DIAGNOSIS — Z992 Dependence on renal dialysis: Secondary | ICD-10-CM | POA: Diagnosis not present

## 2020-10-21 DIAGNOSIS — N186 End stage renal disease: Secondary | ICD-10-CM | POA: Diagnosis not present

## 2020-10-21 DIAGNOSIS — D631 Anemia in chronic kidney disease: Secondary | ICD-10-CM | POA: Diagnosis not present

## 2020-10-24 DIAGNOSIS — Z992 Dependence on renal dialysis: Secondary | ICD-10-CM | POA: Diagnosis not present

## 2020-10-24 DIAGNOSIS — N2581 Secondary hyperparathyroidism of renal origin: Secondary | ICD-10-CM | POA: Diagnosis not present

## 2020-10-24 DIAGNOSIS — D631 Anemia in chronic kidney disease: Secondary | ICD-10-CM | POA: Diagnosis not present

## 2020-10-24 DIAGNOSIS — N186 End stage renal disease: Secondary | ICD-10-CM | POA: Diagnosis not present

## 2020-10-25 ENCOUNTER — Encounter: Payer: Self-pay | Admitting: Family Medicine

## 2020-10-25 ENCOUNTER — Ambulatory Visit (INDEPENDENT_AMBULATORY_CARE_PROVIDER_SITE_OTHER): Payer: Medicare Other | Admitting: Family Medicine

## 2020-10-25 VITALS — Temp 97.0°F | Wt 189.0 lb

## 2020-10-25 DIAGNOSIS — M5431 Sciatica, right side: Secondary | ICD-10-CM | POA: Diagnosis not present

## 2020-10-25 NOTE — Progress Notes (Signed)
   Subjective:    Patient ID: Tiffany Golden, female    DOB: 08/17/1976, 44 y.o.   MRN: [id number]  HPI Patient reports right leg pain and cramping for a couple of week. Pain fluctuates from calf to behind the knee to the thigh, worse with exertion. Patient has tried advil heating pad and resting it.  Some swelling was noted in the right leg at the dialysis clinic yesterday prior to treatment.  Hx lumbar spondylosis Some sciatica sx as well for the past few weeks On Sunday severe pain in the right leg Tried heating pad Took aleve Called todday because swelling in the leg on the right side Some tingling in the leg on the right Review of Systems  Constitutional: Negative for activity change and appetite change.  HENT: Negative for congestion and rhinorrhea.   Respiratory: Negative for cough and shortness of breath.   Cardiovascular: Negative for chest pain and leg swelling.  Gastrointestinal: Negative for abdominal pain, nausea and vomiting.  Skin: Negative for color change.  Neurological: Positive for numbness. Negative for dizziness and weakness.  Psychiatric/Behavioral: Negative for agitation and confusion.       Objective:   Physical Exam Vitals reviewed.  Constitutional:      General: She is not in acute distress.    Appearance: She is well-nourished.  HENT:     Head: Normocephalic.  Cardiovascular:     Rate and Rhythm: Normal rate and regular rhythm.     Heart sounds: Normal heart sounds. No murmur heard.   Pulmonary:     Effort: Pulmonary effort is normal.     Breath sounds: Normal breath sounds.  Musculoskeletal:        General: No edema.  Lymphadenopathy:     Cervical: No cervical adenopathy.  Neurological:     Mental Status: She is alert.  Psychiatric:        Behavior: Behavior normal.     Increased pain discomfort with straight leg raise on the right Circumferential measurement of both calfs is normal No tenderness or problems felt      Assessment &  Plan:  No signs of DVT Ultrasound not indicated Mild sciatica stretching exercises were shown Follow-up if progressive troubles or any problems I do not recommend any type of MRI or x-rays currently Tylenol as needed Warning signs regarding DVT was discussed

## 2020-10-26 DIAGNOSIS — N186 End stage renal disease: Secondary | ICD-10-CM | POA: Diagnosis not present

## 2020-10-26 DIAGNOSIS — Z992 Dependence on renal dialysis: Secondary | ICD-10-CM | POA: Diagnosis not present

## 2020-10-26 DIAGNOSIS — N2581 Secondary hyperparathyroidism of renal origin: Secondary | ICD-10-CM | POA: Diagnosis not present

## 2020-10-26 DIAGNOSIS — D631 Anemia in chronic kidney disease: Secondary | ICD-10-CM | POA: Diagnosis not present

## 2020-10-28 DIAGNOSIS — Z992 Dependence on renal dialysis: Secondary | ICD-10-CM | POA: Diagnosis not present

## 2020-10-28 DIAGNOSIS — N2581 Secondary hyperparathyroidism of renal origin: Secondary | ICD-10-CM | POA: Diagnosis not present

## 2020-10-28 DIAGNOSIS — D631 Anemia in chronic kidney disease: Secondary | ICD-10-CM | POA: Diagnosis not present

## 2020-10-28 DIAGNOSIS — N186 End stage renal disease: Secondary | ICD-10-CM | POA: Diagnosis not present

## 2020-10-31 DIAGNOSIS — N186 End stage renal disease: Secondary | ICD-10-CM | POA: Diagnosis not present

## 2020-10-31 DIAGNOSIS — N2581 Secondary hyperparathyroidism of renal origin: Secondary | ICD-10-CM | POA: Diagnosis not present

## 2020-10-31 DIAGNOSIS — Z992 Dependence on renal dialysis: Secondary | ICD-10-CM | POA: Diagnosis not present

## 2020-10-31 DIAGNOSIS — D631 Anemia in chronic kidney disease: Secondary | ICD-10-CM | POA: Diagnosis not present

## 2020-11-02 DIAGNOSIS — Z992 Dependence on renal dialysis: Secondary | ICD-10-CM | POA: Diagnosis not present

## 2020-11-02 DIAGNOSIS — N2581 Secondary hyperparathyroidism of renal origin: Secondary | ICD-10-CM | POA: Diagnosis not present

## 2020-11-02 DIAGNOSIS — N186 End stage renal disease: Secondary | ICD-10-CM | POA: Diagnosis not present

## 2020-11-02 DIAGNOSIS — D631 Anemia in chronic kidney disease: Secondary | ICD-10-CM | POA: Diagnosis not present

## 2020-11-04 DIAGNOSIS — N2581 Secondary hyperparathyroidism of renal origin: Secondary | ICD-10-CM | POA: Diagnosis not present

## 2020-11-04 DIAGNOSIS — Z992 Dependence on renal dialysis: Secondary | ICD-10-CM | POA: Diagnosis not present

## 2020-11-04 DIAGNOSIS — D631 Anemia in chronic kidney disease: Secondary | ICD-10-CM | POA: Diagnosis not present

## 2020-11-04 DIAGNOSIS — N186 End stage renal disease: Secondary | ICD-10-CM | POA: Diagnosis not present

## 2020-11-07 DIAGNOSIS — D631 Anemia in chronic kidney disease: Secondary | ICD-10-CM | POA: Diagnosis not present

## 2020-11-07 DIAGNOSIS — N186 End stage renal disease: Secondary | ICD-10-CM | POA: Diagnosis not present

## 2020-11-07 DIAGNOSIS — Z992 Dependence on renal dialysis: Secondary | ICD-10-CM | POA: Diagnosis not present

## 2020-11-07 DIAGNOSIS — N2581 Secondary hyperparathyroidism of renal origin: Secondary | ICD-10-CM | POA: Diagnosis not present

## 2020-11-07 MED FILL — BIKTARVY 50-200-25 MG TABS: 50-200-25 | 30 days supply | Qty: 30 | Fill #6

## 2020-11-09 DIAGNOSIS — N186 End stage renal disease: Secondary | ICD-10-CM | POA: Diagnosis not present

## 2020-11-09 DIAGNOSIS — N2581 Secondary hyperparathyroidism of renal origin: Secondary | ICD-10-CM | POA: Diagnosis not present

## 2020-11-09 DIAGNOSIS — D631 Anemia in chronic kidney disease: Secondary | ICD-10-CM | POA: Diagnosis not present

## 2020-11-09 DIAGNOSIS — Z992 Dependence on renal dialysis: Secondary | ICD-10-CM | POA: Diagnosis not present

## 2020-11-11 DIAGNOSIS — D631 Anemia in chronic kidney disease: Secondary | ICD-10-CM | POA: Diagnosis not present

## 2020-11-11 DIAGNOSIS — Z992 Dependence on renal dialysis: Secondary | ICD-10-CM | POA: Diagnosis not present

## 2020-11-11 DIAGNOSIS — N2581 Secondary hyperparathyroidism of renal origin: Secondary | ICD-10-CM | POA: Diagnosis not present

## 2020-11-11 DIAGNOSIS — N186 End stage renal disease: Secondary | ICD-10-CM | POA: Diagnosis not present

## 2020-11-12 DIAGNOSIS — Z992 Dependence on renal dialysis: Secondary | ICD-10-CM | POA: Diagnosis not present

## 2020-11-12 DIAGNOSIS — I120 Hypertensive chronic kidney disease with stage 5 chronic kidney disease or end stage renal disease: Secondary | ICD-10-CM | POA: Diagnosis not present

## 2020-11-12 DIAGNOSIS — N186 End stage renal disease: Secondary | ICD-10-CM | POA: Diagnosis not present

## 2020-11-14 DIAGNOSIS — Z992 Dependence on renal dialysis: Secondary | ICD-10-CM | POA: Diagnosis not present

## 2020-11-14 DIAGNOSIS — N186 End stage renal disease: Secondary | ICD-10-CM | POA: Diagnosis not present

## 2020-11-14 DIAGNOSIS — N2581 Secondary hyperparathyroidism of renal origin: Secondary | ICD-10-CM | POA: Diagnosis not present

## 2020-11-14 DIAGNOSIS — D631 Anemia in chronic kidney disease: Secondary | ICD-10-CM | POA: Diagnosis not present

## 2020-11-14 DIAGNOSIS — D509 Iron deficiency anemia, unspecified: Secondary | ICD-10-CM | POA: Diagnosis not present

## 2020-11-16 DIAGNOSIS — Z992 Dependence on renal dialysis: Secondary | ICD-10-CM | POA: Diagnosis not present

## 2020-11-16 DIAGNOSIS — N2581 Secondary hyperparathyroidism of renal origin: Secondary | ICD-10-CM | POA: Diagnosis not present

## 2020-11-16 DIAGNOSIS — D509 Iron deficiency anemia, unspecified: Secondary | ICD-10-CM | POA: Diagnosis not present

## 2020-11-16 DIAGNOSIS — N186 End stage renal disease: Secondary | ICD-10-CM | POA: Diagnosis not present

## 2020-11-16 DIAGNOSIS — D631 Anemia in chronic kidney disease: Secondary | ICD-10-CM | POA: Diagnosis not present

## 2020-11-18 DIAGNOSIS — D509 Iron deficiency anemia, unspecified: Secondary | ICD-10-CM | POA: Diagnosis not present

## 2020-11-18 DIAGNOSIS — D631 Anemia in chronic kidney disease: Secondary | ICD-10-CM | POA: Diagnosis not present

## 2020-11-18 DIAGNOSIS — N186 End stage renal disease: Secondary | ICD-10-CM | POA: Diagnosis not present

## 2020-11-18 DIAGNOSIS — Z992 Dependence on renal dialysis: Secondary | ICD-10-CM | POA: Diagnosis not present

## 2020-11-18 DIAGNOSIS — N2581 Secondary hyperparathyroidism of renal origin: Secondary | ICD-10-CM | POA: Diagnosis not present

## 2020-11-21 DIAGNOSIS — D631 Anemia in chronic kidney disease: Secondary | ICD-10-CM | POA: Diagnosis not present

## 2020-11-21 DIAGNOSIS — D509 Iron deficiency anemia, unspecified: Secondary | ICD-10-CM | POA: Diagnosis not present

## 2020-11-21 DIAGNOSIS — Z992 Dependence on renal dialysis: Secondary | ICD-10-CM | POA: Diagnosis not present

## 2020-11-21 DIAGNOSIS — N186 End stage renal disease: Secondary | ICD-10-CM | POA: Diagnosis not present

## 2020-11-21 DIAGNOSIS — N2581 Secondary hyperparathyroidism of renal origin: Secondary | ICD-10-CM | POA: Diagnosis not present

## 2020-11-23 DIAGNOSIS — N186 End stage renal disease: Secondary | ICD-10-CM | POA: Diagnosis not present

## 2020-11-23 DIAGNOSIS — D509 Iron deficiency anemia, unspecified: Secondary | ICD-10-CM | POA: Diagnosis not present

## 2020-11-23 DIAGNOSIS — D631 Anemia in chronic kidney disease: Secondary | ICD-10-CM | POA: Diagnosis not present

## 2020-11-23 DIAGNOSIS — N2581 Secondary hyperparathyroidism of renal origin: Secondary | ICD-10-CM | POA: Diagnosis not present

## 2020-11-23 DIAGNOSIS — Z992 Dependence on renal dialysis: Secondary | ICD-10-CM | POA: Diagnosis not present

## 2020-11-25 DIAGNOSIS — D631 Anemia in chronic kidney disease: Secondary | ICD-10-CM | POA: Diagnosis not present

## 2020-11-25 DIAGNOSIS — N2581 Secondary hyperparathyroidism of renal origin: Secondary | ICD-10-CM | POA: Diagnosis not present

## 2020-11-25 DIAGNOSIS — N186 End stage renal disease: Secondary | ICD-10-CM | POA: Diagnosis not present

## 2020-11-25 DIAGNOSIS — Z992 Dependence on renal dialysis: Secondary | ICD-10-CM | POA: Diagnosis not present

## 2020-11-25 DIAGNOSIS — D509 Iron deficiency anemia, unspecified: Secondary | ICD-10-CM | POA: Diagnosis not present

## 2020-11-28 DIAGNOSIS — N2581 Secondary hyperparathyroidism of renal origin: Secondary | ICD-10-CM | POA: Diagnosis not present

## 2020-11-28 DIAGNOSIS — Z992 Dependence on renal dialysis: Secondary | ICD-10-CM | POA: Diagnosis not present

## 2020-11-28 DIAGNOSIS — D509 Iron deficiency anemia, unspecified: Secondary | ICD-10-CM | POA: Diagnosis not present

## 2020-11-28 DIAGNOSIS — N186 End stage renal disease: Secondary | ICD-10-CM | POA: Diagnosis not present

## 2020-11-28 DIAGNOSIS — D631 Anemia in chronic kidney disease: Secondary | ICD-10-CM | POA: Diagnosis not present

## 2020-11-30 DIAGNOSIS — N2581 Secondary hyperparathyroidism of renal origin: Secondary | ICD-10-CM | POA: Diagnosis not present

## 2020-11-30 DIAGNOSIS — D631 Anemia in chronic kidney disease: Secondary | ICD-10-CM | POA: Diagnosis not present

## 2020-11-30 DIAGNOSIS — Z992 Dependence on renal dialysis: Secondary | ICD-10-CM | POA: Diagnosis not present

## 2020-11-30 DIAGNOSIS — D509 Iron deficiency anemia, unspecified: Secondary | ICD-10-CM | POA: Diagnosis not present

## 2020-11-30 DIAGNOSIS — N186 End stage renal disease: Secondary | ICD-10-CM | POA: Diagnosis not present

## 2020-12-02 DIAGNOSIS — Z992 Dependence on renal dialysis: Secondary | ICD-10-CM | POA: Diagnosis not present

## 2020-12-02 DIAGNOSIS — N186 End stage renal disease: Secondary | ICD-10-CM | POA: Diagnosis not present

## 2020-12-02 DIAGNOSIS — D509 Iron deficiency anemia, unspecified: Secondary | ICD-10-CM | POA: Diagnosis not present

## 2020-12-02 DIAGNOSIS — D631 Anemia in chronic kidney disease: Secondary | ICD-10-CM | POA: Diagnosis not present

## 2020-12-02 DIAGNOSIS — N2581 Secondary hyperparathyroidism of renal origin: Secondary | ICD-10-CM | POA: Diagnosis not present

## 2020-12-05 DIAGNOSIS — D509 Iron deficiency anemia, unspecified: Secondary | ICD-10-CM | POA: Diagnosis not present

## 2020-12-05 DIAGNOSIS — N186 End stage renal disease: Secondary | ICD-10-CM | POA: Diagnosis not present

## 2020-12-05 DIAGNOSIS — N2581 Secondary hyperparathyroidism of renal origin: Secondary | ICD-10-CM | POA: Diagnosis not present

## 2020-12-05 DIAGNOSIS — Z992 Dependence on renal dialysis: Secondary | ICD-10-CM | POA: Diagnosis not present

## 2020-12-05 DIAGNOSIS — D631 Anemia in chronic kidney disease: Secondary | ICD-10-CM | POA: Diagnosis not present

## 2020-12-07 DIAGNOSIS — D509 Iron deficiency anemia, unspecified: Secondary | ICD-10-CM | POA: Diagnosis not present

## 2020-12-07 DIAGNOSIS — D631 Anemia in chronic kidney disease: Secondary | ICD-10-CM | POA: Diagnosis not present

## 2020-12-07 DIAGNOSIS — Z992 Dependence on renal dialysis: Secondary | ICD-10-CM | POA: Diagnosis not present

## 2020-12-07 DIAGNOSIS — N186 End stage renal disease: Secondary | ICD-10-CM | POA: Diagnosis not present

## 2020-12-07 DIAGNOSIS — N2581 Secondary hyperparathyroidism of renal origin: Secondary | ICD-10-CM | POA: Diagnosis not present

## 2020-12-08 MED FILL — BIKTARVY 50-200-25 MG TABS: 50-200-25 | 30 days supply | Qty: 30 | Fill #7

## 2020-12-09 DIAGNOSIS — D631 Anemia in chronic kidney disease: Secondary | ICD-10-CM | POA: Diagnosis not present

## 2020-12-09 DIAGNOSIS — D509 Iron deficiency anemia, unspecified: Secondary | ICD-10-CM | POA: Diagnosis not present

## 2020-12-09 DIAGNOSIS — N186 End stage renal disease: Secondary | ICD-10-CM | POA: Diagnosis not present

## 2020-12-09 DIAGNOSIS — Z992 Dependence on renal dialysis: Secondary | ICD-10-CM | POA: Diagnosis not present

## 2020-12-09 DIAGNOSIS — N2581 Secondary hyperparathyroidism of renal origin: Secondary | ICD-10-CM | POA: Diagnosis not present

## 2020-12-12 DIAGNOSIS — D509 Iron deficiency anemia, unspecified: Secondary | ICD-10-CM | POA: Diagnosis not present

## 2020-12-12 DIAGNOSIS — N186 End stage renal disease: Secondary | ICD-10-CM | POA: Diagnosis not present

## 2020-12-12 DIAGNOSIS — N2581 Secondary hyperparathyroidism of renal origin: Secondary | ICD-10-CM | POA: Diagnosis not present

## 2020-12-12 DIAGNOSIS — D631 Anemia in chronic kidney disease: Secondary | ICD-10-CM | POA: Diagnosis not present

## 2020-12-12 DIAGNOSIS — Z992 Dependence on renal dialysis: Secondary | ICD-10-CM | POA: Diagnosis not present

## 2020-12-13 DIAGNOSIS — N186 End stage renal disease: Secondary | ICD-10-CM | POA: Diagnosis not present

## 2020-12-13 DIAGNOSIS — Z992 Dependence on renal dialysis: Secondary | ICD-10-CM | POA: Diagnosis not present

## 2020-12-13 DIAGNOSIS — I120 Hypertensive chronic kidney disease with stage 5 chronic kidney disease or end stage renal disease: Secondary | ICD-10-CM | POA: Diagnosis not present

## 2020-12-14 ENCOUNTER — Other Ambulatory Visit: Payer: Self-pay

## 2020-12-14 ENCOUNTER — Encounter (HOSPITAL_COMMUNITY): Payer: Self-pay | Admitting: Emergency Medicine

## 2020-12-14 ENCOUNTER — Emergency Department (HOSPITAL_COMMUNITY): Payer: Medicare Other

## 2020-12-14 ENCOUNTER — Emergency Department (HOSPITAL_COMMUNITY)
Admission: EM | Admit: 2020-12-14 | Discharge: 2020-12-14 | Disposition: A | Discharge status: Home / Self Care | Payer: Medicare Other | Attending: Emergency Medicine | Admitting: Emergency Medicine

## 2020-12-14 DIAGNOSIS — D631 Anemia in chronic kidney disease: Secondary | ICD-10-CM | POA: Diagnosis not present

## 2020-12-14 DIAGNOSIS — N2581 Secondary hyperparathyroidism of renal origin: Secondary | ICD-10-CM | POA: Diagnosis not present

## 2020-12-14 DIAGNOSIS — N186 End stage renal disease: Secondary | ICD-10-CM | POA: Insufficient documentation

## 2020-12-14 DIAGNOSIS — Z992 Dependence on renal dialysis: Secondary | ICD-10-CM | POA: Diagnosis not present

## 2020-12-14 DIAGNOSIS — R079 Chest pain, unspecified: Secondary | ICD-10-CM | POA: Diagnosis not present

## 2020-12-14 DIAGNOSIS — Z79899 Other long term (current) drug therapy: Secondary | ICD-10-CM | POA: Insufficient documentation

## 2020-12-14 DIAGNOSIS — B20 Human immunodeficiency virus [HIV] disease: Secondary | ICD-10-CM | POA: Diagnosis not present

## 2020-12-14 DIAGNOSIS — R000 Tachycardia, unspecified: Secondary | ICD-10-CM | POA: Diagnosis not present

## 2020-12-14 DIAGNOSIS — I120 Hypertensive chronic kidney disease with stage 5 chronic kidney disease or end stage renal disease: Secondary | ICD-10-CM | POA: Diagnosis not present

## 2020-12-14 DIAGNOSIS — I471 Supraventricular tachycardia: Secondary | ICD-10-CM

## 2020-12-14 DIAGNOSIS — Z87891 Personal history of nicotine dependence: Secondary | ICD-10-CM | POA: Diagnosis not present

## 2020-12-14 LAB — CBC
HCT: 41 % (ref 36.0–46.0)
Hemoglobin: 14.6 g/dL (ref 12.0–15.0)
MCH: 35.9 pg — ABNORMAL HIGH (ref 26.0–34.0)
MCHC: 35.6 g/dL (ref 30.0–36.0)
MCV: 100.7 fL — ABNORMAL HIGH (ref 80.0–100.0)
Platelets: 220 10*3/uL (ref 150–400)
RBC: 4.07 MIL/uL (ref 3.87–5.11)
RDW: 13.5 % (ref 11.5–15.5)
WBC: 7.3 10*3/uL (ref 4.0–10.5)
nRBC: 0 % (ref 0.0–0.2)

## 2020-12-14 LAB — I-STAT BETA HCG BLOOD, ED (MC, WL, AP ONLY): I-stat hCG, quantitative: 5.1 m[IU]/mL — ABNORMAL HIGH (ref <5)

## 2020-12-14 LAB — BASIC METABOLIC PANEL
Anion gap: 13 (ref 5–15)
BUN: 13 mg/dL (ref 6–20)
CO2: 30 mmol/L (ref 22–32)
Calcium: 9.1 mg/dL (ref 8.9–10.3)
Chloride: 93 mmol/L — ABNORMAL LOW (ref 98–111)
Creatinine, Ser: 5.2 mg/dL — ABNORMAL HIGH (ref 0.44–1.00)
GFR, Estimated: 10 mL/min — ABNORMAL LOW (ref >60)
Glucose, Bld: 87 mg/dL (ref 70–99)
Potassium: 4.1 mmol/L (ref 3.5–5.1)
Sodium: 136 mmol/L (ref 135–145)

## 2020-12-14 LAB — TROPONIN I (HIGH SENSITIVITY): Troponin I (High Sensitivity): 9 ng/L (ref <18)

## 2020-12-14 NOTE — Progress Notes (Signed)
Left fistula decreased.  Pressure held until bleeding stopped/  Drsg. applied

## 2020-12-14 NOTE — Discharge Instructions (Signed)
Your back in normal sinus rhythm.  Continue current meds.  Go to dialysis as scheduled  See your doctor for follow-up  Return to ER if you have palpitations, chest pain, shortness of breath.

## 2020-12-14 NOTE — ED Provider Notes (Signed)
[facility] EMERGENCY DEPARTMENT Provider Note   CSN: [id number] Arrival date & time: 12/14/20  0944     History Chief Complaint  Patient presents with  . SVT Converted    Tiffany Golden is a 45 y.o. female hx of ESRD on dialysis (last HD was today), HIV, HTN, TIA, here with possible SVT.  Patient states that she was at dialysis and but almost done with dialysis and she felt her heart racing.  Patient was noted to be SVT heart rate of 200s.  Patient states that by the time EMS was called, she converted by herself.  She did not receive any medicines to convert her.  Patient states that she got about 800 cc removed today.  She did not want to come the ER but EMS told her to come here.  She denies any chest pain or shortness of breath.  She states that she has a history of SVT previously.  The history is provided by the patient.       Past Medical History:  Diagnosis Date  . Anemia   . Chronic kidney disease   . Complication of anesthesia   . Dialysis patient [facility])    mon, wed 79  . ESRD (end stage renal disease) ([place])   . HIV infection ([place])   . Hypertension   . PONV (postoperative nausea and vomiting)   . Stroke ([place])   . TIA (transient ischemic attack)    Hx:of    Patient Active Problem List   Diagnosis Date Noted  . Patient non adherence   . Grieving   . Moderate single current episode of major depressive disorder ([place])   . Altered mental status 02/21/2017  . Menorrhagia with regular cycle 04/26/2015  . Lumbar spondylosis with myelopathy 02/02/2015  . Organ transplant candidate 08/19/2014  . End stage renal disease ([place]) 07/30/2013  . Enteritis due to Clostridium difficile 04/07/2013  . TIA (transient ischemic attack) 04/05/2013  . Fever 04/05/2013  . ESRD on hemodialysis ([place]) 04/05/2013  . AIDS (acquired immune deficiency syndrome) ([place]) 04/05/2013  . HIV disease ([place]) 09/06/2011  . ESRD (end stage renal disease) ([place]) 09/06/2011  .  Hypertension 09/06/2011    Past Surgical History:  Procedure Laterality Date  . ARTERIOVENOUS GRAFT PLACEMENT  09/11/11   left arm  . CESAREAN SECTION    . DILITATION & CURRETTAGE/HYSTROSCOPY WITH NOVASURE ABLATION N/A 05/03/2015   Procedure: DILATATION/HYSTEROSCOPY WITH NOVASURE ABLATION; uterine cavity length 5.0 cm, uterine cavity width 3.8 cm, power 105 watts; time 1 minute 12 seconds;  Surgeon: Jonnie Kind, MD;  Location: [facility];  Service: Gynecology;  Laterality: N/A;  . INSERTION OF DIALYSIS CATHETER Right 09/01/2013   Procedure: INSERTION OF DIALYSIS CATHETER Right Internal Jugular;  Surgeon: Elam Dutch, MD;  Location: [facility];  Service: Vascular;  Laterality: Right;  . PATCH ANGIOPLASTY Left 09/01/2013   Procedure: PATCH ANGIOPLASTY;  Surgeon: Elam Dutch, MD;  Location: [facility];  Service: Vascular;  Laterality: Left;  . REVISON OF ARTERIOVENOUS FISTULA Left [id number]   Procedure: Plication left arm fistula;  Surgeon: Elam Dutch, MD;  Location: [facility] OR;  Service: Vascular;  Laterality: Left;     OB History    Gravida  1   Para  1   Term  1   Preterm      AB      Living  1     SAB      IAB  Ectopic      Multiple      Live Births  1           Family History  Problem Relation Age of Onset  . Hyperlipidemia Mother   . Cancer - Other Mother        Hx partial hysterectomy  . Heart disease Father        Hx CABG    Social History   Tobacco Use  . Smoking status: Former Smoker    Packs/day: 0.50    Years: 3.00    Pack years: 1.50    Types: Cigarettes    Quit date: 09/05/2009    Years since quitting: 11.2  . Smokeless tobacco: Never Used  Vaping Use  . Vaping Use: Never used  Substance Use Topics  . Alcohol use: No  . Drug use: No    Home Medications Prior to Admission medications   Medication Sig Start Date End Date Taking? Authorizing Provider  B Complex-C-Folic Acid (RENA-VITE RX) 1 MG TABS Take 1 tablet by mouth daily.  07/13/13   [provider]  bictegravir-emtricitabine-tenofovir AF (BIKTARVY) 50-200-25 MG TABS tablet Take 1 tablet by mouth daily. 01/12/20   Truman Hayward, MD  calcium carbonate (TUMS - DOSED IN MG ELEMENTAL CALCIUM) 500 MG chewable tablet Chew 1-2 tablets by mouth daily as needed for indigestion or heartburn.    [provider]  metoprolol tartrate (LOPRESSOR) 25 MG tablet Take 12.5 mg by mouth 2 (two) times daily as needed (for blood pressure). Except takes none on dialysis days (Tuesday, Thursday, and Sunday).    [provider]  sertraline (ZOLOFT) 100 MG tablet Take 1 tablet (100 mg total) by mouth daily. 02/25/20   Kathyrn Drown, MD    Allergies    Tressie Ellis [ceftazidime sodium in dextrose], Sulfa antibiotics, and Vancomycin  Review of Systems   Review of Systems  Cardiovascular: Positive for palpitations.  All other systems reviewed and are negative.   Physical Exam Updated Vital Signs BP 122/79 (BP Location: Right Arm)   Pulse 72   Temp 98.4 F (36.9 C) (Oral)   Resp 16   Ht '5\' 1"'$  (1.549 m)   Wt 86.2 kg   SpO2 100%   BMI 35.90 kg/m   Physical Exam Vitals and nursing note reviewed.  Constitutional:      Appearance: Normal appearance.     Comments: Chronically ill but not acutely ill  HENT:     Head: Normocephalic.     Nose: Nose normal.     Mouth/Throat:     Mouth: Mucous membranes are moist.  Eyes:     Extraocular Movements: Extraocular movements intact.     Pupils: Pupils are equal, round, and reactive to light.  Cardiovascular:     Rate and Rhythm: Normal rate and regular rhythm.     Pulses: Normal pulses.     Heart sounds: Normal heart sounds.  Pulmonary:     Effort: Pulmonary effort is normal.     Breath sounds: Normal breath sounds.  Abdominal:     General: Abdomen is flat.     Palpations: Abdomen is soft.  Musculoskeletal:        General: Normal range of motion.     Cervical back: Normal range of motion and neck  supple.  Skin:    General: Skin is warm.     Capillary Refill: Capillary refill takes less than 2 seconds.  Neurological:     General: No focal  deficit present.     Mental Status: She is alert and oriented to person, place, and time.  Psychiatric:        Mood and Affect: Mood normal.        Behavior: Behavior normal.     ED Results / Procedures / Treatments   Labs (all labs ordered are listed, but only abnormal results are displayed) Labs Reviewed  BASIC METABOLIC PANEL - Abnormal; Notable for the following components:      Result Value   Chloride 93 (*)    Creatinine, Ser 5.20 (*)    GFR, Estimated 10 (*)    All other components within normal limits  CBC - Abnormal; Notable for the following components:   MCV 100.7 (*)    MCH 35.9 (*)    All other components within normal limits  I-STAT BETA HCG BLOOD, ED ([facility], [facility], [facility] ONLY) - Abnormal; Notable for the following components:   I-stat hCG, quantitative 5.1 (*)    All other components within normal limits  TROPONIN I (HIGH SENSITIVITY)  TROPONIN I (HIGH SENSITIVITY)    EKG EKG Interpretation  Date/Time:  Wednesday December 14 2020 09:51:34 EST Ventricular Rate:  91 PR Interval:  142 QRS Duration: 74 QT Interval:  352 QTC Calculation: 432 R Axis:   -15 Text Interpretation: Normal sinus rhythm Low voltage QRS Cannot rule out Anterior infarct , age undetermined Abnormal ECG No significant change since last tracing Confirmed by Wandra Arthurs [phone]) on 12/14/2020 3:30:31 PM   EKG Interpretation  Date/Time:  Wednesday December 14 2020 15:48:25 EST Ventricular Rate:  72 PR Interval:  142 QRS Duration: 91 QT Interval:  389 QTC Calculation: 426 R Axis:   -24 Text Interpretation: Sinus rhythm Consider right atrial enlargement Borderline left axis deviation Anterior infarct, old unchanged since previous Confirmed by Wandra Arthurs [phone]) on 12/14/2020 3:55:43 PM       Radiology DG Chest 2 View  Result Date:  12/14/2020 CLINICAL DATA:  Chest pain beginning during dialysis EXAM: CHEST - 2 VIEW COMPARISON:  09/17/2016 FINDINGS: The heart size and mediastinal contours are within normal limits. Both lungs are clear. The visualized skeletal structures are unremarkable. IMPRESSION: Negative chest. Electronically Signed   By: Monte Fantasia M.D.   On: 12/14/2020 10:04    Procedures Procedures   Medications Ordered in ED Medications - No data to display  ED Course  I have reviewed the triage vital signs and the nursing notes.  Pertinent labs & imaging results that were available during my care of the patient were reviewed by me and considered in my medical decision making (see chart for details).    MDM Rules/Calculators/A&P                         Tiffany Golden is a 45 y.o. female presenting with possible SVT.  Patient was sinus rhythm on arrival.  Patient was in the ED for about 6 hours in the waiting room.  Patient's electrolytes are baseline and trop neg x 1.  Her repeat EKG showed sinus rhythm and not SVT. Since she is back in sinus rhythm, she is stable for discharge    Final Clinical Impression(s) / ED Diagnoses Final diagnoses:  None    Rx / DC Orders ED Discharge Orders    None       Drenda Freeze, MD 12/14/20 1556

## 2020-12-14 NOTE — ED Triage Notes (Signed)
Pt to triage via [facility] from dialysis.  Pt was 2 hrs 45 min into dialysis and could feel her heart racing.  SVT @ 220 per dialysis.  On EMS arrival HR 110.  800 ml fluid removed during dialysis normally full treatment is 1 liter.  Pt denies any complaints now and didn't want to come to ED but EMS encouraged her to come.

## 2020-12-15 ENCOUNTER — Telehealth: Payer: Self-pay

## 2020-12-15 NOTE — Telephone Encounter (Signed)
Transition Care Management Unsuccessful Follow-up Telephone Call  Date of discharge and from where:  12/14/20 from [facility]  Attempts:  1st Attempt  Reason for unsuccessful TCM follow-up call:  Left voice message

## 2020-12-16 DIAGNOSIS — Z992 Dependence on renal dialysis: Secondary | ICD-10-CM | POA: Diagnosis not present

## 2020-12-16 DIAGNOSIS — D631 Anemia in chronic kidney disease: Secondary | ICD-10-CM | POA: Diagnosis not present

## 2020-12-16 DIAGNOSIS — N186 End stage renal disease: Secondary | ICD-10-CM | POA: Diagnosis not present

## 2020-12-16 DIAGNOSIS — N2581 Secondary hyperparathyroidism of renal origin: Secondary | ICD-10-CM | POA: Diagnosis not present

## 2020-12-16 NOTE — Telephone Encounter (Signed)
Pt called back.   Transition Care Management Follow-up Telephone Call  Date of discharge and from where: 12/14/2020 from [facility]  How have you been since you were released from the hospital? Pt stated that she is feeling much better.   Any questions or concerns? No  Items Reviewed:  Did the pt receive and understand the discharge instructions provided? Yes   Medications obtained and verified? Yes   Other? No   Any new allergies since your discharge? No   Dietary orders reviewed? NA  Do you have support at home? Yes   Functional Questionnaire: (I = Independent and D = Dependent) ADLs: I  Bathing/Dressing- I  Meal Prep- I  Eating- I  Maintaining continence- I  Transferring/Ambulation- I  Managing Meds- I  Follow up appointments reviewed:   [facility] f/u appt confirmed? Yes  Scheduled to see Allegra Lai, MD on 01/02/2021 @ 11:00am.  Are transportation arrangements needed? No   If their condition worsens, is the pt aware to call PCP or go to the Emergency Dept.? Yes  Was the patient provided with contact information for the PCP's office or ED? Yes  Was to pt encouraged to call back with questions or concerns? Yes

## 2020-12-16 NOTE — Telephone Encounter (Signed)
Transition Care Management Unsuccessful Follow-up Telephone Call  Date of discharge and from where:  01/03/2021 from [facility]  Attempts:  2nd Attempt  Reason for unsuccessful TCM follow-up call:  Left voice message

## 2020-12-19 DIAGNOSIS — Z992 Dependence on renal dialysis: Secondary | ICD-10-CM | POA: Diagnosis not present

## 2020-12-19 DIAGNOSIS — N186 End stage renal disease: Secondary | ICD-10-CM | POA: Diagnosis not present

## 2020-12-19 DIAGNOSIS — D631 Anemia in chronic kidney disease: Secondary | ICD-10-CM | POA: Diagnosis not present

## 2020-12-19 DIAGNOSIS — N2581 Secondary hyperparathyroidism of renal origin: Secondary | ICD-10-CM | POA: Diagnosis not present

## 2020-12-21 DIAGNOSIS — D631 Anemia in chronic kidney disease: Secondary | ICD-10-CM | POA: Diagnosis not present

## 2020-12-21 DIAGNOSIS — N186 End stage renal disease: Secondary | ICD-10-CM | POA: Diagnosis not present

## 2020-12-21 DIAGNOSIS — Z992 Dependence on renal dialysis: Secondary | ICD-10-CM | POA: Diagnosis not present

## 2020-12-21 DIAGNOSIS — N2581 Secondary hyperparathyroidism of renal origin: Secondary | ICD-10-CM | POA: Diagnosis not present

## 2020-12-23 DIAGNOSIS — N186 End stage renal disease: Secondary | ICD-10-CM | POA: Diagnosis not present

## 2020-12-23 DIAGNOSIS — N2581 Secondary hyperparathyroidism of renal origin: Secondary | ICD-10-CM | POA: Diagnosis not present

## 2020-12-23 DIAGNOSIS — Z992 Dependence on renal dialysis: Secondary | ICD-10-CM | POA: Diagnosis not present

## 2020-12-23 DIAGNOSIS — D631 Anemia in chronic kidney disease: Secondary | ICD-10-CM | POA: Diagnosis not present

## 2020-12-26 DIAGNOSIS — D631 Anemia in chronic kidney disease: Secondary | ICD-10-CM | POA: Diagnosis not present

## 2020-12-26 DIAGNOSIS — N186 End stage renal disease: Secondary | ICD-10-CM | POA: Diagnosis not present

## 2020-12-26 DIAGNOSIS — N2581 Secondary hyperparathyroidism of renal origin: Secondary | ICD-10-CM | POA: Diagnosis not present

## 2020-12-26 DIAGNOSIS — Z992 Dependence on renal dialysis: Secondary | ICD-10-CM | POA: Diagnosis not present

## 2020-12-28 DIAGNOSIS — N186 End stage renal disease: Secondary | ICD-10-CM | POA: Diagnosis not present

## 2020-12-28 DIAGNOSIS — N2581 Secondary hyperparathyroidism of renal origin: Secondary | ICD-10-CM | POA: Diagnosis not present

## 2020-12-28 DIAGNOSIS — Z992 Dependence on renal dialysis: Secondary | ICD-10-CM | POA: Diagnosis not present

## 2020-12-28 DIAGNOSIS — D631 Anemia in chronic kidney disease: Secondary | ICD-10-CM | POA: Diagnosis not present

## 2020-12-30 DIAGNOSIS — N2581 Secondary hyperparathyroidism of renal origin: Secondary | ICD-10-CM | POA: Diagnosis not present

## 2020-12-30 DIAGNOSIS — N186 End stage renal disease: Secondary | ICD-10-CM | POA: Diagnosis not present

## 2020-12-30 DIAGNOSIS — D631 Anemia in chronic kidney disease: Secondary | ICD-10-CM | POA: Diagnosis not present

## 2020-12-30 DIAGNOSIS — Z992 Dependence on renal dialysis: Secondary | ICD-10-CM | POA: Diagnosis not present

## 2021-01-02 ENCOUNTER — Ambulatory Visit (INDEPENDENT_AMBULATORY_CARE_PROVIDER_SITE_OTHER): Payer: Medicare Other | Admitting: Cardiology

## 2021-01-02 ENCOUNTER — Other Ambulatory Visit: Payer: Self-pay

## 2021-01-02 ENCOUNTER — Encounter: Payer: Self-pay | Admitting: Cardiology

## 2021-01-02 VITALS — BP 116/88 | HR 90 | Ht 61.0 in | Wt 190.0 lb

## 2021-01-02 DIAGNOSIS — I471 Supraventricular tachycardia: Secondary | ICD-10-CM

## 2021-01-02 DIAGNOSIS — N2581 Secondary hyperparathyroidism of renal origin: Secondary | ICD-10-CM | POA: Diagnosis not present

## 2021-01-02 DIAGNOSIS — N186 End stage renal disease: Secondary | ICD-10-CM | POA: Diagnosis not present

## 2021-01-02 DIAGNOSIS — D631 Anemia in chronic kidney disease: Secondary | ICD-10-CM | POA: Diagnosis not present

## 2021-01-02 DIAGNOSIS — Z992 Dependence on renal dialysis: Secondary | ICD-10-CM | POA: Diagnosis not present

## 2021-01-02 MED ORDER — DILTIAZEM HCL 30 MG PO TABS: 30.0000 mg | ORAL_TABLET | Freq: Four times a day (QID) | ORAL | 1 refills | Status: DC | PRN

## 2021-01-02 NOTE — Progress Notes (Signed)
Electrophysiology Office Note   Date:  01/02/2021   ID:  Tiffany Golden, DOB Mar 12, 1976, MRN [id number]  PCP:  Constance Haw, MD  Cardiologist:   Primary Electrophysiologist:  Taylia Berber Meredith Leeds, MD    Chief Complaint: SVT   History of Present Illness: Tiffany Golden is a 45 y.o. female who is being seen today for the evaluation of SVT at the request of Luking, Elayne Snare, MD. Presenting today for electrophysiology evaluation.  She has a history of HIV, end-stage renal disease on dialysis, prior stroke, and TIA. She developed chest pain in dialysis and was noted to be in SVT with heart rates of 220 bpm. She was given adenosine which terminated her SVT. This occurred in 2017.  She was having dialysis 01/01/2021 when she went into SVT.  She developed chest pain, shortness of breath during her episode.  She tried vagal maneuvers which converted her to sinus rhythm.  She did present to the emergency room, but all of her symptoms went away.  She was discharged with plans to follow-up in cardiology clinic.  Today, she denies symptoms of palpitations, chest pain, shortness of breath, orthopnea, PND, lower extremity edema, claudication, dizziness, presyncope, syncope, bleeding, or neurologic sequela. The patient is tolerating medications without difficulties.    Past Medical History:  Diagnosis Date  . Anemia   . Chronic kidney disease   . Complication of anesthesia   . Dialysis patient [facility])    mon, wed 41  . ESRD (end stage renal disease) ([place])   . HIV infection ([place])   . Hypertension   . PONV (postoperative nausea and vomiting)   . Stroke ([place])   . TIA (transient ischemic attack)    Hx:of   Past Surgical History:  Procedure Laterality Date  . ARTERIOVENOUS GRAFT PLACEMENT  09/11/11   left arm  . CESAREAN SECTION    . DILITATION & CURRETTAGE/HYSTROSCOPY WITH NOVASURE ABLATION N/A 05/03/2015   Procedure: DILATATION/HYSTEROSCOPY WITH NOVASURE ABLATION; uterine cavity length  5.0 cm, uterine cavity width 3.8 cm, power 105 watts; time 1 minute 12 seconds;  Surgeon: Jonnie Kind, MD;  Location: [facility];  Service: Gynecology;  Laterality: N/A;  . INSERTION OF DIALYSIS CATHETER Right 09/01/2013   Procedure: INSERTION OF DIALYSIS CATHETER Right Internal Jugular;  Surgeon: Elam Dutch, MD;  Location: [facility];  Service: Vascular;  Laterality: Right;  . PATCH ANGIOPLASTY Left 09/01/2013   Procedure: PATCH ANGIOPLASTY;  Surgeon: Elam Dutch, MD;  Location: [facility];  Service: Vascular;  Laterality: Left;  . REVISON OF ARTERIOVENOUS FISTULA Left [id number]   Procedure: Plication left arm fistula;  Surgeon: Elam Dutch, MD;  Location: [facility] OR;  Service: Vascular;  Laterality: Left;     Current Outpatient Medications  Medication Sig Dispense Refill  . B Complex-C-Folic Acid (RENA-VITE RX) 1 MG TABS Take 1 tablet by mouth daily.    . bictegravir-emtricitabine-tenofovir AF (BIKTARVY) 50-200-25 MG TABS tablet Take 1 tablet by mouth daily. 30 tablet 11  . calcium carbonate (TUMS - DOSED IN MG ELEMENTAL CALCIUM) 500 MG chewable tablet Chew 1-2 tablets by mouth daily as needed for indigestion or heartburn.    . diltiazem (CARDIZEM) 30 MG tablet Take 1 tablet (30 mg total) by mouth every 6 (six) hours as needed (for elevated heart rates). 30 tablet 1  . metoprolol tartrate (LOPRESSOR) 25 MG tablet Take 12.5 mg by mouth 2 (two) times daily as needed (for blood pressure). Except takes none on dialysis days (  Tuesday, Thursday, and Sunday).    . sertraline (ZOLOFT) 100 MG tablet Take 1 tablet (100 mg total) by mouth daily. 30 tablet 5   No current facility-administered medications for this visit.    Allergies:   Fortaz [ceftazidime sodium in dextrose], Sulfa antibiotics, and Vancomycin   Social History:  The patient  reports that she quit smoking about 11 years ago. Her smoking use included cigarettes. She has a 1.50 pack-year smoking history. She has never used smokeless  tobacco. She reports that she does not drink alcohol and does not use drugs.   Family History:  The patient's family history includes Cancer - Other in her mother; Heart disease in her father; Hyperlipidemia in her mother.    ROS:  Please see the history of present illness.   Otherwise, review of systems is positive for none.   All other systems are reviewed and negative.    PHYSICAL EXAM: VS:  BP 116/88   Pulse 90   Ht '5\' 1"'$  (1.549 m)   Wt 190 lb (86.2 kg)   SpO2 98%   BMI 35.90 kg/m  , BMI Body mass index is 35.9 kg/m. GEN: Well nourished, well developed, in no acute distress  HEENT: normal  Neck: no JVD, carotid bruits, or masses Cardiac: RRR; no murmurs, rubs, or gallops,no edema  Respiratory:  clear to auscultation bilaterally, normal work of breathing GI: soft, nontender, nondistended, + BS MS: no deformity or atrophy  Skin: warm and dry Neuro:  Strength and sensation are intact Psych: euthymic mood, full affect  EKG:  EKG is not ordered today. Personal review of the ekg ordered 12/14/20 shows sinus rhythm, rate 91  Recent Labs: 12/14/2020: BUN 13; Creatinine, Ser 5.20; Hemoglobin 14.6; Platelets 220; Potassium 4.1; Sodium 136    Lipid Panel     Component Value Date/Time   CHOL 200 (H) 12/29/2019 0856   CHOL 184 01/30/2018 1058   TRIG 92 12/29/2019 0856   HDL 46 (L) 12/29/2019 0856   HDL 48 01/30/2018 1058   CHOLHDL 4.3 12/29/2019 0856   VLDL 21 06/30/2015 1027   LDLCALC 134 (H) 12/29/2019 0856     Wt Readings from Last 3 Encounters:  01/02/21 190 lb (86.2 kg)  12/14/20 190 lb (86.2 kg)  10/25/20 189 lb (85.7 kg)      Other studies Reviewed: Additional studies/ records that were reviewed today include: TTE 2015  Review of the above records today demonstrates:  - Left ventricle: The cavity size was normal. Wall thickness was  normal. Systolic function was normal. The estimated ejection  fraction was in the range of 55% to 60%.  - Mitral valve: There  was mild regurgitation.      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  She had an episode right after dialysis.  It lasted for approximately 5 minutes.  She did not have an episode prior to that for a few years.  She would like to try to avoid daily medications.  We Lourdes Manning start diltiazem 30 mg to take as needed.    Current medicines are reviewed at length with the patient today.   The patient does not have concerns regarding her medicines.  The following changes were made today: Start as needed diltiazem  Labs/ tests ordered today include:  No orders of the defined types were placed in this encounter.    Disposition:   FU with Landen Bre[facility] 6 months  Signed, Relena Ivancic Meredith Leeds, MD  01/02/2021 11:17 AM  [place] [id number] [phone] (office) [phone] (fax)

## 2021-01-02 NOTE — Patient Instructions (Signed)
Medication Instructions:  Your physician has recommended you make the following change in your medication:  1. START Diltiazem 30 mg every 6 hours as needed for elevated heart rates  *If you need a refill on your cardiac medications before your next appointment, please call your pharmacy*   Lab Work: None ordered   Testing/Procedures: None ordered   Follow-Up: At [facility], you and your health needs are our priority.  As part of our continuing mission to provide you with exceptional heart care, we have created designated Provider Care Teams.  These Care Teams include your primary Cardiologist (physician) and Advanced Practice Providers (APPs -  Physician Assistants and Nurse Practitioners) who all work together to provide you with the care you need, when you need it.   Your next appointment:   6 month(s)  The format for your next appointment:   In Person  Provider:   Allegra Lai, MD    Thank you for choosing [facility]!!   Trinidad Curet, RN [phone]   Other Instructions

## 2021-01-04 DIAGNOSIS — D631 Anemia in chronic kidney disease: Secondary | ICD-10-CM | POA: Diagnosis not present

## 2021-01-04 DIAGNOSIS — N2581 Secondary hyperparathyroidism of renal origin: Secondary | ICD-10-CM | POA: Diagnosis not present

## 2021-01-04 DIAGNOSIS — N186 End stage renal disease: Secondary | ICD-10-CM | POA: Diagnosis not present

## 2021-01-04 DIAGNOSIS — Z992 Dependence on renal dialysis: Secondary | ICD-10-CM | POA: Diagnosis not present

## 2021-01-06 DIAGNOSIS — Z992 Dependence on renal dialysis: Secondary | ICD-10-CM | POA: Diagnosis not present

## 2021-01-06 DIAGNOSIS — D631 Anemia in chronic kidney disease: Secondary | ICD-10-CM | POA: Diagnosis not present

## 2021-01-06 DIAGNOSIS — N186 End stage renal disease: Secondary | ICD-10-CM | POA: Diagnosis not present

## 2021-01-06 DIAGNOSIS — N2581 Secondary hyperparathyroidism of renal origin: Secondary | ICD-10-CM | POA: Diagnosis not present

## 2021-01-09 DIAGNOSIS — N2581 Secondary hyperparathyroidism of renal origin: Secondary | ICD-10-CM | POA: Diagnosis not present

## 2021-01-09 DIAGNOSIS — N186 End stage renal disease: Secondary | ICD-10-CM | POA: Diagnosis not present

## 2021-01-09 DIAGNOSIS — Z992 Dependence on renal dialysis: Secondary | ICD-10-CM | POA: Diagnosis not present

## 2021-01-09 DIAGNOSIS — D631 Anemia in chronic kidney disease: Secondary | ICD-10-CM | POA: Diagnosis not present

## 2021-01-09 MED FILL — BIKTARVY 50-200-25 MG TABS: 50-200-25 | 30 days supply | Qty: 30 | Fill #8

## 2021-01-10 DIAGNOSIS — Z992 Dependence on renal dialysis: Secondary | ICD-10-CM | POA: Diagnosis not present

## 2021-01-10 DIAGNOSIS — I120 Hypertensive chronic kidney disease with stage 5 chronic kidney disease or end stage renal disease: Secondary | ICD-10-CM | POA: Diagnosis not present

## 2021-01-10 DIAGNOSIS — N186 End stage renal disease: Secondary | ICD-10-CM | POA: Diagnosis not present

## 2021-01-11 DIAGNOSIS — N186 End stage renal disease: Secondary | ICD-10-CM | POA: Diagnosis not present

## 2021-01-11 DIAGNOSIS — Z992 Dependence on renal dialysis: Secondary | ICD-10-CM | POA: Diagnosis not present

## 2021-01-11 DIAGNOSIS — D631 Anemia in chronic kidney disease: Secondary | ICD-10-CM | POA: Diagnosis not present

## 2021-01-11 DIAGNOSIS — N2581 Secondary hyperparathyroidism of renal origin: Secondary | ICD-10-CM | POA: Diagnosis not present

## 2021-01-12 DIAGNOSIS — Z992 Dependence on renal dialysis: Secondary | ICD-10-CM | POA: Diagnosis not present

## 2021-01-12 DIAGNOSIS — N186 End stage renal disease: Secondary | ICD-10-CM | POA: Diagnosis not present

## 2021-01-12 DIAGNOSIS — N2581 Secondary hyperparathyroidism of renal origin: Secondary | ICD-10-CM | POA: Diagnosis not present

## 2021-01-12 DIAGNOSIS — D631 Anemia in chronic kidney disease: Secondary | ICD-10-CM | POA: Diagnosis not present

## 2021-01-16 DIAGNOSIS — N2581 Secondary hyperparathyroidism of renal origin: Secondary | ICD-10-CM | POA: Diagnosis not present

## 2021-01-16 DIAGNOSIS — Z992 Dependence on renal dialysis: Secondary | ICD-10-CM | POA: Diagnosis not present

## 2021-01-16 DIAGNOSIS — D631 Anemia in chronic kidney disease: Secondary | ICD-10-CM | POA: Diagnosis not present

## 2021-01-16 DIAGNOSIS — N186 End stage renal disease: Secondary | ICD-10-CM | POA: Diagnosis not present

## 2021-01-18 DIAGNOSIS — D631 Anemia in chronic kidney disease: Secondary | ICD-10-CM | POA: Diagnosis not present

## 2021-01-18 DIAGNOSIS — Z992 Dependence on renal dialysis: Secondary | ICD-10-CM | POA: Diagnosis not present

## 2021-01-18 DIAGNOSIS — N186 End stage renal disease: Secondary | ICD-10-CM | POA: Diagnosis not present

## 2021-01-18 DIAGNOSIS — N2581 Secondary hyperparathyroidism of renal origin: Secondary | ICD-10-CM | POA: Diagnosis not present

## 2021-01-20 DIAGNOSIS — D631 Anemia in chronic kidney disease: Secondary | ICD-10-CM | POA: Diagnosis not present

## 2021-01-20 DIAGNOSIS — N186 End stage renal disease: Secondary | ICD-10-CM | POA: Diagnosis not present

## 2021-01-20 DIAGNOSIS — Z992 Dependence on renal dialysis: Secondary | ICD-10-CM | POA: Diagnosis not present

## 2021-01-20 DIAGNOSIS — N2581 Secondary hyperparathyroidism of renal origin: Secondary | ICD-10-CM | POA: Diagnosis not present

## 2021-01-23 DIAGNOSIS — N2581 Secondary hyperparathyroidism of renal origin: Secondary | ICD-10-CM | POA: Diagnosis not present

## 2021-01-23 DIAGNOSIS — D631 Anemia in chronic kidney disease: Secondary | ICD-10-CM | POA: Diagnosis not present

## 2021-01-23 DIAGNOSIS — N186 End stage renal disease: Secondary | ICD-10-CM | POA: Diagnosis not present

## 2021-01-23 DIAGNOSIS — Z992 Dependence on renal dialysis: Secondary | ICD-10-CM | POA: Diagnosis not present

## 2021-01-24 ENCOUNTER — Other Ambulatory Visit: Payer: Medicare Other

## 2021-01-24 ENCOUNTER — Other Ambulatory Visit: Payer: Self-pay

## 2021-01-24 DIAGNOSIS — N186 End stage renal disease: Secondary | ICD-10-CM | POA: Diagnosis not present

## 2021-01-24 DIAGNOSIS — B20 Human immunodeficiency virus [HIV] disease: Secondary | ICD-10-CM

## 2021-01-24 DIAGNOSIS — Z992 Dependence on renal dialysis: Secondary | ICD-10-CM | POA: Diagnosis not present

## 2021-01-25 DIAGNOSIS — N186 End stage renal disease: Secondary | ICD-10-CM | POA: Diagnosis not present

## 2021-01-25 DIAGNOSIS — Z992 Dependence on renal dialysis: Secondary | ICD-10-CM | POA: Diagnosis not present

## 2021-01-25 DIAGNOSIS — N2581 Secondary hyperparathyroidism of renal origin: Secondary | ICD-10-CM | POA: Diagnosis not present

## 2021-01-25 DIAGNOSIS — D631 Anemia in chronic kidney disease: Secondary | ICD-10-CM | POA: Diagnosis not present

## 2021-01-25 LAB — T-HELPER CELL (CD4) - (RCID CLINIC ONLY)
CD4 % Helper T Cell: 27 % — ABNORMAL LOW (ref 33–65)
CD4 T Cell Abs: 458 /uL (ref 400–1790)

## 2021-01-27 DIAGNOSIS — N186 End stage renal disease: Secondary | ICD-10-CM | POA: Diagnosis not present

## 2021-01-27 DIAGNOSIS — D631 Anemia in chronic kidney disease: Secondary | ICD-10-CM | POA: Diagnosis not present

## 2021-01-27 DIAGNOSIS — N2581 Secondary hyperparathyroidism of renal origin: Secondary | ICD-10-CM | POA: Diagnosis not present

## 2021-01-27 DIAGNOSIS — Z992 Dependence on renal dialysis: Secondary | ICD-10-CM | POA: Diagnosis not present

## 2021-01-28 LAB — CBC WITH DIFFERENTIAL/PLATELET
Absolute Monocytes: 435 cells/uL (ref 200–950)
Basophils Absolute: 69 cells/uL (ref 0–200)
Basophils Relative: 1 %
Eosinophils Absolute: 228 cells/uL (ref 15–500)
Eosinophils Relative: 3.3 %
HCT: 39.6 % (ref 35.0–45.0)
Hemoglobin: 13.6 g/dL (ref 11.7–15.5)
Lymphs Abs: 1773 cells/uL (ref 850–3900)
MCH: 34.4 pg — ABNORMAL HIGH (ref 27.0–33.0)
MCHC: 34.3 g/dL (ref 32.0–36.0)
MCV: 100.3 fL — ABNORMAL HIGH (ref 80.0–100.0)
MPV: 10.1 fL (ref 7.5–12.5)
Monocytes Relative: 6.3 %
Neutro Abs: 4395 cells/uL (ref 1500–7800)
Neutrophils Relative %: 63.7 %
Platelets: 222 10*3/uL (ref 140–400)
RBC: 3.95 10*6/uL (ref 3.80–5.10)
RDW: 13 % (ref 11.0–15.0)
Total Lymphocyte: 25.7 %
WBC: 6.9 10*3/uL (ref 3.8–10.8)

## 2021-01-28 LAB — HIV-1 RNA QUANT-NO REFLEX-BLD
HIV 1 RNA Quant: NOT DETECTED Copies/mL
HIV-1 RNA Quant, Log: NOT DETECTED Log cps/mL

## 2021-01-28 LAB — RPR: RPR Ser Ql: NONREACTIVE

## 2021-01-30 DIAGNOSIS — D631 Anemia in chronic kidney disease: Secondary | ICD-10-CM | POA: Diagnosis not present

## 2021-01-30 DIAGNOSIS — N2581 Secondary hyperparathyroidism of renal origin: Secondary | ICD-10-CM | POA: Diagnosis not present

## 2021-01-30 DIAGNOSIS — N186 End stage renal disease: Secondary | ICD-10-CM | POA: Diagnosis not present

## 2021-01-30 DIAGNOSIS — Z992 Dependence on renal dialysis: Secondary | ICD-10-CM | POA: Diagnosis not present

## 2021-02-01 ENCOUNTER — Other Ambulatory Visit: Payer: Self-pay | Admitting: Infectious Disease

## 2021-02-01 DIAGNOSIS — Z992 Dependence on renal dialysis: Secondary | ICD-10-CM | POA: Diagnosis not present

## 2021-02-01 DIAGNOSIS — D631 Anemia in chronic kidney disease: Secondary | ICD-10-CM | POA: Diagnosis not present

## 2021-02-01 DIAGNOSIS — N2581 Secondary hyperparathyroidism of renal origin: Secondary | ICD-10-CM | POA: Diagnosis not present

## 2021-02-01 DIAGNOSIS — N186 End stage renal disease: Secondary | ICD-10-CM | POA: Diagnosis not present

## 2021-02-02 ENCOUNTER — Other Ambulatory Visit: Payer: Self-pay | Admitting: Infectious Disease

## 2021-02-03 DIAGNOSIS — Z992 Dependence on renal dialysis: Secondary | ICD-10-CM | POA: Diagnosis not present

## 2021-02-03 DIAGNOSIS — D631 Anemia in chronic kidney disease: Secondary | ICD-10-CM | POA: Diagnosis not present

## 2021-02-03 DIAGNOSIS — N186 End stage renal disease: Secondary | ICD-10-CM | POA: Diagnosis not present

## 2021-02-03 DIAGNOSIS — N2581 Secondary hyperparathyroidism of renal origin: Secondary | ICD-10-CM | POA: Diagnosis not present

## 2021-02-06 DIAGNOSIS — N2581 Secondary hyperparathyroidism of renal origin: Secondary | ICD-10-CM | POA: Diagnosis not present

## 2021-02-06 DIAGNOSIS — D631 Anemia in chronic kidney disease: Secondary | ICD-10-CM | POA: Diagnosis not present

## 2021-02-06 DIAGNOSIS — Z992 Dependence on renal dialysis: Secondary | ICD-10-CM | POA: Diagnosis not present

## 2021-02-06 DIAGNOSIS — N186 End stage renal disease: Secondary | ICD-10-CM | POA: Diagnosis not present

## 2021-02-07 ENCOUNTER — Other Ambulatory Visit: Payer: Self-pay

## 2021-02-07 ENCOUNTER — Encounter: Payer: Self-pay | Admitting: Infectious Disease

## 2021-02-07 ENCOUNTER — Other Ambulatory Visit (HOSPITAL_COMMUNITY): Payer: Self-pay | Admitting: Adult Health

## 2021-02-07 ENCOUNTER — Ambulatory Visit (INDEPENDENT_AMBULATORY_CARE_PROVIDER_SITE_OTHER): Payer: Medicare Other | Admitting: Infectious Disease

## 2021-02-07 VITALS — BP 130/83 | HR 67 | Temp 97.5°F | Ht 61.0 in | Wt 189.0 lb

## 2021-02-07 DIAGNOSIS — F321 Major depressive disorder, single episode, moderate: Secondary | ICD-10-CM | POA: Diagnosis not present

## 2021-02-07 DIAGNOSIS — Z7682 Awaiting organ transplant status: Secondary | ICD-10-CM | POA: Diagnosis not present

## 2021-02-07 DIAGNOSIS — N186 End stage renal disease: Secondary | ICD-10-CM | POA: Diagnosis not present

## 2021-02-07 DIAGNOSIS — B20 Human immunodeficiency virus [HIV] disease: Secondary | ICD-10-CM | POA: Diagnosis not present

## 2021-02-07 DIAGNOSIS — Z992 Dependence on renal dialysis: Secondary | ICD-10-CM

## 2021-02-07 DIAGNOSIS — Z1231 Encounter for screening mammogram for malignant neoplasm of breast: Secondary | ICD-10-CM

## 2021-02-07 MED FILL — BIKTARVY 50-200-25 MG TABS: 50-200-25 | 30 days supply | Qty: 30 | Fill #0

## 2021-02-07 NOTE — Progress Notes (Signed)
Subjective:   Chief complaint: Follow-up for her HIV    Patient ID: Tiffany Golden, female    DOB: Apr 06, 1976, 45 y.o.   MRN: [id number]  HPI 45 y.o. female past medical history significant for HIV and AIDS that was diagnosed approximately 20 years ago.   She has been on various antiretroviral regimens and has had illogical failure with resistance.  She has maintained virological suppression on Biktarvy which has 2 active drugs.  She has diabetes mellitus with chronic kidney disease and in fact end-stage renal disease on hemodialysis and is on the transplant list at [facility].  I discussed getting a Product/process development scientist today.      Past Medical History:  Diagnosis Date  . Anemia   . Chronic kidney disease   . Complication of anesthesia   . Dialysis patient [facility])    mon, wed 68  . ESRD (end stage renal disease) ([place])   . HIV infection ([place])   . Hypertension   . PONV (postoperative nausea and vomiting)   . Stroke ([place])   . TIA (transient ischemic attack)    Hx:of    Past Surgical History:  Procedure Laterality Date  . ARTERIOVENOUS GRAFT PLACEMENT  09/11/11   left arm  . CESAREAN SECTION    . DILITATION & CURRETTAGE/HYSTROSCOPY WITH NOVASURE ABLATION N/A 05/03/2015   Procedure: DILATATION/HYSTEROSCOPY WITH NOVASURE ABLATION; uterine cavity length 5.0 cm, uterine cavity width 3.8 cm, power 105 watts; time 1 minute 12 seconds;  Surgeon: Jonnie Kind, MD;  Location: [facility];  Service: Gynecology;  Laterality: N/A;  . INSERTION OF DIALYSIS CATHETER Right 09/01/2013   Procedure: INSERTION OF DIALYSIS CATHETER Right Internal Jugular;  Surgeon: Elam Dutch, MD;  Location: [place];  Service: Vascular;  Laterality: Right;  . PATCH ANGIOPLASTY Left 09/01/2013   Procedure: PATCH ANGIOPLASTY;  Surgeon: Elam Dutch, MD;  Location: [place];  Service: Vascular;  Laterality: Left;  . REVISON OF ARTERIOVENOUS FISTULA Left [id number]   Procedure: Plication left arm fistula;   Surgeon: Elam Dutch, MD;  Location: [facility] OR;  Service: Vascular;  Laterality: Left;    Family History  Problem Relation Age of Onset  . Hyperlipidemia Mother   . Cancer - Other Mother        Hx partial hysterectomy  . Heart disease Father        Hx CABG      Social History   Socioeconomic History  . Marital status: Single    Spouse name: Not on file  . Number of children: Not on file  . Years of education: Not on file  . Highest education level: Not on file  Occupational History  . Not on file  Tobacco Use  . Smoking status: Former Smoker    Packs/day: 0.50    Years: 3.00    Pack years: 1.50    Types: Cigarettes    Quit date: 09/05/2009    Years since quitting: 11.4  . Smokeless tobacco: Never Used  Vaping Use  . Vaping Use: Never used  Substance and Sexual Activity  . Alcohol use: No  . Drug use: No  . Sexual activity: Not Currently    Partners: Male    Comment: declined condoms  Other Topics Concern  . Not on file  Social History Narrative  . Not on file   Social Determinants of Health   Financial Resource Strain: Not on file  Food Insecurity: Not on file  Transportation Needs: Not on file  Physical Activity: Not on file  Stress: Not on file  Social Connections: Not on file    Allergies  Allergen Reactions  . Fortaz [Ceftazidime Sodium In Dextrose] Rash    Head-toe  . Sulfa Antibiotics Hives  . Vancomycin Rash    Head-toe     Current Outpatient Medications:  .  B Complex-C-Folic Acid (RENA-VITE RX) 1 MG TABS, Take 1 tablet by mouth daily., Disp: , Rfl:  .  BIKTARVY 50-200-25 MG TABS tablet, TAKE 1 TABLET BY MOUTH DAILY., Disp: 30 tablet, Rfl: 0 .  calcium carbonate (TUMS - DOSED IN MG ELEMENTAL CALCIUM) 500 MG chewable tablet, Chew 1-2 tablets by mouth daily as needed for indigestion or heartburn., Disp: , Rfl:  .  diltiazem (CARDIZEM) 30 MG tablet, Take 1 tablet (30 mg total) by mouth every 6 (six) hours as needed (for elevated heart  rates)., Disp: 30 tablet, Rfl: 1 .  metoprolol tartrate (LOPRESSOR) 25 MG tablet, Take 12.5 mg by mouth 2 (two) times daily as needed (for blood pressure). Except takes none on dialysis days (Tuesday, Thursday, and Sunday)., Disp: , Rfl:  .  sertraline (ZOLOFT) 100 MG tablet, Take 1 tablet (100 mg total) by mouth daily., Disp: 30 tablet, Rfl: 5    Review of Systems  Constitutional: Negative for activity change, appetite change, chills, diaphoresis, fatigue, fever and unexpected weight change.  HENT: Negative for congestion, rhinorrhea, sinus pressure, sneezing, sore throat and trouble swallowing.   Eyes: Negative for photophobia and visual disturbance.  Respiratory: Negative for cough, chest tightness, shortness of breath, wheezing and stridor.   Cardiovascular: Negative for chest pain, palpitations and leg swelling.  Gastrointestinal: Negative for abdominal distention, abdominal pain, anal bleeding, blood in stool, constipation, diarrhea, nausea and vomiting.  Genitourinary: Negative for decreased urine volume.  Musculoskeletal: Negative for arthralgias, back pain, gait problem, joint swelling and myalgias.  Skin: Negative for color change, pallor, rash and wound.  Neurological: Negative for dizziness, tremors, weakness and light-headedness.  Hematological: Negative for adenopathy. Does not bruise/bleed easily.  Psychiatric/Behavioral: Negative for agitation, behavioral problems, confusion, decreased concentration, self-injury, sleep disturbance and suicidal ideas.       Objective:   Physical Exam Constitutional:      General: She is not in acute distress.    Appearance: She is well-developed. She is not diaphoretic.  HENT:     Head: Normocephalic and atraumatic.     Mouth/Throat:     Pharynx: No oropharyngeal exudate.  Eyes:     General: No scleral icterus.    Conjunctiva/sclera: Conjunctivae normal.  Cardiovascular:     Rate and Rhythm: Normal rate and regular rhythm.   Pulmonary:     Effort: Pulmonary effort is normal. No respiratory distress.     Breath sounds: No wheezing.  Abdominal:     General: There is no distension.  Musculoskeletal:        General: No tenderness or deformity.     Cervical back: Normal range of motion and neck supple.  Skin:    General: Skin is warm and dry.     Coloration: Skin is not pale.     Findings: No erythema or rash.  Neurological:     General: No focal deficit present.     Mental Status: She is alert and oriented to person, place, and time.     Coordination: Coordination normal.  Psychiatric:        Mood and Affect: Mood normal. Mood is not depressed.  Behavior: Behavior normal.        Thought Content: Thought content normal.        Judgment: Judgment normal.           Assessment & Plan:  HIV and AIDS:  Continue Biktarvy check general sure archive to see if she has NNRTI resistance at all might build to get her onto a regimen of Tivicay and Pifeltro or Juluca IF her transplant MDs do not want her on TAF    Hypertension : BP  much better controlled Vitals:   02/07/21 0923  BP: 130/83  Pulse: 67  Temp: (!) 97.5 F (36.4 C)  SpO2: 97%     ESRD on HD: --continue HD   Depression on SSRI

## 2021-02-08 DIAGNOSIS — D631 Anemia in chronic kidney disease: Secondary | ICD-10-CM | POA: Diagnosis not present

## 2021-02-08 DIAGNOSIS — Z992 Dependence on renal dialysis: Secondary | ICD-10-CM | POA: Diagnosis not present

## 2021-02-08 DIAGNOSIS — N2581 Secondary hyperparathyroidism of renal origin: Secondary | ICD-10-CM | POA: Diagnosis not present

## 2021-02-08 DIAGNOSIS — N186 End stage renal disease: Secondary | ICD-10-CM | POA: Diagnosis not present

## 2021-02-09 ENCOUNTER — Other Ambulatory Visit: Payer: Medicare Other | Admitting: Adult Health

## 2021-02-10 ENCOUNTER — Other Ambulatory Visit (HOSPITAL_COMMUNITY): Payer: Self-pay

## 2021-02-10 DIAGNOSIS — I120 Hypertensive chronic kidney disease with stage 5 chronic kidney disease or end stage renal disease: Secondary | ICD-10-CM | POA: Diagnosis not present

## 2021-02-10 DIAGNOSIS — N186 End stage renal disease: Secondary | ICD-10-CM | POA: Diagnosis not present

## 2021-02-10 DIAGNOSIS — D631 Anemia in chronic kidney disease: Secondary | ICD-10-CM | POA: Diagnosis not present

## 2021-02-10 DIAGNOSIS — Z992 Dependence on renal dialysis: Secondary | ICD-10-CM | POA: Diagnosis not present

## 2021-02-10 DIAGNOSIS — N2581 Secondary hyperparathyroidism of renal origin: Secondary | ICD-10-CM | POA: Diagnosis not present

## 2021-02-13 DIAGNOSIS — N186 End stage renal disease: Secondary | ICD-10-CM | POA: Diagnosis not present

## 2021-02-13 DIAGNOSIS — D631 Anemia in chronic kidney disease: Secondary | ICD-10-CM | POA: Diagnosis not present

## 2021-02-13 DIAGNOSIS — N2581 Secondary hyperparathyroidism of renal origin: Secondary | ICD-10-CM | POA: Diagnosis not present

## 2021-02-13 DIAGNOSIS — Z992 Dependence on renal dialysis: Secondary | ICD-10-CM | POA: Diagnosis not present

## 2021-02-15 DIAGNOSIS — Z992 Dependence on renal dialysis: Secondary | ICD-10-CM | POA: Diagnosis not present

## 2021-02-15 DIAGNOSIS — D631 Anemia in chronic kidney disease: Secondary | ICD-10-CM | POA: Diagnosis not present

## 2021-02-15 DIAGNOSIS — N186 End stage renal disease: Secondary | ICD-10-CM | POA: Diagnosis not present

## 2021-02-15 DIAGNOSIS — N2581 Secondary hyperparathyroidism of renal origin: Secondary | ICD-10-CM | POA: Diagnosis not present

## 2021-02-17 DIAGNOSIS — N2581 Secondary hyperparathyroidism of renal origin: Secondary | ICD-10-CM | POA: Diagnosis not present

## 2021-02-17 DIAGNOSIS — D631 Anemia in chronic kidney disease: Secondary | ICD-10-CM | POA: Diagnosis not present

## 2021-02-17 DIAGNOSIS — N186 End stage renal disease: Secondary | ICD-10-CM | POA: Diagnosis not present

## 2021-02-17 DIAGNOSIS — Z992 Dependence on renal dialysis: Secondary | ICD-10-CM | POA: Diagnosis not present

## 2021-02-20 DIAGNOSIS — N2581 Secondary hyperparathyroidism of renal origin: Secondary | ICD-10-CM | POA: Diagnosis not present

## 2021-02-20 DIAGNOSIS — N186 End stage renal disease: Secondary | ICD-10-CM | POA: Diagnosis not present

## 2021-02-20 DIAGNOSIS — D631 Anemia in chronic kidney disease: Secondary | ICD-10-CM | POA: Diagnosis not present

## 2021-02-20 DIAGNOSIS — Z992 Dependence on renal dialysis: Secondary | ICD-10-CM | POA: Diagnosis not present

## 2021-02-22 DIAGNOSIS — N186 End stage renal disease: Secondary | ICD-10-CM | POA: Diagnosis not present

## 2021-02-22 DIAGNOSIS — Z992 Dependence on renal dialysis: Secondary | ICD-10-CM | POA: Diagnosis not present

## 2021-02-22 DIAGNOSIS — N2581 Secondary hyperparathyroidism of renal origin: Secondary | ICD-10-CM | POA: Diagnosis not present

## 2021-02-22 DIAGNOSIS — D631 Anemia in chronic kidney disease: Secondary | ICD-10-CM | POA: Diagnosis not present

## 2021-02-23 ENCOUNTER — Encounter (HOSPITAL_COMMUNITY): Payer: Self-pay | Admitting: Vascular Surgery

## 2021-02-23 ENCOUNTER — Other Ambulatory Visit: Payer: Self-pay

## 2021-02-23 ENCOUNTER — Ambulatory Visit (HOSPITAL_COMMUNITY): Payer: Medicare Other | Admitting: Anesthesiology

## 2021-02-23 ENCOUNTER — Ambulatory Visit (INDEPENDENT_AMBULATORY_CARE_PROVIDER_SITE_OTHER): Payer: Medicare Other | Admitting: Vascular Surgery

## 2021-02-23 ENCOUNTER — Ambulatory Visit (HOSPITAL_COMMUNITY)
Admission: RE | Admit: 2021-02-23 | Discharge: 2021-02-23 | Disposition: A | Discharge status: Home / Self Care | Payer: Medicare Other | Source: Ambulatory Visit | Attending: Vascular Surgery | Admitting: Vascular Surgery

## 2021-02-23 ENCOUNTER — Encounter (HOSPITAL_COMMUNITY)
Admission: RE | Disposition: A | Discharge status: Home / Self Care | Payer: Self-pay | Source: Ambulatory Visit | Attending: Vascular Surgery

## 2021-02-23 ENCOUNTER — Encounter: Payer: Self-pay | Admitting: Vascular Surgery

## 2021-02-23 VITALS — BP 117/79 | HR 76 | Temp 98.1°F | Resp 20 | Ht 61.0 in | Wt 187.0 lb

## 2021-02-23 DIAGNOSIS — T82510A Breakdown (mechanical) of surgically created arteriovenous fistula, initial encounter: Secondary | ICD-10-CM | POA: Diagnosis not present

## 2021-02-23 DIAGNOSIS — Z20822 Contact with and (suspected) exposure to covid-19: Secondary | ICD-10-CM | POA: Diagnosis not present

## 2021-02-23 DIAGNOSIS — Z87891 Personal history of nicotine dependence: Secondary | ICD-10-CM | POA: Diagnosis not present

## 2021-02-23 DIAGNOSIS — B20 Human immunodeficiency virus [HIV] disease: Secondary | ICD-10-CM | POA: Insufficient documentation

## 2021-02-23 DIAGNOSIS — Z882 Allergy status to sulfonamides status: Secondary | ICD-10-CM | POA: Diagnosis not present

## 2021-02-23 DIAGNOSIS — N186 End stage renal disease: Secondary | ICD-10-CM

## 2021-02-23 DIAGNOSIS — Z8673 Personal history of transient ischemic attack (TIA), and cerebral infarction without residual deficits: Secondary | ICD-10-CM | POA: Insufficient documentation

## 2021-02-23 DIAGNOSIS — D631 Anemia in chronic kidney disease: Secondary | ICD-10-CM | POA: Diagnosis not present

## 2021-02-23 DIAGNOSIS — Z881 Allergy status to other antibiotic agents status: Secondary | ICD-10-CM | POA: Diagnosis not present

## 2021-02-23 DIAGNOSIS — Z992 Dependence on renal dialysis: Secondary | ICD-10-CM | POA: Insufficient documentation

## 2021-02-23 DIAGNOSIS — Y838 Other surgical procedures as the cause of abnormal reaction of the patient, or of later complication, without mention of misadventure at the time of the procedure: Secondary | ICD-10-CM | POA: Insufficient documentation

## 2021-02-23 DIAGNOSIS — T82898A Other specified complication of vascular prosthetic devices, implants and grafts, initial encounter: Secondary | ICD-10-CM | POA: Diagnosis not present

## 2021-02-23 DIAGNOSIS — Z79899 Other long term (current) drug therapy: Secondary | ICD-10-CM | POA: Diagnosis not present

## 2021-02-23 DIAGNOSIS — I120 Hypertensive chronic kidney disease with stage 5 chronic kidney disease or end stage renal disease: Secondary | ICD-10-CM | POA: Diagnosis not present

## 2021-02-23 HISTORY — PX: FISTULA SUPERFICIALIZATION: SHX6341

## 2021-02-23 LAB — POCT I-STAT, CHEM 8
BUN: 34 mg/dL — ABNORMAL HIGH (ref 6–20)
Calcium, Ion: 1.1 mmol/L — ABNORMAL LOW (ref 1.15–1.40)
Chloride: 95 mmol/L — ABNORMAL LOW (ref 98–111)
Creatinine, Ser: 8 mg/dL — ABNORMAL HIGH (ref 0.44–1.00)
Glucose, Bld: 82 mg/dL (ref 70–99)
HCT: 43 % (ref 36.0–46.0)
Hemoglobin: 14.6 g/dL (ref 12.0–15.0)
Potassium: 5.1 mmol/L (ref 3.5–5.1)
Sodium: 136 mmol/L (ref 135–145)
TCO2: 33 mmol/L — ABNORMAL HIGH (ref 22–32)

## 2021-02-23 LAB — SARS CORONAVIRUS 2 BY RT PCR (HOSPITAL ORDER, PERFORMED IN ~~LOC~~ HOSPITAL LAB): SARS Coronavirus 2: NEGATIVE

## 2021-02-23 LAB — HCG, SERUM, QUALITATIVE: Preg, Serum: NEGATIVE

## 2021-02-23 SURGERY — FISTULA SUPERFICIALIZATION
Anesthesia: Monitor Anesthesia Care | Site: Arm Upper | Laterality: Left

## 2021-02-23 MED ORDER — SODIUM CHLORIDE 0.9 % IV SOLN
INTRAVENOUS | Status: AC
  Filled 2021-02-23: qty 1.2

## 2021-02-23 MED ORDER — SODIUM CHLORIDE 0.9 % IV SOLN
INTRAVENOUS | Status: DC
  Administered 2021-02-23: 10 mL/h via INTRAVENOUS

## 2021-02-23 MED ORDER — CHLORHEXIDINE GLUCONATE 0.12 % MT SOLN: 15.0000 mL | Freq: Once | OROMUCOSAL | Status: AC

## 2021-02-23 MED ORDER — MIDAZOLAM HCL 5 MG/5ML IJ SOLN
INTRAMUSCULAR | Status: DC | PRN
  Administered 2021-02-23: 2 mg via INTRAVENOUS

## 2021-02-23 MED ORDER — OXYCODONE HCL 5 MG PO TABS
5.0000 mg | ORAL_TABLET | Freq: Once | ORAL | Status: DC | PRN
Start: 2021-02-23 — End: 2021-02-23

## 2021-02-23 MED ORDER — ORAL CARE MOUTH RINSE: 15.0000 mL | Freq: Once | OROMUCOSAL | Status: DC

## 2021-02-23 MED ORDER — CIPROFLOXACIN IN D5W 400 MG/200ML IV SOLN
400.0000 mg | INTRAVENOUS | Status: AC
  Administered 2021-02-23: 400 mg via INTRAVENOUS

## 2021-02-23 MED ORDER — FENTANYL CITRATE (PF) 250 MCG/5ML IJ SOLN
INTRAMUSCULAR | Status: AC
  Filled 2021-02-23: qty 5

## 2021-02-23 MED ORDER — LIDOCAINE-EPINEPHRINE (PF) 1 %-1:200000 IJ SOLN
INTRAMUSCULAR | Status: AC
  Filled 2021-02-23: qty 30

## 2021-02-23 MED ORDER — SCOPOLAMINE 1 MG/3DAYS TD PT72: 1.0000 | MEDICATED_PATCH | TRANSDERMAL | Status: DC

## 2021-02-23 MED ORDER — MIDAZOLAM HCL 2 MG/2ML IJ SOLN
INTRAMUSCULAR | Status: AC
  Filled 2021-02-23: qty 2

## 2021-02-23 MED ORDER — OXYCODONE HCL 5 MG/5ML PO SOLN: 5.0000 mg | Freq: Once | ORAL | Status: DC | PRN

## 2021-02-23 MED ORDER — KETAMINE HCL 50 MG/5ML IJ SOSY
PREFILLED_SYRINGE | INTRAMUSCULAR | Status: AC
  Filled 2021-02-23: qty 5

## 2021-02-23 MED ORDER — SODIUM CHLORIDE 0.9 % IV SOLN
INTRAVENOUS | Status: DC | PRN
  Administered 2021-02-23: 500 mL

## 2021-02-23 MED ORDER — CIPROFLOXACIN IN D5W 400 MG/200ML IV SOLN
INTRAVENOUS | Status: AC
  Filled 2021-02-23: qty 200

## 2021-02-23 MED ORDER — CHLORHEXIDINE GLUCONATE 0.12 % MT SOLN
OROMUCOSAL | Status: AC
  Administered 2021-02-23: 15 mL via OROMUCOSAL
  Filled 2021-02-23: qty 15

## 2021-02-23 MED ORDER — PHENYLEPHRINE HCL-NACL 10-0.9 MG/250ML-% IV SOLN
INTRAVENOUS | Status: DC | PRN
  Administered 2021-02-23: 25 ug/min via INTRAVENOUS

## 2021-02-23 MED ORDER — PROPOFOL 500 MG/50ML IV EMUL: INTRAVENOUS | Status: DC | PRN

## 2021-02-23 MED ORDER — ACETAMINOPHEN 10 MG/ML IV SOLN: 1000.0000 mg | Freq: Once | INTRAVENOUS | Status: DC | PRN

## 2021-02-23 MED ORDER — OXYCODONE-ACETAMINOPHEN 5-325 MG PO TABS: 1.0000 | ORAL_TABLET | Freq: Four times a day (QID) | ORAL | 0 refills | Status: DC | PRN

## 2021-02-23 MED ORDER — LIDOCAINE 2% (20 MG/ML) 5 ML SYRINGE
INTRAMUSCULAR | Status: DC | PRN
  Administered 2021-02-23: 50 mg via INTRAVENOUS

## 2021-02-23 MED ORDER — FENTANYL CITRATE (PF) 100 MCG/2ML IJ SOLN: 25.0000 ug | INTRAMUSCULAR | Status: DC | PRN

## 2021-02-23 MED ORDER — FENTANYL CITRATE (PF) 250 MCG/5ML IJ SOLN
INTRAMUSCULAR | Status: DC | PRN
  Administered 2021-02-23: 50 ug via INTRAVENOUS

## 2021-02-23 MED ORDER — ORAL CARE MOUTH RINSE: 15.0000 mL | Freq: Once | OROMUCOSAL | Status: AC

## 2021-02-23 MED ORDER — 0.9 % SODIUM CHLORIDE (POUR BTL) OPTIME
TOPICAL | Status: DC | PRN
  Administered 2021-02-23: 1000 mL

## 2021-02-23 MED ORDER — PROMETHAZINE HCL 25 MG/ML IJ SOLN
6.2500 mg | INTRAMUSCULAR | Status: DC | PRN
Start: 2021-02-23 — End: 2021-02-23

## 2021-02-23 MED ORDER — CHLORHEXIDINE GLUCONATE 0.12 % MT SOLN: 15.0000 mL | Freq: Once | OROMUCOSAL | Status: DC

## 2021-02-23 MED ORDER — CHLORHEXIDINE GLUCONATE 4 % EX LIQD: 1.0000 "application " | Freq: Once | CUTANEOUS | Status: DC

## 2021-02-23 MED ORDER — FENTANYL CITRATE (PF) 100 MCG/2ML IJ SOLN
INTRAMUSCULAR | Status: AC
  Filled 2021-02-23: qty 2

## 2021-02-23 SURGICAL SUPPLY — 42 items
APL PRP STRL LF DISP 70% ISPRP (MISCELLANEOUS) ×1
APL SKNCLS STERI-STRIP NONHPOA (GAUZE/BANDAGES/DRESSINGS) ×1
ARMBAND PINK RESTRICT EXTREMIT (MISCELLANEOUS) ×2 IMPLANT
BANDAGE ESMARK 6X9 LF (GAUZE/BANDAGES/DRESSINGS) IMPLANT
BENZOIN TINCTURE PRP APPL 2/3 (GAUZE/BANDAGES/DRESSINGS) ×2 IMPLANT
BNDG CMPR 9X6 STRL LF SNTH (GAUZE/BANDAGES/DRESSINGS) ×1
BNDG ESMARK 6X9 LF (GAUZE/BANDAGES/DRESSINGS) ×2
CANISTER SUCT 3000ML PPV (MISCELLANEOUS) ×2 IMPLANT
CANNULA VESSEL 3MM 2 BLNT TIP (CANNULA) ×2 IMPLANT
CHLORAPREP W/TINT 26 (MISCELLANEOUS) ×2 IMPLANT
CLIP VESOCCLUDE MED 6/CT (CLIP) ×2 IMPLANT
CLIP VESOCCLUDE SM WIDE 6/CT (CLIP) ×2 IMPLANT
COVER PROBE W GEL 5X96 (DRAPES) IMPLANT
COVER WAND RF STERILE (DRAPES) ×2 IMPLANT
CUFF TOURN SGL QUICK 24 (TOURNIQUET CUFF) ×2
CUFF TRNQT CYL 24X4X16.5-23 (TOURNIQUET CUFF) IMPLANT
ELECT REM PT RETURN 9FT ADLT (ELECTROSURGICAL) ×2
ELECTRODE REM PT RTRN 9FT ADLT (ELECTROSURGICAL) ×1 IMPLANT
GAUZE SPONGE 4X4 12PLY STRL LF (GAUZE/BANDAGES/DRESSINGS) ×1 IMPLANT
GLOVE SURG SS PI 8.0 STRL IVOR (GLOVE) ×2 IMPLANT
GOWN STRL REUS W/ TWL LRG LVL3 (GOWN DISPOSABLE) ×2 IMPLANT
GOWN STRL REUS W/ TWL XL LVL3 (GOWN DISPOSABLE) ×1 IMPLANT
GOWN STRL REUS W/TWL LRG LVL3 (GOWN DISPOSABLE) ×4
GOWN STRL REUS W/TWL XL LVL3 (GOWN DISPOSABLE) ×2
INSERT FOGARTY SM (MISCELLANEOUS) IMPLANT
KIT BASIN OR (CUSTOM PROCEDURE TRAY) ×2 IMPLANT
KIT TURNOVER KIT B (KITS) ×2 IMPLANT
NS IRRIG 1000ML POUR BTL (IV SOLUTION) ×2 IMPLANT
PACK CV ACCESS (CUSTOM PROCEDURE TRAY) ×2 IMPLANT
PAD ARMBOARD 7.5X6 YLW CONV (MISCELLANEOUS) ×4 IMPLANT
PENCIL SMOKE EVACUATOR (MISCELLANEOUS) ×2 IMPLANT
STAPLER VISISTAT 35W (STAPLE) ×1 IMPLANT
STRIP CLOSURE SKIN 1/2X4 (GAUZE/BANDAGES/DRESSINGS) ×2 IMPLANT
SUT MNCRL AB 4-0 PS2 18 (SUTURE) ×2 IMPLANT
SUT PROLENE 5 0 C 1 24 (SUTURE) ×1 IMPLANT
SUT PROLENE 6 0 BV (SUTURE) ×2 IMPLANT
SUT VIC AB 3-0 SH 27 (SUTURE) ×2
SUT VIC AB 3-0 SH 27X BRD (SUTURE) ×1 IMPLANT
TAPE CLOTH SOFT 2X10 (GAUZE/BANDAGES/DRESSINGS) ×1 IMPLANT
TOWEL GREEN STERILE (TOWEL DISPOSABLE) ×2 IMPLANT
UNDERPAD 30X36 HEAVY ABSORB (UNDERPADS AND DIAPERS) ×2 IMPLANT
WATER STERILE IRR 1000ML POUR (IV SOLUTION) ×2 IMPLANT

## 2021-02-23 NOTE — Op Note (Addendum)
DATE OF SERVICE: 02/23/2021  PATIENT:  Tiffany Golden  45 y.o. female  PRE-OPERATIVE DIAGNOSIS:  Ulcerated left upper extremity arteriovenous fistula  POST-OPERATIVE DIAGNOSIS:  Same  PROCEDURE:   Plication of left upper extremity arteriovenous fistula  SURGEON:  Surgeon(s) and Role:    * Cherre Robins, MD - Primary  ASSISTANT: Leontine Locket, PA-C  An assistant was required to facilitate exposure and expedite the case.  ANESTHESIA:   general  EBL: min  BLOOD ADMINISTERED:none  DRAINS: none   LOCAL MEDICATIONS USED:  none  SPECIMEN:  none  COUNTS: confirmed correct.  TOURNIQUET:    Total Tourniquet Time Documented: Upper Arm (Left) - 12 minutes Total: Upper Arm (Left) - 12 minutes  PATIENT DISPOSITION:  PACU - hemodynamically stable.   Delay start of Pharmacological VTE agent (>24hrs) due to surgical blood loss or risk of bleeding: no  INDICATION FOR PROCEDURE: Tiffany Golden is a 45 y.o. female with ulcerated left upper extremity AVF. After careful discussion of risks, benefits, and alternatives the patient was offered revision. We specifically discussed loss of access and need for dialysis catheter. I recommended she allow me to place a tunneled dialysis catheter but she strongly preferred I not place one. I explained her fistula may not be usable after this procedure, she understood and wished to proceed.  OPERATIVE FINDINGS: excised a small segment of severely ulcerated AVF in mid arm. Fistula eschar debrided under tourniquet control demonstrating wide communication with fistula lumen.  DESCRIPTION OF PROCEDURE: After identification of the patient in the pre-operative holding area, the patient was transferred to the operating room. The patient was positioned supine on the operating room table. Anesthesia was induced. The left arm was prepped and draped in standard fashion. A surgical pause was performed confirming correct patient, procedure, and operative  location.  An elipse was marked around the diseased fistula.  A pneumatic tourniquet was placed across the upper arm.  The arm was exsanguinated with an Esmarch tourniquet.  The pneumatic tourniquet was inflated.  The incision was made over the fistula.  This was carried down through subcutaneous tissue using Bovie.  The skin adherent to the fistula were sharply transected with Mayo scissors.  Subcutaneous flaps were created to allow tension-free closure over the fistula.  The fistula was closed in 2 layers using 5-0 Prolene suture.  The tourniquet was released.  Hemostasis was achieved.  A palpable thrill was noted in the fistula.  Skin was closed over the fistula using 3-0 Vicryl and 2-0 nylon and surgical staples.   Upon completion of the case instrument and sharps counts were confirmed correct. The patient was transferred to the PACU in good condition. I was present for all portions of the procedure.  Yevonne Aline. Stanford Breed, MD [facility] Phone Number: [phone] 02/23/2021 3:05 PM

## 2021-02-23 NOTE — Anesthesia Procedure Notes (Signed)
Procedure Name: LMA Insertion Date/Time: 02/23/2021 2:14 PM Performed by: Hoy Morn, CRNA Pre-anesthesia Checklist: Patient identified, Emergency Drugs available, Suction available and Patient being monitored Patient Re-evaluated:Patient Re-evaluated prior to induction Oxygen Delivery Method: Circle system utilized Preoxygenation: Pre-oxygenation with 100% oxygen Induction Type: IV induction Ventilation: Mask ventilation without difficulty LMA: LMA inserted LMA Size: 4.0 Number of attempts: 1 Placement Confirmation: breath sounds checked- equal and bilateral and positive ETCO2 Tube secured with: Tape Dental Injury: Teeth and Oropharynx as per pre-operative assessment

## 2021-02-23 NOTE — Transfer of Care (Signed)
Immediate Anesthesia Transfer of Care Note  Patient: Tiffany Golden  Procedure(s) Performed: PLICATION OF LEFT ARM FISTULA (Left Arm Upper)  Patient Location: PACU  Anesthesia Type:General  Level of Consciousness: drowsy and patient cooperative  Airway & Oxygen Therapy: Patient Spontanous Breathing and Patient connected to face mask oxygen  Post-op Assessment: Report given to RN and Post -op Vital signs reviewed and stable  Post vital signs: Reviewed and stable  Last Vitals:  Vitals Value Taken Time  BP 123/64 02/23/21 1513  Temp    Pulse 72 02/23/21 1514  Resp 21 02/23/21 1514  SpO2 100 % 02/23/21 1514  Vitals shown include unvalidated device data.  Last Pain:  Vitals:   02/23/21 1202  TempSrc:   PainSc: 0-No pain      Patients Stated Pain Goal: 4 ([id number])  Complications: No complications documented.

## 2021-02-23 NOTE — Interval H&P Note (Signed)
History and Physical Interval Note:  02/23/2021 2:04 PM  Tiffany Golden  has presented today for surgery, with the diagnosis of COMPLICATION OF ARTERIOVENOUS FISTULA.  The various methods of treatment have been discussed with the patient and family. After consideration of risks, benefits and other options for treatment, the patient has consented to  Procedure(s) with comments: PLICATION OF LEFT ARM FISTULA (Left) - PERIPHERAL NERVE BLOCK as a surgical intervention.  The patient's history has been reviewed, patient examined, no change in status, stable for surgery.  I have reviewed the patient's chart and labs.  Questions were answered to the patient's satisfaction.     Cherre Robins

## 2021-02-23 NOTE — Anesthesia Preprocedure Evaluation (Addendum)
Anesthesia Evaluation  Patient identified by MRN, date of birth, ID band Patient awake    Reviewed: Allergy & Precautions, NPO status , Patient's Chart, lab work & pertinent test results  History of Anesthesia Complications (+) PONV  Airway Mallampati: II  TM Distance: >3 FB Neck ROM: Full    Dental  (+) Teeth Intact   Pulmonary neg pulmonary ROS, former smoker,    Pulmonary exam normal        Cardiovascular hypertension, Pt. on medications and Pt. on home beta blockers  Rhythm:Regular Rate:Normal     Neuro/Psych Depression TIA   GI/Hepatic negative GI ROS, Neg liver ROS,   Endo/Other  negative endocrine ROS  Renal/GU ESRF and DialysisRenal disease  negative genitourinary   Musculoskeletal  (+) Arthritis , Osteoarthritis,    Abdominal (+)  Abdomen: soft. Bowel sounds: normal.  Peds  Hematology  (+) anemia ,   Anesthesia Other Findings   Reproductive/Obstetrics                             Anesthesia Physical Anesthesia Plan  ASA: III  Anesthesia Plan: General   Post-op Pain Management:    Induction: Intravenous  PONV Risk Score and Plan: 4 or greater and Ondansetron, Dexamethasone, Midazolam and Treatment may vary due to age or medical condition  Airway Management Planned: Mask and LMA  Additional Equipment: None  Intra-op Plan:   Post-operative Plan: Extubation in OR  Informed Consent: I have reviewed the patients History and Physical, chart, labs and discussed the procedure including the risks, benefits and alternatives for the proposed anesthesia with the patient or authorized representative who has indicated his/her understanding and acceptance.     Dental advisory given  Plan Discussed with: CRNA  Anesthesia Plan Comments: (Lab Results      Component                Value               Date                      WBC                      6.9                  01/24/2021                HGB                      13.6                01/24/2021                HCT                      39.6                01/24/2021                MCV                      100.3 (H)           01/24/2021                PLT  222                 01/24/2021           Lab Results      Component                Value               Date                      NA                       136                 12/14/2020                K                        4.1                 12/14/2020                CO2                      30                  12/14/2020                GLUCOSE                  87                  12/14/2020                BUN                      13                  12/14/2020                CREATININE               5.20 (H)            12/14/2020                CALCIUM                  9.1                 12/14/2020                GFRNONAA                 10 (L)              12/14/2020                GFRAA                    7 (L)               12/29/2019          )       Anesthesia Quick Evaluation

## 2021-02-23 NOTE — H&P (View-Only) (Signed)
Referring Physician: [facility]  Patient name: Tiffany Golden MRN: [id number] DOB: Nov 22, 1975 Sex: female  REASON FOR CONSULT: Aneurysmal degeneration left arm AV fistula  HPI: Tiffany Golden is a 45 y.o. female, who had a needle left in her AV fistula December 14, 2020.  The needle was removed later in the emergency room.  The area of the needlestick never completely healed.  She has developed a scab over this.  It has bled with significant blood loss on one prior occasion.  She dialyzes Monday Wednesday Friday.  She did have a plication of the proximal and middle third of her AV fistula by me in 2014.  She has never had any prior access other than current access.  Other medical problems include HIV and hypertension both of which have been stable.  She is not on anticoagulation.  Past Medical History:  Diagnosis Date  . Anemia   . Chronic kidney disease   . Complication of anesthesia   . Dialysis patient [facility])    mon, wed 35  . ESRD (end stage renal disease) ([place])   . HIV infection ([place])   . Hypertension   . PONV (postoperative nausea and vomiting)   . Stroke ([place])   . TIA (transient ischemic attack)    Hx:of   Past Surgical History:  Procedure Laterality Date  . ARTERIOVENOUS GRAFT PLACEMENT  09/11/11   left arm  . CESAREAN SECTION    . DILITATION & CURRETTAGE/HYSTROSCOPY WITH NOVASURE ABLATION N/A 05/03/2015   Procedure: DILATATION/HYSTEROSCOPY WITH NOVASURE ABLATION; uterine cavity length 5.0 cm, uterine cavity width 3.8 cm, power 105 watts; time 1 minute 12 seconds;  Surgeon: Jonnie Kind, MD;  [facility];  Service: Gynecology;  Laterality: N/A;  . INSERTION OF DIALYSIS CATHETER Right 09/01/2013   Procedure: INSERTION OF DIALYSIS CATHETER Right Internal Jugular;  Surgeon: Elam Dutch, MD;  Location: [place];  Service: Vascular;  Laterality: Right;  . PATCH ANGIOPLASTY Left 09/01/2013   Procedure: PATCH ANGIOPLASTY;  Surgeon: Elam Dutch, MD;   Location: [place];  Service: Vascular;  Laterality: Left;  . REVISON OF ARTERIOVENOUS FISTULA Left [id number]   Procedure: Plication left arm fistula;  Surgeon: Elam Dutch, MD;  Location: [facility] OR;  Service: Vascular;  Laterality: Left;    Family History  Problem Relation Age of Onset  . Hyperlipidemia Mother   . Cancer - Other Mother        Hx partial hysterectomy  . Heart disease Father        Hx CABG    SOCIAL HISTORY: Social History   Socioeconomic History  . Marital status: Single    Spouse name: Not on file  . Number of children: Not on file  . Years of education: Not on file  . Highest education level: Not on file  Occupational History  . Not on file  Tobacco Use  . Smoking status: Former Smoker    Packs/day: 0.50    Years: 3.00    Pack years: 1.50    Types: Cigarettes    Quit date: 09/05/2009    Years since quitting: 11.4  . Smokeless tobacco: Never Used  Vaping Use  . Vaping Use: Never used  Substance and Sexual Activity  . Alcohol use: No  . Drug use: No  . Sexual activity: Not Currently    Partners: Male    Comment: declined condoms  Other Topics Concern  . Not on file  Social History Narrative  .  Not on file   Social Determinants of Health   Financial Resource Strain: Not on file  Food Insecurity: Not on file  Transportation Needs: Not on file  Physical Activity: Not on file  Stress: Not on file  Social Connections: Not on file  Intimate Partner Violence: Not on file    Allergies  Allergen Reactions  . Fortaz [Ceftazidime Sodium In Dextrose] Rash    Head-toe  . Sulfa Antibiotics Hives  . Vancomycin Rash    Head-toe    Current Outpatient Medications  Medication Sig Dispense Refill  . B Complex-C-Folic Acid (RENA-VITE RX) 1 MG TABS Take 1 tablet by mouth daily.    . bictegravir-emtricitabine-tenofovir AF (BIKTARVY) 50-200-25 MG TABS tablet TAKE 1 TABLET BY MOUTH DAILY. 30 tablet 0  . calcium carbonate (TUMS - DOSED IN MG ELEMENTAL  CALCIUM) 500 MG chewable tablet Chew 1-2 tablets by mouth daily as needed for indigestion or heartburn.    . diltiazem (CARDIZEM) 30 MG tablet Take 1 tablet (30 mg total) by mouth every 6 (six) hours as needed (for elevated heart rates). 30 tablet 1  . metoprolol tartrate (LOPRESSOR) 25 MG tablet Take 12.5 mg by mouth 2 (two) times daily as needed (for blood pressure). Except takes none on dialysis days (Tuesday, Thursday, and Sunday).    . sertraline (ZOLOFT) 100 MG tablet Take 1 tablet (100 mg total) by mouth daily. 30 tablet 5   No current facility-administered medications for this visit.    ROS:   General:  No weight loss, Fever, chills  HEENT: No recent headaches, no nasal bleeding, no visual changes, no sore throat  Neurologic: No dizziness, blackouts, seizures. No recent symptoms of stroke or mini- stroke. No recent episodes of slurred speech, or temporary blindness.  Cardiac: No recent episodes of chest pain/pressure, no shortness of breath at rest.  No shortness of breath with exertion.  Denies history of atrial fibrillation or irregular heartbeat  Vascular: No history of rest pain in feet.  No history of claudication.  No history of non-healing ulcer, No history of DVT   Pulmonary: No home oxygen, no productive cough, no hemoptysis,  No asthma or wheezing  Musculoskeletal:  '[ ]'$  Arthritis, '[ ]'$  Low back pain,  '[ ]'$  Joint pain  Hematologic:No history of hypercoagulable state.  No history of easy bleeding.  No history of anemia  Gastrointestinal: No hematochezia or melena,  No gastroesophageal reflux, no trouble swallowing  Urinary: '[X]'$  chronic Kidney disease, '[X]'$  on HD - '[X]'$  MWF or '[ ]'$  TTHS, '[ ]'$  Burning with urination, '[ ]'$  Frequent urination, '[ ]'$  Difficulty urinating;   Skin: No rashes  Psychological: No history of anxiety,  No history of depression   Physical Examination  Vitals:   02/23/21 0847  BP: 117/79  Pulse: 76  Resp: 20  Temp: 98.1 F (36.7 C)  SpO2: 98%   Weight: 187 lb (84.8 kg)  Height: '5\' 1"'$  (1.549 m)    Body mass index is 35.33 kg/m.  General:  Alert and oriented, no acute distress HEENT: Normal Neck: No JVD Cardiac: Regular Rate and Rhythm Skin: No rash, aneurysmal degeneration left upper arm AV fistula.  There is shiny skin with Melanin depletion over 2 aneurysms in the middle portion of the fistula.  There is a 4 mm eschar over the mid fistula which appears very fragile.  There is a palpable thrill in the fistula.  The proximal aspect of the fistula is very stiff on palpation suggesting severe calcification.  The middle and distal thirds seem more pliable. 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   PLAN: Patient will be added on today's OR schedule for urgent repair of this.  Since she has had a prior plication and the fistula is almost 47 years old after we address this urgent situation I believe we should begin planning her new access.  She will need a vein mapping and upper extremity arterial duplex of her right arm at her next office visit.   Ruta Hinds, MD [facility] [place] Office: [phone]

## 2021-02-23 NOTE — Discharge Instructions (Signed)
[facility]  Discharge Instructions  AV Fistula or Graft Surgery for Dialysis Access  Please refer to the following instructions for your post-procedure care. Your surgeon or physician assistant will discuss any changes with you.  Activity  You may drive the day following your surgery, if you are comfortable and no longer taking prescription pain medication. Resume full activity as the soreness in your incision resolves.  Bathing/Showering  You may shower after you go home. Keep your incision dry for 48 hours. Do not soak in a bathtub, hot tub, or swim until the incision heals completely. You may not shower if you have a hemodialysis catheter.  Incision Care  Clean your incision with mild soap and water after 48 hours. Pat the area dry with a clean towel. You do not need a bandage unless otherwise instructed. Do not apply any ointments or creams to your incision. You may have skin glue on your incision. Do not peel it off. It will come off on its own in about one week. Your arm may swell a bit after surgery. To reduce swelling use pillows to elevate your arm so it is above your heart. Your doctor will tell you if you need to lightly wrap your arm with an ACE bandage.  Diet  Resume your normal diet. There are not special food restrictions following this procedure. In order to heal from your surgery, it is CRITICAL to get adequate nutrition. Your body requires vitamins, minerals, and protein. Vegetables are the best source of vitamins and minerals. Vegetables also provide the perfect balance of protein. Processed food has little nutritional value, so try to avoid this.  Medications  Resume taking all of your medications. If your incision is causing pain, you may take over-the counter pain relievers such as acetaminophen (Tylenol). If you were prescribed a stronger pain medication, please be aware these medications can cause nausea and constipation. Prevent  nausea by taking the medication with a snack or meal. Avoid constipation by drinking plenty of fluids and eating foods with high amount of fiber, such as fruits, vegetables, and grains.  Do not take Tylenol if you are taking prescription pain medications.  Follow up Your surgeon may want to see you in the office following your access surgery. If so, this will be arranged at the time of your surgery.  Please call us immediately for any of the following conditions:  . Increased pain, redness, drainage (pus) from your incision site . Fever of 101 degrees or higher . Severe or worsening pain at your incision site . Hand pain or numbness. .  Reduce your risk of vascular disease:  . Stop smoking. If you would like help, call [facility] at 1-800-QUIT-NOW 814 427 2954) or [place] at (210)314-2027  . Manage your cholesterol . Maintain a desired weight . Control your diabetes . Keep your blood pressure down  Dialysis  It will take several weeks to several months for your new dialysis access to be ready for use. Your surgeon will determine when it is okay to use it. Your nephrologist will continue to direct your dialysis. You can continue to use your Permcath until your new access is ready for use.   02/23/2021 Dillyn Nardin Bady [id number] [birth year]  Surgeon(s): Cherre Robins, MD  Procedure(s): PLICATION OF LEFT ARM FISTULA  x May stick graft on designated area only:  Do NOT stick over incision for 6 weeks. May stick above and below incision now. SEE DIAGRAM   If  you have any questions, please call the office at [phone].

## 2021-02-23 NOTE — Progress Notes (Signed)
Referring Physician: [facility]  Patient name: Tiffany Golden MRN: [id number] DOB: 1976/01/28 Sex: female  REASON FOR CONSULT: Aneurysmal degeneration left arm AV fistula  HPI: Tiffany Golden is a 45 y.o. female, who had a needle left in her AV fistula December 14, 2020.  The needle was removed later in the emergency room.  The area of the needlestick never completely healed.  She has developed a scab over this.  It has bled with significant blood loss on one prior occasion.  She dialyzes Monday Wednesday Friday.  She did have a plication of the proximal and middle third of her AV fistula by me in 2014.  She has never had any prior access other than current access.  Other medical problems include HIV and hypertension both of which have been stable.  She is not on anticoagulation.  Past Medical History:  Diagnosis Date  . Anemia   . Chronic kidney disease   . Complication of anesthesia   . Dialysis patient [facility])    mon, wed 89  . ESRD (end stage renal disease) ([place])   . HIV infection ([place])   . Hypertension   . PONV (postoperative nausea and vomiting)   . Stroke ([place])   . TIA (transient ischemic attack)    Hx:of   Past Surgical History:  Procedure Laterality Date  . ARTERIOVENOUS GRAFT PLACEMENT  09/11/11   left arm  . CESAREAN SECTION    . DILITATION & CURRETTAGE/HYSTROSCOPY WITH NOVASURE ABLATION N/A 05/03/2015   Procedure: DILATATION/HYSTEROSCOPY WITH NOVASURE ABLATION; uterine cavity length 5.0 cm, uterine cavity width 3.8 cm, power 105 watts; time 1 minute 12 seconds;  Surgeon: Jonnie Kind, MD;  Location: [facility];  Service: Gynecology;  Laterality: N/A;  . INSERTION OF DIALYSIS CATHETER Right 09/01/2013   Procedure: INSERTION OF DIALYSIS CATHETER Right Internal Jugular;  Surgeon: Elam Dutch, MD;  Location: [place];  Service: Vascular;  Laterality: Right;  . PATCH ANGIOPLASTY Left 09/01/2013   Procedure: PATCH ANGIOPLASTY;  Surgeon: Elam Dutch, MD;   Location: [place];  Service: Vascular;  Laterality: Left;  . REVISON OF ARTERIOVENOUS FISTULA Left [id number]   Procedure: Plication left arm fistula;  Surgeon: Elam Dutch, MD;  Location: [facility] OR;  Service: Vascular;  Laterality: Left;    Family History  Problem Relation Age of Onset  . Hyperlipidemia Mother   . Cancer - Other Mother        Hx partial hysterectomy  . Heart disease Father        Hx CABG    SOCIAL HISTORY: Social History   Socioeconomic History  . Marital status: Single    Spouse name: Not on file  . Number of children: Not on file  . Years of education: Not on file  . Highest education level: Not on file  Occupational History  . Not on file  Tobacco Use  . Smoking status: Former Smoker    Packs/day: 0.50    Years: 3.00    Pack years: 1.50    Types: Cigarettes    Quit date: 09/05/2009    Years since quitting: 11.4  . Smokeless tobacco: Never Used  Vaping Use  . Vaping Use: Never used  Substance and Sexual Activity  . Alcohol use: No  . Drug use: No  . Sexual activity: Not Currently    Partners: Male    Comment: declined condoms  Other Topics Concern  . Not on file  Social History Narrative  .  Not on file   Social Determinants of Health   Financial Resource Strain: Not on file  Food Insecurity: Not on file  Transportation Needs: Not on file  Physical Activity: Not on file  Stress: Not on file  Social Connections: Not on file  Intimate Partner Violence: Not on file    Allergies  Allergen Reactions  . Fortaz [Ceftazidime Sodium In Dextrose] Rash    Head-toe  . Sulfa Antibiotics Hives  . Vancomycin Rash    Head-toe    Current Outpatient Medications  Medication Sig Dispense Refill  . B Complex-C-Folic Acid (RENA-VITE RX) 1 MG TABS Take 1 tablet by mouth daily.    . bictegravir-emtricitabine-tenofovir AF (BIKTARVY) 50-200-25 MG TABS tablet TAKE 1 TABLET BY MOUTH DAILY. 30 tablet 0  . calcium carbonate (TUMS - DOSED IN MG ELEMENTAL  CALCIUM) 500 MG chewable tablet Chew 1-2 tablets by mouth daily as needed for indigestion or heartburn.    . diltiazem (CARDIZEM) 30 MG tablet Take 1 tablet (30 mg total) by mouth every 6 (six) hours as needed (for elevated heart rates). 30 tablet 1  . metoprolol tartrate (LOPRESSOR) 25 MG tablet Take 12.5 mg by mouth 2 (two) times daily as needed (for blood pressure). Except takes none on dialysis days (Tuesday, Thursday, and Sunday).    . sertraline (ZOLOFT) 100 MG tablet Take 1 tablet (100 mg total) by mouth daily. 30 tablet 5   No current facility-administered medications for this visit.    ROS:   General:  No weight loss, Fever, chills  HEENT: No recent headaches, no nasal bleeding, no visual changes, no sore throat  Neurologic: No dizziness, blackouts, seizures. No recent symptoms of stroke or mini- stroke. No recent episodes of slurred speech, or temporary blindness.  Cardiac: No recent episodes of chest pain/pressure, no shortness of breath at rest.  No shortness of breath with exertion.  Denies history of atrial fibrillation or irregular heartbeat  Vascular: No history of rest pain in feet.  No history of claudication.  No history of non-healing ulcer, No history of DVT   Pulmonary: No home oxygen, no productive cough, no hemoptysis,  No asthma or wheezing  Musculoskeletal:  '[ ]'$  Arthritis, '[ ]'$  Low back pain,  '[ ]'$  Joint pain  Hematologic:No history of hypercoagulable state.  No history of easy bleeding.  No history of anemia  Gastrointestinal: No hematochezia or melena,  No gastroesophageal reflux, no trouble swallowing  Urinary: '[X]'$  chronic Kidney disease, '[X]'$  on HD - '[X]'$  MWF or '[ ]'$  TTHS, '[ ]'$  Burning with urination, '[ ]'$  Frequent urination, '[ ]'$  Difficulty urinating;   Skin: No rashes  Psychological: No history of anxiety,  No history of depression   Physical Examination  Vitals:   02/23/21 0847  BP: 117/79  Pulse: 76  Resp: 20  Temp: 98.1 F (36.7 C)  SpO2: 98%   Weight: 187 lb (84.8 kg)  Height: '5\' 1"'$  (1.549 m)    Body mass index is 35.33 kg/m.  General:  Alert and oriented, no acute distress HEENT: Normal Neck: No JVD Cardiac: Regular Rate and Rhythm Skin: No rash, aneurysmal degeneration left upper arm AV fistula.  There is shiny skin with Melanin depletion over 2 aneurysms in the middle portion of the fistula.  There is a 4 mm eschar over the mid fistula which appears very fragile.  There is a palpable thrill in the fistula.  The proximal aspect of the fistula is very stiff on palpation suggesting severe calcification.  The middle and distal thirds seem more pliable. 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   PLAN: Patient will be added on today's OR schedule for urgent repair of this.  Since she has had a prior plication and the fistula is almost 47 years old after we address this urgent situation I believe we should begin planning her new access.  She will need a vein mapping and upper extremity arterial duplex of her right arm at her next office visit.   Ruta Hinds, MD [facility] [place] Office: [phone]

## 2021-02-24 ENCOUNTER — Encounter (HOSPITAL_COMMUNITY): Payer: Self-pay | Admitting: Vascular Surgery

## 2021-02-24 DIAGNOSIS — N2581 Secondary hyperparathyroidism of renal origin: Secondary | ICD-10-CM | POA: Diagnosis not present

## 2021-02-24 DIAGNOSIS — D631 Anemia in chronic kidney disease: Secondary | ICD-10-CM | POA: Diagnosis not present

## 2021-02-24 DIAGNOSIS — Z992 Dependence on renal dialysis: Secondary | ICD-10-CM | POA: Diagnosis not present

## 2021-02-24 DIAGNOSIS — N186 End stage renal disease: Secondary | ICD-10-CM | POA: Diagnosis not present

## 2021-02-24 NOTE — Anesthesia Postprocedure Evaluation (Signed)
Anesthesia Post Note  Patient: Tiffany Golden  Procedure(s) Performed: PLICATION OF LEFT ARM FISTULA (Left Arm Upper)     Patient location during evaluation: PACU Anesthesia Type: General Level of consciousness: awake and alert Pain management: pain level controlled Vital Signs Assessment: post-procedure vital signs reviewed and stable Respiratory status: spontaneous breathing, nonlabored ventilation, respiratory function stable and patient connected to nasal cannula oxygen Cardiovascular status: blood pressure returned to baseline and stable Postop Assessment: no apparent nausea or vomiting Anesthetic complications: no   No complications documented.  Last Vitals:  Vitals:   02/23/21 1543 02/23/21 1557  BP: 111/70 113/72  Pulse: 61 63  Resp: 16 16  Temp:  (!) 36.2 C  SpO2: 96% 100%    Last Pain:  Vitals:   02/23/21 1557  TempSrc:   PainSc: 0-No pain                 Belenda Cruise P Terease [place]

## 2021-02-27 DIAGNOSIS — N2581 Secondary hyperparathyroidism of renal origin: Secondary | ICD-10-CM | POA: Diagnosis not present

## 2021-02-27 DIAGNOSIS — N186 End stage renal disease: Secondary | ICD-10-CM | POA: Diagnosis not present

## 2021-02-27 DIAGNOSIS — Z992 Dependence on renal dialysis: Secondary | ICD-10-CM | POA: Diagnosis not present

## 2021-02-27 DIAGNOSIS — D631 Anemia in chronic kidney disease: Secondary | ICD-10-CM | POA: Diagnosis not present

## 2021-03-01 ENCOUNTER — Encounter (HOSPITAL_COMMUNITY): Payer: Self-pay | Admitting: Vascular Surgery

## 2021-03-01 DIAGNOSIS — N2581 Secondary hyperparathyroidism of renal origin: Secondary | ICD-10-CM | POA: Diagnosis not present

## 2021-03-01 DIAGNOSIS — N186 End stage renal disease: Secondary | ICD-10-CM | POA: Diagnosis not present

## 2021-03-01 DIAGNOSIS — Z992 Dependence on renal dialysis: Secondary | ICD-10-CM | POA: Diagnosis not present

## 2021-03-01 DIAGNOSIS — D631 Anemia in chronic kidney disease: Secondary | ICD-10-CM | POA: Diagnosis not present

## 2021-03-02 ENCOUNTER — Other Ambulatory Visit: Payer: Self-pay | Admitting: Infectious Disease

## 2021-03-02 ENCOUNTER — Other Ambulatory Visit (HOSPITAL_COMMUNITY): Payer: Self-pay

## 2021-03-02 DIAGNOSIS — B20 Human immunodeficiency virus [HIV] disease: Secondary | ICD-10-CM

## 2021-03-03 ENCOUNTER — Other Ambulatory Visit (HOSPITAL_COMMUNITY): Payer: Self-pay

## 2021-03-03 DIAGNOSIS — D631 Anemia in chronic kidney disease: Secondary | ICD-10-CM | POA: Diagnosis not present

## 2021-03-03 DIAGNOSIS — Z992 Dependence on renal dialysis: Secondary | ICD-10-CM | POA: Diagnosis not present

## 2021-03-03 DIAGNOSIS — N186 End stage renal disease: Secondary | ICD-10-CM | POA: Diagnosis not present

## 2021-03-03 DIAGNOSIS — N2581 Secondary hyperparathyroidism of renal origin: Secondary | ICD-10-CM | POA: Diagnosis not present

## 2021-03-03 MED ORDER — BIKTARVY 50-200-25 MG PO TABS
1.0000 | ORAL_TABLET | Freq: Every day | ORAL | 5 refills | Status: DC
Start: 2021-03-03 — End: 2021-08-08
  Filled 2021-03-03: qty 30, 30d supply, fill #0
  Filled 2021-04-03: qty 30, 30d supply, fill #1
  Filled 2021-04-25: qty 30, 30d supply, fill #2
  Filled 2021-05-24: qty 30, 30d supply, fill #3
  Filled 2021-06-22: qty 30, 30d supply, fill #4
  Filled 2021-07-24: qty 30, 30d supply, fill #5

## 2021-03-06 DIAGNOSIS — Z992 Dependence on renal dialysis: Secondary | ICD-10-CM | POA: Diagnosis not present

## 2021-03-06 DIAGNOSIS — D631 Anemia in chronic kidney disease: Secondary | ICD-10-CM | POA: Diagnosis not present

## 2021-03-06 DIAGNOSIS — N186 End stage renal disease: Secondary | ICD-10-CM | POA: Diagnosis not present

## 2021-03-06 DIAGNOSIS — N2581 Secondary hyperparathyroidism of renal origin: Secondary | ICD-10-CM | POA: Diagnosis not present

## 2021-03-08 ENCOUNTER — Other Ambulatory Visit (HOSPITAL_COMMUNITY): Payer: Self-pay

## 2021-03-08 DIAGNOSIS — N186 End stage renal disease: Secondary | ICD-10-CM | POA: Diagnosis not present

## 2021-03-08 DIAGNOSIS — D631 Anemia in chronic kidney disease: Secondary | ICD-10-CM | POA: Diagnosis not present

## 2021-03-08 DIAGNOSIS — Z992 Dependence on renal dialysis: Secondary | ICD-10-CM | POA: Diagnosis not present

## 2021-03-08 DIAGNOSIS — N2581 Secondary hyperparathyroidism of renal origin: Secondary | ICD-10-CM | POA: Diagnosis not present

## 2021-03-10 DIAGNOSIS — D631 Anemia in chronic kidney disease: Secondary | ICD-10-CM | POA: Diagnosis not present

## 2021-03-10 DIAGNOSIS — N2581 Secondary hyperparathyroidism of renal origin: Secondary | ICD-10-CM | POA: Diagnosis not present

## 2021-03-10 DIAGNOSIS — Z992 Dependence on renal dialysis: Secondary | ICD-10-CM | POA: Diagnosis not present

## 2021-03-10 DIAGNOSIS — N186 End stage renal disease: Secondary | ICD-10-CM | POA: Diagnosis not present

## 2021-03-12 DIAGNOSIS — I120 Hypertensive chronic kidney disease with stage 5 chronic kidney disease or end stage renal disease: Secondary | ICD-10-CM | POA: Diagnosis not present

## 2021-03-12 DIAGNOSIS — Z992 Dependence on renal dialysis: Secondary | ICD-10-CM | POA: Diagnosis not present

## 2021-03-12 DIAGNOSIS — N186 End stage renal disease: Secondary | ICD-10-CM | POA: Diagnosis not present

## 2021-03-13 DIAGNOSIS — Z992 Dependence on renal dialysis: Secondary | ICD-10-CM | POA: Diagnosis not present

## 2021-03-13 DIAGNOSIS — D631 Anemia in chronic kidney disease: Secondary | ICD-10-CM | POA: Diagnosis not present

## 2021-03-13 DIAGNOSIS — N186 End stage renal disease: Secondary | ICD-10-CM | POA: Diagnosis not present

## 2021-03-13 DIAGNOSIS — N2581 Secondary hyperparathyroidism of renal origin: Secondary | ICD-10-CM | POA: Diagnosis not present

## 2021-03-14 ENCOUNTER — Ambulatory Visit (INDEPENDENT_AMBULATORY_CARE_PROVIDER_SITE_OTHER): Payer: Medicare Other | Admitting: Physician Assistant

## 2021-03-14 ENCOUNTER — Other Ambulatory Visit: Payer: Self-pay

## 2021-03-14 VITALS — BP 132/86 | HR 69 | Temp 97.8°F | Resp 20 | Ht 61.0 in | Wt 190.3 lb

## 2021-03-14 DIAGNOSIS — Z992 Dependence on renal dialysis: Secondary | ICD-10-CM

## 2021-03-14 DIAGNOSIS — N186 End stage renal disease: Secondary | ICD-10-CM

## 2021-03-14 NOTE — Progress Notes (Signed)
    Postoperative Access Visit   History of Present Illness   AZION JALBERT is a 45 y.o. year old female who presents for postoperative follow-up for plication of left arm fistula by Dr. Stanford Breed on 02/23/2021.  Patient believes the incision is healing well.  She is able to dialyze from this fistula avoiding the area of the incision.  She dialyzes on a Monday Wednesday Friday schedule.   Physical Examination   Vitals:   03/14/21 1431  BP: 132/86  Pulse: 69  Resp: 20  Temp: 97.8 F (36.6 C)  TempSrc: Temporal  SpO2: 99%  Weight: 190 lb 4.8 oz (86.3 kg)  Height: '5\' 1"'$  (1.549 m)   Body mass index is 35.96 kg/m.  left arm Incision is healed, palpable radial pulse, hand grip is 5/5, sensation in digits is intact, palpable thrill, bruit can be auscultated     Medical Decision Making   Shiwani Maurin Bouknight is a 45 y.o. year old female who presents s/p plication of the left arm AV fistula   Patent fistula without signs or symptoms of steal syndrome  Incision appears to be well-healed; staples and sutures were removed  Continue to avoid area of incision until 04/06/2021 which would be 6 weeks postoperatively  Patient will follow-up as needed   Dagoberto Ligas PA-C [facility] [place] Office: [phone]  Clinic MD: Stanford Breed

## 2021-03-15 DIAGNOSIS — N2581 Secondary hyperparathyroidism of renal origin: Secondary | ICD-10-CM | POA: Diagnosis not present

## 2021-03-15 DIAGNOSIS — N186 End stage renal disease: Secondary | ICD-10-CM | POA: Diagnosis not present

## 2021-03-15 DIAGNOSIS — D631 Anemia in chronic kidney disease: Secondary | ICD-10-CM | POA: Diagnosis not present

## 2021-03-15 DIAGNOSIS — Z992 Dependence on renal dialysis: Secondary | ICD-10-CM | POA: Diagnosis not present

## 2021-03-17 DIAGNOSIS — N2581 Secondary hyperparathyroidism of renal origin: Secondary | ICD-10-CM | POA: Diagnosis not present

## 2021-03-17 DIAGNOSIS — Z992 Dependence on renal dialysis: Secondary | ICD-10-CM | POA: Diagnosis not present

## 2021-03-17 DIAGNOSIS — N186 End stage renal disease: Secondary | ICD-10-CM | POA: Diagnosis not present

## 2021-03-17 DIAGNOSIS — D631 Anemia in chronic kidney disease: Secondary | ICD-10-CM | POA: Diagnosis not present

## 2021-03-20 DIAGNOSIS — D631 Anemia in chronic kidney disease: Secondary | ICD-10-CM | POA: Diagnosis not present

## 2021-03-20 DIAGNOSIS — Z992 Dependence on renal dialysis: Secondary | ICD-10-CM | POA: Diagnosis not present

## 2021-03-20 DIAGNOSIS — N186 End stage renal disease: Secondary | ICD-10-CM | POA: Diagnosis not present

## 2021-03-20 DIAGNOSIS — N2581 Secondary hyperparathyroidism of renal origin: Secondary | ICD-10-CM | POA: Diagnosis not present

## 2021-03-22 DIAGNOSIS — N186 End stage renal disease: Secondary | ICD-10-CM | POA: Diagnosis not present

## 2021-03-22 DIAGNOSIS — N2581 Secondary hyperparathyroidism of renal origin: Secondary | ICD-10-CM | POA: Diagnosis not present

## 2021-03-22 DIAGNOSIS — D631 Anemia in chronic kidney disease: Secondary | ICD-10-CM | POA: Diagnosis not present

## 2021-03-22 DIAGNOSIS — Z992 Dependence on renal dialysis: Secondary | ICD-10-CM | POA: Diagnosis not present

## 2021-03-24 DIAGNOSIS — N186 End stage renal disease: Secondary | ICD-10-CM | POA: Diagnosis not present

## 2021-03-24 DIAGNOSIS — N2581 Secondary hyperparathyroidism of renal origin: Secondary | ICD-10-CM | POA: Diagnosis not present

## 2021-03-24 DIAGNOSIS — D631 Anemia in chronic kidney disease: Secondary | ICD-10-CM | POA: Diagnosis not present

## 2021-03-24 DIAGNOSIS — Z992 Dependence on renal dialysis: Secondary | ICD-10-CM | POA: Diagnosis not present

## 2021-03-27 DIAGNOSIS — Z992 Dependence on renal dialysis: Secondary | ICD-10-CM | POA: Diagnosis not present

## 2021-03-27 DIAGNOSIS — N2581 Secondary hyperparathyroidism of renal origin: Secondary | ICD-10-CM | POA: Diagnosis not present

## 2021-03-27 DIAGNOSIS — N186 End stage renal disease: Secondary | ICD-10-CM | POA: Diagnosis not present

## 2021-03-27 DIAGNOSIS — D631 Anemia in chronic kidney disease: Secondary | ICD-10-CM | POA: Diagnosis not present

## 2021-03-29 ENCOUNTER — Other Ambulatory Visit (HOSPITAL_COMMUNITY): Payer: Self-pay

## 2021-03-29 DIAGNOSIS — N2581 Secondary hyperparathyroidism of renal origin: Secondary | ICD-10-CM | POA: Diagnosis not present

## 2021-03-29 DIAGNOSIS — D631 Anemia in chronic kidney disease: Secondary | ICD-10-CM | POA: Diagnosis not present

## 2021-03-29 DIAGNOSIS — Z992 Dependence on renal dialysis: Secondary | ICD-10-CM | POA: Diagnosis not present

## 2021-03-29 DIAGNOSIS — N186 End stage renal disease: Secondary | ICD-10-CM | POA: Diagnosis not present

## 2021-03-30 ENCOUNTER — Ambulatory Visit (HOSPITAL_COMMUNITY)
Admission: RE | Admit: 2021-03-30 | Discharge: 2021-03-30 | Disposition: A | Discharge status: Home / Self Care | Payer: Medicare Other | Source: Ambulatory Visit | Attending: Adult Health | Admitting: Adult Health

## 2021-03-30 ENCOUNTER — Ambulatory Visit (INDEPENDENT_AMBULATORY_CARE_PROVIDER_SITE_OTHER): Payer: Medicare Other | Admitting: Adult Health

## 2021-03-30 ENCOUNTER — Encounter: Payer: Self-pay | Admitting: Adult Health

## 2021-03-30 ENCOUNTER — Other Ambulatory Visit: Payer: Self-pay

## 2021-03-30 ENCOUNTER — Other Ambulatory Visit (HOSPITAL_COMMUNITY)
Admission: RE | Admit: 2021-03-30 | Discharge: 2021-03-30 | Disposition: A | Discharge status: Home / Self Care | Payer: Medicare Other | Source: Ambulatory Visit | Attending: Adult Health | Admitting: Adult Health

## 2021-03-30 VITALS — BP 144/93 | HR 81 | Ht 63.0 in | Wt 191.0 lb

## 2021-03-30 DIAGNOSIS — Z01419 Encounter for gynecological examination (general) (routine) without abnormal findings: Secondary | ICD-10-CM | POA: Insufficient documentation

## 2021-03-30 DIAGNOSIS — Z1231 Encounter for screening mammogram for malignant neoplasm of breast: Secondary | ICD-10-CM | POA: Diagnosis not present

## 2021-03-30 DIAGNOSIS — Z1211 Encounter for screening for malignant neoplasm of colon: Secondary | ICD-10-CM | POA: Insufficient documentation

## 2021-03-30 DIAGNOSIS — Z1151 Encounter for screening for human papillomavirus (HPV): Secondary | ICD-10-CM | POA: Diagnosis not present

## 2021-03-30 NOTE — Progress Notes (Signed)
Patient ID: Tiffany Golden, female   DOB: 21-Jun-1976, 45 y.o.   MRN: [id number] History of Present Illness: Tiffany Golden is a 44 year old black female,single, G1P1 in for a well woman gyn exam and pap. She does dialysis 3 days a week and is doing well, she had her mammogram this morning. PCP is [facility].   Current Medications, Allergies, Past Medical History, Past Surgical History, Family History and Social History were reviewed in [employer] record.     Review of Systems: Patient denies any headaches, hearing loss, fatigue, blurred vision, shortness of breath, chest pain, abdominal pain, problems with bowel movements,  or intercourse(not active). No joint pain or mood swings.    Physical Exam:BP (!) 144/93 (BP Location: Right Arm, Patient Position: Sitting, Cuff Size: Normal)   Pulse 81   Ht '5\' 3"'$  (1.6 m)   Wt 191 lb (86.6 kg)   BMI 33.83 kg/m  General:  Well developed, well nourished, no acute distress Skin:  Warm and dry Neck:  Midline trachea, normal thyroid, good ROM, no lymphadenopathy Lungs; Clear to auscultation bilaterally Breast:  No dominant palpable mass, retraction, or nipple discharge Cardiovascular: Regular rate and rhythm Abdomen:  Soft, non tender, no hepatosplenomegaly Pelvic:  External genitalia is normal in appearance, no lesions.  The vagina is normal in appearance. Urethra has no lesions or masses. The cervix is smooth, pap with GC/CHL and HR HPV genotyping performed.  Uterus is felt to be normal size, shape, and contour.  No adnexal masses or tenderness noted.Bladder is non tender, no masses felt. Rectal: Good sphincter tone, no polyps, or hemorrhoids felt.   Extremities/musculoskeletal:  No swelling or varicosities noted, no clubbing or cyanosis Psych:  No mood changes, alert and cooperative,seems happy AA is 0 Fall risk is low Depression screen [facility] 2/9 03/30/2021 02/07/2021 10/25/2020 02/25/2020 02/25/2020  Decreased Interest 0 0 0 1 0  Down,  Depressed, Hopeless 1 0 '1 1 1  '$ PHQ - 2 Score 1 0 '1 2 1  '$ Altered sleeping 1 - 1 2 -  Tired, decreased energy 0 - 1 1 -  Change in appetite 0 - 0 0 -  Feeling bad or failure about yourself  0 - 1 0 -  Trouble concentrating 0 - 0 0 -  Moving slowly or fidgety/restless 0 - 0 0 -  Suicidal thoughts 0 - 0 0 -  PHQ-9 Score 2 - 4 5 -  Difficult doing work/chores - - Not difficult at all Somewhat difficult -  Some recent data might be hidden   She is on Zoloft GAD 7 : Generalized Anxiety Score 03/30/2021  Nervous, Anxious, on Edge 1  Control/stop worrying 1  Worry too much - different things 1  Trouble relaxing 1  Restless 1  Easily annoyed or irritable 1  Afraid - awful might happen 1  Total GAD 7 Score 7    Upstream - 03/30/21 [id number]      Pregnancy Intention Screening   Does the patient want to become pregnant in the next year? No    Does the patient's partner want to become pregnant in the next year? No    Would the patient like to discuss contraceptive options today? No      Contraception Wrap Up   Current Method Abstinence    End Method Abstinence    Contraception Counseling Provided No         Examination chaperoned by Celene Squibb LPN   Impression and Plan: 1.  Encounter for gynecological examination with Papanicolaou smear of cervix Pap sent Physical in 1 year Pap in 3 years if normal Mammogram yearly had this am Labs with PCP Advised colonoscopy or cologuard, will talk with Dr Nicki Reaper

## 2021-03-31 DIAGNOSIS — N186 End stage renal disease: Secondary | ICD-10-CM | POA: Diagnosis not present

## 2021-03-31 DIAGNOSIS — D631 Anemia in chronic kidney disease: Secondary | ICD-10-CM | POA: Diagnosis not present

## 2021-03-31 DIAGNOSIS — Z992 Dependence on renal dialysis: Secondary | ICD-10-CM | POA: Diagnosis not present

## 2021-03-31 DIAGNOSIS — N2581 Secondary hyperparathyroidism of renal origin: Secondary | ICD-10-CM | POA: Diagnosis not present

## 2021-04-03 ENCOUNTER — Other Ambulatory Visit (HOSPITAL_COMMUNITY): Payer: Self-pay

## 2021-04-03 DIAGNOSIS — D631 Anemia in chronic kidney disease: Secondary | ICD-10-CM | POA: Diagnosis not present

## 2021-04-03 DIAGNOSIS — Z992 Dependence on renal dialysis: Secondary | ICD-10-CM | POA: Diagnosis not present

## 2021-04-03 DIAGNOSIS — N2581 Secondary hyperparathyroidism of renal origin: Secondary | ICD-10-CM | POA: Diagnosis not present

## 2021-04-03 DIAGNOSIS — N186 End stage renal disease: Secondary | ICD-10-CM | POA: Diagnosis not present

## 2021-04-03 LAB — CYTOLOGY - PAP
Chlamydia: NEGATIVE
Comment: NEGATIVE
Comment: NEGATIVE
Comment: NORMAL
Diagnosis: NEGATIVE
High risk HPV: NEGATIVE
Neisseria Gonorrhea: NEGATIVE

## 2021-04-05 DIAGNOSIS — N2581 Secondary hyperparathyroidism of renal origin: Secondary | ICD-10-CM | POA: Diagnosis not present

## 2021-04-05 DIAGNOSIS — N186 End stage renal disease: Secondary | ICD-10-CM | POA: Diagnosis not present

## 2021-04-05 DIAGNOSIS — D631 Anemia in chronic kidney disease: Secondary | ICD-10-CM | POA: Diagnosis not present

## 2021-04-05 DIAGNOSIS — Z992 Dependence on renal dialysis: Secondary | ICD-10-CM | POA: Diagnosis not present

## 2021-04-07 DIAGNOSIS — N186 End stage renal disease: Secondary | ICD-10-CM | POA: Diagnosis not present

## 2021-04-07 DIAGNOSIS — N2581 Secondary hyperparathyroidism of renal origin: Secondary | ICD-10-CM | POA: Diagnosis not present

## 2021-04-07 DIAGNOSIS — Z992 Dependence on renal dialysis: Secondary | ICD-10-CM | POA: Diagnosis not present

## 2021-04-07 DIAGNOSIS — D631 Anemia in chronic kidney disease: Secondary | ICD-10-CM | POA: Diagnosis not present

## 2021-04-10 DIAGNOSIS — D631 Anemia in chronic kidney disease: Secondary | ICD-10-CM | POA: Diagnosis not present

## 2021-04-10 DIAGNOSIS — Z992 Dependence on renal dialysis: Secondary | ICD-10-CM | POA: Diagnosis not present

## 2021-04-10 DIAGNOSIS — N186 End stage renal disease: Secondary | ICD-10-CM | POA: Diagnosis not present

## 2021-04-10 DIAGNOSIS — N2581 Secondary hyperparathyroidism of renal origin: Secondary | ICD-10-CM | POA: Diagnosis not present

## 2021-04-12 DIAGNOSIS — N2581 Secondary hyperparathyroidism of renal origin: Secondary | ICD-10-CM | POA: Diagnosis not present

## 2021-04-12 DIAGNOSIS — N186 End stage renal disease: Secondary | ICD-10-CM | POA: Diagnosis not present

## 2021-04-12 DIAGNOSIS — D631 Anemia in chronic kidney disease: Secondary | ICD-10-CM | POA: Diagnosis not present

## 2021-04-12 DIAGNOSIS — I120 Hypertensive chronic kidney disease with stage 5 chronic kidney disease or end stage renal disease: Secondary | ICD-10-CM | POA: Diagnosis not present

## 2021-04-12 DIAGNOSIS — Z992 Dependence on renal dialysis: Secondary | ICD-10-CM | POA: Diagnosis not present

## 2021-04-14 DIAGNOSIS — D631 Anemia in chronic kidney disease: Secondary | ICD-10-CM | POA: Diagnosis not present

## 2021-04-14 DIAGNOSIS — Z992 Dependence on renal dialysis: Secondary | ICD-10-CM | POA: Diagnosis not present

## 2021-04-14 DIAGNOSIS — N186 End stage renal disease: Secondary | ICD-10-CM | POA: Diagnosis not present

## 2021-04-14 DIAGNOSIS — N2581 Secondary hyperparathyroidism of renal origin: Secondary | ICD-10-CM | POA: Diagnosis not present

## 2021-04-17 DIAGNOSIS — Z992 Dependence on renal dialysis: Secondary | ICD-10-CM | POA: Diagnosis not present

## 2021-04-17 DIAGNOSIS — N2581 Secondary hyperparathyroidism of renal origin: Secondary | ICD-10-CM | POA: Diagnosis not present

## 2021-04-17 DIAGNOSIS — N186 End stage renal disease: Secondary | ICD-10-CM | POA: Diagnosis not present

## 2021-04-17 DIAGNOSIS — D631 Anemia in chronic kidney disease: Secondary | ICD-10-CM | POA: Diagnosis not present

## 2021-04-19 DIAGNOSIS — N2581 Secondary hyperparathyroidism of renal origin: Secondary | ICD-10-CM | POA: Diagnosis not present

## 2021-04-19 DIAGNOSIS — N186 End stage renal disease: Secondary | ICD-10-CM | POA: Diagnosis not present

## 2021-04-19 DIAGNOSIS — Z992 Dependence on renal dialysis: Secondary | ICD-10-CM | POA: Diagnosis not present

## 2021-04-19 DIAGNOSIS — D631 Anemia in chronic kidney disease: Secondary | ICD-10-CM | POA: Diagnosis not present

## 2021-04-21 DIAGNOSIS — D631 Anemia in chronic kidney disease: Secondary | ICD-10-CM | POA: Diagnosis not present

## 2021-04-21 DIAGNOSIS — Z992 Dependence on renal dialysis: Secondary | ICD-10-CM | POA: Diagnosis not present

## 2021-04-21 DIAGNOSIS — N2581 Secondary hyperparathyroidism of renal origin: Secondary | ICD-10-CM | POA: Diagnosis not present

## 2021-04-21 DIAGNOSIS — N186 End stage renal disease: Secondary | ICD-10-CM | POA: Diagnosis not present

## 2021-04-24 DIAGNOSIS — Z992 Dependence on renal dialysis: Secondary | ICD-10-CM | POA: Diagnosis not present

## 2021-04-24 DIAGNOSIS — D631 Anemia in chronic kidney disease: Secondary | ICD-10-CM | POA: Diagnosis not present

## 2021-04-24 DIAGNOSIS — N186 End stage renal disease: Secondary | ICD-10-CM | POA: Diagnosis not present

## 2021-04-24 DIAGNOSIS — N2581 Secondary hyperparathyroidism of renal origin: Secondary | ICD-10-CM | POA: Diagnosis not present

## 2021-04-25 ENCOUNTER — Other Ambulatory Visit (HOSPITAL_COMMUNITY): Payer: Self-pay

## 2021-04-26 DIAGNOSIS — D631 Anemia in chronic kidney disease: Secondary | ICD-10-CM | POA: Diagnosis not present

## 2021-04-26 DIAGNOSIS — Z992 Dependence on renal dialysis: Secondary | ICD-10-CM | POA: Diagnosis not present

## 2021-04-26 DIAGNOSIS — N2581 Secondary hyperparathyroidism of renal origin: Secondary | ICD-10-CM | POA: Diagnosis not present

## 2021-04-26 DIAGNOSIS — N186 End stage renal disease: Secondary | ICD-10-CM | POA: Diagnosis not present

## 2021-04-28 DIAGNOSIS — N2581 Secondary hyperparathyroidism of renal origin: Secondary | ICD-10-CM | POA: Diagnosis not present

## 2021-04-28 DIAGNOSIS — D631 Anemia in chronic kidney disease: Secondary | ICD-10-CM | POA: Diagnosis not present

## 2021-04-28 DIAGNOSIS — N186 End stage renal disease: Secondary | ICD-10-CM | POA: Diagnosis not present

## 2021-04-28 DIAGNOSIS — Z992 Dependence on renal dialysis: Secondary | ICD-10-CM | POA: Diagnosis not present

## 2021-05-01 ENCOUNTER — Encounter (HOSPITAL_COMMUNITY): Payer: Self-pay | Admitting: *Deleted

## 2021-05-01 ENCOUNTER — Other Ambulatory Visit (HOSPITAL_COMMUNITY): Payer: Self-pay

## 2021-05-01 DIAGNOSIS — N186 End stage renal disease: Secondary | ICD-10-CM | POA: Diagnosis not present

## 2021-05-01 DIAGNOSIS — D631 Anemia in chronic kidney disease: Secondary | ICD-10-CM | POA: Diagnosis not present

## 2021-05-01 DIAGNOSIS — N2581 Secondary hyperparathyroidism of renal origin: Secondary | ICD-10-CM | POA: Diagnosis not present

## 2021-05-01 DIAGNOSIS — Z992 Dependence on renal dialysis: Secondary | ICD-10-CM | POA: Diagnosis not present

## 2021-05-02 ENCOUNTER — Other Ambulatory Visit (HOSPITAL_COMMUNITY): Payer: Self-pay

## 2021-05-02 ENCOUNTER — Telehealth: Payer: Self-pay

## 2021-05-02 ENCOUNTER — Encounter (HOSPITAL_COMMUNITY): Payer: Self-pay | Admitting: *Deleted

## 2021-05-02 NOTE — Telephone Encounter (Signed)
[facility] Patient Advocate Encounter   I was successful in securing patient a $7500 grant from [employer] ([employer]) to provide copayment coverage for Biktarvy.  This will make the out of pocket cost $0.00.     I have spoken with the patient.    The billing information is as follows and has been shared with [facility].           Patient knows to call the office with questions or concerns.  [name], CPhT [facility] Phone: [phone] Fax:  [phone]  

## 2021-05-03 DIAGNOSIS — Z992 Dependence on renal dialysis: Secondary | ICD-10-CM | POA: Diagnosis not present

## 2021-05-03 DIAGNOSIS — D631 Anemia in chronic kidney disease: Secondary | ICD-10-CM | POA: Diagnosis not present

## 2021-05-03 DIAGNOSIS — N2581 Secondary hyperparathyroidism of renal origin: Secondary | ICD-10-CM | POA: Diagnosis not present

## 2021-05-03 DIAGNOSIS — N186 End stage renal disease: Secondary | ICD-10-CM | POA: Diagnosis not present

## 2021-05-05 DIAGNOSIS — N2581 Secondary hyperparathyroidism of renal origin: Secondary | ICD-10-CM | POA: Diagnosis not present

## 2021-05-05 DIAGNOSIS — Z992 Dependence on renal dialysis: Secondary | ICD-10-CM | POA: Diagnosis not present

## 2021-05-05 DIAGNOSIS — N186 End stage renal disease: Secondary | ICD-10-CM | POA: Diagnosis not present

## 2021-05-05 DIAGNOSIS — D631 Anemia in chronic kidney disease: Secondary | ICD-10-CM | POA: Diagnosis not present

## 2021-05-08 DIAGNOSIS — Z992 Dependence on renal dialysis: Secondary | ICD-10-CM | POA: Diagnosis not present

## 2021-05-08 DIAGNOSIS — N186 End stage renal disease: Secondary | ICD-10-CM | POA: Diagnosis not present

## 2021-05-08 DIAGNOSIS — D631 Anemia in chronic kidney disease: Secondary | ICD-10-CM | POA: Diagnosis not present

## 2021-05-08 DIAGNOSIS — N2581 Secondary hyperparathyroidism of renal origin: Secondary | ICD-10-CM | POA: Diagnosis not present

## 2021-05-10 DIAGNOSIS — Z992 Dependence on renal dialysis: Secondary | ICD-10-CM | POA: Diagnosis not present

## 2021-05-10 DIAGNOSIS — N2581 Secondary hyperparathyroidism of renal origin: Secondary | ICD-10-CM | POA: Diagnosis not present

## 2021-05-10 DIAGNOSIS — N186 End stage renal disease: Secondary | ICD-10-CM | POA: Diagnosis not present

## 2021-05-10 DIAGNOSIS — D631 Anemia in chronic kidney disease: Secondary | ICD-10-CM | POA: Diagnosis not present

## 2021-05-12 DIAGNOSIS — E875 Hyperkalemia: Secondary | ICD-10-CM | POA: Diagnosis not present

## 2021-05-12 DIAGNOSIS — N186 End stage renal disease: Secondary | ICD-10-CM | POA: Diagnosis not present

## 2021-05-12 DIAGNOSIS — N2581 Secondary hyperparathyroidism of renal origin: Secondary | ICD-10-CM | POA: Diagnosis not present

## 2021-05-12 DIAGNOSIS — I120 Hypertensive chronic kidney disease with stage 5 chronic kidney disease or end stage renal disease: Secondary | ICD-10-CM | POA: Diagnosis not present

## 2021-05-12 DIAGNOSIS — D631 Anemia in chronic kidney disease: Secondary | ICD-10-CM | POA: Diagnosis not present

## 2021-05-12 DIAGNOSIS — Z992 Dependence on renal dialysis: Secondary | ICD-10-CM | POA: Diagnosis not present

## 2021-05-15 DIAGNOSIS — E875 Hyperkalemia: Secondary | ICD-10-CM | POA: Diagnosis not present

## 2021-05-15 DIAGNOSIS — Z992 Dependence on renal dialysis: Secondary | ICD-10-CM | POA: Diagnosis not present

## 2021-05-15 DIAGNOSIS — N186 End stage renal disease: Secondary | ICD-10-CM | POA: Diagnosis not present

## 2021-05-15 DIAGNOSIS — D631 Anemia in chronic kidney disease: Secondary | ICD-10-CM | POA: Diagnosis not present

## 2021-05-15 DIAGNOSIS — N2581 Secondary hyperparathyroidism of renal origin: Secondary | ICD-10-CM | POA: Diagnosis not present

## 2021-05-17 DIAGNOSIS — N186 End stage renal disease: Secondary | ICD-10-CM | POA: Diagnosis not present

## 2021-05-17 DIAGNOSIS — N2581 Secondary hyperparathyroidism of renal origin: Secondary | ICD-10-CM | POA: Diagnosis not present

## 2021-05-17 DIAGNOSIS — E875 Hyperkalemia: Secondary | ICD-10-CM | POA: Diagnosis not present

## 2021-05-17 DIAGNOSIS — D631 Anemia in chronic kidney disease: Secondary | ICD-10-CM | POA: Diagnosis not present

## 2021-05-17 DIAGNOSIS — Z992 Dependence on renal dialysis: Secondary | ICD-10-CM | POA: Diagnosis not present

## 2021-05-19 DIAGNOSIS — E875 Hyperkalemia: Secondary | ICD-10-CM | POA: Diagnosis not present

## 2021-05-19 DIAGNOSIS — N186 End stage renal disease: Secondary | ICD-10-CM | POA: Diagnosis not present

## 2021-05-19 DIAGNOSIS — D631 Anemia in chronic kidney disease: Secondary | ICD-10-CM | POA: Diagnosis not present

## 2021-05-19 DIAGNOSIS — N2581 Secondary hyperparathyroidism of renal origin: Secondary | ICD-10-CM | POA: Diagnosis not present

## 2021-05-19 DIAGNOSIS — Z992 Dependence on renal dialysis: Secondary | ICD-10-CM | POA: Diagnosis not present

## 2021-05-22 DIAGNOSIS — E875 Hyperkalemia: Secondary | ICD-10-CM | POA: Diagnosis not present

## 2021-05-22 DIAGNOSIS — N186 End stage renal disease: Secondary | ICD-10-CM | POA: Diagnosis not present

## 2021-05-22 DIAGNOSIS — D631 Anemia in chronic kidney disease: Secondary | ICD-10-CM | POA: Diagnosis not present

## 2021-05-22 DIAGNOSIS — N2581 Secondary hyperparathyroidism of renal origin: Secondary | ICD-10-CM | POA: Diagnosis not present

## 2021-05-22 DIAGNOSIS — Z992 Dependence on renal dialysis: Secondary | ICD-10-CM | POA: Diagnosis not present

## 2021-05-23 ENCOUNTER — Other Ambulatory Visit (HOSPITAL_COMMUNITY): Payer: Self-pay

## 2021-05-24 ENCOUNTER — Other Ambulatory Visit (HOSPITAL_COMMUNITY): Payer: Self-pay

## 2021-05-24 DIAGNOSIS — N2581 Secondary hyperparathyroidism of renal origin: Secondary | ICD-10-CM | POA: Diagnosis not present

## 2021-05-24 DIAGNOSIS — E875 Hyperkalemia: Secondary | ICD-10-CM | POA: Diagnosis not present

## 2021-05-24 DIAGNOSIS — Z992 Dependence on renal dialysis: Secondary | ICD-10-CM | POA: Diagnosis not present

## 2021-05-24 DIAGNOSIS — D631 Anemia in chronic kidney disease: Secondary | ICD-10-CM | POA: Diagnosis not present

## 2021-05-24 DIAGNOSIS — N186 End stage renal disease: Secondary | ICD-10-CM | POA: Diagnosis not present

## 2021-05-26 ENCOUNTER — Other Ambulatory Visit (HOSPITAL_COMMUNITY): Payer: Self-pay

## 2021-05-26 DIAGNOSIS — E875 Hyperkalemia: Secondary | ICD-10-CM | POA: Diagnosis not present

## 2021-05-26 DIAGNOSIS — Z992 Dependence on renal dialysis: Secondary | ICD-10-CM | POA: Diagnosis not present

## 2021-05-26 DIAGNOSIS — D631 Anemia in chronic kidney disease: Secondary | ICD-10-CM | POA: Diagnosis not present

## 2021-05-26 DIAGNOSIS — N2581 Secondary hyperparathyroidism of renal origin: Secondary | ICD-10-CM | POA: Diagnosis not present

## 2021-05-26 DIAGNOSIS — N186 End stage renal disease: Secondary | ICD-10-CM | POA: Diagnosis not present

## 2021-05-29 DIAGNOSIS — N2581 Secondary hyperparathyroidism of renal origin: Secondary | ICD-10-CM | POA: Diagnosis not present

## 2021-05-29 DIAGNOSIS — D631 Anemia in chronic kidney disease: Secondary | ICD-10-CM | POA: Diagnosis not present

## 2021-05-29 DIAGNOSIS — N186 End stage renal disease: Secondary | ICD-10-CM | POA: Diagnosis not present

## 2021-05-29 DIAGNOSIS — Z992 Dependence on renal dialysis: Secondary | ICD-10-CM | POA: Diagnosis not present

## 2021-05-29 DIAGNOSIS — E875 Hyperkalemia: Secondary | ICD-10-CM | POA: Diagnosis not present

## 2021-05-31 DIAGNOSIS — Z992 Dependence on renal dialysis: Secondary | ICD-10-CM | POA: Diagnosis not present

## 2021-05-31 DIAGNOSIS — N2581 Secondary hyperparathyroidism of renal origin: Secondary | ICD-10-CM | POA: Diagnosis not present

## 2021-05-31 DIAGNOSIS — E875 Hyperkalemia: Secondary | ICD-10-CM | POA: Diagnosis not present

## 2021-05-31 DIAGNOSIS — D631 Anemia in chronic kidney disease: Secondary | ICD-10-CM | POA: Diagnosis not present

## 2021-05-31 DIAGNOSIS — N186 End stage renal disease: Secondary | ICD-10-CM | POA: Diagnosis not present

## 2021-06-02 DIAGNOSIS — Z992 Dependence on renal dialysis: Secondary | ICD-10-CM | POA: Diagnosis not present

## 2021-06-02 DIAGNOSIS — E875 Hyperkalemia: Secondary | ICD-10-CM | POA: Diagnosis not present

## 2021-06-02 DIAGNOSIS — N186 End stage renal disease: Secondary | ICD-10-CM | POA: Diagnosis not present

## 2021-06-02 DIAGNOSIS — D631 Anemia in chronic kidney disease: Secondary | ICD-10-CM | POA: Diagnosis not present

## 2021-06-02 DIAGNOSIS — N2581 Secondary hyperparathyroidism of renal origin: Secondary | ICD-10-CM | POA: Diagnosis not present

## 2021-06-05 DIAGNOSIS — N2581 Secondary hyperparathyroidism of renal origin: Secondary | ICD-10-CM | POA: Diagnosis not present

## 2021-06-05 DIAGNOSIS — Z992 Dependence on renal dialysis: Secondary | ICD-10-CM | POA: Diagnosis not present

## 2021-06-05 DIAGNOSIS — D631 Anemia in chronic kidney disease: Secondary | ICD-10-CM | POA: Diagnosis not present

## 2021-06-05 DIAGNOSIS — E875 Hyperkalemia: Secondary | ICD-10-CM | POA: Diagnosis not present

## 2021-06-05 DIAGNOSIS — N186 End stage renal disease: Secondary | ICD-10-CM | POA: Diagnosis not present

## 2021-06-07 DIAGNOSIS — E875 Hyperkalemia: Secondary | ICD-10-CM | POA: Diagnosis not present

## 2021-06-07 DIAGNOSIS — N186 End stage renal disease: Secondary | ICD-10-CM | POA: Diagnosis not present

## 2021-06-07 DIAGNOSIS — Z992 Dependence on renal dialysis: Secondary | ICD-10-CM | POA: Diagnosis not present

## 2021-06-07 DIAGNOSIS — D631 Anemia in chronic kidney disease: Secondary | ICD-10-CM | POA: Diagnosis not present

## 2021-06-07 DIAGNOSIS — N2581 Secondary hyperparathyroidism of renal origin: Secondary | ICD-10-CM | POA: Diagnosis not present

## 2021-06-08 ENCOUNTER — Other Ambulatory Visit (HOSPITAL_COMMUNITY): Payer: Self-pay

## 2021-06-09 DIAGNOSIS — D631 Anemia in chronic kidney disease: Secondary | ICD-10-CM | POA: Diagnosis not present

## 2021-06-09 DIAGNOSIS — Z992 Dependence on renal dialysis: Secondary | ICD-10-CM | POA: Diagnosis not present

## 2021-06-09 DIAGNOSIS — N186 End stage renal disease: Secondary | ICD-10-CM | POA: Diagnosis not present

## 2021-06-09 DIAGNOSIS — N2581 Secondary hyperparathyroidism of renal origin: Secondary | ICD-10-CM | POA: Diagnosis not present

## 2021-06-09 DIAGNOSIS — E875 Hyperkalemia: Secondary | ICD-10-CM | POA: Diagnosis not present

## 2021-06-12 DIAGNOSIS — Z992 Dependence on renal dialysis: Secondary | ICD-10-CM | POA: Diagnosis not present

## 2021-06-12 DIAGNOSIS — D631 Anemia in chronic kidney disease: Secondary | ICD-10-CM | POA: Diagnosis not present

## 2021-06-12 DIAGNOSIS — I120 Hypertensive chronic kidney disease with stage 5 chronic kidney disease or end stage renal disease: Secondary | ICD-10-CM | POA: Diagnosis not present

## 2021-06-12 DIAGNOSIS — N186 End stage renal disease: Secondary | ICD-10-CM | POA: Diagnosis not present

## 2021-06-12 DIAGNOSIS — N2581 Secondary hyperparathyroidism of renal origin: Secondary | ICD-10-CM | POA: Diagnosis not present

## 2021-06-14 DIAGNOSIS — N2581 Secondary hyperparathyroidism of renal origin: Secondary | ICD-10-CM | POA: Diagnosis not present

## 2021-06-14 DIAGNOSIS — Z992 Dependence on renal dialysis: Secondary | ICD-10-CM | POA: Diagnosis not present

## 2021-06-14 DIAGNOSIS — D631 Anemia in chronic kidney disease: Secondary | ICD-10-CM | POA: Diagnosis not present

## 2021-06-14 DIAGNOSIS — N186 End stage renal disease: Secondary | ICD-10-CM | POA: Diagnosis not present

## 2021-06-16 DIAGNOSIS — Z992 Dependence on renal dialysis: Secondary | ICD-10-CM | POA: Diagnosis not present

## 2021-06-16 DIAGNOSIS — N2581 Secondary hyperparathyroidism of renal origin: Secondary | ICD-10-CM | POA: Diagnosis not present

## 2021-06-16 DIAGNOSIS — D631 Anemia in chronic kidney disease: Secondary | ICD-10-CM | POA: Diagnosis not present

## 2021-06-16 DIAGNOSIS — N186 End stage renal disease: Secondary | ICD-10-CM | POA: Diagnosis not present

## 2021-06-19 DIAGNOSIS — Z992 Dependence on renal dialysis: Secondary | ICD-10-CM | POA: Diagnosis not present

## 2021-06-19 DIAGNOSIS — N2581 Secondary hyperparathyroidism of renal origin: Secondary | ICD-10-CM | POA: Diagnosis not present

## 2021-06-19 DIAGNOSIS — N186 End stage renal disease: Secondary | ICD-10-CM | POA: Diagnosis not present

## 2021-06-19 DIAGNOSIS — D631 Anemia in chronic kidney disease: Secondary | ICD-10-CM | POA: Diagnosis not present

## 2021-06-21 DIAGNOSIS — D631 Anemia in chronic kidney disease: Secondary | ICD-10-CM | POA: Diagnosis not present

## 2021-06-21 DIAGNOSIS — N186 End stage renal disease: Secondary | ICD-10-CM | POA: Diagnosis not present

## 2021-06-21 DIAGNOSIS — N2581 Secondary hyperparathyroidism of renal origin: Secondary | ICD-10-CM | POA: Diagnosis not present

## 2021-06-21 DIAGNOSIS — Z992 Dependence on renal dialysis: Secondary | ICD-10-CM | POA: Diagnosis not present

## 2021-06-22 ENCOUNTER — Other Ambulatory Visit (HOSPITAL_COMMUNITY): Payer: Self-pay

## 2021-06-23 DIAGNOSIS — D631 Anemia in chronic kidney disease: Secondary | ICD-10-CM | POA: Diagnosis not present

## 2021-06-23 DIAGNOSIS — N2581 Secondary hyperparathyroidism of renal origin: Secondary | ICD-10-CM | POA: Diagnosis not present

## 2021-06-23 DIAGNOSIS — N186 End stage renal disease: Secondary | ICD-10-CM | POA: Diagnosis not present

## 2021-06-23 DIAGNOSIS — Z992 Dependence on renal dialysis: Secondary | ICD-10-CM | POA: Diagnosis not present

## 2021-06-26 DIAGNOSIS — D631 Anemia in chronic kidney disease: Secondary | ICD-10-CM | POA: Diagnosis not present

## 2021-06-26 DIAGNOSIS — N186 End stage renal disease: Secondary | ICD-10-CM | POA: Diagnosis not present

## 2021-06-26 DIAGNOSIS — Z992 Dependence on renal dialysis: Secondary | ICD-10-CM | POA: Diagnosis not present

## 2021-06-26 DIAGNOSIS — N2581 Secondary hyperparathyroidism of renal origin: Secondary | ICD-10-CM | POA: Diagnosis not present

## 2021-06-28 DIAGNOSIS — N2581 Secondary hyperparathyroidism of renal origin: Secondary | ICD-10-CM | POA: Diagnosis not present

## 2021-06-28 DIAGNOSIS — N186 End stage renal disease: Secondary | ICD-10-CM | POA: Diagnosis not present

## 2021-06-28 DIAGNOSIS — Z992 Dependence on renal dialysis: Secondary | ICD-10-CM | POA: Diagnosis not present

## 2021-06-28 DIAGNOSIS — D631 Anemia in chronic kidney disease: Secondary | ICD-10-CM | POA: Diagnosis not present

## 2021-06-29 ENCOUNTER — Other Ambulatory Visit (HOSPITAL_COMMUNITY): Payer: Self-pay

## 2021-06-30 DIAGNOSIS — N2581 Secondary hyperparathyroidism of renal origin: Secondary | ICD-10-CM | POA: Diagnosis not present

## 2021-06-30 DIAGNOSIS — N186 End stage renal disease: Secondary | ICD-10-CM | POA: Diagnosis not present

## 2021-06-30 DIAGNOSIS — D631 Anemia in chronic kidney disease: Secondary | ICD-10-CM | POA: Diagnosis not present

## 2021-06-30 DIAGNOSIS — Z992 Dependence on renal dialysis: Secondary | ICD-10-CM | POA: Diagnosis not present

## 2021-07-03 DIAGNOSIS — Z992 Dependence on renal dialysis: Secondary | ICD-10-CM | POA: Diagnosis not present

## 2021-07-03 DIAGNOSIS — N186 End stage renal disease: Secondary | ICD-10-CM | POA: Diagnosis not present

## 2021-07-03 DIAGNOSIS — N2581 Secondary hyperparathyroidism of renal origin: Secondary | ICD-10-CM | POA: Diagnosis not present

## 2021-07-03 DIAGNOSIS — D631 Anemia in chronic kidney disease: Secondary | ICD-10-CM | POA: Diagnosis not present

## 2021-07-05 DIAGNOSIS — N186 End stage renal disease: Secondary | ICD-10-CM | POA: Diagnosis not present

## 2021-07-05 DIAGNOSIS — N2581 Secondary hyperparathyroidism of renal origin: Secondary | ICD-10-CM | POA: Diagnosis not present

## 2021-07-05 DIAGNOSIS — Z992 Dependence on renal dialysis: Secondary | ICD-10-CM | POA: Diagnosis not present

## 2021-07-05 DIAGNOSIS — D631 Anemia in chronic kidney disease: Secondary | ICD-10-CM | POA: Diagnosis not present

## 2021-07-06 ENCOUNTER — Other Ambulatory Visit: Payer: Self-pay

## 2021-07-06 ENCOUNTER — Ambulatory Visit (INDEPENDENT_AMBULATORY_CARE_PROVIDER_SITE_OTHER): Payer: Medicare Other | Admitting: Cardiology

## 2021-07-06 ENCOUNTER — Encounter: Payer: Self-pay | Admitting: Cardiology

## 2021-07-06 VITALS — BP 122/80 | HR 94 | Ht 61.0 in | Wt 193.0 lb

## 2021-07-06 DIAGNOSIS — I471 Supraventricular tachycardia: Secondary | ICD-10-CM | POA: Diagnosis not present

## 2021-07-06 NOTE — Patient Instructions (Signed)
Medication Instructions:  NO CHANGES *If you need a refill on your cardiac medications before your next appointment, please call your pharmacy*   Lab Work: NONE If you have labs (blood work) drawn today and your tests are completely normal, you will receive your results only by: [place] (if you have MyChart) OR A paper copy in the mail If you have any lab test that is abnormal or we need to change your treatment, we will call you to review the results.   Testing/Procedures: NONE   Follow-Up: At [facility], you and your health needs are our priority.  As part of our continuing mission to provide you with exceptional heart care, we have created designated Provider Care Teams.  These Care Teams include your primary Cardiologist (physician) and Advanced Practice Providers (APPs -  Physician Assistants and Nurse Practitioners) who all work together to provide you with the care you need, when you need it.  We recommend signing up for the patient portal called "MyChart".  Sign up information is provided on this After Visit Summary.  MyChart is used to connect with patients for Virtual Visits (Telemedicine).  Patients are able to view lab/test results, encounter notes, upcoming appointments, etc.  Non-urgent messages can be sent to your provider as well.   To learn more about what you can do with MyChart, go to [email or web address].    Your next appointment:   1 year(s)  The format for your next appointment:   In Person  Provider:  ANDY TILLERY RENE [place]

## 2021-07-06 NOTE — Progress Notes (Signed)
Electrophysiology Office Note   Date:  07/06/2021   ID:  Tiffany Golden, DOB November 18, 1975, MRN [id number]  PCP:  Tiffany Drown, MD  Cardiologist:   Primary Electrophysiologist:  Tiffany Chauca Meredith Leeds, MD    Chief Complaint: SVT   History of Present Illness: Tiffany Golden is a 45 y.o. female who is being seen today for the evaluation of SVT at the request of Luking, Tiffany Snare, MD. Presenting today for electrophysiology evaluation.  She has a history significant for HIV, end-stage renal disease on dialysis, prior stroke, and TIA.  She developed chest pain during dialysis in SVT with heart rates in of 220 bpm.  She was given adenosine which terminated her SVT.  This also occurred in 2017.  She was having dialysis 01/01/2021 when she went into SVT.  She developed chest pain and shortness of breath.  She tried vagal maneuvers but this did not convert her.  She was sent to the emergency room but had since converted to sinus rhythm.  Today, denies symptoms of palpitations, chest pain, shortness of breath, orthopnea, PND, lower extremity edema, claudication, dizziness, presyncope, syncope, bleeding, or neurologic sequela. The patient is tolerating medications without difficulties.  She is currently feeling well.  She has no chest pain or shortness of breath.  She palpitations.  She is noted no further episodes of SVT and has not had to take any diltiazem.   Past Medical History:  Diagnosis Date   Anemia    Chronic kidney disease    Complication of anesthesia    Dialysis patient ([place])    mon, wed friday   ESRD (end stage renal disease) ([place])    HIV infection ([place])    Hypertension    PONV (postoperative nausea and vomiting)    Stroke ([place])    TIA (transient ischemic attack)    Hx:of   Past Surgical History:  Procedure Laterality Date   ARTERIOVENOUS GRAFT PLACEMENT  09/11/11   left arm   CESAREAN SECTION     DILITATION & CURRETTAGE/HYSTROSCOPY WITH NOVASURE ABLATION N/A 05/03/2015    Procedure: DILATATION/HYSTEROSCOPY WITH NOVASURE ABLATION; uterine cavity length 5.0 cm, uterine cavity width 3.8 cm, power 105 watts; time 1 minute 12 seconds;  Surgeon: Tiffany Kind, MD;  Location: [facility];  Service: Gynecology;  Laterality: N/A;   FISTULA SUPERFICIALIZATION Left 123XX123   Procedure: PLICATION OF LEFT ARM FISTULA;  Surgeon: Tiffany Robins, MD;  Location: [facility];  Service: Vascular;  Laterality: Left;   INSERTION OF DIALYSIS CATHETER Right 09/01/2013   Procedure: INSERTION OF DIALYSIS CATHETER Right Internal Jugular;  Surgeon: Tiffany Dutch, MD;  Location: [place];  Service: Vascular;  Laterality: Right;   PATCH ANGIOPLASTY Left 09/01/2013   Procedure: PATCH ANGIOPLASTY;  Surgeon: Tiffany Dutch, MD;  Location: [place];  Service: Vascular;  Laterality: Left;   REVISON OF ARTERIOVENOUS FISTULA Left 123456   Procedure: Plication left arm fistula;  Surgeon: Tiffany Dutch, MD;  Location: [facility] OR;  Service: Vascular;  Laterality: Left;     Current Outpatient Medications  Medication Sig Dispense Refill   B Complex-C-Folic Acid (RENA-VITE RX) 1 MG TABS Take 1 tablet by mouth daily.     bictegravir-emtricitabine-tenofovir AF (BIKTARVY) 50-200-25 MG TABS tablet TAKE 1 TABLET BY MOUTH DAILY. 30 tablet 5   calcium carbonate (TUMS - DOSED IN MG ELEMENTAL CALCIUM) 500 MG chewable tablet Chew 1-2 tablets by mouth daily as needed for indigestion or heartburn.     diltiazem (CARDIZEM)  30 MG tablet Take 1 tablet (30 mg total) by mouth every 6 (six) hours as needed (for elevated heart rates). 30 tablet 1   metoprolol tartrate (LOPRESSOR) 25 MG tablet Take 12.5 mg by mouth 2 (two) times daily as needed (for blood pressure). Except takes none on dialysis days (Tuesday, Thursday, and Sunday).     sertraline (ZOLOFT) 100 MG tablet Take 1 tablet (100 mg total) by mouth daily. 30 tablet 5   No current facility-administered medications for this visit.    Allergies:   Fortaz [ceftazidime  sodium in dextrose], Sulfa antibiotics, and Vancomycin   Social History:  The patient  reports that she quit smoking about 11 years ago. Her smoking use included cigarettes. She has a 1.50 pack-year smoking history. She has never used smokeless tobacco. She reports that she does not drink alcohol and does not use drugs.   Family History:  The patient's family history includes Cancer - Other in her mother; Heart disease in her father; Hyperlipidemia in her mother.   ROS:  Please see the history of present illness.   Otherwise, review of systems is positive for none.   All other systems are reviewed and negative.   PHYSICAL EXAM: VS:  BP 122/80   Pulse 94   Ht '5\' 1"'$  (1.549 m)   Wt 193 lb (87.5 kg)   SpO2 98%   BMI 36.47 kg/m  , BMI Body mass index is 36.47 kg/m. GEN: Well nourished, well developed, in no acute distress  HEENT: normal  Neck: no JVD, carotid bruits, or masses Cardiac: RRR; no murmurs, rubs, or gallops,no edema  Respiratory:  clear to auscultation bilaterally, normal work of breathing GI: soft, nontender, nondistended, + BS MS: no deformity or atrophy  Skin: warm and dry Neuro:  Strength and sensation are intact Psych: euthymic mood, full affect  EKG:  EKG is ordered today. Personal review of the ekg ordered shows sinus rhythm, rate 94  Recent Labs: 01/24/2021: Platelets 222 02/23/2021: BUN 34; Creatinine, Ser 8.00; Hemoglobin 14.6; Potassium 5.1; Sodium 136    Lipid Panel     Component Value Date/Time   CHOL 200 (H) 12/29/2019 0856   CHOL 184 01/30/2018 1058   TRIG 92 12/29/2019 0856   HDL 46 (L) 12/29/2019 0856   HDL 48 01/30/2018 1058   CHOLHDL 4.3 12/29/2019 0856   VLDL 21 06/30/2015 1027   LDLCALC 134 (H) 12/29/2019 0856     Wt Readings from Last 3 Encounters:  07/06/21 193 lb (87.5 kg)  03/30/21 191 lb (86.6 kg)  03/14/21 190 lb 4.8 oz (86.3 kg)      Other studies Reviewed: Additional studies/ records that were reviewed today include: TTE 2015   Review of the above records today demonstrates:  - Left ventricle: The cavity size was normal. Wall thickness was    normal. Systolic function was normal. The estimated ejection    fraction was in the range of 55% to 60%.  - Mitral valve: There was mild regurgitation.      ASSESSMENT AND PLAN:  1.  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  Had an episode right after dialysis that lasted approximately 5 minutes.  Currently on as needed diltiazem.  She has had no further episodes of SVT.  She is currently felt well and has not needed to take her diltiazem.  No changes.   Current medicines are reviewed at length with the patient today.   The patient does not have concerns regarding her medicines.  The  following changes were made today: None  Labs/ tests ordered today include:  Orders Placed This Encounter  Procedures   EKG 12-Lead      Disposition:   FU with Dewie Ahart 12 months  Signed, Wilborn Membreno Meredith Leeds, MD  07/06/2021 2:36 PM     [place] [address] [place] [address] [phone] (office) [phone] (fax)

## 2021-07-07 DIAGNOSIS — D631 Anemia in chronic kidney disease: Secondary | ICD-10-CM | POA: Diagnosis not present

## 2021-07-07 DIAGNOSIS — N2581 Secondary hyperparathyroidism of renal origin: Secondary | ICD-10-CM | POA: Diagnosis not present

## 2021-07-07 DIAGNOSIS — Z992 Dependence on renal dialysis: Secondary | ICD-10-CM | POA: Diagnosis not present

## 2021-07-07 DIAGNOSIS — N186 End stage renal disease: Secondary | ICD-10-CM | POA: Diagnosis not present

## 2021-07-10 DIAGNOSIS — N2581 Secondary hyperparathyroidism of renal origin: Secondary | ICD-10-CM | POA: Diagnosis not present

## 2021-07-10 DIAGNOSIS — Z992 Dependence on renal dialysis: Secondary | ICD-10-CM | POA: Diagnosis not present

## 2021-07-10 DIAGNOSIS — D631 Anemia in chronic kidney disease: Secondary | ICD-10-CM | POA: Diagnosis not present

## 2021-07-10 DIAGNOSIS — N186 End stage renal disease: Secondary | ICD-10-CM | POA: Diagnosis not present

## 2021-07-12 DIAGNOSIS — N2581 Secondary hyperparathyroidism of renal origin: Secondary | ICD-10-CM | POA: Diagnosis not present

## 2021-07-12 DIAGNOSIS — Z992 Dependence on renal dialysis: Secondary | ICD-10-CM | POA: Diagnosis not present

## 2021-07-12 DIAGNOSIS — N186 End stage renal disease: Secondary | ICD-10-CM | POA: Diagnosis not present

## 2021-07-12 DIAGNOSIS — D631 Anemia in chronic kidney disease: Secondary | ICD-10-CM | POA: Diagnosis not present

## 2021-07-13 DIAGNOSIS — I120 Hypertensive chronic kidney disease with stage 5 chronic kidney disease or end stage renal disease: Secondary | ICD-10-CM | POA: Diagnosis not present

## 2021-07-13 DIAGNOSIS — N186 End stage renal disease: Secondary | ICD-10-CM | POA: Diagnosis not present

## 2021-07-13 DIAGNOSIS — Z992 Dependence on renal dialysis: Secondary | ICD-10-CM | POA: Diagnosis not present

## 2021-07-14 DIAGNOSIS — Z992 Dependence on renal dialysis: Secondary | ICD-10-CM | POA: Diagnosis not present

## 2021-07-14 DIAGNOSIS — N2581 Secondary hyperparathyroidism of renal origin: Secondary | ICD-10-CM | POA: Diagnosis not present

## 2021-07-14 DIAGNOSIS — D631 Anemia in chronic kidney disease: Secondary | ICD-10-CM | POA: Diagnosis not present

## 2021-07-14 DIAGNOSIS — N186 End stage renal disease: Secondary | ICD-10-CM | POA: Diagnosis not present

## 2021-07-17 DIAGNOSIS — N186 End stage renal disease: Secondary | ICD-10-CM | POA: Diagnosis not present

## 2021-07-17 DIAGNOSIS — N2581 Secondary hyperparathyroidism of renal origin: Secondary | ICD-10-CM | POA: Diagnosis not present

## 2021-07-17 DIAGNOSIS — Z992 Dependence on renal dialysis: Secondary | ICD-10-CM | POA: Diagnosis not present

## 2021-07-17 DIAGNOSIS — D631 Anemia in chronic kidney disease: Secondary | ICD-10-CM | POA: Diagnosis not present

## 2021-07-19 DIAGNOSIS — N2581 Secondary hyperparathyroidism of renal origin: Secondary | ICD-10-CM | POA: Diagnosis not present

## 2021-07-19 DIAGNOSIS — Z992 Dependence on renal dialysis: Secondary | ICD-10-CM | POA: Diagnosis not present

## 2021-07-19 DIAGNOSIS — D631 Anemia in chronic kidney disease: Secondary | ICD-10-CM | POA: Diagnosis not present

## 2021-07-19 DIAGNOSIS — N186 End stage renal disease: Secondary | ICD-10-CM | POA: Diagnosis not present

## 2021-07-21 DIAGNOSIS — N186 End stage renal disease: Secondary | ICD-10-CM | POA: Diagnosis not present

## 2021-07-21 DIAGNOSIS — Z992 Dependence on renal dialysis: Secondary | ICD-10-CM | POA: Diagnosis not present

## 2021-07-21 DIAGNOSIS — D631 Anemia in chronic kidney disease: Secondary | ICD-10-CM | POA: Diagnosis not present

## 2021-07-21 DIAGNOSIS — N2581 Secondary hyperparathyroidism of renal origin: Secondary | ICD-10-CM | POA: Diagnosis not present

## 2021-07-24 ENCOUNTER — Other Ambulatory Visit (HOSPITAL_COMMUNITY): Payer: Self-pay

## 2021-07-24 DIAGNOSIS — D631 Anemia in chronic kidney disease: Secondary | ICD-10-CM | POA: Diagnosis not present

## 2021-07-24 DIAGNOSIS — N186 End stage renal disease: Secondary | ICD-10-CM | POA: Diagnosis not present

## 2021-07-24 DIAGNOSIS — N2581 Secondary hyperparathyroidism of renal origin: Secondary | ICD-10-CM | POA: Diagnosis not present

## 2021-07-24 DIAGNOSIS — Z992 Dependence on renal dialysis: Secondary | ICD-10-CM | POA: Diagnosis not present

## 2021-07-26 DIAGNOSIS — Z992 Dependence on renal dialysis: Secondary | ICD-10-CM | POA: Diagnosis not present

## 2021-07-26 DIAGNOSIS — N2581 Secondary hyperparathyroidism of renal origin: Secondary | ICD-10-CM | POA: Diagnosis not present

## 2021-07-26 DIAGNOSIS — N186 End stage renal disease: Secondary | ICD-10-CM | POA: Diagnosis not present

## 2021-07-26 DIAGNOSIS — D631 Anemia in chronic kidney disease: Secondary | ICD-10-CM | POA: Diagnosis not present

## 2021-07-28 DIAGNOSIS — D631 Anemia in chronic kidney disease: Secondary | ICD-10-CM | POA: Diagnosis not present

## 2021-07-28 DIAGNOSIS — Z992 Dependence on renal dialysis: Secondary | ICD-10-CM | POA: Diagnosis not present

## 2021-07-28 DIAGNOSIS — N186 End stage renal disease: Secondary | ICD-10-CM | POA: Diagnosis not present

## 2021-07-28 DIAGNOSIS — N2581 Secondary hyperparathyroidism of renal origin: Secondary | ICD-10-CM | POA: Diagnosis not present

## 2021-07-31 ENCOUNTER — Other Ambulatory Visit (HOSPITAL_COMMUNITY): Payer: Self-pay

## 2021-07-31 DIAGNOSIS — N2581 Secondary hyperparathyroidism of renal origin: Secondary | ICD-10-CM | POA: Diagnosis not present

## 2021-07-31 DIAGNOSIS — D631 Anemia in chronic kidney disease: Secondary | ICD-10-CM | POA: Diagnosis not present

## 2021-07-31 DIAGNOSIS — N186 End stage renal disease: Secondary | ICD-10-CM | POA: Diagnosis not present

## 2021-07-31 DIAGNOSIS — Z992 Dependence on renal dialysis: Secondary | ICD-10-CM | POA: Diagnosis not present

## 2021-08-02 DIAGNOSIS — N2581 Secondary hyperparathyroidism of renal origin: Secondary | ICD-10-CM | POA: Diagnosis not present

## 2021-08-02 DIAGNOSIS — N186 End stage renal disease: Secondary | ICD-10-CM | POA: Diagnosis not present

## 2021-08-02 DIAGNOSIS — D631 Anemia in chronic kidney disease: Secondary | ICD-10-CM | POA: Diagnosis not present

## 2021-08-02 DIAGNOSIS — Z992 Dependence on renal dialysis: Secondary | ICD-10-CM | POA: Diagnosis not present

## 2021-08-04 DIAGNOSIS — N186 End stage renal disease: Secondary | ICD-10-CM | POA: Diagnosis not present

## 2021-08-04 DIAGNOSIS — N2581 Secondary hyperparathyroidism of renal origin: Secondary | ICD-10-CM | POA: Diagnosis not present

## 2021-08-04 DIAGNOSIS — Z992 Dependence on renal dialysis: Secondary | ICD-10-CM | POA: Diagnosis not present

## 2021-08-04 DIAGNOSIS — D631 Anemia in chronic kidney disease: Secondary | ICD-10-CM | POA: Diagnosis not present

## 2021-08-07 DIAGNOSIS — Z992 Dependence on renal dialysis: Secondary | ICD-10-CM | POA: Diagnosis not present

## 2021-08-07 DIAGNOSIS — N186 End stage renal disease: Secondary | ICD-10-CM | POA: Diagnosis not present

## 2021-08-07 DIAGNOSIS — N2581 Secondary hyperparathyroidism of renal origin: Secondary | ICD-10-CM | POA: Diagnosis not present

## 2021-08-07 DIAGNOSIS — D631 Anemia in chronic kidney disease: Secondary | ICD-10-CM | POA: Diagnosis not present

## 2021-08-08 ENCOUNTER — Other Ambulatory Visit (HOSPITAL_COMMUNITY): Payer: Self-pay

## 2021-08-08 ENCOUNTER — Ambulatory Visit: Payer: Medicare Other

## 2021-08-08 ENCOUNTER — Encounter: Payer: Self-pay | Admitting: Infectious Disease

## 2021-08-08 ENCOUNTER — Other Ambulatory Visit: Payer: Self-pay

## 2021-08-08 ENCOUNTER — Ambulatory Visit (INDEPENDENT_AMBULATORY_CARE_PROVIDER_SITE_OTHER): Payer: Medicare Other | Admitting: Adult Health

## 2021-08-08 ENCOUNTER — Encounter: Payer: Self-pay | Admitting: Adult Health

## 2021-08-08 ENCOUNTER — Ambulatory Visit (INDEPENDENT_AMBULATORY_CARE_PROVIDER_SITE_OTHER): Payer: Medicare Other | Admitting: Infectious Disease

## 2021-08-08 VITALS — BP 139/92 | HR 69 | Ht 61.0 in | Wt 194.0 lb

## 2021-08-08 VITALS — BP 129/83 | HR 84 | Temp 98.2°F | Wt 193.0 lb

## 2021-08-08 DIAGNOSIS — I10 Essential (primary) hypertension: Secondary | ICD-10-CM | POA: Diagnosis not present

## 2021-08-08 DIAGNOSIS — L709 Acne, unspecified: Secondary | ICD-10-CM | POA: Diagnosis not present

## 2021-08-08 DIAGNOSIS — Z7185 Encounter for immunization safety counseling: Secondary | ICD-10-CM

## 2021-08-08 DIAGNOSIS — B20 Human immunodeficiency virus [HIV] disease: Secondary | ICD-10-CM | POA: Diagnosis not present

## 2021-08-08 DIAGNOSIS — L299 Pruritus, unspecified: Secondary | ICD-10-CM

## 2021-08-08 DIAGNOSIS — N186 End stage renal disease: Secondary | ICD-10-CM

## 2021-08-08 DIAGNOSIS — R232 Flushing: Secondary | ICD-10-CM | POA: Diagnosis not present

## 2021-08-08 DIAGNOSIS — R61 Generalized hyperhidrosis: Secondary | ICD-10-CM

## 2021-08-08 DIAGNOSIS — Z23 Encounter for immunization: Secondary | ICD-10-CM

## 2021-08-08 DIAGNOSIS — Z992 Dependence on renal dialysis: Secondary | ICD-10-CM | POA: Diagnosis not present

## 2021-08-08 MED ORDER — BIKTARVY 50-200-25 MG PO TABS
1.0000 | ORAL_TABLET | Freq: Every day | ORAL | 11 refills | Status: DC
  Filled 2021-08-08 – 2021-08-23 (×2): qty 30, 30d supply, fill #0
  Filled 2021-09-25: qty 30, 30d supply, fill #1
  Filled 2021-10-18: qty 30, 30d supply, fill #2
  Filled 2021-11-21: qty 30, 30d supply, fill #3
  Filled 2021-12-21: qty 30, 30d supply, fill #4

## 2021-08-08 MED ORDER — HYDROXYZINE HCL 10 MG PO TABS: 10.0000 mg | ORAL_TABLET | Freq: Three times a day (TID) | ORAL | 1 refills | Status: DC | PRN

## 2021-08-08 NOTE — Progress Notes (Signed)
  Subjective:     Patient ID: Tiffany Golden, female   DOB: [birth year], 45 y.o.   MRN: [id number]  HPI Tiffany Golden is a 45 year old black female, single, G1P1,in complaining of hot flashes and night sweats, acne and itching. She has a history of stroke, and CKD. PCP is Dr Wolfgang Phoenix. Lab Results  Component Value Date   DIAGPAP  03/30/2021    - Negative for intraepithelial lesion or malignancy (NILM)   HPV NOT DETECTED 02/06/2018   [place] Negative 03/30/2021    Review of Systems +hot flashes and night sweats +acne +itching all over for about 2 months now Reviewed past medical,surgical, social and family history. Reviewed medications and allergies.     Objective:   Physical Exam BP (!) 139/92 (BP Location: Right Arm, Patient Position: Sitting, Cuff Size: Normal)   Pulse 69   Ht '5\' 1"'$  (1.549 m)   Wt 194 lb (88 kg)   BMI 36.66 kg/m  Skin warm and dry. Neck: mid line trachea, normal thyroid, good ROM, no lymphadenopathy noted. Lungs: clear to ausculation bilaterally. Cardiovascular: regular rate and rhythm.    Has some tiny bumps under skin on forehead. Fall risk is low  Upstream - 08/08/21 1121       Pregnancy Intention Screening   Does the patient want to become pregnant in the next year? No    Does the patient's partner want to become pregnant in the next year? No    Would the patient like to discuss contraceptive options today? Yes      Contraception Wrap Up   Current Method Abstinence;Female Sterilization    End Method Abstinence;Female Sterilization    Contraception Counseling Provided Yes             Assessment:  1. Hot flashes Will check labs, she is aware can not take estrogen due to stroke - TSH - Follicle stimulating hormone - T4, free  2. Night sweats  - Follicle stimulating hormone  3. Acne, unspecified acne type  - TSH  4. Itching  - Comprehensive metabolic panel - TSH - T4, free    Will rx vistaril Meds ordered this encounter  Medications    hydrOXYzine (ATARAX/VISTARIL) 10 MG tablet    Sig: Take 1 tablet (10 mg total) by mouth every 8 (eight) hours as needed.    Dispense:  60 tablet    Refill:  1    Order Specific Question:   Supervising Provider    Answer:   Florian Buff [[id number]]    Plan:     Will talk when labs back and follow up in 4 weeks

## 2021-08-08 NOTE — Progress Notes (Signed)
   Covid-19 Vaccination Clinic  Name:  Tiffany Golden    MRN: [id number] DOB: 05-29-1976  08/08/2021  [name] was observed post Covid-19 immunization for 15 minutes without incident. She was provided with Vaccine Information Sheet and instruction to access the V-Safe system.   [name] was instructed to call 911 with any severe reactions post vaccine: Difficulty breathing  Swelling of face and throat  A fast heartbeat  A bad rash all over body  Dizziness and weakness  Leatrice Jewels, RMA

## 2021-08-08 NOTE — Progress Notes (Signed)
Subjective:   Chief complaint follow-up for HIV disease on medications   Patient ID: Tiffany Golden, female    DOB: [birth year], 45 y.o.   MRN: [id number]  HPI  45y.o. female past medical history significant for HIV and AIDS that was diagnosed approximately 20 years ago.   She has been on various antiretroviral regimens and has had virological failure with resistance.  She has maintained perfect virological suppression on Biktarvy with 2 active drugs--(she has 180 4V on genotype from March 05, 2017)   She remains on dialysis but is on the transplant list at [facility].       Past Medical History:  Diagnosis Date   Anemia    Chronic kidney disease    Complication of anesthesia    Dialysis patient ([place])    mon, wed friday   ESRD (end stage renal disease) ([place])    HIV infection ([place])    Hypertension    PONV (postoperative nausea and vomiting)    Stroke ([place])    TIA (transient ischemic attack)    Hx:of    Past Surgical History:  Procedure Laterality Date   ARTERIOVENOUS GRAFT PLACEMENT  09/11/11   left arm   CESAREAN SECTION     DILITATION & CURRETTAGE/HYSTROSCOPY WITH NOVASURE ABLATION N/A 05/03/2015   Procedure: DILATATION/HYSTEROSCOPY WITH NOVASURE ABLATION; uterine cavity length 5.0 cm, uterine cavity width 3.8 cm, power 105 watts; time 1 minute 12 seconds;  Surgeon: Jonnie Kind, MD;  Location: [facility];  Service: Gynecology;  Laterality: N/A;   FISTULA SUPERFICIALIZATION Left 123XX123   Procedure: PLICATION OF LEFT ARM FISTULA;  Surgeon: Cherre Robins, MD;  Location: [place];  Service: Vascular;  Laterality: Left;   INSERTION OF DIALYSIS CATHETER Right 09/01/2013   Procedure: INSERTION OF DIALYSIS CATHETER Right Internal Jugular;  Surgeon: Elam Dutch, MD;  Location: [place];  Service: Vascular;  Laterality: Right;   PATCH ANGIOPLASTY Left 09/01/2013   Procedure: PATCH ANGIOPLASTY;  Surgeon: Elam Dutch, MD;  Location: [place];  Service:  Vascular;  Laterality: Left;   REVISON OF ARTERIOVENOUS FISTULA Left 123456   Procedure: Plication left arm fistula;  Surgeon: Elam Dutch, MD;  Location: [facility];  Service: Vascular;  Laterality: Left;    Family History  Problem Relation Age of Onset   Hyperlipidemia Mother    Cancer - Other Mother        Hx partial hysterectomy   Heart disease Father        Hx CABG      Social History   Socioeconomic History   Marital status: Single    Spouse name: Not on file   Number of children: Not on file   Years of education: Not on file   Highest education level: Not on file  Occupational History   Not on file  Tobacco Use   Smoking status: Former    Packs/day: 0.50    Years: 3.00    Pack years: 1.50    Types: Cigarettes    Quit date: 09/05/2009    Years since quitting: 11.9   Smokeless tobacco: Never  Vaping Use   Vaping Use: Never used  Substance and Sexual Activity   Alcohol use: No   Drug use: No   Sexual activity: Not Currently    Partners: Male    Birth control/protection: Abstinence    Comment: declined condoms  Other Topics Concern   Not on file  Social History Narrative   Not on  file   Social Determinants of Health   Financial Resource Strain: Low Risk    Difficulty of Paying Living Expenses: Not hard at all  Food Insecurity: No Food Insecurity   Worried About Charity fundraiser in the Last Year: Never true   Arboriculturist in the Last Year: Never true  Transportation Needs: No Transportation Needs   Lack of Transportation (Medical): No   Lack of Transportation (Non-Medical): No  Physical Activity: Insufficiently Active   Days of Exercise per Week: 4 days   Minutes of Exercise per Session: 30 min  Stress: Stress Concern Present   Feeling of Stress : To some extent  Social Connections: Moderately Integrated   Frequency of Communication with Friends and Family: More than three times a week   Frequency of Social Gatherings with Friends and  Family: Once a week   Attends Religious Services: 1 to 4 times per year   Active Member of [employer] or Organizations: Yes   Attends Music therapist: More than 4 times per year   Marital Status: Never married    Allergies  Allergen Reactions   Fortaz [Ceftazidime Sodium In Dextrose] Rash    Head-toe   Sulfa Antibiotics Hives   Vancomycin Rash    Head-toe     Current Outpatient Medications:    B Complex-C-Folic Acid (RENA-VITE RX) 1 MG TABS, Take 1 tablet by mouth daily., Disp: , Rfl:    calcium carbonate (TUMS - DOSED IN MG ELEMENTAL CALCIUM) 500 MG chewable tablet, Chew 1-2 tablets by mouth daily as needed for indigestion or heartburn., Disp: , Rfl:    diltiazem (CARDIZEM) 30 MG tablet, Take 1 tablet (30 mg total) by mouth every 6 (six) hours as needed (for elevated heart rates)., Disp: 30 tablet, Rfl: 1   metoprolol tartrate (LOPRESSOR) 25 MG tablet, Take 12.5 mg by mouth 2 (two) times daily as needed (for blood pressure). Except takes none on dialysis days (Tuesday, Thursday, and Sunday)., Disp: , Rfl:    sertraline (ZOLOFT) 100 MG tablet, Take 1 tablet (100 mg total) by mouth daily., Disp: 30 tablet, Rfl: 5   bictegravir-emtricitabine-tenofovir AF (BIKTARVY) 50-200-25 MG TABS tablet, TAKE 1 TABLET BY MOUTH DAILY., Disp: 30 tablet, Rfl: 11    Review of Systems  Constitutional:  Negative for activity change, appetite change, chills, diaphoresis, fatigue, fever and unexpected weight change.  HENT:  Negative for congestion, rhinorrhea, sinus pressure, sneezing, sore throat and trouble swallowing.   Eyes:  Negative for photophobia and visual disturbance.  Respiratory:  Negative for cough, chest tightness, shortness of breath, wheezing and stridor.   Cardiovascular:  Negative for chest pain, palpitations and leg swelling.  Gastrointestinal:  Negative for abdominal distention, abdominal pain, anal bleeding, blood in stool, constipation, diarrhea, nausea and vomiting.   Genitourinary:  Negative for difficulty urinating, dysuria, flank pain and hematuria.  Musculoskeletal:  Negative for arthralgias, back pain, gait problem, joint swelling and myalgias.  Skin:  Negative for color change, pallor, rash and wound.  Neurological:  Negative for dizziness, tremors, weakness and light-headedness.  Hematological:  Negative for adenopathy. Does not bruise/bleed easily.  Psychiatric/Behavioral:  Negative for agitation, behavioral problems, confusion, decreased concentration, dysphoric mood and sleep disturbance.       Objective:   Physical Exam Vitals reviewed.  Constitutional:      Appearance: Normal appearance. She is obese.  Eyes:     General:        Right eye: No discharge.  Left eye: No discharge.     Extraocular Movements: Extraocular movements intact.     Pupils: Pupils are equal, round, and reactive to light.  Cardiovascular:     Rate and Rhythm: Normal rate and regular rhythm.  Pulmonary:     Effort: No respiratory distress.     Breath sounds: No wheezing.  Abdominal:     General: There is no distension.  Skin:    Coloration: Skin is not jaundiced or pale.  Neurological:     General: No focal deficit present.     Mental Status: She is alert and oriented to person, place, and time.  Psychiatric:        Mood and Affect: Mood normal.        Behavior: Behavior normal.        Thought Content: Thought content normal.        Judgment: Judgment normal.          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  We wish to go to a tenofovir disoproxil fumarate gym and in light of kidney transplantation could consider TIVICAY and Pifeltro versus JULUCA   Hypertension: Much better controlled on metoprolol Vitals:   08/08/21 0926   BP: 129/83  Pulse: 84  Temp: 98.2 F (36.8 C)  SpO2: 98%     End-stage renal disease on hemodialysis, considering renal transplantation  Vaccine counseling we gave her vaccination for COVID-19 today with bivalent booster

## 2021-08-09 DIAGNOSIS — Z992 Dependence on renal dialysis: Secondary | ICD-10-CM | POA: Diagnosis not present

## 2021-08-09 DIAGNOSIS — N186 End stage renal disease: Secondary | ICD-10-CM | POA: Diagnosis not present

## 2021-08-09 DIAGNOSIS — D631 Anemia in chronic kidney disease: Secondary | ICD-10-CM | POA: Diagnosis not present

## 2021-08-09 DIAGNOSIS — N2581 Secondary hyperparathyroidism of renal origin: Secondary | ICD-10-CM | POA: Diagnosis not present

## 2021-08-09 LAB — COMPREHENSIVE METABOLIC PANEL
ALT: 13 IU/L (ref 0–32)
AST: 18 IU/L (ref 0–40)
Albumin/Globulin Ratio: 1.7 (ref 1.2–2.2)
Albumin: 4.6 g/dL (ref 3.8–4.8)
Alkaline Phosphatase: 90 IU/L (ref 44–121)
BUN/Creatinine Ratio: 4 — ABNORMAL LOW (ref 9–23)
BUN: 31 mg/dL — ABNORMAL HIGH (ref 6–24)
Bilirubin Total: 0.4 mg/dL (ref 0.0–1.2)
CO2: 29 mmol/L (ref 20–29)
Calcium: 10.1 mg/dL (ref 8.7–10.2)
Chloride: 91 mmol/L — ABNORMAL LOW (ref 96–106)
Creatinine, Ser: 8.33 mg/dL — ABNORMAL HIGH (ref 0.57–1.00)
Globulin, Total: 2.7 g/dL (ref 1.5–4.5)
Glucose: 76 mg/dL (ref 70–99)
Potassium: 5.4 mmol/L — ABNORMAL HIGH (ref 3.5–5.2)
Sodium: 137 mmol/L (ref 134–144)
Total Protein: 7.3 g/dL (ref 6.0–8.5)
eGFR: 5 mL/min/{1.73_m2} — ABNORMAL LOW (ref >59)

## 2021-08-09 LAB — T-HELPER CELL (CD4) - (RCID CLINIC ONLY)
CD4 % Helper T Cell: 31 % — ABNORMAL LOW (ref 33–65)
CD4 T Cell Abs: 515 /uL (ref 400–1790)

## 2021-08-09 LAB — T4, FREE: Free T4: 1.03 ng/dL (ref 0.82–1.77)

## 2021-08-09 LAB — FOLLICLE STIMULATING HORMONE: FSH: >200 m[IU]/mL

## 2021-08-09 LAB — TSH: TSH: 2.01 u[IU]/mL (ref 0.450–4.500)

## 2021-08-10 ENCOUNTER — Telehealth: Payer: Self-pay | Admitting: Adult Health

## 2021-08-10 NOTE — Telephone Encounter (Signed)
Left message that [facility] greater than 200 so postmenopausal and thyroid was normal and liver tests normal but kidney function tests elevated, so follow up with kidney doctor on those

## 2021-08-11 DIAGNOSIS — N2581 Secondary hyperparathyroidism of renal origin: Secondary | ICD-10-CM | POA: Diagnosis not present

## 2021-08-11 DIAGNOSIS — N186 End stage renal disease: Secondary | ICD-10-CM | POA: Diagnosis not present

## 2021-08-11 DIAGNOSIS — Z992 Dependence on renal dialysis: Secondary | ICD-10-CM | POA: Diagnosis not present

## 2021-08-11 DIAGNOSIS — D631 Anemia in chronic kidney disease: Secondary | ICD-10-CM | POA: Diagnosis not present

## 2021-08-12 DIAGNOSIS — N186 End stage renal disease: Secondary | ICD-10-CM | POA: Diagnosis not present

## 2021-08-12 DIAGNOSIS — I120 Hypertensive chronic kidney disease with stage 5 chronic kidney disease or end stage renal disease: Secondary | ICD-10-CM | POA: Diagnosis not present

## 2021-08-12 DIAGNOSIS — Z992 Dependence on renal dialysis: Secondary | ICD-10-CM | POA: Diagnosis not present

## 2021-08-14 DIAGNOSIS — N2581 Secondary hyperparathyroidism of renal origin: Secondary | ICD-10-CM | POA: Diagnosis not present

## 2021-08-14 DIAGNOSIS — N186 End stage renal disease: Secondary | ICD-10-CM | POA: Diagnosis not present

## 2021-08-14 DIAGNOSIS — Z992 Dependence on renal dialysis: Secondary | ICD-10-CM | POA: Diagnosis not present

## 2021-08-14 DIAGNOSIS — Z23 Encounter for immunization: Secondary | ICD-10-CM | POA: Diagnosis not present

## 2021-08-14 DIAGNOSIS — D631 Anemia in chronic kidney disease: Secondary | ICD-10-CM | POA: Diagnosis not present

## 2021-08-14 DIAGNOSIS — E875 Hyperkalemia: Secondary | ICD-10-CM | POA: Diagnosis not present

## 2021-08-16 DIAGNOSIS — Z992 Dependence on renal dialysis: Secondary | ICD-10-CM | POA: Diagnosis not present

## 2021-08-16 DIAGNOSIS — N2581 Secondary hyperparathyroidism of renal origin: Secondary | ICD-10-CM | POA: Diagnosis not present

## 2021-08-16 DIAGNOSIS — N186 End stage renal disease: Secondary | ICD-10-CM | POA: Diagnosis not present

## 2021-08-16 DIAGNOSIS — E875 Hyperkalemia: Secondary | ICD-10-CM | POA: Diagnosis not present

## 2021-08-16 DIAGNOSIS — D631 Anemia in chronic kidney disease: Secondary | ICD-10-CM | POA: Diagnosis not present

## 2021-08-18 DIAGNOSIS — N186 End stage renal disease: Secondary | ICD-10-CM | POA: Diagnosis not present

## 2021-08-18 DIAGNOSIS — E875 Hyperkalemia: Secondary | ICD-10-CM | POA: Diagnosis not present

## 2021-08-18 DIAGNOSIS — Z992 Dependence on renal dialysis: Secondary | ICD-10-CM | POA: Diagnosis not present

## 2021-08-18 DIAGNOSIS — D631 Anemia in chronic kidney disease: Secondary | ICD-10-CM | POA: Diagnosis not present

## 2021-08-18 DIAGNOSIS — N2581 Secondary hyperparathyroidism of renal origin: Secondary | ICD-10-CM | POA: Diagnosis not present

## 2021-08-21 ENCOUNTER — Other Ambulatory Visit (HOSPITAL_COMMUNITY): Payer: Self-pay

## 2021-08-21 DIAGNOSIS — N2581 Secondary hyperparathyroidism of renal origin: Secondary | ICD-10-CM | POA: Diagnosis not present

## 2021-08-21 DIAGNOSIS — E875 Hyperkalemia: Secondary | ICD-10-CM | POA: Diagnosis not present

## 2021-08-21 DIAGNOSIS — D631 Anemia in chronic kidney disease: Secondary | ICD-10-CM | POA: Diagnosis not present

## 2021-08-21 DIAGNOSIS — N186 End stage renal disease: Secondary | ICD-10-CM | POA: Diagnosis not present

## 2021-08-21 DIAGNOSIS — Z992 Dependence on renal dialysis: Secondary | ICD-10-CM | POA: Diagnosis not present

## 2021-08-23 ENCOUNTER — Other Ambulatory Visit (HOSPITAL_COMMUNITY): Payer: Self-pay

## 2021-08-23 DIAGNOSIS — N186 End stage renal disease: Secondary | ICD-10-CM | POA: Diagnosis not present

## 2021-08-23 DIAGNOSIS — D631 Anemia in chronic kidney disease: Secondary | ICD-10-CM | POA: Diagnosis not present

## 2021-08-23 DIAGNOSIS — E875 Hyperkalemia: Secondary | ICD-10-CM | POA: Diagnosis not present

## 2021-08-23 DIAGNOSIS — Z992 Dependence on renal dialysis: Secondary | ICD-10-CM | POA: Diagnosis not present

## 2021-08-23 DIAGNOSIS — N2581 Secondary hyperparathyroidism of renal origin: Secondary | ICD-10-CM | POA: Diagnosis not present

## 2021-08-25 ENCOUNTER — Other Ambulatory Visit (HOSPITAL_COMMUNITY): Payer: Self-pay

## 2021-08-25 DIAGNOSIS — E875 Hyperkalemia: Secondary | ICD-10-CM | POA: Diagnosis not present

## 2021-08-25 DIAGNOSIS — D631 Anemia in chronic kidney disease: Secondary | ICD-10-CM | POA: Diagnosis not present

## 2021-08-25 DIAGNOSIS — N2581 Secondary hyperparathyroidism of renal origin: Secondary | ICD-10-CM | POA: Diagnosis not present

## 2021-08-25 DIAGNOSIS — Z992 Dependence on renal dialysis: Secondary | ICD-10-CM | POA: Diagnosis not present

## 2021-08-25 DIAGNOSIS — N186 End stage renal disease: Secondary | ICD-10-CM | POA: Diagnosis not present

## 2021-08-28 DIAGNOSIS — E875 Hyperkalemia: Secondary | ICD-10-CM | POA: Diagnosis not present

## 2021-08-28 DIAGNOSIS — D631 Anemia in chronic kidney disease: Secondary | ICD-10-CM | POA: Diagnosis not present

## 2021-08-28 DIAGNOSIS — N2581 Secondary hyperparathyroidism of renal origin: Secondary | ICD-10-CM | POA: Diagnosis not present

## 2021-08-28 DIAGNOSIS — N186 End stage renal disease: Secondary | ICD-10-CM | POA: Diagnosis not present

## 2021-08-28 DIAGNOSIS — Z992 Dependence on renal dialysis: Secondary | ICD-10-CM | POA: Diagnosis not present

## 2021-08-30 DIAGNOSIS — D631 Anemia in chronic kidney disease: Secondary | ICD-10-CM | POA: Diagnosis not present

## 2021-08-30 DIAGNOSIS — N186 End stage renal disease: Secondary | ICD-10-CM | POA: Diagnosis not present

## 2021-08-30 DIAGNOSIS — E875 Hyperkalemia: Secondary | ICD-10-CM | POA: Diagnosis not present

## 2021-08-30 DIAGNOSIS — Z992 Dependence on renal dialysis: Secondary | ICD-10-CM | POA: Diagnosis not present

## 2021-08-30 DIAGNOSIS — N2581 Secondary hyperparathyroidism of renal origin: Secondary | ICD-10-CM | POA: Diagnosis not present

## 2021-09-01 DIAGNOSIS — E875 Hyperkalemia: Secondary | ICD-10-CM | POA: Diagnosis not present

## 2021-09-01 DIAGNOSIS — D631 Anemia in chronic kidney disease: Secondary | ICD-10-CM | POA: Diagnosis not present

## 2021-09-01 DIAGNOSIS — N2581 Secondary hyperparathyroidism of renal origin: Secondary | ICD-10-CM | POA: Diagnosis not present

## 2021-09-01 DIAGNOSIS — Z992 Dependence on renal dialysis: Secondary | ICD-10-CM | POA: Diagnosis not present

## 2021-09-01 DIAGNOSIS — N186 End stage renal disease: Secondary | ICD-10-CM | POA: Diagnosis not present

## 2021-09-04 DIAGNOSIS — D631 Anemia in chronic kidney disease: Secondary | ICD-10-CM | POA: Diagnosis not present

## 2021-09-04 DIAGNOSIS — Z992 Dependence on renal dialysis: Secondary | ICD-10-CM | POA: Diagnosis not present

## 2021-09-04 DIAGNOSIS — N186 End stage renal disease: Secondary | ICD-10-CM | POA: Diagnosis not present

## 2021-09-04 DIAGNOSIS — E875 Hyperkalemia: Secondary | ICD-10-CM | POA: Diagnosis not present

## 2021-09-04 DIAGNOSIS — N2581 Secondary hyperparathyroidism of renal origin: Secondary | ICD-10-CM | POA: Diagnosis not present

## 2021-09-05 ENCOUNTER — Ambulatory Visit (INDEPENDENT_AMBULATORY_CARE_PROVIDER_SITE_OTHER): Payer: Medicare Other | Admitting: Adult Health

## 2021-09-05 ENCOUNTER — Other Ambulatory Visit: Payer: Self-pay

## 2021-09-05 ENCOUNTER — Encounter: Payer: Self-pay | Admitting: Adult Health

## 2021-09-05 VITALS — BP 126/88 | HR 77 | Ht 61.0 in | Wt 191.0 lb

## 2021-09-05 DIAGNOSIS — R232 Flushing: Secondary | ICD-10-CM | POA: Diagnosis not present

## 2021-09-05 DIAGNOSIS — Z780 Asymptomatic menopausal state: Secondary | ICD-10-CM | POA: Diagnosis not present

## 2021-09-05 DIAGNOSIS — R61 Generalized hyperhidrosis: Secondary | ICD-10-CM

## 2021-09-05 DIAGNOSIS — L299 Pruritus, unspecified: Secondary | ICD-10-CM

## 2021-09-05 MED ORDER — HYDROXYZINE HCL 10 MG PO TABS: 10.0000 mg | ORAL_TABLET | Freq: Three times a day (TID) | ORAL | 12 refills | Status: DC | PRN

## 2021-09-05 NOTE — Progress Notes (Signed)
  Subjective:     Patient ID: Tiffany Golden, female   DOB: 06/10/1976, 45 y.o.   MRN: [id number]  HPI Tiffany Golden is a 45 year old black female,single,PM back in follow up on starting vistaril prn for itching and hot flashes, and itching has stopped and hot flashes better, no night sweats now. PCP is Dr Sallee Lange. Lab Results  Component Value Date   DIAGPAP  03/30/2021    - Negative for intraepithelial lesion or malignancy (NILM)   HPV NOT DETECTED 02/06/2018   [place] Negative 03/30/2021    Review of Systems Itching has resolved Hot flashes better  No night sweats Reviewed past medical,surgical, social and family history. Reviewed medications and allergies.     Objective:   Physical Exam BP 126/88 (BP Location: Right Arm, Patient Position: Sitting, Cuff Size: Normal)   Pulse 77   Ht 5\' 1"  (1.549 m)   Wt 191 lb (86.6 kg)   BMI 36.09 kg/m     Skin warm and dry.  Lungs: clear to ausculation bilaterally. Cardiovascular: regular rate and rhythm.   Upstream - 09/05/21 0908       Pregnancy Intention Screening   Does the patient want to become pregnant in the next year? No    Does the patient's partner want to become pregnant in the next year? No    Would the patient like to discuss contraceptive options today? No      Contraception Wrap Up   Current Method Female Sterilization    End Method Female Sterilization    Contraception Counseling Provided No             Assessment:     1. Hot flashes Better with vistaril, will refill Meds ordered this encounter  Medications   hydrOXYzine (ATARAX/VISTARIL) 10 MG tablet    Sig: Take 1 tablet (10 mg total) by mouth every 8 (eight) hours as needed.    Dispense:  60 tablet    Refill:  12    Order Specific Question:   Supervising Provider    Answer:   Tania Ade H [[id number]]     2. Itching,resolved  3. Night sweats,resolved  4. Postmenopause     Plan:     Return in 7 months for physical   or sooner if needed

## 2021-09-06 DIAGNOSIS — N2581 Secondary hyperparathyroidism of renal origin: Secondary | ICD-10-CM | POA: Diagnosis not present

## 2021-09-06 DIAGNOSIS — E875 Hyperkalemia: Secondary | ICD-10-CM | POA: Diagnosis not present

## 2021-09-06 DIAGNOSIS — D631 Anemia in chronic kidney disease: Secondary | ICD-10-CM | POA: Diagnosis not present

## 2021-09-06 DIAGNOSIS — Z992 Dependence on renal dialysis: Secondary | ICD-10-CM | POA: Diagnosis not present

## 2021-09-06 DIAGNOSIS — N186 End stage renal disease: Secondary | ICD-10-CM | POA: Diagnosis not present

## 2021-09-08 DIAGNOSIS — N2581 Secondary hyperparathyroidism of renal origin: Secondary | ICD-10-CM | POA: Diagnosis not present

## 2021-09-08 DIAGNOSIS — N186 End stage renal disease: Secondary | ICD-10-CM | POA: Diagnosis not present

## 2021-09-08 DIAGNOSIS — E875 Hyperkalemia: Secondary | ICD-10-CM | POA: Diagnosis not present

## 2021-09-08 DIAGNOSIS — D631 Anemia in chronic kidney disease: Secondary | ICD-10-CM | POA: Diagnosis not present

## 2021-09-08 DIAGNOSIS — Z992 Dependence on renal dialysis: Secondary | ICD-10-CM | POA: Diagnosis not present

## 2021-09-11 DIAGNOSIS — E875 Hyperkalemia: Secondary | ICD-10-CM | POA: Diagnosis not present

## 2021-09-11 DIAGNOSIS — D631 Anemia in chronic kidney disease: Secondary | ICD-10-CM | POA: Diagnosis not present

## 2021-09-11 DIAGNOSIS — N2581 Secondary hyperparathyroidism of renal origin: Secondary | ICD-10-CM | POA: Diagnosis not present

## 2021-09-11 DIAGNOSIS — Z992 Dependence on renal dialysis: Secondary | ICD-10-CM | POA: Diagnosis not present

## 2021-09-11 DIAGNOSIS — N186 End stage renal disease: Secondary | ICD-10-CM | POA: Diagnosis not present

## 2021-09-12 DIAGNOSIS — N186 End stage renal disease: Secondary | ICD-10-CM | POA: Diagnosis not present

## 2021-09-12 DIAGNOSIS — Z992 Dependence on renal dialysis: Secondary | ICD-10-CM | POA: Diagnosis not present

## 2021-09-12 DIAGNOSIS — I120 Hypertensive chronic kidney disease with stage 5 chronic kidney disease or end stage renal disease: Secondary | ICD-10-CM | POA: Diagnosis not present

## 2021-09-13 DIAGNOSIS — N186 End stage renal disease: Secondary | ICD-10-CM | POA: Diagnosis not present

## 2021-09-13 DIAGNOSIS — Z992 Dependence on renal dialysis: Secondary | ICD-10-CM | POA: Diagnosis not present

## 2021-09-13 DIAGNOSIS — D631 Anemia in chronic kidney disease: Secondary | ICD-10-CM | POA: Diagnosis not present

## 2021-09-13 DIAGNOSIS — N2581 Secondary hyperparathyroidism of renal origin: Secondary | ICD-10-CM | POA: Diagnosis not present

## 2021-09-15 DIAGNOSIS — N2581 Secondary hyperparathyroidism of renal origin: Secondary | ICD-10-CM | POA: Diagnosis not present

## 2021-09-15 DIAGNOSIS — N186 End stage renal disease: Secondary | ICD-10-CM | POA: Diagnosis not present

## 2021-09-15 DIAGNOSIS — D631 Anemia in chronic kidney disease: Secondary | ICD-10-CM | POA: Diagnosis not present

## 2021-09-15 DIAGNOSIS — Z992 Dependence on renal dialysis: Secondary | ICD-10-CM | POA: Diagnosis not present

## 2021-09-18 DIAGNOSIS — N2581 Secondary hyperparathyroidism of renal origin: Secondary | ICD-10-CM | POA: Diagnosis not present

## 2021-09-18 DIAGNOSIS — D631 Anemia in chronic kidney disease: Secondary | ICD-10-CM | POA: Diagnosis not present

## 2021-09-18 DIAGNOSIS — Z992 Dependence on renal dialysis: Secondary | ICD-10-CM | POA: Diagnosis not present

## 2021-09-18 DIAGNOSIS — N186 End stage renal disease: Secondary | ICD-10-CM | POA: Diagnosis not present

## 2021-09-20 DIAGNOSIS — Z992 Dependence on renal dialysis: Secondary | ICD-10-CM | POA: Diagnosis not present

## 2021-09-20 DIAGNOSIS — N186 End stage renal disease: Secondary | ICD-10-CM | POA: Diagnosis not present

## 2021-09-20 DIAGNOSIS — N2581 Secondary hyperparathyroidism of renal origin: Secondary | ICD-10-CM | POA: Diagnosis not present

## 2021-09-20 DIAGNOSIS — D631 Anemia in chronic kidney disease: Secondary | ICD-10-CM | POA: Diagnosis not present

## 2021-09-21 ENCOUNTER — Other Ambulatory Visit (HOSPITAL_COMMUNITY): Payer: Self-pay

## 2021-09-22 DIAGNOSIS — N186 End stage renal disease: Secondary | ICD-10-CM | POA: Diagnosis not present

## 2021-09-22 DIAGNOSIS — D631 Anemia in chronic kidney disease: Secondary | ICD-10-CM | POA: Diagnosis not present

## 2021-09-22 DIAGNOSIS — N2581 Secondary hyperparathyroidism of renal origin: Secondary | ICD-10-CM | POA: Diagnosis not present

## 2021-09-22 DIAGNOSIS — Z992 Dependence on renal dialysis: Secondary | ICD-10-CM | POA: Diagnosis not present

## 2021-09-25 ENCOUNTER — Other Ambulatory Visit (HOSPITAL_COMMUNITY): Payer: Self-pay

## 2021-09-25 DIAGNOSIS — N186 End stage renal disease: Secondary | ICD-10-CM | POA: Diagnosis not present

## 2021-09-25 DIAGNOSIS — N2581 Secondary hyperparathyroidism of renal origin: Secondary | ICD-10-CM | POA: Diagnosis not present

## 2021-09-25 DIAGNOSIS — Z992 Dependence on renal dialysis: Secondary | ICD-10-CM | POA: Diagnosis not present

## 2021-09-25 DIAGNOSIS — D631 Anemia in chronic kidney disease: Secondary | ICD-10-CM | POA: Diagnosis not present

## 2021-09-27 DIAGNOSIS — D631 Anemia in chronic kidney disease: Secondary | ICD-10-CM | POA: Diagnosis not present

## 2021-09-27 DIAGNOSIS — Z992 Dependence on renal dialysis: Secondary | ICD-10-CM | POA: Diagnosis not present

## 2021-09-27 DIAGNOSIS — N2581 Secondary hyperparathyroidism of renal origin: Secondary | ICD-10-CM | POA: Diagnosis not present

## 2021-09-27 DIAGNOSIS — N186 End stage renal disease: Secondary | ICD-10-CM | POA: Diagnosis not present

## 2021-09-29 DIAGNOSIS — Z992 Dependence on renal dialysis: Secondary | ICD-10-CM | POA: Diagnosis not present

## 2021-09-29 DIAGNOSIS — N2581 Secondary hyperparathyroidism of renal origin: Secondary | ICD-10-CM | POA: Diagnosis not present

## 2021-09-29 DIAGNOSIS — N186 End stage renal disease: Secondary | ICD-10-CM | POA: Diagnosis not present

## 2021-09-29 DIAGNOSIS — D631 Anemia in chronic kidney disease: Secondary | ICD-10-CM | POA: Diagnosis not present

## 2021-10-02 DIAGNOSIS — N186 End stage renal disease: Secondary | ICD-10-CM | POA: Diagnosis not present

## 2021-10-02 DIAGNOSIS — N2581 Secondary hyperparathyroidism of renal origin: Secondary | ICD-10-CM | POA: Diagnosis not present

## 2021-10-02 DIAGNOSIS — Z992 Dependence on renal dialysis: Secondary | ICD-10-CM | POA: Diagnosis not present

## 2021-10-02 DIAGNOSIS — D631 Anemia in chronic kidney disease: Secondary | ICD-10-CM | POA: Diagnosis not present

## 2021-10-04 DIAGNOSIS — N2581 Secondary hyperparathyroidism of renal origin: Secondary | ICD-10-CM | POA: Diagnosis not present

## 2021-10-04 DIAGNOSIS — Z992 Dependence on renal dialysis: Secondary | ICD-10-CM | POA: Diagnosis not present

## 2021-10-04 DIAGNOSIS — D631 Anemia in chronic kidney disease: Secondary | ICD-10-CM | POA: Diagnosis not present

## 2021-10-04 DIAGNOSIS — N186 End stage renal disease: Secondary | ICD-10-CM | POA: Diagnosis not present

## 2021-10-07 DIAGNOSIS — Z992 Dependence on renal dialysis: Secondary | ICD-10-CM | POA: Diagnosis not present

## 2021-10-07 DIAGNOSIS — N186 End stage renal disease: Secondary | ICD-10-CM | POA: Diagnosis not present

## 2021-10-07 DIAGNOSIS — D631 Anemia in chronic kidney disease: Secondary | ICD-10-CM | POA: Diagnosis not present

## 2021-10-07 DIAGNOSIS — N2581 Secondary hyperparathyroidism of renal origin: Secondary | ICD-10-CM | POA: Diagnosis not present

## 2021-10-09 DIAGNOSIS — D631 Anemia in chronic kidney disease: Secondary | ICD-10-CM | POA: Diagnosis not present

## 2021-10-09 DIAGNOSIS — N186 End stage renal disease: Secondary | ICD-10-CM | POA: Diagnosis not present

## 2021-10-09 DIAGNOSIS — N2581 Secondary hyperparathyroidism of renal origin: Secondary | ICD-10-CM | POA: Diagnosis not present

## 2021-10-09 DIAGNOSIS — Z992 Dependence on renal dialysis: Secondary | ICD-10-CM | POA: Diagnosis not present

## 2021-10-11 DIAGNOSIS — N2581 Secondary hyperparathyroidism of renal origin: Secondary | ICD-10-CM | POA: Diagnosis not present

## 2021-10-11 DIAGNOSIS — D631 Anemia in chronic kidney disease: Secondary | ICD-10-CM | POA: Diagnosis not present

## 2021-10-11 DIAGNOSIS — N186 End stage renal disease: Secondary | ICD-10-CM | POA: Diagnosis not present

## 2021-10-11 DIAGNOSIS — Z992 Dependence on renal dialysis: Secondary | ICD-10-CM | POA: Diagnosis not present

## 2021-10-12 DIAGNOSIS — I120 Hypertensive chronic kidney disease with stage 5 chronic kidney disease or end stage renal disease: Secondary | ICD-10-CM | POA: Diagnosis not present

## 2021-10-12 DIAGNOSIS — N186 End stage renal disease: Secondary | ICD-10-CM | POA: Diagnosis not present

## 2021-10-12 DIAGNOSIS — Z992 Dependence on renal dialysis: Secondary | ICD-10-CM | POA: Diagnosis not present

## 2021-10-13 DIAGNOSIS — N2581 Secondary hyperparathyroidism of renal origin: Secondary | ICD-10-CM | POA: Diagnosis not present

## 2021-10-13 DIAGNOSIS — N186 End stage renal disease: Secondary | ICD-10-CM | POA: Diagnosis not present

## 2021-10-13 DIAGNOSIS — D631 Anemia in chronic kidney disease: Secondary | ICD-10-CM | POA: Diagnosis not present

## 2021-10-13 DIAGNOSIS — Z992 Dependence on renal dialysis: Secondary | ICD-10-CM | POA: Diagnosis not present

## 2021-10-16 DIAGNOSIS — Z992 Dependence on renal dialysis: Secondary | ICD-10-CM | POA: Diagnosis not present

## 2021-10-16 DIAGNOSIS — D631 Anemia in chronic kidney disease: Secondary | ICD-10-CM | POA: Diagnosis not present

## 2021-10-16 DIAGNOSIS — N186 End stage renal disease: Secondary | ICD-10-CM | POA: Diagnosis not present

## 2021-10-16 DIAGNOSIS — N2581 Secondary hyperparathyroidism of renal origin: Secondary | ICD-10-CM | POA: Diagnosis not present

## 2021-10-18 ENCOUNTER — Other Ambulatory Visit (HOSPITAL_COMMUNITY): Payer: Self-pay

## 2021-10-18 DIAGNOSIS — D631 Anemia in chronic kidney disease: Secondary | ICD-10-CM | POA: Diagnosis not present

## 2021-10-18 DIAGNOSIS — N2581 Secondary hyperparathyroidism of renal origin: Secondary | ICD-10-CM | POA: Diagnosis not present

## 2021-10-18 DIAGNOSIS — N186 End stage renal disease: Secondary | ICD-10-CM | POA: Diagnosis not present

## 2021-10-18 DIAGNOSIS — Z992 Dependence on renal dialysis: Secondary | ICD-10-CM | POA: Diagnosis not present

## 2021-10-20 ENCOUNTER — Other Ambulatory Visit (HOSPITAL_COMMUNITY): Payer: Self-pay

## 2021-10-20 DIAGNOSIS — N186 End stage renal disease: Secondary | ICD-10-CM | POA: Diagnosis not present

## 2021-10-20 DIAGNOSIS — D631 Anemia in chronic kidney disease: Secondary | ICD-10-CM | POA: Diagnosis not present

## 2021-10-20 DIAGNOSIS — Z992 Dependence on renal dialysis: Secondary | ICD-10-CM | POA: Diagnosis not present

## 2021-10-20 DIAGNOSIS — N2581 Secondary hyperparathyroidism of renal origin: Secondary | ICD-10-CM | POA: Diagnosis not present

## 2021-10-23 DIAGNOSIS — N2581 Secondary hyperparathyroidism of renal origin: Secondary | ICD-10-CM | POA: Diagnosis not present

## 2021-10-23 DIAGNOSIS — Z992 Dependence on renal dialysis: Secondary | ICD-10-CM | POA: Diagnosis not present

## 2021-10-23 DIAGNOSIS — D631 Anemia in chronic kidney disease: Secondary | ICD-10-CM | POA: Diagnosis not present

## 2021-10-23 DIAGNOSIS — N186 End stage renal disease: Secondary | ICD-10-CM | POA: Diagnosis not present

## 2021-10-25 DIAGNOSIS — N186 End stage renal disease: Secondary | ICD-10-CM | POA: Diagnosis not present

## 2021-10-25 DIAGNOSIS — Z992 Dependence on renal dialysis: Secondary | ICD-10-CM | POA: Diagnosis not present

## 2021-10-25 DIAGNOSIS — N2581 Secondary hyperparathyroidism of renal origin: Secondary | ICD-10-CM | POA: Diagnosis not present

## 2021-10-25 DIAGNOSIS — D631 Anemia in chronic kidney disease: Secondary | ICD-10-CM | POA: Diagnosis not present

## 2021-10-27 DIAGNOSIS — N186 End stage renal disease: Secondary | ICD-10-CM | POA: Diagnosis not present

## 2021-10-27 DIAGNOSIS — N2581 Secondary hyperparathyroidism of renal origin: Secondary | ICD-10-CM | POA: Diagnosis not present

## 2021-10-27 DIAGNOSIS — D631 Anemia in chronic kidney disease: Secondary | ICD-10-CM | POA: Diagnosis not present

## 2021-10-27 DIAGNOSIS — Z992 Dependence on renal dialysis: Secondary | ICD-10-CM | POA: Diagnosis not present

## 2021-10-30 DIAGNOSIS — Z992 Dependence on renal dialysis: Secondary | ICD-10-CM | POA: Diagnosis not present

## 2021-10-30 DIAGNOSIS — N2581 Secondary hyperparathyroidism of renal origin: Secondary | ICD-10-CM | POA: Diagnosis not present

## 2021-10-30 DIAGNOSIS — N186 End stage renal disease: Secondary | ICD-10-CM | POA: Diagnosis not present

## 2021-10-30 DIAGNOSIS — D631 Anemia in chronic kidney disease: Secondary | ICD-10-CM | POA: Diagnosis not present

## 2021-11-01 DIAGNOSIS — N186 End stage renal disease: Secondary | ICD-10-CM | POA: Diagnosis not present

## 2021-11-01 DIAGNOSIS — Z992 Dependence on renal dialysis: Secondary | ICD-10-CM | POA: Diagnosis not present

## 2021-11-01 DIAGNOSIS — N2581 Secondary hyperparathyroidism of renal origin: Secondary | ICD-10-CM | POA: Diagnosis not present

## 2021-11-01 DIAGNOSIS — D631 Anemia in chronic kidney disease: Secondary | ICD-10-CM | POA: Diagnosis not present

## 2021-11-03 DIAGNOSIS — Z992 Dependence on renal dialysis: Secondary | ICD-10-CM | POA: Diagnosis not present

## 2021-11-03 DIAGNOSIS — D631 Anemia in chronic kidney disease: Secondary | ICD-10-CM | POA: Diagnosis not present

## 2021-11-03 DIAGNOSIS — N2581 Secondary hyperparathyroidism of renal origin: Secondary | ICD-10-CM | POA: Diagnosis not present

## 2021-11-03 DIAGNOSIS — N186 End stage renal disease: Secondary | ICD-10-CM | POA: Diagnosis not present

## 2021-11-06 DIAGNOSIS — D631 Anemia in chronic kidney disease: Secondary | ICD-10-CM | POA: Diagnosis not present

## 2021-11-06 DIAGNOSIS — N2581 Secondary hyperparathyroidism of renal origin: Secondary | ICD-10-CM | POA: Diagnosis not present

## 2021-11-06 DIAGNOSIS — Z992 Dependence on renal dialysis: Secondary | ICD-10-CM | POA: Diagnosis not present

## 2021-11-06 DIAGNOSIS — N186 End stage renal disease: Secondary | ICD-10-CM | POA: Diagnosis not present

## 2021-11-08 DIAGNOSIS — N186 End stage renal disease: Secondary | ICD-10-CM | POA: Diagnosis not present

## 2021-11-08 DIAGNOSIS — N2581 Secondary hyperparathyroidism of renal origin: Secondary | ICD-10-CM | POA: Diagnosis not present

## 2021-11-08 DIAGNOSIS — Z992 Dependence on renal dialysis: Secondary | ICD-10-CM | POA: Diagnosis not present

## 2021-11-08 DIAGNOSIS — D631 Anemia in chronic kidney disease: Secondary | ICD-10-CM | POA: Diagnosis not present

## 2021-11-10 DIAGNOSIS — N186 End stage renal disease: Secondary | ICD-10-CM | POA: Diagnosis not present

## 2021-11-10 DIAGNOSIS — Z992 Dependence on renal dialysis: Secondary | ICD-10-CM | POA: Diagnosis not present

## 2021-11-10 DIAGNOSIS — N2581 Secondary hyperparathyroidism of renal origin: Secondary | ICD-10-CM | POA: Diagnosis not present

## 2021-11-10 DIAGNOSIS — D631 Anemia in chronic kidney disease: Secondary | ICD-10-CM | POA: Diagnosis not present

## 2021-11-12 DIAGNOSIS — I120 Hypertensive chronic kidney disease with stage 5 chronic kidney disease or end stage renal disease: Secondary | ICD-10-CM | POA: Diagnosis not present

## 2021-11-12 DIAGNOSIS — Z992 Dependence on renal dialysis: Secondary | ICD-10-CM | POA: Diagnosis not present

## 2021-11-12 DIAGNOSIS — N186 End stage renal disease: Secondary | ICD-10-CM | POA: Diagnosis not present

## 2021-11-13 DIAGNOSIS — N186 End stage renal disease: Secondary | ICD-10-CM | POA: Diagnosis not present

## 2021-11-13 DIAGNOSIS — N2581 Secondary hyperparathyroidism of renal origin: Secondary | ICD-10-CM | POA: Diagnosis not present

## 2021-11-13 DIAGNOSIS — D509 Iron deficiency anemia, unspecified: Secondary | ICD-10-CM | POA: Diagnosis not present

## 2021-11-13 DIAGNOSIS — D631 Anemia in chronic kidney disease: Secondary | ICD-10-CM | POA: Diagnosis not present

## 2021-11-13 DIAGNOSIS — Z992 Dependence on renal dialysis: Secondary | ICD-10-CM | POA: Diagnosis not present

## 2021-11-15 DIAGNOSIS — N2581 Secondary hyperparathyroidism of renal origin: Secondary | ICD-10-CM | POA: Diagnosis not present

## 2021-11-15 DIAGNOSIS — D631 Anemia in chronic kidney disease: Secondary | ICD-10-CM | POA: Diagnosis not present

## 2021-11-15 DIAGNOSIS — D509 Iron deficiency anemia, unspecified: Secondary | ICD-10-CM | POA: Diagnosis not present

## 2021-11-15 DIAGNOSIS — Z992 Dependence on renal dialysis: Secondary | ICD-10-CM | POA: Diagnosis not present

## 2021-11-15 DIAGNOSIS — N186 End stage renal disease: Secondary | ICD-10-CM | POA: Diagnosis not present

## 2021-11-16 ENCOUNTER — Other Ambulatory Visit (HOSPITAL_COMMUNITY): Payer: Self-pay

## 2021-11-17 DIAGNOSIS — N186 End stage renal disease: Secondary | ICD-10-CM | POA: Diagnosis not present

## 2021-11-17 DIAGNOSIS — N2581 Secondary hyperparathyroidism of renal origin: Secondary | ICD-10-CM | POA: Diagnosis not present

## 2021-11-17 DIAGNOSIS — Z992 Dependence on renal dialysis: Secondary | ICD-10-CM | POA: Diagnosis not present

## 2021-11-17 DIAGNOSIS — D509 Iron deficiency anemia, unspecified: Secondary | ICD-10-CM | POA: Diagnosis not present

## 2021-11-17 DIAGNOSIS — D631 Anemia in chronic kidney disease: Secondary | ICD-10-CM | POA: Diagnosis not present

## 2021-11-20 DIAGNOSIS — Z992 Dependence on renal dialysis: Secondary | ICD-10-CM | POA: Diagnosis not present

## 2021-11-20 DIAGNOSIS — D509 Iron deficiency anemia, unspecified: Secondary | ICD-10-CM | POA: Diagnosis not present

## 2021-11-20 DIAGNOSIS — N2581 Secondary hyperparathyroidism of renal origin: Secondary | ICD-10-CM | POA: Diagnosis not present

## 2021-11-20 DIAGNOSIS — D631 Anemia in chronic kidney disease: Secondary | ICD-10-CM | POA: Diagnosis not present

## 2021-11-20 DIAGNOSIS — N186 End stage renal disease: Secondary | ICD-10-CM | POA: Diagnosis not present

## 2021-11-21 ENCOUNTER — Other Ambulatory Visit (HOSPITAL_COMMUNITY): Payer: Self-pay

## 2021-11-22 DIAGNOSIS — Z992 Dependence on renal dialysis: Secondary | ICD-10-CM | POA: Diagnosis not present

## 2021-11-22 DIAGNOSIS — N186 End stage renal disease: Secondary | ICD-10-CM | POA: Diagnosis not present

## 2021-11-22 DIAGNOSIS — D509 Iron deficiency anemia, unspecified: Secondary | ICD-10-CM | POA: Diagnosis not present

## 2021-11-22 DIAGNOSIS — N2581 Secondary hyperparathyroidism of renal origin: Secondary | ICD-10-CM | POA: Diagnosis not present

## 2021-11-22 DIAGNOSIS — D631 Anemia in chronic kidney disease: Secondary | ICD-10-CM | POA: Diagnosis not present

## 2021-11-24 DIAGNOSIS — D509 Iron deficiency anemia, unspecified: Secondary | ICD-10-CM | POA: Diagnosis not present

## 2021-11-24 DIAGNOSIS — D631 Anemia in chronic kidney disease: Secondary | ICD-10-CM | POA: Diagnosis not present

## 2021-11-24 DIAGNOSIS — N186 End stage renal disease: Secondary | ICD-10-CM | POA: Diagnosis not present

## 2021-11-24 DIAGNOSIS — N2581 Secondary hyperparathyroidism of renal origin: Secondary | ICD-10-CM | POA: Diagnosis not present

## 2021-11-24 DIAGNOSIS — Z992 Dependence on renal dialysis: Secondary | ICD-10-CM | POA: Diagnosis not present

## 2021-11-27 DIAGNOSIS — D631 Anemia in chronic kidney disease: Secondary | ICD-10-CM | POA: Diagnosis not present

## 2021-11-27 DIAGNOSIS — Z992 Dependence on renal dialysis: Secondary | ICD-10-CM | POA: Diagnosis not present

## 2021-11-27 DIAGNOSIS — N2581 Secondary hyperparathyroidism of renal origin: Secondary | ICD-10-CM | POA: Diagnosis not present

## 2021-11-27 DIAGNOSIS — N186 End stage renal disease: Secondary | ICD-10-CM | POA: Diagnosis not present

## 2021-11-27 DIAGNOSIS — D509 Iron deficiency anemia, unspecified: Secondary | ICD-10-CM | POA: Diagnosis not present

## 2021-11-29 DIAGNOSIS — D509 Iron deficiency anemia, unspecified: Secondary | ICD-10-CM | POA: Diagnosis not present

## 2021-11-29 DIAGNOSIS — Z992 Dependence on renal dialysis: Secondary | ICD-10-CM | POA: Diagnosis not present

## 2021-11-29 DIAGNOSIS — N2581 Secondary hyperparathyroidism of renal origin: Secondary | ICD-10-CM | POA: Diagnosis not present

## 2021-11-29 DIAGNOSIS — D631 Anemia in chronic kidney disease: Secondary | ICD-10-CM | POA: Diagnosis not present

## 2021-11-29 DIAGNOSIS — N186 End stage renal disease: Secondary | ICD-10-CM | POA: Diagnosis not present

## 2021-12-01 DIAGNOSIS — N186 End stage renal disease: Secondary | ICD-10-CM | POA: Diagnosis not present

## 2021-12-01 DIAGNOSIS — D509 Iron deficiency anemia, unspecified: Secondary | ICD-10-CM | POA: Diagnosis not present

## 2021-12-01 DIAGNOSIS — Z992 Dependence on renal dialysis: Secondary | ICD-10-CM | POA: Diagnosis not present

## 2021-12-01 DIAGNOSIS — N2581 Secondary hyperparathyroidism of renal origin: Secondary | ICD-10-CM | POA: Diagnosis not present

## 2021-12-01 DIAGNOSIS — D631 Anemia in chronic kidney disease: Secondary | ICD-10-CM | POA: Diagnosis not present

## 2021-12-04 DIAGNOSIS — N186 End stage renal disease: Secondary | ICD-10-CM | POA: Diagnosis not present

## 2021-12-04 DIAGNOSIS — D509 Iron deficiency anemia, unspecified: Secondary | ICD-10-CM | POA: Diagnosis not present

## 2021-12-04 DIAGNOSIS — Z992 Dependence on renal dialysis: Secondary | ICD-10-CM | POA: Diagnosis not present

## 2021-12-04 DIAGNOSIS — N2581 Secondary hyperparathyroidism of renal origin: Secondary | ICD-10-CM | POA: Diagnosis not present

## 2021-12-04 DIAGNOSIS — D631 Anemia in chronic kidney disease: Secondary | ICD-10-CM | POA: Diagnosis not present

## 2021-12-06 DIAGNOSIS — Z992 Dependence on renal dialysis: Secondary | ICD-10-CM | POA: Diagnosis not present

## 2021-12-06 DIAGNOSIS — D509 Iron deficiency anemia, unspecified: Secondary | ICD-10-CM | POA: Diagnosis not present

## 2021-12-06 DIAGNOSIS — N186 End stage renal disease: Secondary | ICD-10-CM | POA: Diagnosis not present

## 2021-12-06 DIAGNOSIS — D631 Anemia in chronic kidney disease: Secondary | ICD-10-CM | POA: Diagnosis not present

## 2021-12-06 DIAGNOSIS — N2581 Secondary hyperparathyroidism of renal origin: Secondary | ICD-10-CM | POA: Diagnosis not present

## 2021-12-08 DIAGNOSIS — N2581 Secondary hyperparathyroidism of renal origin: Secondary | ICD-10-CM | POA: Diagnosis not present

## 2021-12-08 DIAGNOSIS — D509 Iron deficiency anemia, unspecified: Secondary | ICD-10-CM | POA: Diagnosis not present

## 2021-12-08 DIAGNOSIS — N186 End stage renal disease: Secondary | ICD-10-CM | POA: Diagnosis not present

## 2021-12-08 DIAGNOSIS — D631 Anemia in chronic kidney disease: Secondary | ICD-10-CM | POA: Diagnosis not present

## 2021-12-08 DIAGNOSIS — Z992 Dependence on renal dialysis: Secondary | ICD-10-CM | POA: Diagnosis not present

## 2021-12-11 DIAGNOSIS — N2581 Secondary hyperparathyroidism of renal origin: Secondary | ICD-10-CM | POA: Diagnosis not present

## 2021-12-11 DIAGNOSIS — N186 End stage renal disease: Secondary | ICD-10-CM | POA: Diagnosis not present

## 2021-12-11 DIAGNOSIS — D509 Iron deficiency anemia, unspecified: Secondary | ICD-10-CM | POA: Diagnosis not present

## 2021-12-11 DIAGNOSIS — Z992 Dependence on renal dialysis: Secondary | ICD-10-CM | POA: Diagnosis not present

## 2021-12-11 DIAGNOSIS — D631 Anemia in chronic kidney disease: Secondary | ICD-10-CM | POA: Diagnosis not present

## 2021-12-11 LAB — CBC WITH DIFFERENTIAL/PLATELET
Absolute Monocytes: 302 cells/uL (ref 200–950)
Basophils Absolute: 68 cells/uL (ref 0–200)
Basophils Relative: 1.2 %
Eosinophils Absolute: 120 cells/uL (ref 15–500)
Eosinophils Relative: 2.1 %
HCT: 35.8 % (ref 35.0–45.0)
Hemoglobin: 12.3 g/dL (ref 11.7–15.5)
Lymphs Abs: 1801 cells/uL (ref 850–3900)
MCH: 34.6 pg — ABNORMAL HIGH (ref 27.0–33.0)
MCHC: 34.4 g/dL (ref 32.0–36.0)
MCV: 100.6 fL — ABNORMAL HIGH (ref 80.0–100.0)
MPV: 9.6 fL (ref 7.5–12.5)
Monocytes Relative: 5.3 %
Neutro Abs: 3409 cells/uL (ref 1500–7800)
Neutrophils Relative %: 59.8 %
Platelets: 252 10*3/uL (ref 140–400)
RBC: 3.56 10*6/uL — ABNORMAL LOW (ref 3.80–5.10)
RDW: 12.4 % (ref 11.0–15.0)
Total Lymphocyte: 31.6 %
WBC: 5.7 10*3/uL (ref 3.8–10.8)

## 2021-12-11 LAB — HIV-1 RNA QUANT-NO REFLEX-BLD
HIV 1 RNA Quant: NOT DETECTED Copies/mL
HIV-1 RNA Quant, Log: NOT DETECTED Log cps/mL

## 2021-12-11 LAB — RPR: RPR Ser Ql: NONREACTIVE

## 2021-12-13 DIAGNOSIS — Z992 Dependence on renal dialysis: Secondary | ICD-10-CM | POA: Diagnosis not present

## 2021-12-13 DIAGNOSIS — I120 Hypertensive chronic kidney disease with stage 5 chronic kidney disease or end stage renal disease: Secondary | ICD-10-CM | POA: Diagnosis not present

## 2021-12-13 DIAGNOSIS — N186 End stage renal disease: Secondary | ICD-10-CM | POA: Diagnosis not present

## 2021-12-13 DIAGNOSIS — D509 Iron deficiency anemia, unspecified: Secondary | ICD-10-CM | POA: Diagnosis not present

## 2021-12-13 DIAGNOSIS — N2581 Secondary hyperparathyroidism of renal origin: Secondary | ICD-10-CM | POA: Diagnosis not present

## 2021-12-13 DIAGNOSIS — D631 Anemia in chronic kidney disease: Secondary | ICD-10-CM | POA: Diagnosis not present

## 2021-12-15 DIAGNOSIS — N2581 Secondary hyperparathyroidism of renal origin: Secondary | ICD-10-CM | POA: Diagnosis not present

## 2021-12-15 DIAGNOSIS — Z992 Dependence on renal dialysis: Secondary | ICD-10-CM | POA: Diagnosis not present

## 2021-12-15 DIAGNOSIS — D509 Iron deficiency anemia, unspecified: Secondary | ICD-10-CM | POA: Diagnosis not present

## 2021-12-15 DIAGNOSIS — N186 End stage renal disease: Secondary | ICD-10-CM | POA: Diagnosis not present

## 2021-12-15 DIAGNOSIS — D631 Anemia in chronic kidney disease: Secondary | ICD-10-CM | POA: Diagnosis not present

## 2021-12-18 DIAGNOSIS — Z992 Dependence on renal dialysis: Secondary | ICD-10-CM | POA: Diagnosis not present

## 2021-12-18 DIAGNOSIS — D509 Iron deficiency anemia, unspecified: Secondary | ICD-10-CM | POA: Diagnosis not present

## 2021-12-18 DIAGNOSIS — N2581 Secondary hyperparathyroidism of renal origin: Secondary | ICD-10-CM | POA: Diagnosis not present

## 2021-12-18 DIAGNOSIS — D631 Anemia in chronic kidney disease: Secondary | ICD-10-CM | POA: Diagnosis not present

## 2021-12-18 DIAGNOSIS — N186 End stage renal disease: Secondary | ICD-10-CM | POA: Diagnosis not present

## 2021-12-20 DIAGNOSIS — N2581 Secondary hyperparathyroidism of renal origin: Secondary | ICD-10-CM | POA: Diagnosis not present

## 2021-12-20 DIAGNOSIS — D631 Anemia in chronic kidney disease: Secondary | ICD-10-CM | POA: Diagnosis not present

## 2021-12-20 DIAGNOSIS — N186 End stage renal disease: Secondary | ICD-10-CM | POA: Diagnosis not present

## 2021-12-20 DIAGNOSIS — D509 Iron deficiency anemia, unspecified: Secondary | ICD-10-CM | POA: Diagnosis not present

## 2021-12-20 DIAGNOSIS — Z992 Dependence on renal dialysis: Secondary | ICD-10-CM | POA: Diagnosis not present

## 2021-12-21 ENCOUNTER — Other Ambulatory Visit (HOSPITAL_COMMUNITY): Payer: Self-pay

## 2021-12-22 DIAGNOSIS — Z992 Dependence on renal dialysis: Secondary | ICD-10-CM | POA: Diagnosis not present

## 2021-12-22 DIAGNOSIS — D509 Iron deficiency anemia, unspecified: Secondary | ICD-10-CM | POA: Diagnosis not present

## 2021-12-22 DIAGNOSIS — D631 Anemia in chronic kidney disease: Secondary | ICD-10-CM | POA: Diagnosis not present

## 2021-12-22 DIAGNOSIS — N2581 Secondary hyperparathyroidism of renal origin: Secondary | ICD-10-CM | POA: Diagnosis not present

## 2021-12-22 DIAGNOSIS — N186 End stage renal disease: Secondary | ICD-10-CM | POA: Diagnosis not present

## 2021-12-25 DIAGNOSIS — Z992 Dependence on renal dialysis: Secondary | ICD-10-CM | POA: Diagnosis not present

## 2021-12-25 DIAGNOSIS — D631 Anemia in chronic kidney disease: Secondary | ICD-10-CM | POA: Diagnosis not present

## 2021-12-25 DIAGNOSIS — N186 End stage renal disease: Secondary | ICD-10-CM | POA: Diagnosis not present

## 2021-12-25 DIAGNOSIS — D509 Iron deficiency anemia, unspecified: Secondary | ICD-10-CM | POA: Diagnosis not present

## 2021-12-25 DIAGNOSIS — N2581 Secondary hyperparathyroidism of renal origin: Secondary | ICD-10-CM | POA: Diagnosis not present

## 2021-12-26 ENCOUNTER — Other Ambulatory Visit (HOSPITAL_COMMUNITY): Payer: Self-pay

## 2021-12-27 DIAGNOSIS — N186 End stage renal disease: Secondary | ICD-10-CM | POA: Diagnosis not present

## 2021-12-27 DIAGNOSIS — D631 Anemia in chronic kidney disease: Secondary | ICD-10-CM | POA: Diagnosis not present

## 2021-12-27 DIAGNOSIS — Z992 Dependence on renal dialysis: Secondary | ICD-10-CM | POA: Diagnosis not present

## 2021-12-27 DIAGNOSIS — N2581 Secondary hyperparathyroidism of renal origin: Secondary | ICD-10-CM | POA: Diagnosis not present

## 2021-12-27 DIAGNOSIS — D509 Iron deficiency anemia, unspecified: Secondary | ICD-10-CM | POA: Diagnosis not present

## 2021-12-29 DIAGNOSIS — D631 Anemia in chronic kidney disease: Secondary | ICD-10-CM | POA: Diagnosis not present

## 2021-12-29 DIAGNOSIS — N186 End stage renal disease: Secondary | ICD-10-CM | POA: Diagnosis not present

## 2021-12-29 DIAGNOSIS — N2581 Secondary hyperparathyroidism of renal origin: Secondary | ICD-10-CM | POA: Diagnosis not present

## 2021-12-29 DIAGNOSIS — D509 Iron deficiency anemia, unspecified: Secondary | ICD-10-CM | POA: Diagnosis not present

## 2021-12-29 DIAGNOSIS — Z992 Dependence on renal dialysis: Secondary | ICD-10-CM | POA: Diagnosis not present

## 2022-01-01 DIAGNOSIS — N186 End stage renal disease: Secondary | ICD-10-CM | POA: Diagnosis not present

## 2022-01-01 DIAGNOSIS — D631 Anemia in chronic kidney disease: Secondary | ICD-10-CM | POA: Diagnosis not present

## 2022-01-01 DIAGNOSIS — D509 Iron deficiency anemia, unspecified: Secondary | ICD-10-CM | POA: Diagnosis not present

## 2022-01-01 DIAGNOSIS — Z992 Dependence on renal dialysis: Secondary | ICD-10-CM | POA: Diagnosis not present

## 2022-01-01 DIAGNOSIS — N2581 Secondary hyperparathyroidism of renal origin: Secondary | ICD-10-CM | POA: Diagnosis not present

## 2022-01-03 DIAGNOSIS — D631 Anemia in chronic kidney disease: Secondary | ICD-10-CM | POA: Diagnosis not present

## 2022-01-03 DIAGNOSIS — D509 Iron deficiency anemia, unspecified: Secondary | ICD-10-CM | POA: Diagnosis not present

## 2022-01-03 DIAGNOSIS — N186 End stage renal disease: Secondary | ICD-10-CM | POA: Diagnosis not present

## 2022-01-03 DIAGNOSIS — N2581 Secondary hyperparathyroidism of renal origin: Secondary | ICD-10-CM | POA: Diagnosis not present

## 2022-01-03 DIAGNOSIS — Z992 Dependence on renal dialysis: Secondary | ICD-10-CM | POA: Diagnosis not present

## 2022-01-05 DIAGNOSIS — Z992 Dependence on renal dialysis: Secondary | ICD-10-CM | POA: Diagnosis not present

## 2022-01-05 DIAGNOSIS — D509 Iron deficiency anemia, unspecified: Secondary | ICD-10-CM | POA: Diagnosis not present

## 2022-01-05 DIAGNOSIS — N186 End stage renal disease: Secondary | ICD-10-CM | POA: Diagnosis not present

## 2022-01-05 DIAGNOSIS — D631 Anemia in chronic kidney disease: Secondary | ICD-10-CM | POA: Diagnosis not present

## 2022-01-05 DIAGNOSIS — N2581 Secondary hyperparathyroidism of renal origin: Secondary | ICD-10-CM | POA: Diagnosis not present

## 2022-01-08 DIAGNOSIS — Z992 Dependence on renal dialysis: Secondary | ICD-10-CM | POA: Diagnosis not present

## 2022-01-08 DIAGNOSIS — N186 End stage renal disease: Secondary | ICD-10-CM | POA: Diagnosis not present

## 2022-01-08 DIAGNOSIS — D631 Anemia in chronic kidney disease: Secondary | ICD-10-CM | POA: Diagnosis not present

## 2022-01-08 DIAGNOSIS — D509 Iron deficiency anemia, unspecified: Secondary | ICD-10-CM | POA: Diagnosis not present

## 2022-01-08 DIAGNOSIS — N2581 Secondary hyperparathyroidism of renal origin: Secondary | ICD-10-CM | POA: Diagnosis not present

## 2022-01-10 DIAGNOSIS — N186 End stage renal disease: Secondary | ICD-10-CM | POA: Diagnosis not present

## 2022-01-10 DIAGNOSIS — Z992 Dependence on renal dialysis: Secondary | ICD-10-CM | POA: Diagnosis not present

## 2022-01-10 DIAGNOSIS — I120 Hypertensive chronic kidney disease with stage 5 chronic kidney disease or end stage renal disease: Secondary | ICD-10-CM | POA: Diagnosis not present

## 2022-01-10 DIAGNOSIS — N2581 Secondary hyperparathyroidism of renal origin: Secondary | ICD-10-CM | POA: Diagnosis not present

## 2022-01-11 DIAGNOSIS — Z992 Dependence on renal dialysis: Secondary | ICD-10-CM | POA: Diagnosis not present

## 2022-01-11 DIAGNOSIS — N2581 Secondary hyperparathyroidism of renal origin: Secondary | ICD-10-CM | POA: Diagnosis not present

## 2022-01-11 DIAGNOSIS — N186 End stage renal disease: Secondary | ICD-10-CM | POA: Diagnosis not present

## 2022-01-15 DIAGNOSIS — Z992 Dependence on renal dialysis: Secondary | ICD-10-CM | POA: Diagnosis not present

## 2022-01-15 DIAGNOSIS — N2581 Secondary hyperparathyroidism of renal origin: Secondary | ICD-10-CM | POA: Diagnosis not present

## 2022-01-15 DIAGNOSIS — N186 End stage renal disease: Secondary | ICD-10-CM | POA: Diagnosis not present

## 2022-01-15 NOTE — Progress Notes (Signed)
Subjective:   Chief complaint follow-up for HIV disease on medications   Patient ID: Tiffany Golden, female    DOB: 24-Mar-1976, 46 y.o.   MRN: [id number]  HPI  46 y.o. female past medical history significant for HIV and AIDS that was diagnosed approximately 20 years ago.   She has been on various antiretroviral regimens and has had virological failure with resistance.  She has maintained perfect virological suppression on Biktarvy with 2 active drugs--(she has 180 4V on genotype from March 05, 2017)   She remains on dialysis but is on the transplant list at [facility].  We viewed her past labs including viral loads going back many years.  There is only 1 recent time that she came off meds.  This was in the context of grieving loss of her husband.  Her husband been killed by a drug driver while he was assisting another driver on the side of the road.  This caused her to go to severe depression and she did stop taking her medications.  She has been highly adherent since then and depression is well managed with a counselor and on Zoloft.       Past Medical History:  Diagnosis Date   Anemia    Chronic kidney disease    Complication of anesthesia    Dialysis patient ([place])    mon, wed friday   ESRD (end stage renal disease) ([place])    HIV infection ([place])    Hypertension    PONV (postoperative nausea and vomiting)    Stroke ([place])    TIA (transient ischemic attack)    Hx:of    Past Surgical History:  Procedure Laterality Date   ARTERIOVENOUS GRAFT PLACEMENT  09/11/2011   left arm   CESAREAN SECTION     CESAREAN SECTION WITH BILATERAL TUBAL LIGATION  02/29/1996   DILITATION & CURRETTAGE/HYSTROSCOPY WITH NOVASURE ABLATION N/A 05/03/2015   Procedure: DILATATION/HYSTEROSCOPY WITH NOVASURE ABLATION; uterine cavity length 5.0 cm, uterine cavity width 3.8 cm, power 105 watts; time 1 minute 12 seconds;  Surgeon: Jonnie Kind, MD;  Location: [facility];  Service:  Gynecology;  Laterality: N/A;   FISTULA SUPERFICIALIZATION Left 68/09/5725   Procedure: PLICATION OF LEFT ARM FISTULA;  Surgeon: Cherre Robins, MD;  Location: [place];  Service: Vascular;  Laterality: Left;   INSERTION OF DIALYSIS CATHETER Right 09/01/2013   Procedure: INSERTION OF DIALYSIS CATHETER Right Internal Jugular;  Surgeon: Elam Dutch, MD;  Location: [place];  Service: Vascular;  Laterality: Right;   PATCH ANGIOPLASTY Left 09/01/2013   Procedure: PATCH ANGIOPLASTY;  Surgeon: Elam Dutch, MD;  Location: [place];  Service: Vascular;  Laterality: Left;   REVISON OF ARTERIOVENOUS FISTULA Left 20/35/5974   Procedure: Plication left arm fistula;  Surgeon: Elam Dutch, MD;  Location[facility];  Service: Vascular;  Laterality: Left;    Family History  Problem Relation Age of Onset   Hyperlipidemia Mother    Cancer - Other Mother        Hx partial hysterectomy   Heart disease Father        Hx CABG      Social History   Socioeconomic History   Marital status: Single    Spouse name: Not on file   Number of children: Not on file   Years of education: Not on file   Highest education level: Not on file  Occupational History   Not on file  Tobacco Use   Smoking status:  Former    Packs/day: 0.50    Years: 3.00    Pack years: 1.50    Types: Cigarettes    Quit date: 09/05/2009    Years since quitting: 12.3   Smokeless tobacco: Never  Vaping Use   Vaping Use: Never used  Substance and Sexual Activity   Alcohol use: No   Drug use: No   Sexual activity: Not Currently    Partners: Male    Birth control/protection: Abstinence    Comment: declined condoms  Other Topics Concern   Not on file  Social History Narrative   Not on file   Social Determinants of Health   Financial Resource Strain: Low Risk    Difficulty of Paying Living Expenses: Not hard at all  Food Insecurity: No Food Insecurity   Worried About Charity fundraiser in the Last Year: Never true   Ran  Out of Food in the Last Year: Never true  Transportation Needs: No Transportation Needs   Lack of Transportation (Medical): No   Lack of Transportation (Non-Medical): No  Physical Activity: Insufficiently Active   Days of Exercise per Week: 4 days   Minutes of Exercise per Session: 30 min  Stress: Stress Concern Present   Feeling of Stress : To some extent  Social Connections: Moderately Integrated   Frequency of Communication with Friends and Family: More than three times a week   Frequency of Social Gatherings with Friends and Family: Once a week   Attends Religious Services: 1 to 4 times per year   Active Member of [employer] or Organizations: Yes   Attends Music therapist: More than 4 times per year   Marital Status: Never married    Allergies  Allergen Reactions   Fortaz [Ceftazidime Sodium In Dextrose] Rash    Head-toe   Sulfa Antibiotics Hives   Vancomycin Rash    Head-toe     Current Outpatient Medications:    B Complex-C-Folic Acid (RENA-VITE RX) 1 MG TABS, Take 1 tablet by mouth daily., Disp: , Rfl:    bictegravir-emtricitabine-tenofovir AF (BIKTARVY) 50-200-25 MG TABS tablet, TAKE 1 TABLET BY MOUTH DAILY., Disp: 30 tablet, Rfl: 11   calcium carbonate (TUMS - DOSED IN MG ELEMENTAL CALCIUM) 500 MG chewable tablet, Chew 1-2 tablets by mouth daily as needed for indigestion or heartburn., Disp: , Rfl:    diltiazem (CARDIZEM) 30 MG tablet, Take 1 tablet (30 mg total) by mouth every 6 (six) hours as needed (for elevated heart rates)., Disp: 30 tablet, Rfl: 1   hydrOXYzine (ATARAX/VISTARIL) 10 MG tablet, Take 1 tablet (10 mg total) by mouth every 8 (eight) hours as needed., Disp: 60 tablet, Rfl: 12   metoprolol tartrate (LOPRESSOR) 25 MG tablet, Take 12.5 mg by mouth 2 (two) times daily as needed (for blood pressure). Except takes none on dialysis days (Tuesday, Thursday, and Sunday)., Disp: , Rfl:    sertraline (ZOLOFT) 100 MG tablet, Take 1 tablet (100 mg total) by  mouth daily., Disp: 30 tablet, Rfl: 5    Review of Systems  Constitutional:  Negative for activity change, appetite change, chills, diaphoresis, fatigue, fever and unexpected weight change.  HENT:  Negative for congestion, rhinorrhea, sinus pressure, sneezing, sore throat and trouble swallowing.   Eyes:  Negative for photophobia and visual disturbance.  Respiratory:  Negative for cough, chest tightness, shortness of breath, wheezing and stridor.   Cardiovascular:  Negative for chest pain, palpitations and leg swelling.  Gastrointestinal:  Negative for abdominal distention, abdominal pain,  anal bleeding, blood in stool, constipation, diarrhea, nausea and vomiting.  Genitourinary:  Negative for difficulty urinating, dysuria, flank pain and hematuria.  Musculoskeletal:  Negative for arthralgias, back pain, gait problem, joint swelling and myalgias.  Skin:  Negative for color change, pallor, rash and wound.  Neurological:  Negative for dizziness, tremors, weakness and light-headedness.  Hematological:  Negative for adenopathy. Does not bruise/bleed easily.  Psychiatric/Behavioral:  Negative for agitation, behavioral problems, confusion, decreased concentration, dysphoric mood and sleep disturbance.       Objective:   Physical Exam Vitals reviewed.  Constitutional:      Appearance: Normal appearance. She is obese.  Eyes:     Extraocular Movements: Extraocular movements intact.     Pupils: Pupils are equal, round, and reactive to light.  Cardiovascular:     Rate and Rhythm: Normal rate.  Pulmonary:     Effort: Pulmonary effort is normal. No respiratory distress.     Breath sounds: No wheezing.  Chest:     Chest wall: No tenderness.  Abdominal:     General: There is no distension.  Neurological:     Mental Status: She is alert.          Assessment & Plan:  HIV disease:  I will check a viral load CD4 count CBC.  We can obtain a Gino sure archive today  We will continue  Biktarvy.  If she has renal transplantation we will consider regimen of TIVICAY and Pifeltro JULUCA  Hypertension: Blood pressure well controlled on metoprolol  End-stage renal disease on hemodialysis  Depression and grieving she is doing well now currently on Lexapro and also seeing counselor.  She will return to clinic in 1 years time.

## 2022-01-16 ENCOUNTER — Other Ambulatory Visit (HOSPITAL_COMMUNITY): Payer: Self-pay

## 2022-01-16 ENCOUNTER — Encounter: Payer: Self-pay | Admitting: Infectious Disease

## 2022-01-16 ENCOUNTER — Ambulatory Visit (INDEPENDENT_AMBULATORY_CARE_PROVIDER_SITE_OTHER): Payer: Medicare Other | Admitting: Infectious Disease

## 2022-01-16 ENCOUNTER — Other Ambulatory Visit: Payer: Self-pay

## 2022-01-16 VITALS — BP 146/91 | HR 73 | Temp 97.8°F | Resp 16 | Ht 61.0 in | Wt 191.8 lb

## 2022-01-16 DIAGNOSIS — N186 End stage renal disease: Secondary | ICD-10-CM | POA: Diagnosis not present

## 2022-01-16 DIAGNOSIS — I10 Essential (primary) hypertension: Secondary | ICD-10-CM

## 2022-01-16 DIAGNOSIS — B20 Human immunodeficiency virus [HIV] disease: Secondary | ICD-10-CM

## 2022-01-16 DIAGNOSIS — Z5181 Encounter for therapeutic drug level monitoring: Secondary | ICD-10-CM

## 2022-01-16 DIAGNOSIS — F321 Major depressive disorder, single episode, moderate: Secondary | ICD-10-CM

## 2022-01-16 DIAGNOSIS — Z992 Dependence on renal dialysis: Secondary | ICD-10-CM | POA: Diagnosis not present

## 2022-01-16 MED ORDER — BIKTARVY 50-200-25 MG PO TABS
1.0000 | ORAL_TABLET | Freq: Every day | ORAL | 11 refills | Status: DC
  Filled 2022-01-16: qty 30, 30d supply, fill #0

## 2022-01-16 MED ORDER — BIKTARVY 50-200-25 MG PO TABS
1.0000 | ORAL_TABLET | Freq: Every day | ORAL | 11 refills | Status: DC
  Filled 2022-01-16 – 2022-01-23 (×3): qty 30, 30d supply, fill #0
  Filled 2022-02-14: qty 30, 30d supply, fill #1
  Filled 2022-03-16: qty 30, 30d supply, fill #2
  Filled 2022-04-10: qty 30, 30d supply, fill #3
  Filled 2022-05-07 – 2022-05-18 (×3): qty 30, 30d supply, fill #4
  Filled 2022-06-14: qty 30, 30d supply, fill #5
  Filled 2022-07-17: qty 30, 30d supply, fill #6
  Filled 2022-08-15: qty 30, 30d supply, fill #7
  Filled 2022-09-17: qty 30, 30d supply, fill #8
  Filled 2022-10-18: qty 30, 30d supply, fill #9
  Filled 2022-11-15: qty 30, 30d supply, fill #10
  Filled 2022-12-17: qty 30, 30d supply, fill #11

## 2022-01-17 DIAGNOSIS — N186 End stage renal disease: Secondary | ICD-10-CM | POA: Diagnosis not present

## 2022-01-17 DIAGNOSIS — N2581 Secondary hyperparathyroidism of renal origin: Secondary | ICD-10-CM | POA: Diagnosis not present

## 2022-01-17 DIAGNOSIS — Z992 Dependence on renal dialysis: Secondary | ICD-10-CM | POA: Diagnosis not present

## 2022-01-19 DIAGNOSIS — N2581 Secondary hyperparathyroidism of renal origin: Secondary | ICD-10-CM | POA: Diagnosis not present

## 2022-01-19 DIAGNOSIS — Z992 Dependence on renal dialysis: Secondary | ICD-10-CM | POA: Diagnosis not present

## 2022-01-19 DIAGNOSIS — N186 End stage renal disease: Secondary | ICD-10-CM | POA: Diagnosis not present

## 2022-01-19 LAB — CBC WITH DIFFERENTIAL/PLATELET
Absolute Monocytes: 418 cells/uL (ref 200–950)
Basophils Absolute: 58 cells/uL (ref 0–200)
Basophils Relative: 1 %
Eosinophils Absolute: 157 cells/uL (ref 15–500)
Eosinophils Relative: 2.7 %
HCT: 34.3 % — ABNORMAL LOW (ref 35.0–45.0)
Hemoglobin: 11.8 g/dL (ref 11.7–15.5)
Lymphs Abs: 1798 cells/uL (ref 850–3900)
MCH: 35 pg — ABNORMAL HIGH (ref 27.0–33.0)
MCHC: 34.4 g/dL (ref 32.0–36.0)
MCV: 101.8 fL — ABNORMAL HIGH (ref 80.0–100.0)
MPV: 10 fL (ref 7.5–12.5)
Monocytes Relative: 7.2 %
Neutro Abs: 3370 cells/uL (ref 1500–7800)
Neutrophils Relative %: 58.1 %
Platelets: 241 10*3/uL (ref 140–400)
RBC: 3.37 10*6/uL — ABNORMAL LOW (ref 3.80–5.10)
RDW: 12.7 % (ref 11.0–15.0)
Total Lymphocyte: 31 %
WBC: 5.8 10*3/uL (ref 3.8–10.8)

## 2022-01-19 LAB — T-HELPER CELLS (CD4) COUNT (NOT AT ARMC)
Absolute CD4: 665 cells/uL (ref 490–1740)
CD4 T Helper %: 36 % (ref 30–61)
Total lymphocyte count: 1846 cells/uL (ref 850–3900)

## 2022-01-19 LAB — HIV-1 RNA QUANT-NO REFLEX-BLD
HIV 1 RNA Quant: NOT DETECTED Copies/mL
HIV-1 RNA Quant, Log: NOT DETECTED Log cps/mL

## 2022-01-19 LAB — RPR: RPR Ser Ql: NONREACTIVE

## 2022-01-22 DIAGNOSIS — Z992 Dependence on renal dialysis: Secondary | ICD-10-CM | POA: Diagnosis not present

## 2022-01-22 DIAGNOSIS — N186 End stage renal disease: Secondary | ICD-10-CM | POA: Diagnosis not present

## 2022-01-22 DIAGNOSIS — N2581 Secondary hyperparathyroidism of renal origin: Secondary | ICD-10-CM | POA: Diagnosis not present

## 2022-01-23 ENCOUNTER — Other Ambulatory Visit (HOSPITAL_COMMUNITY): Payer: Self-pay

## 2022-01-24 DIAGNOSIS — Z992 Dependence on renal dialysis: Secondary | ICD-10-CM | POA: Diagnosis not present

## 2022-01-24 DIAGNOSIS — N186 End stage renal disease: Secondary | ICD-10-CM | POA: Diagnosis not present

## 2022-01-24 DIAGNOSIS — N2581 Secondary hyperparathyroidism of renal origin: Secondary | ICD-10-CM | POA: Diagnosis not present

## 2022-01-26 ENCOUNTER — Other Ambulatory Visit (HOSPITAL_COMMUNITY): Payer: Self-pay

## 2022-01-26 DIAGNOSIS — N2581 Secondary hyperparathyroidism of renal origin: Secondary | ICD-10-CM | POA: Diagnosis not present

## 2022-01-26 DIAGNOSIS — Z992 Dependence on renal dialysis: Secondary | ICD-10-CM | POA: Diagnosis not present

## 2022-01-26 DIAGNOSIS — N186 End stage renal disease: Secondary | ICD-10-CM | POA: Diagnosis not present

## 2022-01-29 DIAGNOSIS — N186 End stage renal disease: Secondary | ICD-10-CM | POA: Diagnosis not present

## 2022-01-29 DIAGNOSIS — Z992 Dependence on renal dialysis: Secondary | ICD-10-CM | POA: Diagnosis not present

## 2022-01-29 DIAGNOSIS — N2581 Secondary hyperparathyroidism of renal origin: Secondary | ICD-10-CM | POA: Diagnosis not present

## 2022-01-31 DIAGNOSIS — N186 End stage renal disease: Secondary | ICD-10-CM | POA: Diagnosis not present

## 2022-01-31 DIAGNOSIS — Z992 Dependence on renal dialysis: Secondary | ICD-10-CM | POA: Diagnosis not present

## 2022-01-31 DIAGNOSIS — N2581 Secondary hyperparathyroidism of renal origin: Secondary | ICD-10-CM | POA: Diagnosis not present

## 2022-02-02 DIAGNOSIS — N2581 Secondary hyperparathyroidism of renal origin: Secondary | ICD-10-CM | POA: Diagnosis not present

## 2022-02-02 DIAGNOSIS — N186 End stage renal disease: Secondary | ICD-10-CM | POA: Diagnosis not present

## 2022-02-02 DIAGNOSIS — Z992 Dependence on renal dialysis: Secondary | ICD-10-CM | POA: Diagnosis not present

## 2022-02-05 DIAGNOSIS — N186 End stage renal disease: Secondary | ICD-10-CM | POA: Diagnosis not present

## 2022-02-05 DIAGNOSIS — N2581 Secondary hyperparathyroidism of renal origin: Secondary | ICD-10-CM | POA: Diagnosis not present

## 2022-02-05 DIAGNOSIS — Z992 Dependence on renal dialysis: Secondary | ICD-10-CM | POA: Diagnosis not present

## 2022-02-07 DIAGNOSIS — N2581 Secondary hyperparathyroidism of renal origin: Secondary | ICD-10-CM | POA: Diagnosis not present

## 2022-02-07 DIAGNOSIS — Z992 Dependence on renal dialysis: Secondary | ICD-10-CM | POA: Diagnosis not present

## 2022-02-07 DIAGNOSIS — N186 End stage renal disease: Secondary | ICD-10-CM | POA: Diagnosis not present

## 2022-02-09 DIAGNOSIS — N186 End stage renal disease: Secondary | ICD-10-CM | POA: Diagnosis not present

## 2022-02-09 DIAGNOSIS — Z992 Dependence on renal dialysis: Secondary | ICD-10-CM | POA: Diagnosis not present

## 2022-02-09 DIAGNOSIS — N2581 Secondary hyperparathyroidism of renal origin: Secondary | ICD-10-CM | POA: Diagnosis not present

## 2022-02-10 DIAGNOSIS — N186 End stage renal disease: Secondary | ICD-10-CM | POA: Diagnosis not present

## 2022-02-10 DIAGNOSIS — I120 Hypertensive chronic kidney disease with stage 5 chronic kidney disease or end stage renal disease: Secondary | ICD-10-CM | POA: Diagnosis not present

## 2022-02-10 DIAGNOSIS — Z992 Dependence on renal dialysis: Secondary | ICD-10-CM | POA: Diagnosis not present

## 2022-02-12 DIAGNOSIS — D631 Anemia in chronic kidney disease: Secondary | ICD-10-CM | POA: Diagnosis not present

## 2022-02-12 DIAGNOSIS — N2581 Secondary hyperparathyroidism of renal origin: Secondary | ICD-10-CM | POA: Diagnosis not present

## 2022-02-12 DIAGNOSIS — Z992 Dependence on renal dialysis: Secondary | ICD-10-CM | POA: Diagnosis not present

## 2022-02-12 DIAGNOSIS — N186 End stage renal disease: Secondary | ICD-10-CM | POA: Diagnosis not present

## 2022-02-13 ENCOUNTER — Other Ambulatory Visit (HOSPITAL_COMMUNITY): Payer: Self-pay

## 2022-02-14 ENCOUNTER — Other Ambulatory Visit (HOSPITAL_COMMUNITY): Payer: Self-pay

## 2022-02-14 DIAGNOSIS — N2581 Secondary hyperparathyroidism of renal origin: Secondary | ICD-10-CM | POA: Diagnosis not present

## 2022-02-14 DIAGNOSIS — Z992 Dependence on renal dialysis: Secondary | ICD-10-CM | POA: Diagnosis not present

## 2022-02-14 DIAGNOSIS — N186 End stage renal disease: Secondary | ICD-10-CM | POA: Diagnosis not present

## 2022-02-14 DIAGNOSIS — D631 Anemia in chronic kidney disease: Secondary | ICD-10-CM | POA: Diagnosis not present

## 2022-02-16 DIAGNOSIS — N2581 Secondary hyperparathyroidism of renal origin: Secondary | ICD-10-CM | POA: Diagnosis not present

## 2022-02-16 DIAGNOSIS — N186 End stage renal disease: Secondary | ICD-10-CM | POA: Diagnosis not present

## 2022-02-16 DIAGNOSIS — Z992 Dependence on renal dialysis: Secondary | ICD-10-CM | POA: Diagnosis not present

## 2022-02-16 DIAGNOSIS — D631 Anemia in chronic kidney disease: Secondary | ICD-10-CM | POA: Diagnosis not present

## 2022-02-19 ENCOUNTER — Other Ambulatory Visit (HOSPITAL_COMMUNITY): Payer: Self-pay

## 2022-02-19 DIAGNOSIS — N186 End stage renal disease: Secondary | ICD-10-CM | POA: Diagnosis not present

## 2022-02-19 DIAGNOSIS — Z992 Dependence on renal dialysis: Secondary | ICD-10-CM | POA: Diagnosis not present

## 2022-02-19 DIAGNOSIS — D631 Anemia in chronic kidney disease: Secondary | ICD-10-CM | POA: Diagnosis not present

## 2022-02-19 DIAGNOSIS — N2581 Secondary hyperparathyroidism of renal origin: Secondary | ICD-10-CM | POA: Diagnosis not present

## 2022-02-21 DIAGNOSIS — D631 Anemia in chronic kidney disease: Secondary | ICD-10-CM | POA: Diagnosis not present

## 2022-02-21 DIAGNOSIS — Z992 Dependence on renal dialysis: Secondary | ICD-10-CM | POA: Diagnosis not present

## 2022-02-21 DIAGNOSIS — N186 End stage renal disease: Secondary | ICD-10-CM | POA: Diagnosis not present

## 2022-02-21 DIAGNOSIS — N2581 Secondary hyperparathyroidism of renal origin: Secondary | ICD-10-CM | POA: Diagnosis not present

## 2022-02-23 DIAGNOSIS — Z992 Dependence on renal dialysis: Secondary | ICD-10-CM | POA: Diagnosis not present

## 2022-02-23 DIAGNOSIS — N186 End stage renal disease: Secondary | ICD-10-CM | POA: Diagnosis not present

## 2022-02-23 DIAGNOSIS — N2581 Secondary hyperparathyroidism of renal origin: Secondary | ICD-10-CM | POA: Diagnosis not present

## 2022-02-23 DIAGNOSIS — D631 Anemia in chronic kidney disease: Secondary | ICD-10-CM | POA: Diagnosis not present

## 2022-02-26 DIAGNOSIS — N2581 Secondary hyperparathyroidism of renal origin: Secondary | ICD-10-CM | POA: Diagnosis not present

## 2022-02-26 DIAGNOSIS — D631 Anemia in chronic kidney disease: Secondary | ICD-10-CM | POA: Diagnosis not present

## 2022-02-26 DIAGNOSIS — N186 End stage renal disease: Secondary | ICD-10-CM | POA: Diagnosis not present

## 2022-02-26 DIAGNOSIS — Z992 Dependence on renal dialysis: Secondary | ICD-10-CM | POA: Diagnosis not present

## 2022-02-28 DIAGNOSIS — N186 End stage renal disease: Secondary | ICD-10-CM | POA: Diagnosis not present

## 2022-02-28 DIAGNOSIS — Z992 Dependence on renal dialysis: Secondary | ICD-10-CM | POA: Diagnosis not present

## 2022-02-28 DIAGNOSIS — N2581 Secondary hyperparathyroidism of renal origin: Secondary | ICD-10-CM | POA: Diagnosis not present

## 2022-02-28 DIAGNOSIS — D631 Anemia in chronic kidney disease: Secondary | ICD-10-CM | POA: Diagnosis not present

## 2022-03-02 DIAGNOSIS — N186 End stage renal disease: Secondary | ICD-10-CM | POA: Diagnosis not present

## 2022-03-02 DIAGNOSIS — Z992 Dependence on renal dialysis: Secondary | ICD-10-CM | POA: Diagnosis not present

## 2022-03-02 DIAGNOSIS — N2581 Secondary hyperparathyroidism of renal origin: Secondary | ICD-10-CM | POA: Diagnosis not present

## 2022-03-02 DIAGNOSIS — D631 Anemia in chronic kidney disease: Secondary | ICD-10-CM | POA: Diagnosis not present

## 2022-03-05 DIAGNOSIS — Z992 Dependence on renal dialysis: Secondary | ICD-10-CM | POA: Diagnosis not present

## 2022-03-05 DIAGNOSIS — N186 End stage renal disease: Secondary | ICD-10-CM | POA: Diagnosis not present

## 2022-03-05 DIAGNOSIS — N2581 Secondary hyperparathyroidism of renal origin: Secondary | ICD-10-CM | POA: Diagnosis not present

## 2022-03-05 DIAGNOSIS — D631 Anemia in chronic kidney disease: Secondary | ICD-10-CM | POA: Diagnosis not present

## 2022-03-07 DIAGNOSIS — N186 End stage renal disease: Secondary | ICD-10-CM | POA: Diagnosis not present

## 2022-03-07 DIAGNOSIS — Z992 Dependence on renal dialysis: Secondary | ICD-10-CM | POA: Diagnosis not present

## 2022-03-07 DIAGNOSIS — N2581 Secondary hyperparathyroidism of renal origin: Secondary | ICD-10-CM | POA: Diagnosis not present

## 2022-03-07 DIAGNOSIS — D631 Anemia in chronic kidney disease: Secondary | ICD-10-CM | POA: Diagnosis not present

## 2022-03-09 DIAGNOSIS — N2581 Secondary hyperparathyroidism of renal origin: Secondary | ICD-10-CM | POA: Diagnosis not present

## 2022-03-09 DIAGNOSIS — D631 Anemia in chronic kidney disease: Secondary | ICD-10-CM | POA: Diagnosis not present

## 2022-03-09 DIAGNOSIS — N186 End stage renal disease: Secondary | ICD-10-CM | POA: Diagnosis not present

## 2022-03-09 DIAGNOSIS — Z992 Dependence on renal dialysis: Secondary | ICD-10-CM | POA: Diagnosis not present

## 2022-03-12 DIAGNOSIS — N2581 Secondary hyperparathyroidism of renal origin: Secondary | ICD-10-CM | POA: Diagnosis not present

## 2022-03-12 DIAGNOSIS — Z992 Dependence on renal dialysis: Secondary | ICD-10-CM | POA: Diagnosis not present

## 2022-03-12 DIAGNOSIS — D631 Anemia in chronic kidney disease: Secondary | ICD-10-CM | POA: Diagnosis not present

## 2022-03-12 DIAGNOSIS — I120 Hypertensive chronic kidney disease with stage 5 chronic kidney disease or end stage renal disease: Secondary | ICD-10-CM | POA: Diagnosis not present

## 2022-03-12 DIAGNOSIS — N186 End stage renal disease: Secondary | ICD-10-CM | POA: Diagnosis not present

## 2022-03-13 ENCOUNTER — Other Ambulatory Visit (HOSPITAL_COMMUNITY): Payer: Self-pay

## 2022-03-14 DIAGNOSIS — Z992 Dependence on renal dialysis: Secondary | ICD-10-CM | POA: Diagnosis not present

## 2022-03-14 DIAGNOSIS — D631 Anemia in chronic kidney disease: Secondary | ICD-10-CM | POA: Diagnosis not present

## 2022-03-14 DIAGNOSIS — N2581 Secondary hyperparathyroidism of renal origin: Secondary | ICD-10-CM | POA: Diagnosis not present

## 2022-03-14 DIAGNOSIS — N186 End stage renal disease: Secondary | ICD-10-CM | POA: Diagnosis not present

## 2022-03-15 ENCOUNTER — Other Ambulatory Visit (HOSPITAL_COMMUNITY): Payer: Self-pay

## 2022-03-16 ENCOUNTER — Other Ambulatory Visit (HOSPITAL_COMMUNITY): Payer: Self-pay

## 2022-03-16 DIAGNOSIS — Z992 Dependence on renal dialysis: Secondary | ICD-10-CM | POA: Diagnosis not present

## 2022-03-16 DIAGNOSIS — N2581 Secondary hyperparathyroidism of renal origin: Secondary | ICD-10-CM | POA: Diagnosis not present

## 2022-03-16 DIAGNOSIS — D631 Anemia in chronic kidney disease: Secondary | ICD-10-CM | POA: Diagnosis not present

## 2022-03-16 DIAGNOSIS — N186 End stage renal disease: Secondary | ICD-10-CM | POA: Diagnosis not present

## 2022-03-19 ENCOUNTER — Other Ambulatory Visit (HOSPITAL_COMMUNITY): Payer: Self-pay

## 2022-03-19 DIAGNOSIS — D631 Anemia in chronic kidney disease: Secondary | ICD-10-CM | POA: Diagnosis not present

## 2022-03-19 DIAGNOSIS — Z992 Dependence on renal dialysis: Secondary | ICD-10-CM | POA: Diagnosis not present

## 2022-03-19 DIAGNOSIS — N2581 Secondary hyperparathyroidism of renal origin: Secondary | ICD-10-CM | POA: Diagnosis not present

## 2022-03-19 DIAGNOSIS — N186 End stage renal disease: Secondary | ICD-10-CM | POA: Diagnosis not present

## 2022-03-21 ENCOUNTER — Telehealth: Payer: Self-pay | Admitting: Infectious Disease

## 2022-03-21 DIAGNOSIS — Z992 Dependence on renal dialysis: Secondary | ICD-10-CM | POA: Diagnosis not present

## 2022-03-21 DIAGNOSIS — D631 Anemia in chronic kidney disease: Secondary | ICD-10-CM | POA: Diagnosis not present

## 2022-03-21 DIAGNOSIS — N2581 Secondary hyperparathyroidism of renal origin: Secondary | ICD-10-CM | POA: Diagnosis not present

## 2022-03-21 DIAGNOSIS — N186 End stage renal disease: Secondary | ICD-10-CM | POA: Diagnosis not present

## 2022-03-21 NOTE — Telephone Encounter (Signed)
Genosure Archive: ? ? ? ? ? ?

## 2022-03-23 DIAGNOSIS — D631 Anemia in chronic kidney disease: Secondary | ICD-10-CM | POA: Diagnosis not present

## 2022-03-23 DIAGNOSIS — N186 End stage renal disease: Secondary | ICD-10-CM | POA: Diagnosis not present

## 2022-03-23 DIAGNOSIS — N2581 Secondary hyperparathyroidism of renal origin: Secondary | ICD-10-CM | POA: Diagnosis not present

## 2022-03-23 DIAGNOSIS — Z992 Dependence on renal dialysis: Secondary | ICD-10-CM | POA: Diagnosis not present

## 2022-03-26 DIAGNOSIS — Z992 Dependence on renal dialysis: Secondary | ICD-10-CM | POA: Diagnosis not present

## 2022-03-26 DIAGNOSIS — D631 Anemia in chronic kidney disease: Secondary | ICD-10-CM | POA: Diagnosis not present

## 2022-03-26 DIAGNOSIS — N186 End stage renal disease: Secondary | ICD-10-CM | POA: Diagnosis not present

## 2022-03-26 DIAGNOSIS — N2581 Secondary hyperparathyroidism of renal origin: Secondary | ICD-10-CM | POA: Diagnosis not present

## 2022-03-28 DIAGNOSIS — D631 Anemia in chronic kidney disease: Secondary | ICD-10-CM | POA: Diagnosis not present

## 2022-03-28 DIAGNOSIS — Z992 Dependence on renal dialysis: Secondary | ICD-10-CM | POA: Diagnosis not present

## 2022-03-28 DIAGNOSIS — N186 End stage renal disease: Secondary | ICD-10-CM | POA: Diagnosis not present

## 2022-03-28 DIAGNOSIS — N2581 Secondary hyperparathyroidism of renal origin: Secondary | ICD-10-CM | POA: Diagnosis not present

## 2022-03-30 DIAGNOSIS — N2581 Secondary hyperparathyroidism of renal origin: Secondary | ICD-10-CM | POA: Diagnosis not present

## 2022-03-30 DIAGNOSIS — Z992 Dependence on renal dialysis: Secondary | ICD-10-CM | POA: Diagnosis not present

## 2022-03-30 DIAGNOSIS — D631 Anemia in chronic kidney disease: Secondary | ICD-10-CM | POA: Diagnosis not present

## 2022-03-30 DIAGNOSIS — N186 End stage renal disease: Secondary | ICD-10-CM | POA: Diagnosis not present

## 2022-04-02 DIAGNOSIS — N2581 Secondary hyperparathyroidism of renal origin: Secondary | ICD-10-CM | POA: Diagnosis not present

## 2022-04-02 DIAGNOSIS — Z992 Dependence on renal dialysis: Secondary | ICD-10-CM | POA: Diagnosis not present

## 2022-04-02 DIAGNOSIS — D631 Anemia in chronic kidney disease: Secondary | ICD-10-CM | POA: Diagnosis not present

## 2022-04-02 DIAGNOSIS — N186 End stage renal disease: Secondary | ICD-10-CM | POA: Diagnosis not present

## 2022-04-04 DIAGNOSIS — N186 End stage renal disease: Secondary | ICD-10-CM | POA: Diagnosis not present

## 2022-04-04 DIAGNOSIS — D631 Anemia in chronic kidney disease: Secondary | ICD-10-CM | POA: Diagnosis not present

## 2022-04-04 DIAGNOSIS — Z992 Dependence on renal dialysis: Secondary | ICD-10-CM | POA: Diagnosis not present

## 2022-04-04 DIAGNOSIS — N2581 Secondary hyperparathyroidism of renal origin: Secondary | ICD-10-CM | POA: Diagnosis not present

## 2022-04-05 ENCOUNTER — Telehealth: Payer: Self-pay | Admitting: Infectious Disease

## 2022-04-05 NOTE — Telephone Encounter (Signed)
[facility] Genosure Archive:

## 2022-04-06 ENCOUNTER — Other Ambulatory Visit (HOSPITAL_COMMUNITY): Payer: Self-pay

## 2022-04-06 DIAGNOSIS — D631 Anemia in chronic kidney disease: Secondary | ICD-10-CM | POA: Diagnosis not present

## 2022-04-06 DIAGNOSIS — N2581 Secondary hyperparathyroidism of renal origin: Secondary | ICD-10-CM | POA: Diagnosis not present

## 2022-04-06 DIAGNOSIS — Z992 Dependence on renal dialysis: Secondary | ICD-10-CM | POA: Diagnosis not present

## 2022-04-06 DIAGNOSIS — N186 End stage renal disease: Secondary | ICD-10-CM | POA: Diagnosis not present

## 2022-04-09 DIAGNOSIS — N2581 Secondary hyperparathyroidism of renal origin: Secondary | ICD-10-CM | POA: Diagnosis not present

## 2022-04-09 DIAGNOSIS — Z992 Dependence on renal dialysis: Secondary | ICD-10-CM | POA: Diagnosis not present

## 2022-04-09 DIAGNOSIS — D631 Anemia in chronic kidney disease: Secondary | ICD-10-CM | POA: Diagnosis not present

## 2022-04-09 DIAGNOSIS — N186 End stage renal disease: Secondary | ICD-10-CM | POA: Diagnosis not present

## 2022-04-10 ENCOUNTER — Other Ambulatory Visit (HOSPITAL_COMMUNITY): Payer: Self-pay

## 2022-04-11 DIAGNOSIS — D631 Anemia in chronic kidney disease: Secondary | ICD-10-CM | POA: Diagnosis not present

## 2022-04-11 DIAGNOSIS — N186 End stage renal disease: Secondary | ICD-10-CM | POA: Diagnosis not present

## 2022-04-11 DIAGNOSIS — N2581 Secondary hyperparathyroidism of renal origin: Secondary | ICD-10-CM | POA: Diagnosis not present

## 2022-04-11 DIAGNOSIS — Z992 Dependence on renal dialysis: Secondary | ICD-10-CM | POA: Diagnosis not present

## 2022-04-12 ENCOUNTER — Other Ambulatory Visit (HOSPITAL_COMMUNITY): Payer: Self-pay

## 2022-04-12 DIAGNOSIS — I120 Hypertensive chronic kidney disease with stage 5 chronic kidney disease or end stage renal disease: Secondary | ICD-10-CM | POA: Diagnosis not present

## 2022-04-12 DIAGNOSIS — Z992 Dependence on renal dialysis: Secondary | ICD-10-CM | POA: Diagnosis not present

## 2022-04-12 DIAGNOSIS — N186 End stage renal disease: Secondary | ICD-10-CM | POA: Diagnosis not present

## 2022-04-13 DIAGNOSIS — Z992 Dependence on renal dialysis: Secondary | ICD-10-CM | POA: Diagnosis not present

## 2022-04-13 DIAGNOSIS — N186 End stage renal disease: Secondary | ICD-10-CM | POA: Diagnosis not present

## 2022-04-13 DIAGNOSIS — D631 Anemia in chronic kidney disease: Secondary | ICD-10-CM | POA: Diagnosis not present

## 2022-04-13 DIAGNOSIS — N2581 Secondary hyperparathyroidism of renal origin: Secondary | ICD-10-CM | POA: Diagnosis not present

## 2022-04-16 DIAGNOSIS — Z992 Dependence on renal dialysis: Secondary | ICD-10-CM | POA: Diagnosis not present

## 2022-04-16 DIAGNOSIS — D631 Anemia in chronic kidney disease: Secondary | ICD-10-CM | POA: Diagnosis not present

## 2022-04-16 DIAGNOSIS — N2581 Secondary hyperparathyroidism of renal origin: Secondary | ICD-10-CM | POA: Diagnosis not present

## 2022-04-16 DIAGNOSIS — N186 End stage renal disease: Secondary | ICD-10-CM | POA: Diagnosis not present

## 2022-04-17 ENCOUNTER — Other Ambulatory Visit (HOSPITAL_COMMUNITY): Payer: Self-pay | Admitting: Adult Health

## 2022-04-17 DIAGNOSIS — Z1231 Encounter for screening mammogram for malignant neoplasm of breast: Secondary | ICD-10-CM

## 2022-04-18 DIAGNOSIS — D631 Anemia in chronic kidney disease: Secondary | ICD-10-CM | POA: Diagnosis not present

## 2022-04-18 DIAGNOSIS — Z992 Dependence on renal dialysis: Secondary | ICD-10-CM | POA: Diagnosis not present

## 2022-04-18 DIAGNOSIS — N186 End stage renal disease: Secondary | ICD-10-CM | POA: Diagnosis not present

## 2022-04-18 DIAGNOSIS — N2581 Secondary hyperparathyroidism of renal origin: Secondary | ICD-10-CM | POA: Diagnosis not present

## 2022-04-20 DIAGNOSIS — D631 Anemia in chronic kidney disease: Secondary | ICD-10-CM | POA: Diagnosis not present

## 2022-04-20 DIAGNOSIS — Z992 Dependence on renal dialysis: Secondary | ICD-10-CM | POA: Diagnosis not present

## 2022-04-20 DIAGNOSIS — N186 End stage renal disease: Secondary | ICD-10-CM | POA: Diagnosis not present

## 2022-04-20 DIAGNOSIS — N2581 Secondary hyperparathyroidism of renal origin: Secondary | ICD-10-CM | POA: Diagnosis not present

## 2022-04-23 DIAGNOSIS — D631 Anemia in chronic kidney disease: Secondary | ICD-10-CM | POA: Diagnosis not present

## 2022-04-23 DIAGNOSIS — N2581 Secondary hyperparathyroidism of renal origin: Secondary | ICD-10-CM | POA: Diagnosis not present

## 2022-04-23 DIAGNOSIS — N186 End stage renal disease: Secondary | ICD-10-CM | POA: Diagnosis not present

## 2022-04-23 DIAGNOSIS — Z992 Dependence on renal dialysis: Secondary | ICD-10-CM | POA: Diagnosis not present

## 2022-04-25 DIAGNOSIS — N186 End stage renal disease: Secondary | ICD-10-CM | POA: Diagnosis not present

## 2022-04-25 DIAGNOSIS — D631 Anemia in chronic kidney disease: Secondary | ICD-10-CM | POA: Diagnosis not present

## 2022-04-25 DIAGNOSIS — Z992 Dependence on renal dialysis: Secondary | ICD-10-CM | POA: Diagnosis not present

## 2022-04-25 DIAGNOSIS — N2581 Secondary hyperparathyroidism of renal origin: Secondary | ICD-10-CM | POA: Diagnosis not present

## 2022-04-27 DIAGNOSIS — N2581 Secondary hyperparathyroidism of renal origin: Secondary | ICD-10-CM | POA: Diagnosis not present

## 2022-04-27 DIAGNOSIS — N186 End stage renal disease: Secondary | ICD-10-CM | POA: Diagnosis not present

## 2022-04-27 DIAGNOSIS — Z992 Dependence on renal dialysis: Secondary | ICD-10-CM | POA: Diagnosis not present

## 2022-04-27 DIAGNOSIS — D631 Anemia in chronic kidney disease: Secondary | ICD-10-CM | POA: Diagnosis not present

## 2022-04-30 DIAGNOSIS — N186 End stage renal disease: Secondary | ICD-10-CM | POA: Diagnosis not present

## 2022-04-30 DIAGNOSIS — D631 Anemia in chronic kidney disease: Secondary | ICD-10-CM | POA: Diagnosis not present

## 2022-04-30 DIAGNOSIS — Z992 Dependence on renal dialysis: Secondary | ICD-10-CM | POA: Diagnosis not present

## 2022-04-30 DIAGNOSIS — N2581 Secondary hyperparathyroidism of renal origin: Secondary | ICD-10-CM | POA: Diagnosis not present

## 2022-05-02 DIAGNOSIS — N186 End stage renal disease: Secondary | ICD-10-CM | POA: Diagnosis not present

## 2022-05-02 DIAGNOSIS — N2581 Secondary hyperparathyroidism of renal origin: Secondary | ICD-10-CM | POA: Diagnosis not present

## 2022-05-02 DIAGNOSIS — Z992 Dependence on renal dialysis: Secondary | ICD-10-CM | POA: Diagnosis not present

## 2022-05-02 DIAGNOSIS — D631 Anemia in chronic kidney disease: Secondary | ICD-10-CM | POA: Diagnosis not present

## 2022-05-03 ENCOUNTER — Other Ambulatory Visit (HOSPITAL_COMMUNITY): Payer: Self-pay

## 2022-05-04 DIAGNOSIS — N2581 Secondary hyperparathyroidism of renal origin: Secondary | ICD-10-CM | POA: Diagnosis not present

## 2022-05-04 DIAGNOSIS — D631 Anemia in chronic kidney disease: Secondary | ICD-10-CM | POA: Diagnosis not present

## 2022-05-04 DIAGNOSIS — N186 End stage renal disease: Secondary | ICD-10-CM | POA: Diagnosis not present

## 2022-05-04 DIAGNOSIS — Z992 Dependence on renal dialysis: Secondary | ICD-10-CM | POA: Diagnosis not present

## 2022-05-07 ENCOUNTER — Other Ambulatory Visit (HOSPITAL_COMMUNITY): Payer: Self-pay

## 2022-05-07 DIAGNOSIS — N186 End stage renal disease: Secondary | ICD-10-CM | POA: Diagnosis not present

## 2022-05-07 DIAGNOSIS — Z992 Dependence on renal dialysis: Secondary | ICD-10-CM | POA: Diagnosis not present

## 2022-05-07 DIAGNOSIS — N2581 Secondary hyperparathyroidism of renal origin: Secondary | ICD-10-CM | POA: Diagnosis not present

## 2022-05-07 DIAGNOSIS — D631 Anemia in chronic kidney disease: Secondary | ICD-10-CM | POA: Diagnosis not present

## 2022-05-09 DIAGNOSIS — Z992 Dependence on renal dialysis: Secondary | ICD-10-CM | POA: Diagnosis not present

## 2022-05-09 DIAGNOSIS — N186 End stage renal disease: Secondary | ICD-10-CM | POA: Diagnosis not present

## 2022-05-09 DIAGNOSIS — D631 Anemia in chronic kidney disease: Secondary | ICD-10-CM | POA: Diagnosis not present

## 2022-05-09 DIAGNOSIS — N2581 Secondary hyperparathyroidism of renal origin: Secondary | ICD-10-CM | POA: Diagnosis not present

## 2022-05-11 DIAGNOSIS — Z992 Dependence on renal dialysis: Secondary | ICD-10-CM | POA: Diagnosis not present

## 2022-05-11 DIAGNOSIS — D631 Anemia in chronic kidney disease: Secondary | ICD-10-CM | POA: Diagnosis not present

## 2022-05-11 DIAGNOSIS — N2581 Secondary hyperparathyroidism of renal origin: Secondary | ICD-10-CM | POA: Diagnosis not present

## 2022-05-11 DIAGNOSIS — N186 End stage renal disease: Secondary | ICD-10-CM | POA: Diagnosis not present

## 2022-05-12 DIAGNOSIS — I120 Hypertensive chronic kidney disease with stage 5 chronic kidney disease or end stage renal disease: Secondary | ICD-10-CM | POA: Diagnosis not present

## 2022-05-12 DIAGNOSIS — Z992 Dependence on renal dialysis: Secondary | ICD-10-CM | POA: Diagnosis not present

## 2022-05-12 DIAGNOSIS — N186 End stage renal disease: Secondary | ICD-10-CM | POA: Diagnosis not present

## 2022-05-14 DIAGNOSIS — N2581 Secondary hyperparathyroidism of renal origin: Secondary | ICD-10-CM | POA: Diagnosis not present

## 2022-05-14 DIAGNOSIS — N186 End stage renal disease: Secondary | ICD-10-CM | POA: Diagnosis not present

## 2022-05-14 DIAGNOSIS — Z992 Dependence on renal dialysis: Secondary | ICD-10-CM | POA: Diagnosis not present

## 2022-05-14 DIAGNOSIS — D631 Anemia in chronic kidney disease: Secondary | ICD-10-CM | POA: Diagnosis not present

## 2022-05-16 ENCOUNTER — Other Ambulatory Visit (HOSPITAL_COMMUNITY): Payer: Self-pay

## 2022-05-16 DIAGNOSIS — N2581 Secondary hyperparathyroidism of renal origin: Secondary | ICD-10-CM | POA: Diagnosis not present

## 2022-05-16 DIAGNOSIS — N186 End stage renal disease: Secondary | ICD-10-CM | POA: Diagnosis not present

## 2022-05-16 DIAGNOSIS — Z992 Dependence on renal dialysis: Secondary | ICD-10-CM | POA: Diagnosis not present

## 2022-05-16 DIAGNOSIS — D631 Anemia in chronic kidney disease: Secondary | ICD-10-CM | POA: Diagnosis not present

## 2022-05-18 ENCOUNTER — Other Ambulatory Visit (HOSPITAL_COMMUNITY): Payer: Self-pay

## 2022-05-18 ENCOUNTER — Encounter (HOSPITAL_COMMUNITY): Payer: Self-pay | Admitting: *Deleted

## 2022-05-18 ENCOUNTER — Telehealth: Payer: Self-pay

## 2022-05-18 DIAGNOSIS — Z992 Dependence on renal dialysis: Secondary | ICD-10-CM | POA: Diagnosis not present

## 2022-05-18 DIAGNOSIS — N186 End stage renal disease: Secondary | ICD-10-CM | POA: Diagnosis not present

## 2022-05-18 DIAGNOSIS — N2581 Secondary hyperparathyroidism of renal origin: Secondary | ICD-10-CM | POA: Diagnosis not present

## 2022-05-18 DIAGNOSIS — D631 Anemia in chronic kidney disease: Secondary | ICD-10-CM | POA: Diagnosis not present

## 2022-05-18 NOTE — Telephone Encounter (Signed)
[facility] Patient Advocate Encounter   I was successful in securing patient a $7500.00 grant from [employer] (PAF) to provide copayment coverage for Biktarvy.  This will make the out of pocket cost $0.00.     I have spoken with the patient.    The billing information is as follows and has been shared with [facility].        Patient knows to call the office with questions or concerns.  Cristin Szatkowski, CPhT Specialty Pharmacy Patient [facility] Phone: [phone] Fax:  [phone]  

## 2022-05-21 ENCOUNTER — Other Ambulatory Visit (HOSPITAL_COMMUNITY): Payer: Self-pay

## 2022-05-21 DIAGNOSIS — N2581 Secondary hyperparathyroidism of renal origin: Secondary | ICD-10-CM | POA: Diagnosis not present

## 2022-05-21 DIAGNOSIS — D631 Anemia in chronic kidney disease: Secondary | ICD-10-CM | POA: Diagnosis not present

## 2022-05-21 DIAGNOSIS — Z992 Dependence on renal dialysis: Secondary | ICD-10-CM | POA: Diagnosis not present

## 2022-05-21 DIAGNOSIS — N186 End stage renal disease: Secondary | ICD-10-CM | POA: Diagnosis not present

## 2022-05-23 DIAGNOSIS — Z992 Dependence on renal dialysis: Secondary | ICD-10-CM | POA: Diagnosis not present

## 2022-05-23 DIAGNOSIS — D631 Anemia in chronic kidney disease: Secondary | ICD-10-CM | POA: Diagnosis not present

## 2022-05-23 DIAGNOSIS — N2581 Secondary hyperparathyroidism of renal origin: Secondary | ICD-10-CM | POA: Diagnosis not present

## 2022-05-23 DIAGNOSIS — N186 End stage renal disease: Secondary | ICD-10-CM | POA: Diagnosis not present

## 2022-05-24 ENCOUNTER — Ambulatory Visit (HOSPITAL_COMMUNITY)
Admission: RE | Admit: 2022-05-24 | Discharge: 2022-05-24 | Disposition: A | Discharge status: Home / Self Care | Payer: Medicare Other | Source: Ambulatory Visit | Attending: Adult Health | Admitting: Adult Health

## 2022-05-24 DIAGNOSIS — Z1231 Encounter for screening mammogram for malignant neoplasm of breast: Secondary | ICD-10-CM | POA: Diagnosis not present

## 2022-05-25 DIAGNOSIS — Z992 Dependence on renal dialysis: Secondary | ICD-10-CM | POA: Diagnosis not present

## 2022-05-25 DIAGNOSIS — N186 End stage renal disease: Secondary | ICD-10-CM | POA: Diagnosis not present

## 2022-05-25 DIAGNOSIS — D631 Anemia in chronic kidney disease: Secondary | ICD-10-CM | POA: Diagnosis not present

## 2022-05-25 DIAGNOSIS — N2581 Secondary hyperparathyroidism of renal origin: Secondary | ICD-10-CM | POA: Diagnosis not present

## 2022-05-28 DIAGNOSIS — Z992 Dependence on renal dialysis: Secondary | ICD-10-CM | POA: Diagnosis not present

## 2022-05-28 DIAGNOSIS — N186 End stage renal disease: Secondary | ICD-10-CM | POA: Diagnosis not present

## 2022-05-28 DIAGNOSIS — N2581 Secondary hyperparathyroidism of renal origin: Secondary | ICD-10-CM | POA: Diagnosis not present

## 2022-05-28 DIAGNOSIS — D631 Anemia in chronic kidney disease: Secondary | ICD-10-CM | POA: Diagnosis not present

## 2022-05-30 DIAGNOSIS — D631 Anemia in chronic kidney disease: Secondary | ICD-10-CM | POA: Diagnosis not present

## 2022-05-30 DIAGNOSIS — N2581 Secondary hyperparathyroidism of renal origin: Secondary | ICD-10-CM | POA: Diagnosis not present

## 2022-05-30 DIAGNOSIS — Z992 Dependence on renal dialysis: Secondary | ICD-10-CM | POA: Diagnosis not present

## 2022-05-30 DIAGNOSIS — N186 End stage renal disease: Secondary | ICD-10-CM | POA: Diagnosis not present

## 2022-06-01 DIAGNOSIS — N2581 Secondary hyperparathyroidism of renal origin: Secondary | ICD-10-CM | POA: Diagnosis not present

## 2022-06-01 DIAGNOSIS — Z992 Dependence on renal dialysis: Secondary | ICD-10-CM | POA: Diagnosis not present

## 2022-06-01 DIAGNOSIS — N186 End stage renal disease: Secondary | ICD-10-CM | POA: Diagnosis not present

## 2022-06-01 DIAGNOSIS — D631 Anemia in chronic kidney disease: Secondary | ICD-10-CM | POA: Diagnosis not present

## 2022-06-04 DIAGNOSIS — D631 Anemia in chronic kidney disease: Secondary | ICD-10-CM | POA: Diagnosis not present

## 2022-06-04 DIAGNOSIS — N2581 Secondary hyperparathyroidism of renal origin: Secondary | ICD-10-CM | POA: Diagnosis not present

## 2022-06-04 DIAGNOSIS — Z992 Dependence on renal dialysis: Secondary | ICD-10-CM | POA: Diagnosis not present

## 2022-06-04 DIAGNOSIS — N186 End stage renal disease: Secondary | ICD-10-CM | POA: Diagnosis not present

## 2022-06-06 DIAGNOSIS — N2581 Secondary hyperparathyroidism of renal origin: Secondary | ICD-10-CM | POA: Diagnosis not present

## 2022-06-06 DIAGNOSIS — D631 Anemia in chronic kidney disease: Secondary | ICD-10-CM | POA: Diagnosis not present

## 2022-06-06 DIAGNOSIS — N186 End stage renal disease: Secondary | ICD-10-CM | POA: Diagnosis not present

## 2022-06-06 DIAGNOSIS — Z992 Dependence on renal dialysis: Secondary | ICD-10-CM | POA: Diagnosis not present

## 2022-06-08 DIAGNOSIS — D631 Anemia in chronic kidney disease: Secondary | ICD-10-CM | POA: Diagnosis not present

## 2022-06-08 DIAGNOSIS — Z992 Dependence on renal dialysis: Secondary | ICD-10-CM | POA: Diagnosis not present

## 2022-06-08 DIAGNOSIS — N186 End stage renal disease: Secondary | ICD-10-CM | POA: Diagnosis not present

## 2022-06-08 DIAGNOSIS — N2581 Secondary hyperparathyroidism of renal origin: Secondary | ICD-10-CM | POA: Diagnosis not present

## 2022-06-11 DIAGNOSIS — N2581 Secondary hyperparathyroidism of renal origin: Secondary | ICD-10-CM | POA: Diagnosis not present

## 2022-06-11 DIAGNOSIS — N186 End stage renal disease: Secondary | ICD-10-CM | POA: Diagnosis not present

## 2022-06-11 DIAGNOSIS — D631 Anemia in chronic kidney disease: Secondary | ICD-10-CM | POA: Diagnosis not present

## 2022-06-11 DIAGNOSIS — Z992 Dependence on renal dialysis: Secondary | ICD-10-CM | POA: Diagnosis not present

## 2022-06-12 DIAGNOSIS — N186 End stage renal disease: Secondary | ICD-10-CM | POA: Diagnosis not present

## 2022-06-12 DIAGNOSIS — Z992 Dependence on renal dialysis: Secondary | ICD-10-CM | POA: Diagnosis not present

## 2022-06-12 DIAGNOSIS — I120 Hypertensive chronic kidney disease with stage 5 chronic kidney disease or end stage renal disease: Secondary | ICD-10-CM | POA: Diagnosis not present

## 2022-06-13 DIAGNOSIS — D631 Anemia in chronic kidney disease: Secondary | ICD-10-CM | POA: Diagnosis not present

## 2022-06-13 DIAGNOSIS — N186 End stage renal disease: Secondary | ICD-10-CM | POA: Diagnosis not present

## 2022-06-13 DIAGNOSIS — N2581 Secondary hyperparathyroidism of renal origin: Secondary | ICD-10-CM | POA: Diagnosis not present

## 2022-06-13 DIAGNOSIS — Z992 Dependence on renal dialysis: Secondary | ICD-10-CM | POA: Diagnosis not present

## 2022-06-14 ENCOUNTER — Other Ambulatory Visit (HOSPITAL_COMMUNITY): Payer: Self-pay

## 2022-06-15 DIAGNOSIS — N2581 Secondary hyperparathyroidism of renal origin: Secondary | ICD-10-CM | POA: Diagnosis not present

## 2022-06-15 DIAGNOSIS — Z992 Dependence on renal dialysis: Secondary | ICD-10-CM | POA: Diagnosis not present

## 2022-06-15 DIAGNOSIS — N186 End stage renal disease: Secondary | ICD-10-CM | POA: Diagnosis not present

## 2022-06-15 DIAGNOSIS — D631 Anemia in chronic kidney disease: Secondary | ICD-10-CM | POA: Diagnosis not present

## 2022-06-18 ENCOUNTER — Other Ambulatory Visit (HOSPITAL_COMMUNITY): Payer: Self-pay

## 2022-06-18 DIAGNOSIS — Z992 Dependence on renal dialysis: Secondary | ICD-10-CM | POA: Diagnosis not present

## 2022-06-18 DIAGNOSIS — N2581 Secondary hyperparathyroidism of renal origin: Secondary | ICD-10-CM | POA: Diagnosis not present

## 2022-06-18 DIAGNOSIS — N186 End stage renal disease: Secondary | ICD-10-CM | POA: Diagnosis not present

## 2022-06-18 DIAGNOSIS — D631 Anemia in chronic kidney disease: Secondary | ICD-10-CM | POA: Diagnosis not present

## 2022-06-20 DIAGNOSIS — D631 Anemia in chronic kidney disease: Secondary | ICD-10-CM | POA: Diagnosis not present

## 2022-06-20 DIAGNOSIS — N186 End stage renal disease: Secondary | ICD-10-CM | POA: Diagnosis not present

## 2022-06-20 DIAGNOSIS — N2581 Secondary hyperparathyroidism of renal origin: Secondary | ICD-10-CM | POA: Diagnosis not present

## 2022-06-20 DIAGNOSIS — Z992 Dependence on renal dialysis: Secondary | ICD-10-CM | POA: Diagnosis not present

## 2022-06-22 DIAGNOSIS — N186 End stage renal disease: Secondary | ICD-10-CM | POA: Diagnosis not present

## 2022-06-22 DIAGNOSIS — N2581 Secondary hyperparathyroidism of renal origin: Secondary | ICD-10-CM | POA: Diagnosis not present

## 2022-06-22 DIAGNOSIS — D631 Anemia in chronic kidney disease: Secondary | ICD-10-CM | POA: Diagnosis not present

## 2022-06-22 DIAGNOSIS — Z992 Dependence on renal dialysis: Secondary | ICD-10-CM | POA: Diagnosis not present

## 2022-06-25 DIAGNOSIS — N186 End stage renal disease: Secondary | ICD-10-CM | POA: Diagnosis not present

## 2022-06-25 DIAGNOSIS — N2581 Secondary hyperparathyroidism of renal origin: Secondary | ICD-10-CM | POA: Diagnosis not present

## 2022-06-25 DIAGNOSIS — Z992 Dependence on renal dialysis: Secondary | ICD-10-CM | POA: Diagnosis not present

## 2022-06-25 DIAGNOSIS — D631 Anemia in chronic kidney disease: Secondary | ICD-10-CM | POA: Diagnosis not present

## 2022-06-27 DIAGNOSIS — N2581 Secondary hyperparathyroidism of renal origin: Secondary | ICD-10-CM | POA: Diagnosis not present

## 2022-06-27 DIAGNOSIS — N186 End stage renal disease: Secondary | ICD-10-CM | POA: Diagnosis not present

## 2022-06-27 DIAGNOSIS — D631 Anemia in chronic kidney disease: Secondary | ICD-10-CM | POA: Diagnosis not present

## 2022-06-27 DIAGNOSIS — Z992 Dependence on renal dialysis: Secondary | ICD-10-CM | POA: Diagnosis not present

## 2022-06-29 DIAGNOSIS — D631 Anemia in chronic kidney disease: Secondary | ICD-10-CM | POA: Diagnosis not present

## 2022-06-29 DIAGNOSIS — Z992 Dependence on renal dialysis: Secondary | ICD-10-CM | POA: Diagnosis not present

## 2022-06-29 DIAGNOSIS — N186 End stage renal disease: Secondary | ICD-10-CM | POA: Diagnosis not present

## 2022-06-29 DIAGNOSIS — N2581 Secondary hyperparathyroidism of renal origin: Secondary | ICD-10-CM | POA: Diagnosis not present

## 2022-07-02 DIAGNOSIS — D631 Anemia in chronic kidney disease: Secondary | ICD-10-CM | POA: Diagnosis not present

## 2022-07-02 DIAGNOSIS — N2581 Secondary hyperparathyroidism of renal origin: Secondary | ICD-10-CM | POA: Diagnosis not present

## 2022-07-02 DIAGNOSIS — Z992 Dependence on renal dialysis: Secondary | ICD-10-CM | POA: Diagnosis not present

## 2022-07-02 DIAGNOSIS — N186 End stage renal disease: Secondary | ICD-10-CM | POA: Diagnosis not present

## 2022-07-04 DIAGNOSIS — D631 Anemia in chronic kidney disease: Secondary | ICD-10-CM | POA: Diagnosis not present

## 2022-07-04 DIAGNOSIS — N186 End stage renal disease: Secondary | ICD-10-CM | POA: Diagnosis not present

## 2022-07-04 DIAGNOSIS — N2581 Secondary hyperparathyroidism of renal origin: Secondary | ICD-10-CM | POA: Diagnosis not present

## 2022-07-04 DIAGNOSIS — Z992 Dependence on renal dialysis: Secondary | ICD-10-CM | POA: Diagnosis not present

## 2022-07-06 DIAGNOSIS — N2581 Secondary hyperparathyroidism of renal origin: Secondary | ICD-10-CM | POA: Diagnosis not present

## 2022-07-06 DIAGNOSIS — D631 Anemia in chronic kidney disease: Secondary | ICD-10-CM | POA: Diagnosis not present

## 2022-07-06 DIAGNOSIS — N186 End stage renal disease: Secondary | ICD-10-CM | POA: Diagnosis not present

## 2022-07-06 DIAGNOSIS — Z992 Dependence on renal dialysis: Secondary | ICD-10-CM | POA: Diagnosis not present

## 2022-07-09 DIAGNOSIS — D631 Anemia in chronic kidney disease: Secondary | ICD-10-CM | POA: Diagnosis not present

## 2022-07-09 DIAGNOSIS — N186 End stage renal disease: Secondary | ICD-10-CM | POA: Diagnosis not present

## 2022-07-09 DIAGNOSIS — Z992 Dependence on renal dialysis: Secondary | ICD-10-CM | POA: Diagnosis not present

## 2022-07-09 DIAGNOSIS — N2581 Secondary hyperparathyroidism of renal origin: Secondary | ICD-10-CM | POA: Diagnosis not present

## 2022-07-11 ENCOUNTER — Other Ambulatory Visit (HOSPITAL_COMMUNITY): Payer: Self-pay

## 2022-07-11 DIAGNOSIS — Z992 Dependence on renal dialysis: Secondary | ICD-10-CM | POA: Diagnosis not present

## 2022-07-11 DIAGNOSIS — N2581 Secondary hyperparathyroidism of renal origin: Secondary | ICD-10-CM | POA: Diagnosis not present

## 2022-07-11 DIAGNOSIS — D631 Anemia in chronic kidney disease: Secondary | ICD-10-CM | POA: Diagnosis not present

## 2022-07-11 DIAGNOSIS — N186 End stage renal disease: Secondary | ICD-10-CM | POA: Diagnosis not present

## 2022-07-13 DIAGNOSIS — N186 End stage renal disease: Secondary | ICD-10-CM | POA: Diagnosis not present

## 2022-07-13 DIAGNOSIS — N2581 Secondary hyperparathyroidism of renal origin: Secondary | ICD-10-CM | POA: Diagnosis not present

## 2022-07-13 DIAGNOSIS — Z992 Dependence on renal dialysis: Secondary | ICD-10-CM | POA: Diagnosis not present

## 2022-07-13 DIAGNOSIS — I120 Hypertensive chronic kidney disease with stage 5 chronic kidney disease or end stage renal disease: Secondary | ICD-10-CM | POA: Diagnosis not present

## 2022-07-13 DIAGNOSIS — D631 Anemia in chronic kidney disease: Secondary | ICD-10-CM | POA: Diagnosis not present

## 2022-07-16 DIAGNOSIS — Z992 Dependence on renal dialysis: Secondary | ICD-10-CM | POA: Diagnosis not present

## 2022-07-16 DIAGNOSIS — D631 Anemia in chronic kidney disease: Secondary | ICD-10-CM | POA: Diagnosis not present

## 2022-07-16 DIAGNOSIS — N186 End stage renal disease: Secondary | ICD-10-CM | POA: Diagnosis not present

## 2022-07-16 DIAGNOSIS — N2581 Secondary hyperparathyroidism of renal origin: Secondary | ICD-10-CM | POA: Diagnosis not present

## 2022-07-17 ENCOUNTER — Other Ambulatory Visit (HOSPITAL_COMMUNITY): Payer: Self-pay

## 2022-07-18 DIAGNOSIS — D631 Anemia in chronic kidney disease: Secondary | ICD-10-CM | POA: Diagnosis not present

## 2022-07-18 DIAGNOSIS — N186 End stage renal disease: Secondary | ICD-10-CM | POA: Diagnosis not present

## 2022-07-18 DIAGNOSIS — N2581 Secondary hyperparathyroidism of renal origin: Secondary | ICD-10-CM | POA: Diagnosis not present

## 2022-07-18 DIAGNOSIS — Z992 Dependence on renal dialysis: Secondary | ICD-10-CM | POA: Diagnosis not present

## 2022-07-20 DIAGNOSIS — N186 End stage renal disease: Secondary | ICD-10-CM | POA: Diagnosis not present

## 2022-07-20 DIAGNOSIS — N2581 Secondary hyperparathyroidism of renal origin: Secondary | ICD-10-CM | POA: Diagnosis not present

## 2022-07-20 DIAGNOSIS — Z992 Dependence on renal dialysis: Secondary | ICD-10-CM | POA: Diagnosis not present

## 2022-07-20 DIAGNOSIS — D631 Anemia in chronic kidney disease: Secondary | ICD-10-CM | POA: Diagnosis not present

## 2022-07-23 ENCOUNTER — Other Ambulatory Visit (HOSPITAL_COMMUNITY): Payer: Self-pay

## 2022-07-23 DIAGNOSIS — Z992 Dependence on renal dialysis: Secondary | ICD-10-CM | POA: Diagnosis not present

## 2022-07-23 DIAGNOSIS — N2581 Secondary hyperparathyroidism of renal origin: Secondary | ICD-10-CM | POA: Diagnosis not present

## 2022-07-23 DIAGNOSIS — N186 End stage renal disease: Secondary | ICD-10-CM | POA: Diagnosis not present

## 2022-07-23 DIAGNOSIS — D631 Anemia in chronic kidney disease: Secondary | ICD-10-CM | POA: Diagnosis not present

## 2022-07-25 DIAGNOSIS — N186 End stage renal disease: Secondary | ICD-10-CM | POA: Diagnosis not present

## 2022-07-25 DIAGNOSIS — Z992 Dependence on renal dialysis: Secondary | ICD-10-CM | POA: Diagnosis not present

## 2022-07-25 DIAGNOSIS — N2581 Secondary hyperparathyroidism of renal origin: Secondary | ICD-10-CM | POA: Diagnosis not present

## 2022-07-25 DIAGNOSIS — D631 Anemia in chronic kidney disease: Secondary | ICD-10-CM | POA: Diagnosis not present

## 2022-07-27 DIAGNOSIS — N186 End stage renal disease: Secondary | ICD-10-CM | POA: Diagnosis not present

## 2022-07-27 DIAGNOSIS — N2581 Secondary hyperparathyroidism of renal origin: Secondary | ICD-10-CM | POA: Diagnosis not present

## 2022-07-27 DIAGNOSIS — D631 Anemia in chronic kidney disease: Secondary | ICD-10-CM | POA: Diagnosis not present

## 2022-07-27 DIAGNOSIS — Z992 Dependence on renal dialysis: Secondary | ICD-10-CM | POA: Diagnosis not present

## 2022-07-30 DIAGNOSIS — Z992 Dependence on renal dialysis: Secondary | ICD-10-CM | POA: Diagnosis not present

## 2022-07-30 DIAGNOSIS — D631 Anemia in chronic kidney disease: Secondary | ICD-10-CM | POA: Diagnosis not present

## 2022-07-30 DIAGNOSIS — N186 End stage renal disease: Secondary | ICD-10-CM | POA: Diagnosis not present

## 2022-07-30 DIAGNOSIS — N2581 Secondary hyperparathyroidism of renal origin: Secondary | ICD-10-CM | POA: Diagnosis not present

## 2022-08-01 DIAGNOSIS — D631 Anemia in chronic kidney disease: Secondary | ICD-10-CM | POA: Diagnosis not present

## 2022-08-01 DIAGNOSIS — Z992 Dependence on renal dialysis: Secondary | ICD-10-CM | POA: Diagnosis not present

## 2022-08-01 DIAGNOSIS — N186 End stage renal disease: Secondary | ICD-10-CM | POA: Diagnosis not present

## 2022-08-01 DIAGNOSIS — N2581 Secondary hyperparathyroidism of renal origin: Secondary | ICD-10-CM | POA: Diagnosis not present

## 2022-08-03 DIAGNOSIS — N186 End stage renal disease: Secondary | ICD-10-CM | POA: Diagnosis not present

## 2022-08-03 DIAGNOSIS — N2581 Secondary hyperparathyroidism of renal origin: Secondary | ICD-10-CM | POA: Diagnosis not present

## 2022-08-03 DIAGNOSIS — Z992 Dependence on renal dialysis: Secondary | ICD-10-CM | POA: Diagnosis not present

## 2022-08-03 DIAGNOSIS — D631 Anemia in chronic kidney disease: Secondary | ICD-10-CM | POA: Diagnosis not present

## 2022-08-06 DIAGNOSIS — N186 End stage renal disease: Secondary | ICD-10-CM | POA: Diagnosis not present

## 2022-08-06 DIAGNOSIS — Z992 Dependence on renal dialysis: Secondary | ICD-10-CM | POA: Diagnosis not present

## 2022-08-06 DIAGNOSIS — D631 Anemia in chronic kidney disease: Secondary | ICD-10-CM | POA: Diagnosis not present

## 2022-08-06 DIAGNOSIS — N2581 Secondary hyperparathyroidism of renal origin: Secondary | ICD-10-CM | POA: Diagnosis not present

## 2022-08-08 DIAGNOSIS — N2581 Secondary hyperparathyroidism of renal origin: Secondary | ICD-10-CM | POA: Diagnosis not present

## 2022-08-08 DIAGNOSIS — D631 Anemia in chronic kidney disease: Secondary | ICD-10-CM | POA: Diagnosis not present

## 2022-08-08 DIAGNOSIS — N186 End stage renal disease: Secondary | ICD-10-CM | POA: Diagnosis not present

## 2022-08-08 DIAGNOSIS — Z992 Dependence on renal dialysis: Secondary | ICD-10-CM | POA: Diagnosis not present

## 2022-08-10 DIAGNOSIS — D631 Anemia in chronic kidney disease: Secondary | ICD-10-CM | POA: Diagnosis not present

## 2022-08-10 DIAGNOSIS — Z992 Dependence on renal dialysis: Secondary | ICD-10-CM | POA: Diagnosis not present

## 2022-08-10 DIAGNOSIS — N186 End stage renal disease: Secondary | ICD-10-CM | POA: Diagnosis not present

## 2022-08-10 DIAGNOSIS — N2581 Secondary hyperparathyroidism of renal origin: Secondary | ICD-10-CM | POA: Diagnosis not present

## 2022-08-12 DIAGNOSIS — I120 Hypertensive chronic kidney disease with stage 5 chronic kidney disease or end stage renal disease: Secondary | ICD-10-CM | POA: Diagnosis not present

## 2022-08-12 DIAGNOSIS — Z992 Dependence on renal dialysis: Secondary | ICD-10-CM | POA: Diagnosis not present

## 2022-08-12 DIAGNOSIS — N186 End stage renal disease: Secondary | ICD-10-CM | POA: Diagnosis not present

## 2022-08-13 DIAGNOSIS — D631 Anemia in chronic kidney disease: Secondary | ICD-10-CM | POA: Diagnosis not present

## 2022-08-13 DIAGNOSIS — Z23 Encounter for immunization: Secondary | ICD-10-CM | POA: Diagnosis not present

## 2022-08-13 DIAGNOSIS — N2581 Secondary hyperparathyroidism of renal origin: Secondary | ICD-10-CM | POA: Diagnosis not present

## 2022-08-13 DIAGNOSIS — Z992 Dependence on renal dialysis: Secondary | ICD-10-CM | POA: Diagnosis not present

## 2022-08-13 DIAGNOSIS — N186 End stage renal disease: Secondary | ICD-10-CM | POA: Diagnosis not present

## 2022-08-15 ENCOUNTER — Other Ambulatory Visit (HOSPITAL_COMMUNITY): Payer: Self-pay

## 2022-08-15 DIAGNOSIS — Z23 Encounter for immunization: Secondary | ICD-10-CM | POA: Diagnosis not present

## 2022-08-15 DIAGNOSIS — D631 Anemia in chronic kidney disease: Secondary | ICD-10-CM | POA: Diagnosis not present

## 2022-08-15 DIAGNOSIS — N186 End stage renal disease: Secondary | ICD-10-CM | POA: Diagnosis not present

## 2022-08-15 DIAGNOSIS — N2581 Secondary hyperparathyroidism of renal origin: Secondary | ICD-10-CM | POA: Diagnosis not present

## 2022-08-15 DIAGNOSIS — Z992 Dependence on renal dialysis: Secondary | ICD-10-CM | POA: Diagnosis not present

## 2022-08-17 DIAGNOSIS — Z23 Encounter for immunization: Secondary | ICD-10-CM | POA: Diagnosis not present

## 2022-08-17 DIAGNOSIS — N186 End stage renal disease: Secondary | ICD-10-CM | POA: Diagnosis not present

## 2022-08-17 DIAGNOSIS — Z992 Dependence on renal dialysis: Secondary | ICD-10-CM | POA: Diagnosis not present

## 2022-08-17 DIAGNOSIS — N2581 Secondary hyperparathyroidism of renal origin: Secondary | ICD-10-CM | POA: Diagnosis not present

## 2022-08-17 DIAGNOSIS — D631 Anemia in chronic kidney disease: Secondary | ICD-10-CM | POA: Diagnosis not present

## 2022-08-20 ENCOUNTER — Other Ambulatory Visit (HOSPITAL_COMMUNITY): Payer: Self-pay

## 2022-08-20 DIAGNOSIS — N2581 Secondary hyperparathyroidism of renal origin: Secondary | ICD-10-CM | POA: Diagnosis not present

## 2022-08-20 DIAGNOSIS — Z992 Dependence on renal dialysis: Secondary | ICD-10-CM | POA: Diagnosis not present

## 2022-08-20 DIAGNOSIS — N186 End stage renal disease: Secondary | ICD-10-CM | POA: Diagnosis not present

## 2022-08-20 DIAGNOSIS — D631 Anemia in chronic kidney disease: Secondary | ICD-10-CM | POA: Diagnosis not present

## 2022-08-20 DIAGNOSIS — Z23 Encounter for immunization: Secondary | ICD-10-CM | POA: Diagnosis not present

## 2022-08-22 DIAGNOSIS — Z23 Encounter for immunization: Secondary | ICD-10-CM | POA: Diagnosis not present

## 2022-08-22 DIAGNOSIS — Z992 Dependence on renal dialysis: Secondary | ICD-10-CM | POA: Diagnosis not present

## 2022-08-22 DIAGNOSIS — N186 End stage renal disease: Secondary | ICD-10-CM | POA: Diagnosis not present

## 2022-08-22 DIAGNOSIS — D631 Anemia in chronic kidney disease: Secondary | ICD-10-CM | POA: Diagnosis not present

## 2022-08-22 DIAGNOSIS — N2581 Secondary hyperparathyroidism of renal origin: Secondary | ICD-10-CM | POA: Diagnosis not present

## 2022-08-24 DIAGNOSIS — N186 End stage renal disease: Secondary | ICD-10-CM | POA: Diagnosis not present

## 2022-08-24 DIAGNOSIS — N2581 Secondary hyperparathyroidism of renal origin: Secondary | ICD-10-CM | POA: Diagnosis not present

## 2022-08-24 DIAGNOSIS — Z992 Dependence on renal dialysis: Secondary | ICD-10-CM | POA: Diagnosis not present

## 2022-08-24 DIAGNOSIS — D631 Anemia in chronic kidney disease: Secondary | ICD-10-CM | POA: Diagnosis not present

## 2022-08-24 DIAGNOSIS — Z23 Encounter for immunization: Secondary | ICD-10-CM | POA: Diagnosis not present

## 2022-08-27 DIAGNOSIS — N2581 Secondary hyperparathyroidism of renal origin: Secondary | ICD-10-CM | POA: Diagnosis not present

## 2022-08-27 DIAGNOSIS — Z23 Encounter for immunization: Secondary | ICD-10-CM | POA: Diagnosis not present

## 2022-08-27 DIAGNOSIS — Z992 Dependence on renal dialysis: Secondary | ICD-10-CM | POA: Diagnosis not present

## 2022-08-27 DIAGNOSIS — N186 End stage renal disease: Secondary | ICD-10-CM | POA: Diagnosis not present

## 2022-08-27 DIAGNOSIS — D631 Anemia in chronic kidney disease: Secondary | ICD-10-CM | POA: Diagnosis not present

## 2022-08-29 DIAGNOSIS — Z23 Encounter for immunization: Secondary | ICD-10-CM | POA: Diagnosis not present

## 2022-08-29 DIAGNOSIS — N2581 Secondary hyperparathyroidism of renal origin: Secondary | ICD-10-CM | POA: Diagnosis not present

## 2022-08-29 DIAGNOSIS — D631 Anemia in chronic kidney disease: Secondary | ICD-10-CM | POA: Diagnosis not present

## 2022-08-29 DIAGNOSIS — Z992 Dependence on renal dialysis: Secondary | ICD-10-CM | POA: Diagnosis not present

## 2022-08-29 DIAGNOSIS — N186 End stage renal disease: Secondary | ICD-10-CM | POA: Diagnosis not present

## 2022-08-31 DIAGNOSIS — N2581 Secondary hyperparathyroidism of renal origin: Secondary | ICD-10-CM | POA: Diagnosis not present

## 2022-08-31 DIAGNOSIS — N186 End stage renal disease: Secondary | ICD-10-CM | POA: Diagnosis not present

## 2022-08-31 DIAGNOSIS — Z23 Encounter for immunization: Secondary | ICD-10-CM | POA: Diagnosis not present

## 2022-08-31 DIAGNOSIS — Z992 Dependence on renal dialysis: Secondary | ICD-10-CM | POA: Diagnosis not present

## 2022-08-31 DIAGNOSIS — D631 Anemia in chronic kidney disease: Secondary | ICD-10-CM | POA: Diagnosis not present

## 2022-09-03 DIAGNOSIS — N2581 Secondary hyperparathyroidism of renal origin: Secondary | ICD-10-CM | POA: Diagnosis not present

## 2022-09-03 DIAGNOSIS — N186 End stage renal disease: Secondary | ICD-10-CM | POA: Diagnosis not present

## 2022-09-03 DIAGNOSIS — D631 Anemia in chronic kidney disease: Secondary | ICD-10-CM | POA: Diagnosis not present

## 2022-09-03 DIAGNOSIS — Z992 Dependence on renal dialysis: Secondary | ICD-10-CM | POA: Diagnosis not present

## 2022-09-03 DIAGNOSIS — Z23 Encounter for immunization: Secondary | ICD-10-CM | POA: Diagnosis not present

## 2022-09-05 DIAGNOSIS — N186 End stage renal disease: Secondary | ICD-10-CM | POA: Diagnosis not present

## 2022-09-05 DIAGNOSIS — Z992 Dependence on renal dialysis: Secondary | ICD-10-CM | POA: Diagnosis not present

## 2022-09-05 DIAGNOSIS — N2581 Secondary hyperparathyroidism of renal origin: Secondary | ICD-10-CM | POA: Diagnosis not present

## 2022-09-05 DIAGNOSIS — Z23 Encounter for immunization: Secondary | ICD-10-CM | POA: Diagnosis not present

## 2022-09-05 DIAGNOSIS — D631 Anemia in chronic kidney disease: Secondary | ICD-10-CM | POA: Diagnosis not present

## 2022-09-07 DIAGNOSIS — N186 End stage renal disease: Secondary | ICD-10-CM | POA: Diagnosis not present

## 2022-09-07 DIAGNOSIS — D631 Anemia in chronic kidney disease: Secondary | ICD-10-CM | POA: Diagnosis not present

## 2022-09-07 DIAGNOSIS — Z992 Dependence on renal dialysis: Secondary | ICD-10-CM | POA: Diagnosis not present

## 2022-09-07 DIAGNOSIS — Z23 Encounter for immunization: Secondary | ICD-10-CM | POA: Diagnosis not present

## 2022-09-07 DIAGNOSIS — N2581 Secondary hyperparathyroidism of renal origin: Secondary | ICD-10-CM | POA: Diagnosis not present

## 2022-09-10 DIAGNOSIS — N2581 Secondary hyperparathyroidism of renal origin: Secondary | ICD-10-CM | POA: Diagnosis not present

## 2022-09-10 DIAGNOSIS — D631 Anemia in chronic kidney disease: Secondary | ICD-10-CM | POA: Diagnosis not present

## 2022-09-10 DIAGNOSIS — Z23 Encounter for immunization: Secondary | ICD-10-CM | POA: Diagnosis not present

## 2022-09-10 DIAGNOSIS — N186 End stage renal disease: Secondary | ICD-10-CM | POA: Diagnosis not present

## 2022-09-10 DIAGNOSIS — Z992 Dependence on renal dialysis: Secondary | ICD-10-CM | POA: Diagnosis not present

## 2022-09-12 DIAGNOSIS — N2581 Secondary hyperparathyroidism of renal origin: Secondary | ICD-10-CM | POA: Diagnosis not present

## 2022-09-12 DIAGNOSIS — N186 End stage renal disease: Secondary | ICD-10-CM | POA: Diagnosis not present

## 2022-09-12 DIAGNOSIS — D631 Anemia in chronic kidney disease: Secondary | ICD-10-CM | POA: Diagnosis not present

## 2022-09-12 DIAGNOSIS — I120 Hypertensive chronic kidney disease with stage 5 chronic kidney disease or end stage renal disease: Secondary | ICD-10-CM | POA: Diagnosis not present

## 2022-09-12 DIAGNOSIS — Z992 Dependence on renal dialysis: Secondary | ICD-10-CM | POA: Diagnosis not present

## 2022-09-13 DIAGNOSIS — N186 End stage renal disease: Secondary | ICD-10-CM | POA: Diagnosis not present

## 2022-09-13 DIAGNOSIS — N2581 Secondary hyperparathyroidism of renal origin: Secondary | ICD-10-CM | POA: Diagnosis not present

## 2022-09-13 DIAGNOSIS — Z992 Dependence on renal dialysis: Secondary | ICD-10-CM | POA: Diagnosis not present

## 2022-09-13 DIAGNOSIS — D631 Anemia in chronic kidney disease: Secondary | ICD-10-CM | POA: Diagnosis not present

## 2022-09-17 ENCOUNTER — Other Ambulatory Visit (HOSPITAL_COMMUNITY): Payer: Self-pay

## 2022-09-17 DIAGNOSIS — Z992 Dependence on renal dialysis: Secondary | ICD-10-CM | POA: Diagnosis not present

## 2022-09-17 DIAGNOSIS — D631 Anemia in chronic kidney disease: Secondary | ICD-10-CM | POA: Diagnosis not present

## 2022-09-17 DIAGNOSIS — N186 End stage renal disease: Secondary | ICD-10-CM | POA: Diagnosis not present

## 2022-09-17 DIAGNOSIS — N2581 Secondary hyperparathyroidism of renal origin: Secondary | ICD-10-CM | POA: Diagnosis not present

## 2022-09-19 DIAGNOSIS — N186 End stage renal disease: Secondary | ICD-10-CM | POA: Diagnosis not present

## 2022-09-19 DIAGNOSIS — Z992 Dependence on renal dialysis: Secondary | ICD-10-CM | POA: Diagnosis not present

## 2022-09-19 DIAGNOSIS — N2581 Secondary hyperparathyroidism of renal origin: Secondary | ICD-10-CM | POA: Diagnosis not present

## 2022-09-19 DIAGNOSIS — D631 Anemia in chronic kidney disease: Secondary | ICD-10-CM | POA: Diagnosis not present

## 2022-09-21 ENCOUNTER — Other Ambulatory Visit (HOSPITAL_COMMUNITY): Payer: Self-pay

## 2022-09-21 DIAGNOSIS — N186 End stage renal disease: Secondary | ICD-10-CM | POA: Diagnosis not present

## 2022-09-21 DIAGNOSIS — D631 Anemia in chronic kidney disease: Secondary | ICD-10-CM | POA: Diagnosis not present

## 2022-09-21 DIAGNOSIS — Z992 Dependence on renal dialysis: Secondary | ICD-10-CM | POA: Diagnosis not present

## 2022-09-21 DIAGNOSIS — N2581 Secondary hyperparathyroidism of renal origin: Secondary | ICD-10-CM | POA: Diagnosis not present

## 2022-09-24 DIAGNOSIS — Z992 Dependence on renal dialysis: Secondary | ICD-10-CM | POA: Diagnosis not present

## 2022-09-24 DIAGNOSIS — N2581 Secondary hyperparathyroidism of renal origin: Secondary | ICD-10-CM | POA: Diagnosis not present

## 2022-09-24 DIAGNOSIS — N186 End stage renal disease: Secondary | ICD-10-CM | POA: Diagnosis not present

## 2022-09-24 DIAGNOSIS — D631 Anemia in chronic kidney disease: Secondary | ICD-10-CM | POA: Diagnosis not present

## 2022-09-26 DIAGNOSIS — N2581 Secondary hyperparathyroidism of renal origin: Secondary | ICD-10-CM | POA: Diagnosis not present

## 2022-09-26 DIAGNOSIS — D631 Anemia in chronic kidney disease: Secondary | ICD-10-CM | POA: Diagnosis not present

## 2022-09-26 DIAGNOSIS — Z992 Dependence on renal dialysis: Secondary | ICD-10-CM | POA: Diagnosis not present

## 2022-09-26 DIAGNOSIS — N186 End stage renal disease: Secondary | ICD-10-CM | POA: Diagnosis not present

## 2022-09-28 DIAGNOSIS — Z992 Dependence on renal dialysis: Secondary | ICD-10-CM | POA: Diagnosis not present

## 2022-09-28 DIAGNOSIS — D631 Anemia in chronic kidney disease: Secondary | ICD-10-CM | POA: Diagnosis not present

## 2022-09-28 DIAGNOSIS — N2581 Secondary hyperparathyroidism of renal origin: Secondary | ICD-10-CM | POA: Diagnosis not present

## 2022-09-28 DIAGNOSIS — N186 End stage renal disease: Secondary | ICD-10-CM | POA: Diagnosis not present

## 2022-09-30 DIAGNOSIS — Z992 Dependence on renal dialysis: Secondary | ICD-10-CM | POA: Diagnosis not present

## 2022-09-30 DIAGNOSIS — D631 Anemia in chronic kidney disease: Secondary | ICD-10-CM | POA: Diagnosis not present

## 2022-09-30 DIAGNOSIS — N186 End stage renal disease: Secondary | ICD-10-CM | POA: Diagnosis not present

## 2022-09-30 DIAGNOSIS — N2581 Secondary hyperparathyroidism of renal origin: Secondary | ICD-10-CM | POA: Diagnosis not present

## 2022-10-02 DIAGNOSIS — N186 End stage renal disease: Secondary | ICD-10-CM | POA: Diagnosis not present

## 2022-10-02 DIAGNOSIS — N2581 Secondary hyperparathyroidism of renal origin: Secondary | ICD-10-CM | POA: Diagnosis not present

## 2022-10-02 DIAGNOSIS — D631 Anemia in chronic kidney disease: Secondary | ICD-10-CM | POA: Diagnosis not present

## 2022-10-02 DIAGNOSIS — Z992 Dependence on renal dialysis: Secondary | ICD-10-CM | POA: Diagnosis not present

## 2022-10-05 DIAGNOSIS — N2581 Secondary hyperparathyroidism of renal origin: Secondary | ICD-10-CM | POA: Diagnosis not present

## 2022-10-05 DIAGNOSIS — D631 Anemia in chronic kidney disease: Secondary | ICD-10-CM | POA: Diagnosis not present

## 2022-10-05 DIAGNOSIS — Z992 Dependence on renal dialysis: Secondary | ICD-10-CM | POA: Diagnosis not present

## 2022-10-05 DIAGNOSIS — N186 End stage renal disease: Secondary | ICD-10-CM | POA: Diagnosis not present

## 2022-10-08 DIAGNOSIS — Z992 Dependence on renal dialysis: Secondary | ICD-10-CM | POA: Diagnosis not present

## 2022-10-08 DIAGNOSIS — N186 End stage renal disease: Secondary | ICD-10-CM | POA: Diagnosis not present

## 2022-10-08 DIAGNOSIS — N2581 Secondary hyperparathyroidism of renal origin: Secondary | ICD-10-CM | POA: Diagnosis not present

## 2022-10-08 DIAGNOSIS — D631 Anemia in chronic kidney disease: Secondary | ICD-10-CM | POA: Diagnosis not present

## 2022-10-10 DIAGNOSIS — Z992 Dependence on renal dialysis: Secondary | ICD-10-CM | POA: Diagnosis not present

## 2022-10-10 DIAGNOSIS — N2581 Secondary hyperparathyroidism of renal origin: Secondary | ICD-10-CM | POA: Diagnosis not present

## 2022-10-10 DIAGNOSIS — D631 Anemia in chronic kidney disease: Secondary | ICD-10-CM | POA: Diagnosis not present

## 2022-10-10 DIAGNOSIS — N186 End stage renal disease: Secondary | ICD-10-CM | POA: Diagnosis not present

## 2022-10-12 DIAGNOSIS — Z992 Dependence on renal dialysis: Secondary | ICD-10-CM | POA: Diagnosis not present

## 2022-10-12 DIAGNOSIS — I120 Hypertensive chronic kidney disease with stage 5 chronic kidney disease or end stage renal disease: Secondary | ICD-10-CM | POA: Diagnosis not present

## 2022-10-12 DIAGNOSIS — N2581 Secondary hyperparathyroidism of renal origin: Secondary | ICD-10-CM | POA: Diagnosis not present

## 2022-10-12 DIAGNOSIS — N186 End stage renal disease: Secondary | ICD-10-CM | POA: Diagnosis not present

## 2022-10-12 DIAGNOSIS — D631 Anemia in chronic kidney disease: Secondary | ICD-10-CM | POA: Diagnosis not present

## 2022-10-15 DIAGNOSIS — Z992 Dependence on renal dialysis: Secondary | ICD-10-CM | POA: Diagnosis not present

## 2022-10-15 DIAGNOSIS — D631 Anemia in chronic kidney disease: Secondary | ICD-10-CM | POA: Diagnosis not present

## 2022-10-15 DIAGNOSIS — N2581 Secondary hyperparathyroidism of renal origin: Secondary | ICD-10-CM | POA: Diagnosis not present

## 2022-10-15 DIAGNOSIS — N186 End stage renal disease: Secondary | ICD-10-CM | POA: Diagnosis not present

## 2022-10-16 ENCOUNTER — Other Ambulatory Visit (HOSPITAL_COMMUNITY): Payer: Self-pay

## 2022-10-17 DIAGNOSIS — N2581 Secondary hyperparathyroidism of renal origin: Secondary | ICD-10-CM | POA: Diagnosis not present

## 2022-10-17 DIAGNOSIS — D631 Anemia in chronic kidney disease: Secondary | ICD-10-CM | POA: Diagnosis not present

## 2022-10-17 DIAGNOSIS — N186 End stage renal disease: Secondary | ICD-10-CM | POA: Diagnosis not present

## 2022-10-17 DIAGNOSIS — Z992 Dependence on renal dialysis: Secondary | ICD-10-CM | POA: Diagnosis not present

## 2022-10-18 ENCOUNTER — Other Ambulatory Visit (HOSPITAL_COMMUNITY): Payer: Self-pay

## 2022-10-19 ENCOUNTER — Other Ambulatory Visit (HOSPITAL_COMMUNITY): Payer: Self-pay

## 2022-10-19 DIAGNOSIS — Z992 Dependence on renal dialysis: Secondary | ICD-10-CM | POA: Diagnosis not present

## 2022-10-19 DIAGNOSIS — N186 End stage renal disease: Secondary | ICD-10-CM | POA: Diagnosis not present

## 2022-10-19 DIAGNOSIS — D631 Anemia in chronic kidney disease: Secondary | ICD-10-CM | POA: Diagnosis not present

## 2022-10-19 DIAGNOSIS — N2581 Secondary hyperparathyroidism of renal origin: Secondary | ICD-10-CM | POA: Diagnosis not present

## 2022-10-22 DIAGNOSIS — D631 Anemia in chronic kidney disease: Secondary | ICD-10-CM | POA: Diagnosis not present

## 2022-10-22 DIAGNOSIS — Z992 Dependence on renal dialysis: Secondary | ICD-10-CM | POA: Diagnosis not present

## 2022-10-22 DIAGNOSIS — N2581 Secondary hyperparathyroidism of renal origin: Secondary | ICD-10-CM | POA: Diagnosis not present

## 2022-10-22 DIAGNOSIS — N186 End stage renal disease: Secondary | ICD-10-CM | POA: Diagnosis not present

## 2022-10-24 DIAGNOSIS — D631 Anemia in chronic kidney disease: Secondary | ICD-10-CM | POA: Diagnosis not present

## 2022-10-24 DIAGNOSIS — N2581 Secondary hyperparathyroidism of renal origin: Secondary | ICD-10-CM | POA: Diagnosis not present

## 2022-10-24 DIAGNOSIS — N186 End stage renal disease: Secondary | ICD-10-CM | POA: Diagnosis not present

## 2022-10-24 DIAGNOSIS — Z992 Dependence on renal dialysis: Secondary | ICD-10-CM | POA: Diagnosis not present

## 2022-10-26 DIAGNOSIS — N186 End stage renal disease: Secondary | ICD-10-CM | POA: Diagnosis not present

## 2022-10-26 DIAGNOSIS — N2581 Secondary hyperparathyroidism of renal origin: Secondary | ICD-10-CM | POA: Diagnosis not present

## 2022-10-26 DIAGNOSIS — Z992 Dependence on renal dialysis: Secondary | ICD-10-CM | POA: Diagnosis not present

## 2022-10-26 DIAGNOSIS — D631 Anemia in chronic kidney disease: Secondary | ICD-10-CM | POA: Diagnosis not present

## 2022-10-29 DIAGNOSIS — N2581 Secondary hyperparathyroidism of renal origin: Secondary | ICD-10-CM | POA: Diagnosis not present

## 2022-10-29 DIAGNOSIS — N186 End stage renal disease: Secondary | ICD-10-CM | POA: Diagnosis not present

## 2022-10-29 DIAGNOSIS — Z992 Dependence on renal dialysis: Secondary | ICD-10-CM | POA: Diagnosis not present

## 2022-10-29 DIAGNOSIS — D631 Anemia in chronic kidney disease: Secondary | ICD-10-CM | POA: Diagnosis not present

## 2022-10-31 DIAGNOSIS — N186 End stage renal disease: Secondary | ICD-10-CM | POA: Diagnosis not present

## 2022-10-31 DIAGNOSIS — D631 Anemia in chronic kidney disease: Secondary | ICD-10-CM | POA: Diagnosis not present

## 2022-10-31 DIAGNOSIS — Z992 Dependence on renal dialysis: Secondary | ICD-10-CM | POA: Diagnosis not present

## 2022-10-31 DIAGNOSIS — N2581 Secondary hyperparathyroidism of renal origin: Secondary | ICD-10-CM | POA: Diagnosis not present

## 2022-11-02 DIAGNOSIS — N2581 Secondary hyperparathyroidism of renal origin: Secondary | ICD-10-CM | POA: Diagnosis not present

## 2022-11-02 DIAGNOSIS — Z992 Dependence on renal dialysis: Secondary | ICD-10-CM | POA: Diagnosis not present

## 2022-11-02 DIAGNOSIS — N186 End stage renal disease: Secondary | ICD-10-CM | POA: Diagnosis not present

## 2022-11-02 DIAGNOSIS — D631 Anemia in chronic kidney disease: Secondary | ICD-10-CM | POA: Diagnosis not present

## 2022-11-04 DIAGNOSIS — N186 End stage renal disease: Secondary | ICD-10-CM | POA: Diagnosis not present

## 2022-11-04 DIAGNOSIS — Z992 Dependence on renal dialysis: Secondary | ICD-10-CM | POA: Diagnosis not present

## 2022-11-04 DIAGNOSIS — N2581 Secondary hyperparathyroidism of renal origin: Secondary | ICD-10-CM | POA: Diagnosis not present

## 2022-11-04 DIAGNOSIS — D631 Anemia in chronic kidney disease: Secondary | ICD-10-CM | POA: Diagnosis not present

## 2022-11-07 DIAGNOSIS — Z992 Dependence on renal dialysis: Secondary | ICD-10-CM | POA: Diagnosis not present

## 2022-11-07 DIAGNOSIS — N2581 Secondary hyperparathyroidism of renal origin: Secondary | ICD-10-CM | POA: Diagnosis not present

## 2022-11-07 DIAGNOSIS — N186 End stage renal disease: Secondary | ICD-10-CM | POA: Diagnosis not present

## 2022-11-07 DIAGNOSIS — D631 Anemia in chronic kidney disease: Secondary | ICD-10-CM | POA: Diagnosis not present

## 2022-11-09 DIAGNOSIS — N2581 Secondary hyperparathyroidism of renal origin: Secondary | ICD-10-CM | POA: Diagnosis not present

## 2022-11-09 DIAGNOSIS — Z992 Dependence on renal dialysis: Secondary | ICD-10-CM | POA: Diagnosis not present

## 2022-11-09 DIAGNOSIS — N186 End stage renal disease: Secondary | ICD-10-CM | POA: Diagnosis not present

## 2022-11-09 DIAGNOSIS — D631 Anemia in chronic kidney disease: Secondary | ICD-10-CM | POA: Diagnosis not present

## 2022-11-11 DIAGNOSIS — D631 Anemia in chronic kidney disease: Secondary | ICD-10-CM | POA: Diagnosis not present

## 2022-11-11 DIAGNOSIS — N186 End stage renal disease: Secondary | ICD-10-CM | POA: Diagnosis not present

## 2022-11-11 DIAGNOSIS — N2581 Secondary hyperparathyroidism of renal origin: Secondary | ICD-10-CM | POA: Diagnosis not present

## 2022-11-11 DIAGNOSIS — Z992 Dependence on renal dialysis: Secondary | ICD-10-CM | POA: Diagnosis not present

## 2022-11-12 DIAGNOSIS — I120 Hypertensive chronic kidney disease with stage 5 chronic kidney disease or end stage renal disease: Secondary | ICD-10-CM | POA: Diagnosis not present

## 2022-11-12 DIAGNOSIS — Z992 Dependence on renal dialysis: Secondary | ICD-10-CM | POA: Diagnosis not present

## 2022-11-12 DIAGNOSIS — N186 End stage renal disease: Secondary | ICD-10-CM | POA: Diagnosis not present

## 2022-11-13 ENCOUNTER — Other Ambulatory Visit (HOSPITAL_COMMUNITY): Payer: Self-pay

## 2022-11-14 DIAGNOSIS — N186 End stage renal disease: Secondary | ICD-10-CM | POA: Diagnosis not present

## 2022-11-14 DIAGNOSIS — N2581 Secondary hyperparathyroidism of renal origin: Secondary | ICD-10-CM | POA: Diagnosis not present

## 2022-11-14 DIAGNOSIS — D631 Anemia in chronic kidney disease: Secondary | ICD-10-CM | POA: Diagnosis not present

## 2022-11-14 DIAGNOSIS — Z992 Dependence on renal dialysis: Secondary | ICD-10-CM | POA: Diagnosis not present

## 2022-11-15 ENCOUNTER — Other Ambulatory Visit (HOSPITAL_COMMUNITY): Payer: Self-pay

## 2022-11-16 DIAGNOSIS — N186 End stage renal disease: Secondary | ICD-10-CM | POA: Diagnosis not present

## 2022-11-16 DIAGNOSIS — D631 Anemia in chronic kidney disease: Secondary | ICD-10-CM | POA: Diagnosis not present

## 2022-11-16 DIAGNOSIS — N2581 Secondary hyperparathyroidism of renal origin: Secondary | ICD-10-CM | POA: Diagnosis not present

## 2022-11-16 DIAGNOSIS — Z992 Dependence on renal dialysis: Secondary | ICD-10-CM | POA: Diagnosis not present

## 2022-11-19 DIAGNOSIS — Z992 Dependence on renal dialysis: Secondary | ICD-10-CM | POA: Diagnosis not present

## 2022-11-19 DIAGNOSIS — D631 Anemia in chronic kidney disease: Secondary | ICD-10-CM | POA: Diagnosis not present

## 2022-11-19 DIAGNOSIS — N2581 Secondary hyperparathyroidism of renal origin: Secondary | ICD-10-CM | POA: Diagnosis not present

## 2022-11-19 DIAGNOSIS — N186 End stage renal disease: Secondary | ICD-10-CM | POA: Diagnosis not present

## 2022-11-20 ENCOUNTER — Other Ambulatory Visit: Payer: Self-pay

## 2022-11-21 DIAGNOSIS — D631 Anemia in chronic kidney disease: Secondary | ICD-10-CM | POA: Diagnosis not present

## 2022-11-21 DIAGNOSIS — N186 End stage renal disease: Secondary | ICD-10-CM | POA: Diagnosis not present

## 2022-11-21 DIAGNOSIS — Z992 Dependence on renal dialysis: Secondary | ICD-10-CM | POA: Diagnosis not present

## 2022-11-21 DIAGNOSIS — N2581 Secondary hyperparathyroidism of renal origin: Secondary | ICD-10-CM | POA: Diagnosis not present

## 2022-11-23 DIAGNOSIS — Z992 Dependence on renal dialysis: Secondary | ICD-10-CM | POA: Diagnosis not present

## 2022-11-23 DIAGNOSIS — D631 Anemia in chronic kidney disease: Secondary | ICD-10-CM | POA: Diagnosis not present

## 2022-11-23 DIAGNOSIS — N186 End stage renal disease: Secondary | ICD-10-CM | POA: Diagnosis not present

## 2022-11-23 DIAGNOSIS — N2581 Secondary hyperparathyroidism of renal origin: Secondary | ICD-10-CM | POA: Diagnosis not present

## 2022-11-26 DIAGNOSIS — Z992 Dependence on renal dialysis: Secondary | ICD-10-CM | POA: Diagnosis not present

## 2022-11-26 DIAGNOSIS — N186 End stage renal disease: Secondary | ICD-10-CM | POA: Diagnosis not present

## 2022-11-26 DIAGNOSIS — D631 Anemia in chronic kidney disease: Secondary | ICD-10-CM | POA: Diagnosis not present

## 2022-11-26 DIAGNOSIS — N2581 Secondary hyperparathyroidism of renal origin: Secondary | ICD-10-CM | POA: Diagnosis not present

## 2022-11-28 DIAGNOSIS — N186 End stage renal disease: Secondary | ICD-10-CM | POA: Diagnosis not present

## 2022-11-28 DIAGNOSIS — Z992 Dependence on renal dialysis: Secondary | ICD-10-CM | POA: Diagnosis not present

## 2022-11-28 DIAGNOSIS — N2581 Secondary hyperparathyroidism of renal origin: Secondary | ICD-10-CM | POA: Diagnosis not present

## 2022-11-28 DIAGNOSIS — D631 Anemia in chronic kidney disease: Secondary | ICD-10-CM | POA: Diagnosis not present

## 2022-11-30 DIAGNOSIS — N186 End stage renal disease: Secondary | ICD-10-CM | POA: Diagnosis not present

## 2022-11-30 DIAGNOSIS — D631 Anemia in chronic kidney disease: Secondary | ICD-10-CM | POA: Diagnosis not present

## 2022-11-30 DIAGNOSIS — N2581 Secondary hyperparathyroidism of renal origin: Secondary | ICD-10-CM | POA: Diagnosis not present

## 2022-11-30 DIAGNOSIS — Z992 Dependence on renal dialysis: Secondary | ICD-10-CM | POA: Diagnosis not present

## 2022-12-03 DIAGNOSIS — Z992 Dependence on renal dialysis: Secondary | ICD-10-CM | POA: Diagnosis not present

## 2022-12-03 DIAGNOSIS — N2581 Secondary hyperparathyroidism of renal origin: Secondary | ICD-10-CM | POA: Diagnosis not present

## 2022-12-03 DIAGNOSIS — N186 End stage renal disease: Secondary | ICD-10-CM | POA: Diagnosis not present

## 2022-12-03 DIAGNOSIS — D631 Anemia in chronic kidney disease: Secondary | ICD-10-CM | POA: Diagnosis not present

## 2022-12-05 DIAGNOSIS — Z992 Dependence on renal dialysis: Secondary | ICD-10-CM | POA: Diagnosis not present

## 2022-12-05 DIAGNOSIS — N186 End stage renal disease: Secondary | ICD-10-CM | POA: Diagnosis not present

## 2022-12-05 DIAGNOSIS — D631 Anemia in chronic kidney disease: Secondary | ICD-10-CM | POA: Diagnosis not present

## 2022-12-05 DIAGNOSIS — N2581 Secondary hyperparathyroidism of renal origin: Secondary | ICD-10-CM | POA: Diagnosis not present

## 2022-12-07 DIAGNOSIS — Z992 Dependence on renal dialysis: Secondary | ICD-10-CM | POA: Diagnosis not present

## 2022-12-07 DIAGNOSIS — D631 Anemia in chronic kidney disease: Secondary | ICD-10-CM | POA: Diagnosis not present

## 2022-12-07 DIAGNOSIS — N186 End stage renal disease: Secondary | ICD-10-CM | POA: Diagnosis not present

## 2022-12-07 DIAGNOSIS — N2581 Secondary hyperparathyroidism of renal origin: Secondary | ICD-10-CM | POA: Diagnosis not present

## 2022-12-10 DIAGNOSIS — Z992 Dependence on renal dialysis: Secondary | ICD-10-CM | POA: Diagnosis not present

## 2022-12-10 DIAGNOSIS — D631 Anemia in chronic kidney disease: Secondary | ICD-10-CM | POA: Diagnosis not present

## 2022-12-10 DIAGNOSIS — N2581 Secondary hyperparathyroidism of renal origin: Secondary | ICD-10-CM | POA: Diagnosis not present

## 2022-12-10 DIAGNOSIS — N186 End stage renal disease: Secondary | ICD-10-CM | POA: Diagnosis not present

## 2022-12-12 ENCOUNTER — Other Ambulatory Visit (HOSPITAL_COMMUNITY): Payer: Self-pay

## 2022-12-12 DIAGNOSIS — Z992 Dependence on renal dialysis: Secondary | ICD-10-CM | POA: Diagnosis not present

## 2022-12-12 DIAGNOSIS — N2581 Secondary hyperparathyroidism of renal origin: Secondary | ICD-10-CM | POA: Diagnosis not present

## 2022-12-12 DIAGNOSIS — N186 End stage renal disease: Secondary | ICD-10-CM | POA: Diagnosis not present

## 2022-12-12 DIAGNOSIS — D631 Anemia in chronic kidney disease: Secondary | ICD-10-CM | POA: Diagnosis not present

## 2022-12-13 DIAGNOSIS — Z992 Dependence on renal dialysis: Secondary | ICD-10-CM | POA: Diagnosis not present

## 2022-12-13 DIAGNOSIS — N186 End stage renal disease: Secondary | ICD-10-CM | POA: Diagnosis not present

## 2022-12-13 DIAGNOSIS — I120 Hypertensive chronic kidney disease with stage 5 chronic kidney disease or end stage renal disease: Secondary | ICD-10-CM | POA: Diagnosis not present

## 2022-12-14 ENCOUNTER — Other Ambulatory Visit (HOSPITAL_COMMUNITY): Payer: Self-pay

## 2022-12-14 DIAGNOSIS — N186 End stage renal disease: Secondary | ICD-10-CM | POA: Diagnosis not present

## 2022-12-14 DIAGNOSIS — D631 Anemia in chronic kidney disease: Secondary | ICD-10-CM | POA: Diagnosis not present

## 2022-12-14 DIAGNOSIS — N2581 Secondary hyperparathyroidism of renal origin: Secondary | ICD-10-CM | POA: Diagnosis not present

## 2022-12-14 DIAGNOSIS — Z992 Dependence on renal dialysis: Secondary | ICD-10-CM | POA: Diagnosis not present

## 2022-12-17 ENCOUNTER — Other Ambulatory Visit (HOSPITAL_COMMUNITY): Payer: Self-pay

## 2022-12-17 ENCOUNTER — Other Ambulatory Visit: Payer: Self-pay

## 2022-12-17 DIAGNOSIS — D631 Anemia in chronic kidney disease: Secondary | ICD-10-CM | POA: Diagnosis not present

## 2022-12-17 DIAGNOSIS — N2581 Secondary hyperparathyroidism of renal origin: Secondary | ICD-10-CM | POA: Diagnosis not present

## 2022-12-17 DIAGNOSIS — N186 End stage renal disease: Secondary | ICD-10-CM | POA: Diagnosis not present

## 2022-12-17 DIAGNOSIS — Z992 Dependence on renal dialysis: Secondary | ICD-10-CM | POA: Diagnosis not present

## 2022-12-19 DIAGNOSIS — N186 End stage renal disease: Secondary | ICD-10-CM | POA: Diagnosis not present

## 2022-12-19 DIAGNOSIS — N2581 Secondary hyperparathyroidism of renal origin: Secondary | ICD-10-CM | POA: Diagnosis not present

## 2022-12-19 DIAGNOSIS — D631 Anemia in chronic kidney disease: Secondary | ICD-10-CM | POA: Diagnosis not present

## 2022-12-19 DIAGNOSIS — Z992 Dependence on renal dialysis: Secondary | ICD-10-CM | POA: Diagnosis not present

## 2022-12-21 DIAGNOSIS — N2581 Secondary hyperparathyroidism of renal origin: Secondary | ICD-10-CM | POA: Diagnosis not present

## 2022-12-21 DIAGNOSIS — Z992 Dependence on renal dialysis: Secondary | ICD-10-CM | POA: Diagnosis not present

## 2022-12-21 DIAGNOSIS — N186 End stage renal disease: Secondary | ICD-10-CM | POA: Diagnosis not present

## 2022-12-21 DIAGNOSIS — D631 Anemia in chronic kidney disease: Secondary | ICD-10-CM | POA: Diagnosis not present

## 2022-12-24 DIAGNOSIS — N186 End stage renal disease: Secondary | ICD-10-CM | POA: Diagnosis not present

## 2022-12-24 DIAGNOSIS — D631 Anemia in chronic kidney disease: Secondary | ICD-10-CM | POA: Diagnosis not present

## 2022-12-24 DIAGNOSIS — Z992 Dependence on renal dialysis: Secondary | ICD-10-CM | POA: Diagnosis not present

## 2022-12-24 DIAGNOSIS — N2581 Secondary hyperparathyroidism of renal origin: Secondary | ICD-10-CM | POA: Diagnosis not present

## 2022-12-26 DIAGNOSIS — D631 Anemia in chronic kidney disease: Secondary | ICD-10-CM | POA: Diagnosis not present

## 2022-12-26 DIAGNOSIS — N2581 Secondary hyperparathyroidism of renal origin: Secondary | ICD-10-CM | POA: Diagnosis not present

## 2022-12-26 DIAGNOSIS — N186 End stage renal disease: Secondary | ICD-10-CM | POA: Diagnosis not present

## 2022-12-26 DIAGNOSIS — Z992 Dependence on renal dialysis: Secondary | ICD-10-CM | POA: Diagnosis not present

## 2022-12-28 DIAGNOSIS — D631 Anemia in chronic kidney disease: Secondary | ICD-10-CM | POA: Diagnosis not present

## 2022-12-28 DIAGNOSIS — N2581 Secondary hyperparathyroidism of renal origin: Secondary | ICD-10-CM | POA: Diagnosis not present

## 2022-12-28 DIAGNOSIS — N186 End stage renal disease: Secondary | ICD-10-CM | POA: Diagnosis not present

## 2022-12-28 DIAGNOSIS — Z992 Dependence on renal dialysis: Secondary | ICD-10-CM | POA: Diagnosis not present

## 2022-12-31 DIAGNOSIS — D631 Anemia in chronic kidney disease: Secondary | ICD-10-CM | POA: Diagnosis not present

## 2022-12-31 DIAGNOSIS — N186 End stage renal disease: Secondary | ICD-10-CM | POA: Diagnosis not present

## 2022-12-31 DIAGNOSIS — Z992 Dependence on renal dialysis: Secondary | ICD-10-CM | POA: Diagnosis not present

## 2022-12-31 DIAGNOSIS — N2581 Secondary hyperparathyroidism of renal origin: Secondary | ICD-10-CM | POA: Diagnosis not present

## 2023-01-02 DIAGNOSIS — D631 Anemia in chronic kidney disease: Secondary | ICD-10-CM | POA: Diagnosis not present

## 2023-01-02 DIAGNOSIS — Z992 Dependence on renal dialysis: Secondary | ICD-10-CM | POA: Diagnosis not present

## 2023-01-02 DIAGNOSIS — N186 End stage renal disease: Secondary | ICD-10-CM | POA: Diagnosis not present

## 2023-01-02 DIAGNOSIS — N2581 Secondary hyperparathyroidism of renal origin: Secondary | ICD-10-CM | POA: Diagnosis not present

## 2023-01-04 DIAGNOSIS — N2581 Secondary hyperparathyroidism of renal origin: Secondary | ICD-10-CM | POA: Diagnosis not present

## 2023-01-04 DIAGNOSIS — N186 End stage renal disease: Secondary | ICD-10-CM | POA: Diagnosis not present

## 2023-01-04 DIAGNOSIS — D631 Anemia in chronic kidney disease: Secondary | ICD-10-CM | POA: Diagnosis not present

## 2023-01-04 DIAGNOSIS — Z992 Dependence on renal dialysis: Secondary | ICD-10-CM | POA: Diagnosis not present

## 2023-01-07 DIAGNOSIS — Z992 Dependence on renal dialysis: Secondary | ICD-10-CM | POA: Diagnosis not present

## 2023-01-07 DIAGNOSIS — N186 End stage renal disease: Secondary | ICD-10-CM | POA: Diagnosis not present

## 2023-01-07 DIAGNOSIS — N2581 Secondary hyperparathyroidism of renal origin: Secondary | ICD-10-CM | POA: Diagnosis not present

## 2023-01-07 DIAGNOSIS — D631 Anemia in chronic kidney disease: Secondary | ICD-10-CM | POA: Diagnosis not present

## 2023-01-09 DIAGNOSIS — N186 End stage renal disease: Secondary | ICD-10-CM | POA: Diagnosis not present

## 2023-01-09 DIAGNOSIS — Z992 Dependence on renal dialysis: Secondary | ICD-10-CM | POA: Diagnosis not present

## 2023-01-09 DIAGNOSIS — N2581 Secondary hyperparathyroidism of renal origin: Secondary | ICD-10-CM | POA: Diagnosis not present

## 2023-01-09 DIAGNOSIS — D631 Anemia in chronic kidney disease: Secondary | ICD-10-CM | POA: Diagnosis not present

## 2023-01-11 DIAGNOSIS — Z992 Dependence on renal dialysis: Secondary | ICD-10-CM | POA: Diagnosis not present

## 2023-01-11 DIAGNOSIS — N186 End stage renal disease: Secondary | ICD-10-CM | POA: Diagnosis not present

## 2023-01-11 DIAGNOSIS — I120 Hypertensive chronic kidney disease with stage 5 chronic kidney disease or end stage renal disease: Secondary | ICD-10-CM | POA: Diagnosis not present

## 2023-01-11 DIAGNOSIS — N2581 Secondary hyperparathyroidism of renal origin: Secondary | ICD-10-CM | POA: Diagnosis not present

## 2023-01-14 DIAGNOSIS — Z992 Dependence on renal dialysis: Secondary | ICD-10-CM | POA: Diagnosis not present

## 2023-01-14 DIAGNOSIS — N186 End stage renal disease: Secondary | ICD-10-CM | POA: Diagnosis not present

## 2023-01-14 DIAGNOSIS — N2581 Secondary hyperparathyroidism of renal origin: Secondary | ICD-10-CM | POA: Diagnosis not present

## 2023-01-15 ENCOUNTER — Other Ambulatory Visit: Payer: Self-pay | Admitting: Infectious Disease

## 2023-01-15 ENCOUNTER — Other Ambulatory Visit (HOSPITAL_COMMUNITY): Payer: Self-pay

## 2023-01-15 DIAGNOSIS — B20 Human immunodeficiency virus [HIV] disease: Secondary | ICD-10-CM

## 2023-01-15 MED ORDER — BIKTARVY 50-200-25 MG PO TABS
1.0000 | ORAL_TABLET | Freq: Every day | ORAL | 0 refills | Status: DC
  Filled 2023-01-15: qty 30, 30d supply, fill #0

## 2023-01-16 DIAGNOSIS — N2581 Secondary hyperparathyroidism of renal origin: Secondary | ICD-10-CM | POA: Diagnosis not present

## 2023-01-16 DIAGNOSIS — Z992 Dependence on renal dialysis: Secondary | ICD-10-CM | POA: Diagnosis not present

## 2023-01-16 DIAGNOSIS — N186 End stage renal disease: Secondary | ICD-10-CM | POA: Diagnosis not present

## 2023-01-18 ENCOUNTER — Other Ambulatory Visit (HOSPITAL_COMMUNITY): Payer: Self-pay

## 2023-01-18 DIAGNOSIS — N186 End stage renal disease: Secondary | ICD-10-CM | POA: Diagnosis not present

## 2023-01-18 DIAGNOSIS — Z992 Dependence on renal dialysis: Secondary | ICD-10-CM | POA: Diagnosis not present

## 2023-01-18 DIAGNOSIS — N2581 Secondary hyperparathyroidism of renal origin: Secondary | ICD-10-CM | POA: Diagnosis not present

## 2023-01-21 ENCOUNTER — Encounter (HOSPITAL_COMMUNITY): Payer: Self-pay | Admitting: *Deleted

## 2023-01-21 DIAGNOSIS — N2581 Secondary hyperparathyroidism of renal origin: Secondary | ICD-10-CM | POA: Diagnosis not present

## 2023-01-21 DIAGNOSIS — Z992 Dependence on renal dialysis: Secondary | ICD-10-CM | POA: Diagnosis not present

## 2023-01-21 DIAGNOSIS — N186 End stage renal disease: Secondary | ICD-10-CM | POA: Diagnosis not present

## 2023-01-23 DIAGNOSIS — N186 End stage renal disease: Secondary | ICD-10-CM | POA: Diagnosis not present

## 2023-01-23 DIAGNOSIS — Z992 Dependence on renal dialysis: Secondary | ICD-10-CM | POA: Diagnosis not present

## 2023-01-23 DIAGNOSIS — N2581 Secondary hyperparathyroidism of renal origin: Secondary | ICD-10-CM | POA: Diagnosis not present

## 2023-01-25 DIAGNOSIS — N186 End stage renal disease: Secondary | ICD-10-CM | POA: Diagnosis not present

## 2023-01-25 DIAGNOSIS — N2581 Secondary hyperparathyroidism of renal origin: Secondary | ICD-10-CM | POA: Diagnosis not present

## 2023-01-25 DIAGNOSIS — Z992 Dependence on renal dialysis: Secondary | ICD-10-CM | POA: Diagnosis not present

## 2023-01-28 DIAGNOSIS — Z992 Dependence on renal dialysis: Secondary | ICD-10-CM | POA: Diagnosis not present

## 2023-01-28 DIAGNOSIS — N2581 Secondary hyperparathyroidism of renal origin: Secondary | ICD-10-CM | POA: Diagnosis not present

## 2023-01-28 DIAGNOSIS — N186 End stage renal disease: Secondary | ICD-10-CM | POA: Diagnosis not present

## 2023-01-30 DIAGNOSIS — N186 End stage renal disease: Secondary | ICD-10-CM | POA: Diagnosis not present

## 2023-01-30 DIAGNOSIS — N2581 Secondary hyperparathyroidism of renal origin: Secondary | ICD-10-CM | POA: Diagnosis not present

## 2023-01-30 DIAGNOSIS — Z992 Dependence on renal dialysis: Secondary | ICD-10-CM | POA: Diagnosis not present

## 2023-02-01 DIAGNOSIS — Z992 Dependence on renal dialysis: Secondary | ICD-10-CM | POA: Diagnosis not present

## 2023-02-01 DIAGNOSIS — N186 End stage renal disease: Secondary | ICD-10-CM | POA: Diagnosis not present

## 2023-02-01 DIAGNOSIS — N2581 Secondary hyperparathyroidism of renal origin: Secondary | ICD-10-CM | POA: Diagnosis not present

## 2023-02-04 DIAGNOSIS — N186 End stage renal disease: Secondary | ICD-10-CM | POA: Diagnosis not present

## 2023-02-04 DIAGNOSIS — Z992 Dependence on renal dialysis: Secondary | ICD-10-CM | POA: Diagnosis not present

## 2023-02-04 DIAGNOSIS — N2581 Secondary hyperparathyroidism of renal origin: Secondary | ICD-10-CM | POA: Diagnosis not present

## 2023-02-05 ENCOUNTER — Encounter: Payer: Self-pay | Admitting: Infectious Disease

## 2023-02-05 ENCOUNTER — Other Ambulatory Visit (HOSPITAL_COMMUNITY): Payer: Self-pay

## 2023-02-05 ENCOUNTER — Ambulatory Visit (INDEPENDENT_AMBULATORY_CARE_PROVIDER_SITE_OTHER): Payer: Medicare Other | Admitting: Infectious Disease

## 2023-02-05 ENCOUNTER — Other Ambulatory Visit: Payer: Self-pay

## 2023-02-05 VITALS — BP 109/72 | HR 67 | Temp 96.3°F | Ht 61.0 in | Wt 196.0 lb

## 2023-02-05 DIAGNOSIS — E785 Hyperlipidemia, unspecified: Secondary | ICD-10-CM

## 2023-02-05 DIAGNOSIS — Z992 Dependence on renal dialysis: Secondary | ICD-10-CM

## 2023-02-05 DIAGNOSIS — G459 Transient cerebral ischemic attack, unspecified: Secondary | ICD-10-CM | POA: Diagnosis not present

## 2023-02-05 DIAGNOSIS — B20 Human immunodeficiency virus [HIV] disease: Secondary | ICD-10-CM | POA: Diagnosis not present

## 2023-02-05 DIAGNOSIS — Z7682 Awaiting organ transplant status: Secondary | ICD-10-CM | POA: Diagnosis not present

## 2023-02-05 DIAGNOSIS — N186 End stage renal disease: Secondary | ICD-10-CM | POA: Diagnosis not present

## 2023-02-05 DIAGNOSIS — I120 Hypertensive chronic kidney disease with stage 5 chronic kidney disease or end stage renal disease: Secondary | ICD-10-CM

## 2023-02-05 DIAGNOSIS — Z23 Encounter for immunization: Secondary | ICD-10-CM | POA: Diagnosis not present

## 2023-02-05 DIAGNOSIS — I10 Essential (primary) hypertension: Secondary | ICD-10-CM

## 2023-02-05 MED ORDER — ROSUVASTATIN CALCIUM 5 MG PO TABS
5.0000 mg | ORAL_TABLET | Freq: Every day | ORAL | 11 refills | Status: DC
  Filled 2023-02-05: qty 30, 30d supply, fill #0

## 2023-02-05 MED ORDER — BIKTARVY 50-200-25 MG PO TABS
1.0000 | ORAL_TABLET | Freq: Every day | ORAL | 0 refills | Status: DC
  Filled 2023-02-05 – 2023-02-07 (×2): qty 30, 30d supply, fill #0

## 2023-02-05 MED ORDER — ROSUVASTATIN CALCIUM 20 MG PO TABS
20.0000 mg | ORAL_TABLET | Freq: Every day | ORAL | 11 refills | Status: DC
  Filled 2023-02-05: qty 30, 30d supply, fill #0

## 2023-02-05 NOTE — Progress Notes (Signed)
Subjective:   Chief complaint follow-up for HIV disease on medications   Patient ID: Tiffany Golden, female    DOB: 04/07/1976, 47 y.o.   MRN: [id number]  HPI  47 y.o. female past medical history significant for HIV and AIDS that was diagnosed approximately 20 years ago.   She has been on various antiretroviral regimens and has had virological failure with resistance.  She has maintained perfect virological suppression on Biktarvy with 2 active drugs--(she has 180 4V on genotype from March 05, 2017)   She remains on dialysis but is on the transplant list at [facility].   I also obtained a Genosure Achive which showed      She is doing well on Biktarvy without problems.  Past Medical History:  Diagnosis Date   Anemia    Chronic kidney disease    Complication of anesthesia    Dialysis patient ([place])    mon, wed friday   ESRD (end stage renal disease) ([place])    HIV infection ([place])    Hypertension    PONV (postoperative nausea and vomiting)    Stroke ([place])    TIA (transient ischemic attack)    Hx:of    Past Surgical History:  Procedure Laterality Date   ARTERIOVENOUS GRAFT PLACEMENT  09/11/2011   left arm   CESAREAN SECTION     CESAREAN SECTION WITH BILATERAL TUBAL LIGATION  02/29/1996   DILITATION & CURRETTAGE/HYSTROSCOPY WITH NOVASURE ABLATION N/A 05/03/2015   Procedure: DILATATION/HYSTEROSCOPY WITH NOVASURE ABLATION; uterine cavity length 5.0 cm, uterine cavity width 3.8 cm, power 105 watts; time 1 minute 12 seconds;  Surgeon: Jonnie Kind, MD;  Location: [facility];  Service: Gynecology;  Laterality: N/A;   FISTULA SUPERFICIALIZATION Left [id number]   Procedure: PLICATION OF LEFT ARM FISTULA;  Surgeon: Cherre Robins, MD;  Location: [place];  Service: Vascular;  Laterality: Left;   INSERTION OF DIALYSIS CATHETER Right 09/01/2013   Procedure: INSERTION OF DIALYSIS CATHETER Right Internal Jugular;  Surgeon: Elam Dutch, MD;  Location: [place];   Service: Vascular;  Laterality: Right;   PATCH ANGIOPLASTY Left 09/01/2013   Procedure: PATCH ANGIOPLASTY;  Surgeon: Elam Dutch, MD;  Location: [place];  Service: Vascular;  Laterality: Left;   REVISON OF ARTERIOVENOUS FISTULA Left [id number]   Procedure: Plication left arm fistula;  Surgeon: Elam Dutch, MD;  Location: [facility] OR;  Service: Vascular;  Laterality: Left;    Family History  Problem Relation Age of Onset   Hyperlipidemia Mother    Cancer - Other Mother        Hx partial hysterectomy   Heart disease Father        Hx CABG      Social History   Socioeconomic History   Marital status: Single    Spouse name: Not on file   Number of children: Not on file   Years of education: Not on file   Highest education level: Not on file  Occupational History   Not on file  Tobacco Use   Smoking status: Former    Packs/day: 0.50    Years: 3.00    Additional pack years: 0.00    Total pack years: 1.50    Types: Cigarettes    Quit date: 09/05/2009    Years since quitting: 13.4   Smokeless tobacco: Never  Vaping Use   Vaping Use: Never used  Substance and Sexual Activity   Alcohol use: No   Drug use: No   Sexual  activity: Not Currently    Partners: Male    Birth control/protection: Abstinence    Comment: declined condoms  Other Topics Concern   Not on file  Social History Narrative   Not on file   Social Determinants of Health   Financial Resource Strain: Low Risk  (03/30/2021)   Overall Financial Resource Strain (CARDIA)    Difficulty of Paying Living Expenses: Not hard at all  Food Insecurity: No Food Insecurity (03/30/2021)   Hunger Vital Sign    Worried About Running Out of Food in the Last Year: Never true    [place] in the Last Year: Never true  Transportation Needs: No Transportation Needs (03/30/2021)   PRAPARE - Hydrologist (Medical): No    Lack of Transportation (Non-Medical): No  Physical Activity: Insufficiently  Active (03/30/2021)   Exercise Vital Sign    Days of Exercise per Week: 4 days    Minutes of Exercise per Session: 30 min  Stress: Stress Concern Present (03/30/2021)   [place]    Feeling of Stress : To some extent  Social Connections: Moderately Integrated (03/30/2021)   Social Connection and Isolation Panel [NHANES]    Frequency of Communication with Friends and Family: More than three times a week    Frequency of Social Gatherings with Friends and Family: Once a week    Attends Religious Services: 1 to 4 times per year    Active Member of [employer] or Organizations: Yes    Attends Music therapist: More than 4 times per year    Marital Status: Never married    Allergies  Allergen Reactions   Fortaz [Ceftazidime Sodium In Dextrose] Rash    Head-toe   Sulfa Antibiotics Hives   Vancomycin Rash    Head-toe     Current Outpatient Medications:    B Complex-C-Folic Acid (RENA-VITE RX) 1 MG TABS, Take 1 tablet by mouth daily., Disp: , Rfl:    bictegravir-emtricitabine-tenofovir AF (BIKTARVY) 50-200-25 MG TABS tablet, TAKE 1 TABLET BY MOUTH DAILY., Disp: 30 tablet, Rfl: 0   calcium carbonate (TUMS - DOSED IN MG ELEMENTAL CALCIUM) 500 MG chewable tablet, Chew 1-2 tablets by mouth daily as needed for indigestion or heartburn., Disp: , Rfl:    diltiazem (CARDIZEM) 30 MG tablet, Take 1 tablet (30 mg total) by mouth every 6 (six) hours as needed (for elevated heart rates)., Disp: 30 tablet, Rfl: 1   hydrOXYzine (ATARAX/VISTARIL) 10 MG tablet, Take 1 tablet (10 mg total) by mouth every 8 (eight) hours as needed., Disp: 60 tablet, Rfl: 12   metoprolol tartrate (LOPRESSOR) 25 MG tablet, Take 12.5 mg by mouth 2 (two) times daily as needed (for blood pressure). Except takes none on dialysis days (Tuesday, Thursday, and Sunday)., Disp: , Rfl:    sertraline (ZOLOFT) 100 MG tablet, Take 1 tablet (100 mg total) by mouth daily.,  Disp: 30 tablet, Rfl: 5    Review of Systems  Constitutional:  Negative for activity change, appetite change, chills, diaphoresis, fatigue, fever and unexpected weight change.  HENT:  Negative for congestion, rhinorrhea, sinus pressure, sneezing, sore throat and trouble swallowing.   Eyes:  Negative for photophobia and visual disturbance.  Respiratory:  Negative for cough, chest tightness, shortness of breath, wheezing and stridor.   Cardiovascular:  Negative for chest pain, palpitations and leg swelling.  Gastrointestinal:  Negative for abdominal distention, abdominal pain, anal bleeding, blood in stool, constipation,  diarrhea, nausea and vomiting.  Genitourinary:  Negative for difficulty urinating, dysuria, flank pain and hematuria.  Musculoskeletal:  Negative for arthralgias, back pain, gait problem, joint swelling and myalgias.  Skin:  Negative for color change, pallor, rash and wound.  Neurological:  Negative for dizziness, tremors, weakness and light-headedness.  Hematological:  Negative for adenopathy. Does not bruise/bleed easily.  Psychiatric/Behavioral:  Negative for agitation, behavioral problems, confusion, decreased concentration, dysphoric mood and sleep disturbance.        Objective:   Physical Exam Vitals reviewed.  Constitutional:      Appearance: Normal appearance.  Eyes:     General:        Right eye: No discharge.        Left eye: No discharge.     Extraocular Movements: Extraocular movements intact.     Pupils: Pupils are equal, round, and reactive to light.  Pulmonary:     Effort: Pulmonary effort is normal. No respiratory distress.     Breath sounds: No wheezing.  Skin:    General: Skin is warm and dry.  Neurological:     General: No focal deficit present.     Mental Status: She is alert and oriented to person, place, and time.  Psychiatric:        Mood and Affect: Mood normal.        Behavior: Behavior normal.        Thought Content: Thought content  normal.        Judgment: Judgment normal.           Assessment & Plan:   HIV disease:  I will add order HIV viral load CD4 count CBC with differential CMP, RPR GC and chlamydia and I will continue  Janilah T Abts's Biktarvy  prescription  Based on her genosure archive results [place] would NOT be a good idea  I know that there have been many MD's in the field who have given Dovato to those with historical 184V and that such patients have done fine though there is not yet an abundance of literature on this, and this regimen would really be giving only 1 fully active drug along with the action of 184 V making the virus less fit.  JULUCA would certainly give her an option with 2 fully active drugs with dolutegravir and rilpivirine.  It would require her not to be on proton pump inhibitors antiacids and to take her medication with a meal.  1 could also consider options such as TIVICAY and a boosted protease inhibitor such as Prezcobix.  I also wonder if it might not be just fine to keep her on the BIKTARVY since TAF has not been shown to be nephrotoxic in clinical trials and almost all of the case reports I have seen have had confounders  Certainly would like to see what her Nephrologists, Transplant MDs at [facility] perfectly controlled on metoprolol  End-stage renal disease on hemodialysis nearing transplantation prolol  Hyperlipidemia: Reviewed the results of the reprieve study and need for her to be on a statin.  I also think she should have been on a statin anyway regardless given her TIA history I wanted to give her Crestor 20 but due to the fact she is on dialysis we will go with a lower dose of 5 mg with maximum dose of Crestor being 10 in this patient population  Vaccine counseling: recommended prevnar 20 which she received.

## 2023-02-06 DIAGNOSIS — Z992 Dependence on renal dialysis: Secondary | ICD-10-CM | POA: Diagnosis not present

## 2023-02-06 DIAGNOSIS — N186 End stage renal disease: Secondary | ICD-10-CM | POA: Diagnosis not present

## 2023-02-06 DIAGNOSIS — N2581 Secondary hyperparathyroidism of renal origin: Secondary | ICD-10-CM | POA: Diagnosis not present

## 2023-02-06 LAB — T-HELPER CELLS (CD4) COUNT (NOT AT ARMC)
CD4 % Helper T Cell: 38 % (ref 33–65)
CD4 T Cell Abs: 633 /uL (ref 400–1790)

## 2023-02-07 ENCOUNTER — Other Ambulatory Visit (HOSPITAL_COMMUNITY): Payer: Self-pay

## 2023-02-07 ENCOUNTER — Other Ambulatory Visit: Payer: Self-pay

## 2023-02-08 DIAGNOSIS — N2581 Secondary hyperparathyroidism of renal origin: Secondary | ICD-10-CM | POA: Diagnosis not present

## 2023-02-08 DIAGNOSIS — N186 End stage renal disease: Secondary | ICD-10-CM | POA: Diagnosis not present

## 2023-02-08 DIAGNOSIS — Z992 Dependence on renal dialysis: Secondary | ICD-10-CM | POA: Diagnosis not present

## 2023-02-08 LAB — LIPID PANEL
Cholesterol: 232 mg/dL — ABNORMAL HIGH (ref <200)
HDL: 51 mg/dL (ref >=50)
LDL Cholesterol (Calc): 156 mg/dL (calc) — ABNORMAL HIGH
Non-HDL Cholesterol (Calc): 181 mg/dL (calc) — ABNORMAL HIGH (ref <130)
Total CHOL/HDL Ratio: 4.5 (calc) (ref <5.0)
Triglycerides: 130 mg/dL (ref <150)

## 2023-02-08 LAB — CBC WITH DIFFERENTIAL/PLATELET
Absolute Monocytes: 390 cells/uL (ref 200–950)
Basophils Absolute: 78 cells/uL (ref 0–200)
Basophils Relative: 1.2 %
Eosinophils Absolute: 150 cells/uL (ref 15–500)
Eosinophils Relative: 2.3 %
HCT: 40 % (ref 35.0–45.0)
Hemoglobin: 13.6 g/dL (ref 11.7–15.5)
Lymphs Abs: 1963 cells/uL (ref 850–3900)
MCH: 33.3 pg — ABNORMAL HIGH (ref 27.0–33.0)
MCHC: 34 g/dL (ref 32.0–36.0)
MCV: 98 fL (ref 80.0–100.0)
MPV: 9.7 fL (ref 7.5–12.5)
Monocytes Relative: 6 %
Neutro Abs: 3920 cells/uL (ref 1500–7800)
Neutrophils Relative %: 60.3 %
Platelets: 258 10*3/uL (ref 140–400)
RBC: 4.08 10*6/uL (ref 3.80–5.10)
RDW: 12.7 % (ref 11.0–15.0)
Total Lymphocyte: 30.2 %
WBC: 6.5 10*3/uL (ref 3.8–10.8)

## 2023-02-08 LAB — HIV-1 RNA QUANT-NO REFLEX-BLD
HIV 1 RNA Quant: NOT DETECTED Copies/mL
HIV-1 RNA Quant, Log: NOT DETECTED Log cps/mL

## 2023-02-08 LAB — RPR: RPR Ser Ql: NONREACTIVE

## 2023-02-11 DIAGNOSIS — N186 End stage renal disease: Secondary | ICD-10-CM | POA: Diagnosis not present

## 2023-02-11 DIAGNOSIS — N2581 Secondary hyperparathyroidism of renal origin: Secondary | ICD-10-CM | POA: Diagnosis not present

## 2023-02-11 DIAGNOSIS — I120 Hypertensive chronic kidney disease with stage 5 chronic kidney disease or end stage renal disease: Secondary | ICD-10-CM | POA: Diagnosis not present

## 2023-02-11 DIAGNOSIS — D631 Anemia in chronic kidney disease: Secondary | ICD-10-CM | POA: Diagnosis not present

## 2023-02-11 DIAGNOSIS — Z992 Dependence on renal dialysis: Secondary | ICD-10-CM | POA: Diagnosis not present

## 2023-02-13 ENCOUNTER — Other Ambulatory Visit: Payer: Self-pay

## 2023-02-13 DIAGNOSIS — N186 End stage renal disease: Secondary | ICD-10-CM | POA: Diagnosis not present

## 2023-02-13 DIAGNOSIS — N2581 Secondary hyperparathyroidism of renal origin: Secondary | ICD-10-CM | POA: Diagnosis not present

## 2023-02-13 DIAGNOSIS — D631 Anemia in chronic kidney disease: Secondary | ICD-10-CM | POA: Diagnosis not present

## 2023-02-13 DIAGNOSIS — Z992 Dependence on renal dialysis: Secondary | ICD-10-CM | POA: Diagnosis not present

## 2023-02-15 DIAGNOSIS — Z992 Dependence on renal dialysis: Secondary | ICD-10-CM | POA: Diagnosis not present

## 2023-02-15 DIAGNOSIS — D631 Anemia in chronic kidney disease: Secondary | ICD-10-CM | POA: Diagnosis not present

## 2023-02-15 DIAGNOSIS — N2581 Secondary hyperparathyroidism of renal origin: Secondary | ICD-10-CM | POA: Diagnosis not present

## 2023-02-15 DIAGNOSIS — N186 End stage renal disease: Secondary | ICD-10-CM | POA: Diagnosis not present

## 2023-02-18 DIAGNOSIS — Z992 Dependence on renal dialysis: Secondary | ICD-10-CM | POA: Diagnosis not present

## 2023-02-18 DIAGNOSIS — D631 Anemia in chronic kidney disease: Secondary | ICD-10-CM | POA: Diagnosis not present

## 2023-02-18 DIAGNOSIS — N186 End stage renal disease: Secondary | ICD-10-CM | POA: Diagnosis not present

## 2023-02-18 DIAGNOSIS — N2581 Secondary hyperparathyroidism of renal origin: Secondary | ICD-10-CM | POA: Diagnosis not present

## 2023-02-20 DIAGNOSIS — Z992 Dependence on renal dialysis: Secondary | ICD-10-CM | POA: Diagnosis not present

## 2023-02-20 DIAGNOSIS — D631 Anemia in chronic kidney disease: Secondary | ICD-10-CM | POA: Diagnosis not present

## 2023-02-20 DIAGNOSIS — N186 End stage renal disease: Secondary | ICD-10-CM | POA: Diagnosis not present

## 2023-02-20 DIAGNOSIS — N2581 Secondary hyperparathyroidism of renal origin: Secondary | ICD-10-CM | POA: Diagnosis not present

## 2023-02-22 DIAGNOSIS — N186 End stage renal disease: Secondary | ICD-10-CM | POA: Diagnosis not present

## 2023-02-22 DIAGNOSIS — N2581 Secondary hyperparathyroidism of renal origin: Secondary | ICD-10-CM | POA: Diagnosis not present

## 2023-02-22 DIAGNOSIS — D631 Anemia in chronic kidney disease: Secondary | ICD-10-CM | POA: Diagnosis not present

## 2023-02-22 DIAGNOSIS — Z992 Dependence on renal dialysis: Secondary | ICD-10-CM | POA: Diagnosis not present

## 2023-02-25 DIAGNOSIS — N2581 Secondary hyperparathyroidism of renal origin: Secondary | ICD-10-CM | POA: Diagnosis not present

## 2023-02-25 DIAGNOSIS — Z992 Dependence on renal dialysis: Secondary | ICD-10-CM | POA: Diagnosis not present

## 2023-02-25 DIAGNOSIS — D631 Anemia in chronic kidney disease: Secondary | ICD-10-CM | POA: Diagnosis not present

## 2023-02-25 DIAGNOSIS — N186 End stage renal disease: Secondary | ICD-10-CM | POA: Diagnosis not present

## 2023-02-27 DIAGNOSIS — N186 End stage renal disease: Secondary | ICD-10-CM | POA: Diagnosis not present

## 2023-02-27 DIAGNOSIS — Z992 Dependence on renal dialysis: Secondary | ICD-10-CM | POA: Diagnosis not present

## 2023-02-27 DIAGNOSIS — D631 Anemia in chronic kidney disease: Secondary | ICD-10-CM | POA: Diagnosis not present

## 2023-02-27 DIAGNOSIS — N2581 Secondary hyperparathyroidism of renal origin: Secondary | ICD-10-CM | POA: Diagnosis not present

## 2023-02-28 DIAGNOSIS — Z992 Dependence on renal dialysis: Secondary | ICD-10-CM | POA: Diagnosis not present

## 2023-02-28 DIAGNOSIS — N2581 Secondary hyperparathyroidism of renal origin: Secondary | ICD-10-CM | POA: Diagnosis not present

## 2023-02-28 DIAGNOSIS — D631 Anemia in chronic kidney disease: Secondary | ICD-10-CM | POA: Diagnosis not present

## 2023-02-28 DIAGNOSIS — N186 End stage renal disease: Secondary | ICD-10-CM | POA: Diagnosis not present

## 2023-03-04 DIAGNOSIS — N186 End stage renal disease: Secondary | ICD-10-CM | POA: Diagnosis not present

## 2023-03-04 DIAGNOSIS — Z992 Dependence on renal dialysis: Secondary | ICD-10-CM | POA: Diagnosis not present

## 2023-03-04 DIAGNOSIS — N2581 Secondary hyperparathyroidism of renal origin: Secondary | ICD-10-CM | POA: Diagnosis not present

## 2023-03-04 DIAGNOSIS — D631 Anemia in chronic kidney disease: Secondary | ICD-10-CM | POA: Diagnosis not present

## 2023-03-06 DIAGNOSIS — N186 End stage renal disease: Secondary | ICD-10-CM | POA: Diagnosis not present

## 2023-03-06 DIAGNOSIS — Z992 Dependence on renal dialysis: Secondary | ICD-10-CM | POA: Diagnosis not present

## 2023-03-06 DIAGNOSIS — D631 Anemia in chronic kidney disease: Secondary | ICD-10-CM | POA: Diagnosis not present

## 2023-03-06 DIAGNOSIS — N2581 Secondary hyperparathyroidism of renal origin: Secondary | ICD-10-CM | POA: Diagnosis not present

## 2023-03-08 DIAGNOSIS — D631 Anemia in chronic kidney disease: Secondary | ICD-10-CM | POA: Diagnosis not present

## 2023-03-08 DIAGNOSIS — Z992 Dependence on renal dialysis: Secondary | ICD-10-CM | POA: Diagnosis not present

## 2023-03-08 DIAGNOSIS — N186 End stage renal disease: Secondary | ICD-10-CM | POA: Diagnosis not present

## 2023-03-08 DIAGNOSIS — N2581 Secondary hyperparathyroidism of renal origin: Secondary | ICD-10-CM | POA: Diagnosis not present

## 2023-03-11 DIAGNOSIS — N186 End stage renal disease: Secondary | ICD-10-CM | POA: Diagnosis not present

## 2023-03-11 DIAGNOSIS — D631 Anemia in chronic kidney disease: Secondary | ICD-10-CM | POA: Diagnosis not present

## 2023-03-11 DIAGNOSIS — N2581 Secondary hyperparathyroidism of renal origin: Secondary | ICD-10-CM | POA: Diagnosis not present

## 2023-03-11 DIAGNOSIS — Z992 Dependence on renal dialysis: Secondary | ICD-10-CM | POA: Diagnosis not present

## 2023-03-12 ENCOUNTER — Other Ambulatory Visit: Payer: Self-pay

## 2023-03-12 ENCOUNTER — Other Ambulatory Visit: Payer: Self-pay | Admitting: Infectious Disease

## 2023-03-12 ENCOUNTER — Other Ambulatory Visit (HOSPITAL_COMMUNITY): Payer: Self-pay

## 2023-03-12 DIAGNOSIS — B20 Human immunodeficiency virus [HIV] disease: Secondary | ICD-10-CM

## 2023-03-12 MED ORDER — BIKTARVY 50-200-25 MG PO TABS
1.0000 | ORAL_TABLET | Freq: Every day | ORAL | 5 refills | Status: DC
  Filled 2023-03-12: qty 30, 30d supply, fill #0
  Filled 2023-04-17: qty 30, 30d supply, fill #1
  Filled 2023-05-15: qty 30, 30d supply, fill #2
  Filled 2023-06-11: qty 30, 30d supply, fill #3
  Filled 2023-07-10: qty 30, 30d supply, fill #4
  Filled 2023-08-05: qty 30, 30d supply, fill #5

## 2023-03-12 NOTE — Telephone Encounter (Signed)
Are switching her medication or continuing the [facility]

## 2023-03-13 ENCOUNTER — Other Ambulatory Visit (HOSPITAL_COMMUNITY): Payer: Self-pay

## 2023-03-13 DIAGNOSIS — D631 Anemia in chronic kidney disease: Secondary | ICD-10-CM | POA: Diagnosis not present

## 2023-03-13 DIAGNOSIS — N186 End stage renal disease: Secondary | ICD-10-CM | POA: Diagnosis not present

## 2023-03-13 DIAGNOSIS — I120 Hypertensive chronic kidney disease with stage 5 chronic kidney disease or end stage renal disease: Secondary | ICD-10-CM | POA: Diagnosis not present

## 2023-03-13 DIAGNOSIS — N2581 Secondary hyperparathyroidism of renal origin: Secondary | ICD-10-CM | POA: Diagnosis not present

## 2023-03-13 DIAGNOSIS — Z992 Dependence on renal dialysis: Secondary | ICD-10-CM | POA: Diagnosis not present

## 2023-03-15 ENCOUNTER — Other Ambulatory Visit (HOSPITAL_COMMUNITY): Payer: Self-pay

## 2023-03-15 DIAGNOSIS — Z992 Dependence on renal dialysis: Secondary | ICD-10-CM | POA: Diagnosis not present

## 2023-03-15 DIAGNOSIS — D631 Anemia in chronic kidney disease: Secondary | ICD-10-CM | POA: Diagnosis not present

## 2023-03-15 DIAGNOSIS — N2581 Secondary hyperparathyroidism of renal origin: Secondary | ICD-10-CM | POA: Diagnosis not present

## 2023-03-15 DIAGNOSIS — N186 End stage renal disease: Secondary | ICD-10-CM | POA: Diagnosis not present

## 2023-03-18 DIAGNOSIS — N186 End stage renal disease: Secondary | ICD-10-CM | POA: Diagnosis not present

## 2023-03-18 DIAGNOSIS — Z992 Dependence on renal dialysis: Secondary | ICD-10-CM | POA: Diagnosis not present

## 2023-03-18 DIAGNOSIS — N2581 Secondary hyperparathyroidism of renal origin: Secondary | ICD-10-CM | POA: Diagnosis not present

## 2023-03-18 DIAGNOSIS — D631 Anemia in chronic kidney disease: Secondary | ICD-10-CM | POA: Diagnosis not present

## 2023-03-20 DIAGNOSIS — Z992 Dependence on renal dialysis: Secondary | ICD-10-CM | POA: Diagnosis not present

## 2023-03-20 DIAGNOSIS — N2581 Secondary hyperparathyroidism of renal origin: Secondary | ICD-10-CM | POA: Diagnosis not present

## 2023-03-20 DIAGNOSIS — N186 End stage renal disease: Secondary | ICD-10-CM | POA: Diagnosis not present

## 2023-03-20 DIAGNOSIS — D631 Anemia in chronic kidney disease: Secondary | ICD-10-CM | POA: Diagnosis not present

## 2023-03-22 DIAGNOSIS — N2581 Secondary hyperparathyroidism of renal origin: Secondary | ICD-10-CM | POA: Diagnosis not present

## 2023-03-22 DIAGNOSIS — N186 End stage renal disease: Secondary | ICD-10-CM | POA: Diagnosis not present

## 2023-03-22 DIAGNOSIS — D631 Anemia in chronic kidney disease: Secondary | ICD-10-CM | POA: Diagnosis not present

## 2023-03-22 DIAGNOSIS — Z992 Dependence on renal dialysis: Secondary | ICD-10-CM | POA: Diagnosis not present

## 2023-03-25 DIAGNOSIS — D631 Anemia in chronic kidney disease: Secondary | ICD-10-CM | POA: Diagnosis not present

## 2023-03-25 DIAGNOSIS — N186 End stage renal disease: Secondary | ICD-10-CM | POA: Diagnosis not present

## 2023-03-25 DIAGNOSIS — N2581 Secondary hyperparathyroidism of renal origin: Secondary | ICD-10-CM | POA: Diagnosis not present

## 2023-03-25 DIAGNOSIS — Z992 Dependence on renal dialysis: Secondary | ICD-10-CM | POA: Diagnosis not present

## 2023-03-27 DIAGNOSIS — D631 Anemia in chronic kidney disease: Secondary | ICD-10-CM | POA: Diagnosis not present

## 2023-03-27 DIAGNOSIS — N2581 Secondary hyperparathyroidism of renal origin: Secondary | ICD-10-CM | POA: Diagnosis not present

## 2023-03-27 DIAGNOSIS — N186 End stage renal disease: Secondary | ICD-10-CM | POA: Diagnosis not present

## 2023-03-27 DIAGNOSIS — Z992 Dependence on renal dialysis: Secondary | ICD-10-CM | POA: Diagnosis not present

## 2023-03-28 DIAGNOSIS — D631 Anemia in chronic kidney disease: Secondary | ICD-10-CM | POA: Diagnosis not present

## 2023-03-28 DIAGNOSIS — Z992 Dependence on renal dialysis: Secondary | ICD-10-CM | POA: Diagnosis not present

## 2023-03-28 DIAGNOSIS — N2581 Secondary hyperparathyroidism of renal origin: Secondary | ICD-10-CM | POA: Diagnosis not present

## 2023-03-28 DIAGNOSIS — N186 End stage renal disease: Secondary | ICD-10-CM | POA: Diagnosis not present

## 2023-04-01 DIAGNOSIS — D631 Anemia in chronic kidney disease: Secondary | ICD-10-CM | POA: Diagnosis not present

## 2023-04-01 DIAGNOSIS — N186 End stage renal disease: Secondary | ICD-10-CM | POA: Diagnosis not present

## 2023-04-01 DIAGNOSIS — Z992 Dependence on renal dialysis: Secondary | ICD-10-CM | POA: Diagnosis not present

## 2023-04-01 DIAGNOSIS — N2581 Secondary hyperparathyroidism of renal origin: Secondary | ICD-10-CM | POA: Diagnosis not present

## 2023-04-03 DIAGNOSIS — D631 Anemia in chronic kidney disease: Secondary | ICD-10-CM | POA: Diagnosis not present

## 2023-04-03 DIAGNOSIS — N2581 Secondary hyperparathyroidism of renal origin: Secondary | ICD-10-CM | POA: Diagnosis not present

## 2023-04-03 DIAGNOSIS — Z992 Dependence on renal dialysis: Secondary | ICD-10-CM | POA: Diagnosis not present

## 2023-04-03 DIAGNOSIS — N186 End stage renal disease: Secondary | ICD-10-CM | POA: Diagnosis not present

## 2023-04-05 DIAGNOSIS — D631 Anemia in chronic kidney disease: Secondary | ICD-10-CM | POA: Diagnosis not present

## 2023-04-05 DIAGNOSIS — N186 End stage renal disease: Secondary | ICD-10-CM | POA: Diagnosis not present

## 2023-04-05 DIAGNOSIS — Z992 Dependence on renal dialysis: Secondary | ICD-10-CM | POA: Diagnosis not present

## 2023-04-05 DIAGNOSIS — N2581 Secondary hyperparathyroidism of renal origin: Secondary | ICD-10-CM | POA: Diagnosis not present

## 2023-04-08 DIAGNOSIS — N186 End stage renal disease: Secondary | ICD-10-CM | POA: Diagnosis not present

## 2023-04-08 DIAGNOSIS — D631 Anemia in chronic kidney disease: Secondary | ICD-10-CM | POA: Diagnosis not present

## 2023-04-08 DIAGNOSIS — Z992 Dependence on renal dialysis: Secondary | ICD-10-CM | POA: Diagnosis not present

## 2023-04-08 DIAGNOSIS — N2581 Secondary hyperparathyroidism of renal origin: Secondary | ICD-10-CM | POA: Diagnosis not present

## 2023-04-09 ENCOUNTER — Other Ambulatory Visit (HOSPITAL_COMMUNITY): Payer: Self-pay

## 2023-04-10 DIAGNOSIS — N2581 Secondary hyperparathyroidism of renal origin: Secondary | ICD-10-CM | POA: Diagnosis not present

## 2023-04-10 DIAGNOSIS — Z992 Dependence on renal dialysis: Secondary | ICD-10-CM | POA: Diagnosis not present

## 2023-04-10 DIAGNOSIS — D631 Anemia in chronic kidney disease: Secondary | ICD-10-CM | POA: Diagnosis not present

## 2023-04-10 DIAGNOSIS — N186 End stage renal disease: Secondary | ICD-10-CM | POA: Diagnosis not present

## 2023-04-12 ENCOUNTER — Other Ambulatory Visit (HOSPITAL_COMMUNITY): Payer: Self-pay

## 2023-04-12 DIAGNOSIS — N186 End stage renal disease: Secondary | ICD-10-CM | POA: Diagnosis not present

## 2023-04-12 DIAGNOSIS — Z992 Dependence on renal dialysis: Secondary | ICD-10-CM | POA: Diagnosis not present

## 2023-04-12 DIAGNOSIS — D631 Anemia in chronic kidney disease: Secondary | ICD-10-CM | POA: Diagnosis not present

## 2023-04-12 DIAGNOSIS — N2581 Secondary hyperparathyroidism of renal origin: Secondary | ICD-10-CM | POA: Diagnosis not present

## 2023-04-13 DIAGNOSIS — I120 Hypertensive chronic kidney disease with stage 5 chronic kidney disease or end stage renal disease: Secondary | ICD-10-CM | POA: Diagnosis not present

## 2023-04-13 DIAGNOSIS — Z992 Dependence on renal dialysis: Secondary | ICD-10-CM | POA: Diagnosis not present

## 2023-04-13 DIAGNOSIS — N186 End stage renal disease: Secondary | ICD-10-CM | POA: Diagnosis not present

## 2023-04-15 ENCOUNTER — Encounter (HOSPITAL_COMMUNITY): Payer: Self-pay

## 2023-04-15 ENCOUNTER — Other Ambulatory Visit (HOSPITAL_COMMUNITY): Payer: Self-pay

## 2023-04-15 DIAGNOSIS — N2581 Secondary hyperparathyroidism of renal origin: Secondary | ICD-10-CM | POA: Diagnosis not present

## 2023-04-15 DIAGNOSIS — N186 End stage renal disease: Secondary | ICD-10-CM | POA: Diagnosis not present

## 2023-04-15 DIAGNOSIS — Z992 Dependence on renal dialysis: Secondary | ICD-10-CM | POA: Diagnosis not present

## 2023-04-15 DIAGNOSIS — D631 Anemia in chronic kidney disease: Secondary | ICD-10-CM | POA: Diagnosis not present

## 2023-04-17 ENCOUNTER — Other Ambulatory Visit (HOSPITAL_COMMUNITY): Payer: Self-pay

## 2023-04-17 DIAGNOSIS — D631 Anemia in chronic kidney disease: Secondary | ICD-10-CM | POA: Diagnosis not present

## 2023-04-17 DIAGNOSIS — Z992 Dependence on renal dialysis: Secondary | ICD-10-CM | POA: Diagnosis not present

## 2023-04-17 DIAGNOSIS — N186 End stage renal disease: Secondary | ICD-10-CM | POA: Diagnosis not present

## 2023-04-17 DIAGNOSIS — N2581 Secondary hyperparathyroidism of renal origin: Secondary | ICD-10-CM | POA: Diagnosis not present

## 2023-04-18 ENCOUNTER — Other Ambulatory Visit (HOSPITAL_COMMUNITY): Payer: Self-pay

## 2023-04-19 DIAGNOSIS — D631 Anemia in chronic kidney disease: Secondary | ICD-10-CM | POA: Diagnosis not present

## 2023-04-19 DIAGNOSIS — N2581 Secondary hyperparathyroidism of renal origin: Secondary | ICD-10-CM | POA: Diagnosis not present

## 2023-04-19 DIAGNOSIS — N186 End stage renal disease: Secondary | ICD-10-CM | POA: Diagnosis not present

## 2023-04-19 DIAGNOSIS — Z992 Dependence on renal dialysis: Secondary | ICD-10-CM | POA: Diagnosis not present

## 2023-04-22 DIAGNOSIS — N186 End stage renal disease: Secondary | ICD-10-CM | POA: Diagnosis not present

## 2023-04-22 DIAGNOSIS — Z992 Dependence on renal dialysis: Secondary | ICD-10-CM | POA: Diagnosis not present

## 2023-04-22 DIAGNOSIS — D631 Anemia in chronic kidney disease: Secondary | ICD-10-CM | POA: Diagnosis not present

## 2023-04-22 DIAGNOSIS — N2581 Secondary hyperparathyroidism of renal origin: Secondary | ICD-10-CM | POA: Diagnosis not present

## 2023-04-24 DIAGNOSIS — N2581 Secondary hyperparathyroidism of renal origin: Secondary | ICD-10-CM | POA: Diagnosis not present

## 2023-04-24 DIAGNOSIS — D631 Anemia in chronic kidney disease: Secondary | ICD-10-CM | POA: Diagnosis not present

## 2023-04-24 DIAGNOSIS — Z992 Dependence on renal dialysis: Secondary | ICD-10-CM | POA: Diagnosis not present

## 2023-04-24 DIAGNOSIS — N186 End stage renal disease: Secondary | ICD-10-CM | POA: Diagnosis not present

## 2023-04-26 DIAGNOSIS — N186 End stage renal disease: Secondary | ICD-10-CM | POA: Diagnosis not present

## 2023-04-26 DIAGNOSIS — N2581 Secondary hyperparathyroidism of renal origin: Secondary | ICD-10-CM | POA: Diagnosis not present

## 2023-04-26 DIAGNOSIS — Z992 Dependence on renal dialysis: Secondary | ICD-10-CM | POA: Diagnosis not present

## 2023-04-26 DIAGNOSIS — D631 Anemia in chronic kidney disease: Secondary | ICD-10-CM | POA: Diagnosis not present

## 2023-04-29 DIAGNOSIS — N186 End stage renal disease: Secondary | ICD-10-CM | POA: Diagnosis not present

## 2023-04-29 DIAGNOSIS — N2581 Secondary hyperparathyroidism of renal origin: Secondary | ICD-10-CM | POA: Diagnosis not present

## 2023-04-29 DIAGNOSIS — Z992 Dependence on renal dialysis: Secondary | ICD-10-CM | POA: Diagnosis not present

## 2023-04-29 DIAGNOSIS — D631 Anemia in chronic kidney disease: Secondary | ICD-10-CM | POA: Diagnosis not present

## 2023-05-01 DIAGNOSIS — Z992 Dependence on renal dialysis: Secondary | ICD-10-CM | POA: Diagnosis not present

## 2023-05-01 DIAGNOSIS — N186 End stage renal disease: Secondary | ICD-10-CM | POA: Diagnosis not present

## 2023-05-01 DIAGNOSIS — N2581 Secondary hyperparathyroidism of renal origin: Secondary | ICD-10-CM | POA: Diagnosis not present

## 2023-05-01 DIAGNOSIS — D631 Anemia in chronic kidney disease: Secondary | ICD-10-CM | POA: Diagnosis not present

## 2023-05-03 DIAGNOSIS — D631 Anemia in chronic kidney disease: Secondary | ICD-10-CM | POA: Diagnosis not present

## 2023-05-03 DIAGNOSIS — N2581 Secondary hyperparathyroidism of renal origin: Secondary | ICD-10-CM | POA: Diagnosis not present

## 2023-05-03 DIAGNOSIS — Z992 Dependence on renal dialysis: Secondary | ICD-10-CM | POA: Diagnosis not present

## 2023-05-03 DIAGNOSIS — N186 End stage renal disease: Secondary | ICD-10-CM | POA: Diagnosis not present

## 2023-05-06 DIAGNOSIS — N2581 Secondary hyperparathyroidism of renal origin: Secondary | ICD-10-CM | POA: Diagnosis not present

## 2023-05-06 DIAGNOSIS — D631 Anemia in chronic kidney disease: Secondary | ICD-10-CM | POA: Diagnosis not present

## 2023-05-06 DIAGNOSIS — Z992 Dependence on renal dialysis: Secondary | ICD-10-CM | POA: Diagnosis not present

## 2023-05-06 DIAGNOSIS — N186 End stage renal disease: Secondary | ICD-10-CM | POA: Diagnosis not present

## 2023-05-08 DIAGNOSIS — Z992 Dependence on renal dialysis: Secondary | ICD-10-CM | POA: Diagnosis not present

## 2023-05-08 DIAGNOSIS — D631 Anemia in chronic kidney disease: Secondary | ICD-10-CM | POA: Diagnosis not present

## 2023-05-08 DIAGNOSIS — N186 End stage renal disease: Secondary | ICD-10-CM | POA: Diagnosis not present

## 2023-05-08 DIAGNOSIS — N2581 Secondary hyperparathyroidism of renal origin: Secondary | ICD-10-CM | POA: Diagnosis not present

## 2023-05-10 DIAGNOSIS — N186 End stage renal disease: Secondary | ICD-10-CM | POA: Diagnosis not present

## 2023-05-10 DIAGNOSIS — D631 Anemia in chronic kidney disease: Secondary | ICD-10-CM | POA: Diagnosis not present

## 2023-05-10 DIAGNOSIS — Z992 Dependence on renal dialysis: Secondary | ICD-10-CM | POA: Diagnosis not present

## 2023-05-10 DIAGNOSIS — N2581 Secondary hyperparathyroidism of renal origin: Secondary | ICD-10-CM | POA: Diagnosis not present

## 2023-05-13 DIAGNOSIS — D631 Anemia in chronic kidney disease: Secondary | ICD-10-CM | POA: Diagnosis not present

## 2023-05-13 DIAGNOSIS — I120 Hypertensive chronic kidney disease with stage 5 chronic kidney disease or end stage renal disease: Secondary | ICD-10-CM | POA: Diagnosis not present

## 2023-05-13 DIAGNOSIS — Z992 Dependence on renal dialysis: Secondary | ICD-10-CM | POA: Diagnosis not present

## 2023-05-13 DIAGNOSIS — N2581 Secondary hyperparathyroidism of renal origin: Secondary | ICD-10-CM | POA: Diagnosis not present

## 2023-05-13 DIAGNOSIS — N186 End stage renal disease: Secondary | ICD-10-CM | POA: Diagnosis not present

## 2023-05-15 ENCOUNTER — Other Ambulatory Visit (HOSPITAL_COMMUNITY): Payer: Self-pay

## 2023-05-15 DIAGNOSIS — N186 End stage renal disease: Secondary | ICD-10-CM | POA: Diagnosis not present

## 2023-05-15 DIAGNOSIS — Z992 Dependence on renal dialysis: Secondary | ICD-10-CM | POA: Diagnosis not present

## 2023-05-15 DIAGNOSIS — D631 Anemia in chronic kidney disease: Secondary | ICD-10-CM | POA: Diagnosis not present

## 2023-05-15 DIAGNOSIS — N2581 Secondary hyperparathyroidism of renal origin: Secondary | ICD-10-CM | POA: Diagnosis not present

## 2023-05-17 ENCOUNTER — Other Ambulatory Visit: Payer: Self-pay

## 2023-05-17 ENCOUNTER — Other Ambulatory Visit (HOSPITAL_COMMUNITY): Payer: Self-pay

## 2023-05-17 DIAGNOSIS — Z992 Dependence on renal dialysis: Secondary | ICD-10-CM | POA: Diagnosis not present

## 2023-05-17 DIAGNOSIS — N186 End stage renal disease: Secondary | ICD-10-CM | POA: Diagnosis not present

## 2023-05-17 DIAGNOSIS — N2581 Secondary hyperparathyroidism of renal origin: Secondary | ICD-10-CM | POA: Diagnosis not present

## 2023-05-17 DIAGNOSIS — D631 Anemia in chronic kidney disease: Secondary | ICD-10-CM | POA: Diagnosis not present

## 2023-05-20 DIAGNOSIS — N2581 Secondary hyperparathyroidism of renal origin: Secondary | ICD-10-CM | POA: Diagnosis not present

## 2023-05-20 DIAGNOSIS — N186 End stage renal disease: Secondary | ICD-10-CM | POA: Diagnosis not present

## 2023-05-20 DIAGNOSIS — Z992 Dependence on renal dialysis: Secondary | ICD-10-CM | POA: Diagnosis not present

## 2023-05-20 DIAGNOSIS — D631 Anemia in chronic kidney disease: Secondary | ICD-10-CM | POA: Diagnosis not present

## 2023-05-22 DIAGNOSIS — D631 Anemia in chronic kidney disease: Secondary | ICD-10-CM | POA: Diagnosis not present

## 2023-05-22 DIAGNOSIS — Z992 Dependence on renal dialysis: Secondary | ICD-10-CM | POA: Diagnosis not present

## 2023-05-22 DIAGNOSIS — N186 End stage renal disease: Secondary | ICD-10-CM | POA: Diagnosis not present

## 2023-05-22 DIAGNOSIS — N2581 Secondary hyperparathyroidism of renal origin: Secondary | ICD-10-CM | POA: Diagnosis not present

## 2023-05-24 DIAGNOSIS — D631 Anemia in chronic kidney disease: Secondary | ICD-10-CM | POA: Diagnosis not present

## 2023-05-24 DIAGNOSIS — N186 End stage renal disease: Secondary | ICD-10-CM | POA: Diagnosis not present

## 2023-05-24 DIAGNOSIS — Z992 Dependence on renal dialysis: Secondary | ICD-10-CM | POA: Diagnosis not present

## 2023-05-24 DIAGNOSIS — N2581 Secondary hyperparathyroidism of renal origin: Secondary | ICD-10-CM | POA: Diagnosis not present

## 2023-05-27 DIAGNOSIS — N2581 Secondary hyperparathyroidism of renal origin: Secondary | ICD-10-CM | POA: Diagnosis not present

## 2023-05-27 DIAGNOSIS — D631 Anemia in chronic kidney disease: Secondary | ICD-10-CM | POA: Diagnosis not present

## 2023-05-27 DIAGNOSIS — N186 End stage renal disease: Secondary | ICD-10-CM | POA: Diagnosis not present

## 2023-05-27 DIAGNOSIS — Z992 Dependence on renal dialysis: Secondary | ICD-10-CM | POA: Diagnosis not present

## 2023-05-29 DIAGNOSIS — D631 Anemia in chronic kidney disease: Secondary | ICD-10-CM | POA: Diagnosis not present

## 2023-05-29 DIAGNOSIS — N2581 Secondary hyperparathyroidism of renal origin: Secondary | ICD-10-CM | POA: Diagnosis not present

## 2023-05-29 DIAGNOSIS — N186 End stage renal disease: Secondary | ICD-10-CM | POA: Diagnosis not present

## 2023-05-29 DIAGNOSIS — Z992 Dependence on renal dialysis: Secondary | ICD-10-CM | POA: Diagnosis not present

## 2023-05-31 DIAGNOSIS — Z992 Dependence on renal dialysis: Secondary | ICD-10-CM | POA: Diagnosis not present

## 2023-05-31 DIAGNOSIS — D631 Anemia in chronic kidney disease: Secondary | ICD-10-CM | POA: Diagnosis not present

## 2023-05-31 DIAGNOSIS — N2581 Secondary hyperparathyroidism of renal origin: Secondary | ICD-10-CM | POA: Diagnosis not present

## 2023-05-31 DIAGNOSIS — N186 End stage renal disease: Secondary | ICD-10-CM | POA: Diagnosis not present

## 2023-06-03 DIAGNOSIS — N186 End stage renal disease: Secondary | ICD-10-CM | POA: Diagnosis not present

## 2023-06-03 DIAGNOSIS — D631 Anemia in chronic kidney disease: Secondary | ICD-10-CM | POA: Diagnosis not present

## 2023-06-03 DIAGNOSIS — N2581 Secondary hyperparathyroidism of renal origin: Secondary | ICD-10-CM | POA: Diagnosis not present

## 2023-06-03 DIAGNOSIS — Z992 Dependence on renal dialysis: Secondary | ICD-10-CM | POA: Diagnosis not present

## 2023-06-05 DIAGNOSIS — Z992 Dependence on renal dialysis: Secondary | ICD-10-CM | POA: Diagnosis not present

## 2023-06-05 DIAGNOSIS — D631 Anemia in chronic kidney disease: Secondary | ICD-10-CM | POA: Diagnosis not present

## 2023-06-05 DIAGNOSIS — N2581 Secondary hyperparathyroidism of renal origin: Secondary | ICD-10-CM | POA: Diagnosis not present

## 2023-06-05 DIAGNOSIS — N186 End stage renal disease: Secondary | ICD-10-CM | POA: Diagnosis not present

## 2023-06-07 DIAGNOSIS — Z992 Dependence on renal dialysis: Secondary | ICD-10-CM | POA: Diagnosis not present

## 2023-06-07 DIAGNOSIS — N2581 Secondary hyperparathyroidism of renal origin: Secondary | ICD-10-CM | POA: Diagnosis not present

## 2023-06-07 DIAGNOSIS — D631 Anemia in chronic kidney disease: Secondary | ICD-10-CM | POA: Diagnosis not present

## 2023-06-07 DIAGNOSIS — N186 End stage renal disease: Secondary | ICD-10-CM | POA: Diagnosis not present

## 2023-06-10 DIAGNOSIS — Z992 Dependence on renal dialysis: Secondary | ICD-10-CM | POA: Diagnosis not present

## 2023-06-10 DIAGNOSIS — D631 Anemia in chronic kidney disease: Secondary | ICD-10-CM | POA: Diagnosis not present

## 2023-06-10 DIAGNOSIS — N186 End stage renal disease: Secondary | ICD-10-CM | POA: Diagnosis not present

## 2023-06-10 DIAGNOSIS — N2581 Secondary hyperparathyroidism of renal origin: Secondary | ICD-10-CM | POA: Diagnosis not present

## 2023-06-11 ENCOUNTER — Other Ambulatory Visit (HOSPITAL_COMMUNITY): Payer: Self-pay

## 2023-06-12 DIAGNOSIS — N2581 Secondary hyperparathyroidism of renal origin: Secondary | ICD-10-CM | POA: Diagnosis not present

## 2023-06-12 DIAGNOSIS — N186 End stage renal disease: Secondary | ICD-10-CM | POA: Diagnosis not present

## 2023-06-12 DIAGNOSIS — D631 Anemia in chronic kidney disease: Secondary | ICD-10-CM | POA: Diagnosis not present

## 2023-06-12 DIAGNOSIS — Z992 Dependence on renal dialysis: Secondary | ICD-10-CM | POA: Diagnosis not present

## 2023-06-13 DIAGNOSIS — Z992 Dependence on renal dialysis: Secondary | ICD-10-CM | POA: Diagnosis not present

## 2023-06-13 DIAGNOSIS — N186 End stage renal disease: Secondary | ICD-10-CM | POA: Diagnosis not present

## 2023-06-13 DIAGNOSIS — I120 Hypertensive chronic kidney disease with stage 5 chronic kidney disease or end stage renal disease: Secondary | ICD-10-CM | POA: Diagnosis not present

## 2023-06-14 ENCOUNTER — Other Ambulatory Visit: Payer: Self-pay

## 2023-06-14 DIAGNOSIS — N186 End stage renal disease: Secondary | ICD-10-CM | POA: Diagnosis not present

## 2023-06-14 DIAGNOSIS — Z992 Dependence on renal dialysis: Secondary | ICD-10-CM | POA: Diagnosis not present

## 2023-06-14 DIAGNOSIS — N2581 Secondary hyperparathyroidism of renal origin: Secondary | ICD-10-CM | POA: Diagnosis not present

## 2023-06-14 DIAGNOSIS — D631 Anemia in chronic kidney disease: Secondary | ICD-10-CM | POA: Diagnosis not present

## 2023-06-17 DIAGNOSIS — D631 Anemia in chronic kidney disease: Secondary | ICD-10-CM | POA: Diagnosis not present

## 2023-06-17 DIAGNOSIS — N2581 Secondary hyperparathyroidism of renal origin: Secondary | ICD-10-CM | POA: Diagnosis not present

## 2023-06-17 DIAGNOSIS — N186 End stage renal disease: Secondary | ICD-10-CM | POA: Diagnosis not present

## 2023-06-17 DIAGNOSIS — Z992 Dependence on renal dialysis: Secondary | ICD-10-CM | POA: Diagnosis not present

## 2023-06-19 DIAGNOSIS — D631 Anemia in chronic kidney disease: Secondary | ICD-10-CM | POA: Diagnosis not present

## 2023-06-19 DIAGNOSIS — N186 End stage renal disease: Secondary | ICD-10-CM | POA: Diagnosis not present

## 2023-06-19 DIAGNOSIS — N2581 Secondary hyperparathyroidism of renal origin: Secondary | ICD-10-CM | POA: Diagnosis not present

## 2023-06-19 DIAGNOSIS — Z992 Dependence on renal dialysis: Secondary | ICD-10-CM | POA: Diagnosis not present

## 2023-06-21 DIAGNOSIS — N186 End stage renal disease: Secondary | ICD-10-CM | POA: Diagnosis not present

## 2023-06-21 DIAGNOSIS — Z992 Dependence on renal dialysis: Secondary | ICD-10-CM | POA: Diagnosis not present

## 2023-06-21 DIAGNOSIS — D631 Anemia in chronic kidney disease: Secondary | ICD-10-CM | POA: Diagnosis not present

## 2023-06-21 DIAGNOSIS — N2581 Secondary hyperparathyroidism of renal origin: Secondary | ICD-10-CM | POA: Diagnosis not present

## 2023-06-24 DIAGNOSIS — N2581 Secondary hyperparathyroidism of renal origin: Secondary | ICD-10-CM | POA: Diagnosis not present

## 2023-06-24 DIAGNOSIS — D631 Anemia in chronic kidney disease: Secondary | ICD-10-CM | POA: Diagnosis not present

## 2023-06-24 DIAGNOSIS — N186 End stage renal disease: Secondary | ICD-10-CM | POA: Diagnosis not present

## 2023-06-24 DIAGNOSIS — Z992 Dependence on renal dialysis: Secondary | ICD-10-CM | POA: Diagnosis not present

## 2023-06-26 DIAGNOSIS — D631 Anemia in chronic kidney disease: Secondary | ICD-10-CM | POA: Diagnosis not present

## 2023-06-26 DIAGNOSIS — N2581 Secondary hyperparathyroidism of renal origin: Secondary | ICD-10-CM | POA: Diagnosis not present

## 2023-06-26 DIAGNOSIS — N186 End stage renal disease: Secondary | ICD-10-CM | POA: Diagnosis not present

## 2023-06-26 DIAGNOSIS — Z992 Dependence on renal dialysis: Secondary | ICD-10-CM | POA: Diagnosis not present

## 2023-06-28 DIAGNOSIS — N186 End stage renal disease: Secondary | ICD-10-CM | POA: Diagnosis not present

## 2023-06-28 DIAGNOSIS — D631 Anemia in chronic kidney disease: Secondary | ICD-10-CM | POA: Diagnosis not present

## 2023-06-28 DIAGNOSIS — N2581 Secondary hyperparathyroidism of renal origin: Secondary | ICD-10-CM | POA: Diagnosis not present

## 2023-06-28 DIAGNOSIS — Z992 Dependence on renal dialysis: Secondary | ICD-10-CM | POA: Diagnosis not present

## 2023-07-01 DIAGNOSIS — N2581 Secondary hyperparathyroidism of renal origin: Secondary | ICD-10-CM | POA: Diagnosis not present

## 2023-07-01 DIAGNOSIS — Z992 Dependence on renal dialysis: Secondary | ICD-10-CM | POA: Diagnosis not present

## 2023-07-01 DIAGNOSIS — N186 End stage renal disease: Secondary | ICD-10-CM | POA: Diagnosis not present

## 2023-07-01 DIAGNOSIS — D631 Anemia in chronic kidney disease: Secondary | ICD-10-CM | POA: Diagnosis not present

## 2023-07-03 DIAGNOSIS — N186 End stage renal disease: Secondary | ICD-10-CM | POA: Diagnosis not present

## 2023-07-03 DIAGNOSIS — N2581 Secondary hyperparathyroidism of renal origin: Secondary | ICD-10-CM | POA: Diagnosis not present

## 2023-07-03 DIAGNOSIS — D631 Anemia in chronic kidney disease: Secondary | ICD-10-CM | POA: Diagnosis not present

## 2023-07-03 DIAGNOSIS — Z992 Dependence on renal dialysis: Secondary | ICD-10-CM | POA: Diagnosis not present

## 2023-07-05 DIAGNOSIS — Z992 Dependence on renal dialysis: Secondary | ICD-10-CM | POA: Diagnosis not present

## 2023-07-05 DIAGNOSIS — N186 End stage renal disease: Secondary | ICD-10-CM | POA: Diagnosis not present

## 2023-07-05 DIAGNOSIS — N2581 Secondary hyperparathyroidism of renal origin: Secondary | ICD-10-CM | POA: Diagnosis not present

## 2023-07-05 DIAGNOSIS — D631 Anemia in chronic kidney disease: Secondary | ICD-10-CM | POA: Diagnosis not present

## 2023-07-08 ENCOUNTER — Other Ambulatory Visit: Payer: Self-pay

## 2023-07-08 DIAGNOSIS — N2581 Secondary hyperparathyroidism of renal origin: Secondary | ICD-10-CM | POA: Diagnosis not present

## 2023-07-08 DIAGNOSIS — D631 Anemia in chronic kidney disease: Secondary | ICD-10-CM | POA: Diagnosis not present

## 2023-07-08 DIAGNOSIS — N186 End stage renal disease: Secondary | ICD-10-CM | POA: Diagnosis not present

## 2023-07-08 DIAGNOSIS — Z992 Dependence on renal dialysis: Secondary | ICD-10-CM | POA: Diagnosis not present

## 2023-07-10 ENCOUNTER — Other Ambulatory Visit (HOSPITAL_COMMUNITY): Payer: Self-pay

## 2023-07-10 DIAGNOSIS — N186 End stage renal disease: Secondary | ICD-10-CM | POA: Diagnosis not present

## 2023-07-10 DIAGNOSIS — D631 Anemia in chronic kidney disease: Secondary | ICD-10-CM | POA: Diagnosis not present

## 2023-07-10 DIAGNOSIS — Z992 Dependence on renal dialysis: Secondary | ICD-10-CM | POA: Diagnosis not present

## 2023-07-10 DIAGNOSIS — N2581 Secondary hyperparathyroidism of renal origin: Secondary | ICD-10-CM | POA: Diagnosis not present

## 2023-07-12 ENCOUNTER — Other Ambulatory Visit (HOSPITAL_COMMUNITY): Payer: Self-pay

## 2023-07-12 DIAGNOSIS — Z992 Dependence on renal dialysis: Secondary | ICD-10-CM | POA: Diagnosis not present

## 2023-07-12 DIAGNOSIS — D631 Anemia in chronic kidney disease: Secondary | ICD-10-CM | POA: Diagnosis not present

## 2023-07-12 DIAGNOSIS — N186 End stage renal disease: Secondary | ICD-10-CM | POA: Diagnosis not present

## 2023-07-12 DIAGNOSIS — N2581 Secondary hyperparathyroidism of renal origin: Secondary | ICD-10-CM | POA: Diagnosis not present

## 2023-07-14 DIAGNOSIS — N186 End stage renal disease: Secondary | ICD-10-CM | POA: Diagnosis not present

## 2023-07-14 DIAGNOSIS — Z992 Dependence on renal dialysis: Secondary | ICD-10-CM | POA: Diagnosis not present

## 2023-07-14 DIAGNOSIS — I120 Hypertensive chronic kidney disease with stage 5 chronic kidney disease or end stage renal disease: Secondary | ICD-10-CM | POA: Diagnosis not present

## 2023-07-15 DIAGNOSIS — N186 End stage renal disease: Secondary | ICD-10-CM | POA: Diagnosis not present

## 2023-07-15 DIAGNOSIS — N2581 Secondary hyperparathyroidism of renal origin: Secondary | ICD-10-CM | POA: Diagnosis not present

## 2023-07-15 DIAGNOSIS — Z992 Dependence on renal dialysis: Secondary | ICD-10-CM | POA: Diagnosis not present

## 2023-07-15 DIAGNOSIS — D631 Anemia in chronic kidney disease: Secondary | ICD-10-CM | POA: Diagnosis not present

## 2023-07-17 DIAGNOSIS — N186 End stage renal disease: Secondary | ICD-10-CM | POA: Diagnosis not present

## 2023-07-17 DIAGNOSIS — Z992 Dependence on renal dialysis: Secondary | ICD-10-CM | POA: Diagnosis not present

## 2023-07-17 DIAGNOSIS — D631 Anemia in chronic kidney disease: Secondary | ICD-10-CM | POA: Diagnosis not present

## 2023-07-17 DIAGNOSIS — N2581 Secondary hyperparathyroidism of renal origin: Secondary | ICD-10-CM | POA: Diagnosis not present

## 2023-07-19 DIAGNOSIS — Z992 Dependence on renal dialysis: Secondary | ICD-10-CM | POA: Diagnosis not present

## 2023-07-19 DIAGNOSIS — N2581 Secondary hyperparathyroidism of renal origin: Secondary | ICD-10-CM | POA: Diagnosis not present

## 2023-07-19 DIAGNOSIS — D631 Anemia in chronic kidney disease: Secondary | ICD-10-CM | POA: Diagnosis not present

## 2023-07-19 DIAGNOSIS — N186 End stage renal disease: Secondary | ICD-10-CM | POA: Diagnosis not present

## 2023-07-22 DIAGNOSIS — N2581 Secondary hyperparathyroidism of renal origin: Secondary | ICD-10-CM | POA: Diagnosis not present

## 2023-07-22 DIAGNOSIS — N186 End stage renal disease: Secondary | ICD-10-CM | POA: Diagnosis not present

## 2023-07-22 DIAGNOSIS — D631 Anemia in chronic kidney disease: Secondary | ICD-10-CM | POA: Diagnosis not present

## 2023-07-22 DIAGNOSIS — Z992 Dependence on renal dialysis: Secondary | ICD-10-CM | POA: Diagnosis not present

## 2023-07-24 DIAGNOSIS — N186 End stage renal disease: Secondary | ICD-10-CM | POA: Diagnosis not present

## 2023-07-24 DIAGNOSIS — D631 Anemia in chronic kidney disease: Secondary | ICD-10-CM | POA: Diagnosis not present

## 2023-07-24 DIAGNOSIS — Z992 Dependence on renal dialysis: Secondary | ICD-10-CM | POA: Diagnosis not present

## 2023-07-24 DIAGNOSIS — N2581 Secondary hyperparathyroidism of renal origin: Secondary | ICD-10-CM | POA: Diagnosis not present

## 2023-07-26 DIAGNOSIS — Z992 Dependence on renal dialysis: Secondary | ICD-10-CM | POA: Diagnosis not present

## 2023-07-26 DIAGNOSIS — D631 Anemia in chronic kidney disease: Secondary | ICD-10-CM | POA: Diagnosis not present

## 2023-07-26 DIAGNOSIS — N2581 Secondary hyperparathyroidism of renal origin: Secondary | ICD-10-CM | POA: Diagnosis not present

## 2023-07-26 DIAGNOSIS — N186 End stage renal disease: Secondary | ICD-10-CM | POA: Diagnosis not present

## 2023-07-29 DIAGNOSIS — Z992 Dependence on renal dialysis: Secondary | ICD-10-CM | POA: Diagnosis not present

## 2023-07-29 DIAGNOSIS — N186 End stage renal disease: Secondary | ICD-10-CM | POA: Diagnosis not present

## 2023-07-29 DIAGNOSIS — N2581 Secondary hyperparathyroidism of renal origin: Secondary | ICD-10-CM | POA: Diagnosis not present

## 2023-07-29 DIAGNOSIS — D631 Anemia in chronic kidney disease: Secondary | ICD-10-CM | POA: Diagnosis not present

## 2023-07-30 ENCOUNTER — Ambulatory Visit: Payer: Medicare Other | Admitting: Cardiology

## 2023-07-31 ENCOUNTER — Other Ambulatory Visit: Payer: Self-pay

## 2023-07-31 DIAGNOSIS — N186 End stage renal disease: Secondary | ICD-10-CM | POA: Diagnosis not present

## 2023-07-31 DIAGNOSIS — N2581 Secondary hyperparathyroidism of renal origin: Secondary | ICD-10-CM | POA: Diagnosis not present

## 2023-07-31 DIAGNOSIS — Z992 Dependence on renal dialysis: Secondary | ICD-10-CM | POA: Diagnosis not present

## 2023-07-31 DIAGNOSIS — D631 Anemia in chronic kidney disease: Secondary | ICD-10-CM | POA: Diagnosis not present

## 2023-08-02 DIAGNOSIS — N2581 Secondary hyperparathyroidism of renal origin: Secondary | ICD-10-CM | POA: Diagnosis not present

## 2023-08-02 DIAGNOSIS — D631 Anemia in chronic kidney disease: Secondary | ICD-10-CM | POA: Diagnosis not present

## 2023-08-02 DIAGNOSIS — N186 End stage renal disease: Secondary | ICD-10-CM | POA: Diagnosis not present

## 2023-08-02 DIAGNOSIS — Z992 Dependence on renal dialysis: Secondary | ICD-10-CM | POA: Diagnosis not present

## 2023-08-05 ENCOUNTER — Other Ambulatory Visit: Payer: Self-pay

## 2023-08-05 ENCOUNTER — Other Ambulatory Visit (HOSPITAL_COMMUNITY): Payer: Self-pay

## 2023-08-05 DIAGNOSIS — N186 End stage renal disease: Secondary | ICD-10-CM | POA: Diagnosis not present

## 2023-08-05 DIAGNOSIS — N2581 Secondary hyperparathyroidism of renal origin: Secondary | ICD-10-CM | POA: Diagnosis not present

## 2023-08-05 DIAGNOSIS — D631 Anemia in chronic kidney disease: Secondary | ICD-10-CM | POA: Diagnosis not present

## 2023-08-05 DIAGNOSIS — Z992 Dependence on renal dialysis: Secondary | ICD-10-CM | POA: Diagnosis not present

## 2023-08-05 NOTE — Progress Notes (Signed)
Specialty Pharmacy Refill Coordination Note  Tiffany Golden is a 47 y.o. female contacted today regarding refills of specialty medication(s) Bictegravir-Emtricitab-Tenofov .  Patient requested Delivery  on 08/08/23  to verified address [address], [name], [place] [address]   Medication will be filled on 08/07/23.

## 2023-08-07 ENCOUNTER — Other Ambulatory Visit: Payer: Self-pay

## 2023-08-07 DIAGNOSIS — Z992 Dependence on renal dialysis: Secondary | ICD-10-CM | POA: Diagnosis not present

## 2023-08-07 DIAGNOSIS — N186 End stage renal disease: Secondary | ICD-10-CM | POA: Diagnosis not present

## 2023-08-07 DIAGNOSIS — D631 Anemia in chronic kidney disease: Secondary | ICD-10-CM | POA: Diagnosis not present

## 2023-08-07 DIAGNOSIS — N2581 Secondary hyperparathyroidism of renal origin: Secondary | ICD-10-CM | POA: Diagnosis not present

## 2023-08-09 DIAGNOSIS — N186 End stage renal disease: Secondary | ICD-10-CM | POA: Diagnosis not present

## 2023-08-09 DIAGNOSIS — Z992 Dependence on renal dialysis: Secondary | ICD-10-CM | POA: Diagnosis not present

## 2023-08-09 DIAGNOSIS — D631 Anemia in chronic kidney disease: Secondary | ICD-10-CM | POA: Diagnosis not present

## 2023-08-09 DIAGNOSIS — N2581 Secondary hyperparathyroidism of renal origin: Secondary | ICD-10-CM | POA: Diagnosis not present

## 2023-08-12 DIAGNOSIS — N2581 Secondary hyperparathyroidism of renal origin: Secondary | ICD-10-CM | POA: Diagnosis not present

## 2023-08-12 DIAGNOSIS — D631 Anemia in chronic kidney disease: Secondary | ICD-10-CM | POA: Diagnosis not present

## 2023-08-12 DIAGNOSIS — Z992 Dependence on renal dialysis: Secondary | ICD-10-CM | POA: Diagnosis not present

## 2023-08-12 DIAGNOSIS — N186 End stage renal disease: Secondary | ICD-10-CM | POA: Diagnosis not present

## 2023-08-13 DIAGNOSIS — I120 Hypertensive chronic kidney disease with stage 5 chronic kidney disease or end stage renal disease: Secondary | ICD-10-CM | POA: Diagnosis not present

## 2023-08-13 DIAGNOSIS — N186 End stage renal disease: Secondary | ICD-10-CM | POA: Diagnosis not present

## 2023-08-13 DIAGNOSIS — Z992 Dependence on renal dialysis: Secondary | ICD-10-CM | POA: Diagnosis not present

## 2023-08-14 DIAGNOSIS — N186 End stage renal disease: Secondary | ICD-10-CM | POA: Diagnosis not present

## 2023-08-14 DIAGNOSIS — N2581 Secondary hyperparathyroidism of renal origin: Secondary | ICD-10-CM | POA: Diagnosis not present

## 2023-08-14 DIAGNOSIS — D631 Anemia in chronic kidney disease: Secondary | ICD-10-CM | POA: Diagnosis not present

## 2023-08-14 DIAGNOSIS — Z992 Dependence on renal dialysis: Secondary | ICD-10-CM | POA: Diagnosis not present

## 2023-08-14 DIAGNOSIS — Z23 Encounter for immunization: Secondary | ICD-10-CM | POA: Diagnosis not present

## 2023-08-16 DIAGNOSIS — Z992 Dependence on renal dialysis: Secondary | ICD-10-CM | POA: Diagnosis not present

## 2023-08-16 DIAGNOSIS — Z23 Encounter for immunization: Secondary | ICD-10-CM | POA: Diagnosis not present

## 2023-08-16 DIAGNOSIS — D631 Anemia in chronic kidney disease: Secondary | ICD-10-CM | POA: Diagnosis not present

## 2023-08-16 DIAGNOSIS — N2581 Secondary hyperparathyroidism of renal origin: Secondary | ICD-10-CM | POA: Diagnosis not present

## 2023-08-16 DIAGNOSIS — N186 End stage renal disease: Secondary | ICD-10-CM | POA: Diagnosis not present

## 2023-08-19 DIAGNOSIS — N186 End stage renal disease: Secondary | ICD-10-CM | POA: Diagnosis not present

## 2023-08-19 DIAGNOSIS — N2581 Secondary hyperparathyroidism of renal origin: Secondary | ICD-10-CM | POA: Diagnosis not present

## 2023-08-19 DIAGNOSIS — Z23 Encounter for immunization: Secondary | ICD-10-CM | POA: Diagnosis not present

## 2023-08-19 DIAGNOSIS — D631 Anemia in chronic kidney disease: Secondary | ICD-10-CM | POA: Diagnosis not present

## 2023-08-19 DIAGNOSIS — Z992 Dependence on renal dialysis: Secondary | ICD-10-CM | POA: Diagnosis not present

## 2023-08-21 DIAGNOSIS — Z23 Encounter for immunization: Secondary | ICD-10-CM | POA: Diagnosis not present

## 2023-08-21 DIAGNOSIS — N186 End stage renal disease: Secondary | ICD-10-CM | POA: Diagnosis not present

## 2023-08-21 DIAGNOSIS — D631 Anemia in chronic kidney disease: Secondary | ICD-10-CM | POA: Diagnosis not present

## 2023-08-21 DIAGNOSIS — N2581 Secondary hyperparathyroidism of renal origin: Secondary | ICD-10-CM | POA: Diagnosis not present

## 2023-08-21 DIAGNOSIS — Z992 Dependence on renal dialysis: Secondary | ICD-10-CM | POA: Diagnosis not present

## 2023-08-23 DIAGNOSIS — Z23 Encounter for immunization: Secondary | ICD-10-CM | POA: Diagnosis not present

## 2023-08-23 DIAGNOSIS — D631 Anemia in chronic kidney disease: Secondary | ICD-10-CM | POA: Diagnosis not present

## 2023-08-23 DIAGNOSIS — N186 End stage renal disease: Secondary | ICD-10-CM | POA: Diagnosis not present

## 2023-08-23 DIAGNOSIS — N2581 Secondary hyperparathyroidism of renal origin: Secondary | ICD-10-CM | POA: Diagnosis not present

## 2023-08-23 DIAGNOSIS — Z992 Dependence on renal dialysis: Secondary | ICD-10-CM | POA: Diagnosis not present

## 2023-08-26 DIAGNOSIS — N2581 Secondary hyperparathyroidism of renal origin: Secondary | ICD-10-CM | POA: Diagnosis not present

## 2023-08-26 DIAGNOSIS — Z23 Encounter for immunization: Secondary | ICD-10-CM | POA: Diagnosis not present

## 2023-08-26 DIAGNOSIS — N186 End stage renal disease: Secondary | ICD-10-CM | POA: Diagnosis not present

## 2023-08-26 DIAGNOSIS — D631 Anemia in chronic kidney disease: Secondary | ICD-10-CM | POA: Diagnosis not present

## 2023-08-26 DIAGNOSIS — Z992 Dependence on renal dialysis: Secondary | ICD-10-CM | POA: Diagnosis not present

## 2023-08-28 DIAGNOSIS — N2581 Secondary hyperparathyroidism of renal origin: Secondary | ICD-10-CM | POA: Diagnosis not present

## 2023-08-28 DIAGNOSIS — Z992 Dependence on renal dialysis: Secondary | ICD-10-CM | POA: Diagnosis not present

## 2023-08-28 DIAGNOSIS — N186 End stage renal disease: Secondary | ICD-10-CM | POA: Diagnosis not present

## 2023-08-28 DIAGNOSIS — Z23 Encounter for immunization: Secondary | ICD-10-CM | POA: Diagnosis not present

## 2023-08-28 DIAGNOSIS — D631 Anemia in chronic kidney disease: Secondary | ICD-10-CM | POA: Diagnosis not present

## 2023-08-30 DIAGNOSIS — D631 Anemia in chronic kidney disease: Secondary | ICD-10-CM | POA: Diagnosis not present

## 2023-08-30 DIAGNOSIS — N2581 Secondary hyperparathyroidism of renal origin: Secondary | ICD-10-CM | POA: Diagnosis not present

## 2023-08-30 DIAGNOSIS — Z23 Encounter for immunization: Secondary | ICD-10-CM | POA: Diagnosis not present

## 2023-08-30 DIAGNOSIS — Z992 Dependence on renal dialysis: Secondary | ICD-10-CM | POA: Diagnosis not present

## 2023-08-30 DIAGNOSIS — N186 End stage renal disease: Secondary | ICD-10-CM | POA: Diagnosis not present

## 2023-09-02 DIAGNOSIS — N2581 Secondary hyperparathyroidism of renal origin: Secondary | ICD-10-CM | POA: Diagnosis not present

## 2023-09-02 DIAGNOSIS — N186 End stage renal disease: Secondary | ICD-10-CM | POA: Diagnosis not present

## 2023-09-02 DIAGNOSIS — Z23 Encounter for immunization: Secondary | ICD-10-CM | POA: Diagnosis not present

## 2023-09-02 DIAGNOSIS — D631 Anemia in chronic kidney disease: Secondary | ICD-10-CM | POA: Diagnosis not present

## 2023-09-02 DIAGNOSIS — Z992 Dependence on renal dialysis: Secondary | ICD-10-CM | POA: Diagnosis not present

## 2023-09-04 DIAGNOSIS — D631 Anemia in chronic kidney disease: Secondary | ICD-10-CM | POA: Diagnosis not present

## 2023-09-04 DIAGNOSIS — N186 End stage renal disease: Secondary | ICD-10-CM | POA: Diagnosis not present

## 2023-09-04 DIAGNOSIS — Z23 Encounter for immunization: Secondary | ICD-10-CM | POA: Diagnosis not present

## 2023-09-04 DIAGNOSIS — Z992 Dependence on renal dialysis: Secondary | ICD-10-CM | POA: Diagnosis not present

## 2023-09-04 DIAGNOSIS — N2581 Secondary hyperparathyroidism of renal origin: Secondary | ICD-10-CM | POA: Diagnosis not present

## 2023-09-06 ENCOUNTER — Other Ambulatory Visit: Payer: Self-pay

## 2023-09-06 DIAGNOSIS — N186 End stage renal disease: Secondary | ICD-10-CM | POA: Diagnosis not present

## 2023-09-06 DIAGNOSIS — Z992 Dependence on renal dialysis: Secondary | ICD-10-CM | POA: Diagnosis not present

## 2023-09-06 DIAGNOSIS — Z23 Encounter for immunization: Secondary | ICD-10-CM | POA: Diagnosis not present

## 2023-09-06 DIAGNOSIS — N2581 Secondary hyperparathyroidism of renal origin: Secondary | ICD-10-CM | POA: Diagnosis not present

## 2023-09-06 DIAGNOSIS — D631 Anemia in chronic kidney disease: Secondary | ICD-10-CM | POA: Diagnosis not present

## 2023-09-09 ENCOUNTER — Other Ambulatory Visit: Payer: Self-pay

## 2023-09-09 ENCOUNTER — Other Ambulatory Visit: Payer: Self-pay | Admitting: Infectious Disease

## 2023-09-09 ENCOUNTER — Telehealth: Payer: Self-pay

## 2023-09-09 DIAGNOSIS — N186 End stage renal disease: Secondary | ICD-10-CM | POA: Diagnosis not present

## 2023-09-09 DIAGNOSIS — N2581 Secondary hyperparathyroidism of renal origin: Secondary | ICD-10-CM | POA: Diagnosis not present

## 2023-09-09 DIAGNOSIS — B20 Human immunodeficiency virus [HIV] disease: Secondary | ICD-10-CM

## 2023-09-09 DIAGNOSIS — Z23 Encounter for immunization: Secondary | ICD-10-CM | POA: Diagnosis not present

## 2023-09-09 DIAGNOSIS — Z992 Dependence on renal dialysis: Secondary | ICD-10-CM | POA: Diagnosis not present

## 2023-09-09 DIAGNOSIS — D631 Anemia in chronic kidney disease: Secondary | ICD-10-CM | POA: Diagnosis not present

## 2023-09-09 MED ORDER — BIKTARVY 50-200-25 MG PO TABS
1.0000 | ORAL_TABLET | Freq: Every day | ORAL | 3 refills | Status: DC
  Filled 2023-09-09: qty 30, 30d supply, fill #0
  Filled 2023-10-09: qty 30, 30d supply, fill #1
  Filled 2023-11-07: qty 30, 30d supply, fill #2
  Filled 2023-12-05: qty 30, 30d supply, fill #3

## 2023-09-09 NOTE — Progress Notes (Signed)
Refill received. Will mail 10/30.

## 2023-09-09 NOTE — Progress Notes (Addendum)
Specialty Pharmacy Refill Coordination Note  Tiffany Golden is a 47 y.o. female contacted today regarding refills of specialty medication(s) Bictegravir-Emtricitab-Tenofov   Patient requested Delivery   Delivery date: 09/12/23   Verified address: [address], [place], [place] [address]   Medication will be filled on 09/11/23.  Refill request pending. Notify patient if delayed.

## 2023-09-09 NOTE — Telephone Encounter (Signed)
Called patient to schedule f/u at the beginning of year w/ Dr. Daiva Eves. No answer and left VM

## 2023-09-11 DIAGNOSIS — Z992 Dependence on renal dialysis: Secondary | ICD-10-CM | POA: Diagnosis not present

## 2023-09-11 DIAGNOSIS — N2581 Secondary hyperparathyroidism of renal origin: Secondary | ICD-10-CM | POA: Diagnosis not present

## 2023-09-11 DIAGNOSIS — D631 Anemia in chronic kidney disease: Secondary | ICD-10-CM | POA: Diagnosis not present

## 2023-09-11 DIAGNOSIS — Z23 Encounter for immunization: Secondary | ICD-10-CM | POA: Diagnosis not present

## 2023-09-11 DIAGNOSIS — N186 End stage renal disease: Secondary | ICD-10-CM | POA: Diagnosis not present

## 2023-09-13 DIAGNOSIS — N2581 Secondary hyperparathyroidism of renal origin: Secondary | ICD-10-CM | POA: Diagnosis not present

## 2023-09-13 DIAGNOSIS — Z992 Dependence on renal dialysis: Secondary | ICD-10-CM | POA: Diagnosis not present

## 2023-09-13 DIAGNOSIS — D631 Anemia in chronic kidney disease: Secondary | ICD-10-CM | POA: Diagnosis not present

## 2023-09-13 DIAGNOSIS — N186 End stage renal disease: Secondary | ICD-10-CM | POA: Diagnosis not present

## 2023-09-13 DIAGNOSIS — I120 Hypertensive chronic kidney disease with stage 5 chronic kidney disease or end stage renal disease: Secondary | ICD-10-CM | POA: Diagnosis not present

## 2023-09-16 DIAGNOSIS — N2581 Secondary hyperparathyroidism of renal origin: Secondary | ICD-10-CM | POA: Diagnosis not present

## 2023-09-16 DIAGNOSIS — N186 End stage renal disease: Secondary | ICD-10-CM | POA: Diagnosis not present

## 2023-09-16 DIAGNOSIS — Z992 Dependence on renal dialysis: Secondary | ICD-10-CM | POA: Diagnosis not present

## 2023-09-16 DIAGNOSIS — D631 Anemia in chronic kidney disease: Secondary | ICD-10-CM | POA: Diagnosis not present

## 2023-09-18 DIAGNOSIS — N2581 Secondary hyperparathyroidism of renal origin: Secondary | ICD-10-CM | POA: Diagnosis not present

## 2023-09-18 DIAGNOSIS — Z992 Dependence on renal dialysis: Secondary | ICD-10-CM | POA: Diagnosis not present

## 2023-09-18 DIAGNOSIS — D631 Anemia in chronic kidney disease: Secondary | ICD-10-CM | POA: Diagnosis not present

## 2023-09-18 DIAGNOSIS — N186 End stage renal disease: Secondary | ICD-10-CM | POA: Diagnosis not present

## 2023-09-19 DIAGNOSIS — Z992 Dependence on renal dialysis: Secondary | ICD-10-CM | POA: Diagnosis not present

## 2023-09-19 DIAGNOSIS — D631 Anemia in chronic kidney disease: Secondary | ICD-10-CM | POA: Diagnosis not present

## 2023-09-19 DIAGNOSIS — N2581 Secondary hyperparathyroidism of renal origin: Secondary | ICD-10-CM | POA: Diagnosis not present

## 2023-09-19 DIAGNOSIS — N186 End stage renal disease: Secondary | ICD-10-CM | POA: Diagnosis not present

## 2023-09-23 DIAGNOSIS — Z992 Dependence on renal dialysis: Secondary | ICD-10-CM | POA: Diagnosis not present

## 2023-09-23 DIAGNOSIS — N186 End stage renal disease: Secondary | ICD-10-CM | POA: Diagnosis not present

## 2023-09-23 DIAGNOSIS — N2581 Secondary hyperparathyroidism of renal origin: Secondary | ICD-10-CM | POA: Diagnosis not present

## 2023-09-23 DIAGNOSIS — D631 Anemia in chronic kidney disease: Secondary | ICD-10-CM | POA: Diagnosis not present

## 2023-09-25 DIAGNOSIS — N2581 Secondary hyperparathyroidism of renal origin: Secondary | ICD-10-CM | POA: Diagnosis not present

## 2023-09-25 DIAGNOSIS — D631 Anemia in chronic kidney disease: Secondary | ICD-10-CM | POA: Diagnosis not present

## 2023-09-25 DIAGNOSIS — N186 End stage renal disease: Secondary | ICD-10-CM | POA: Diagnosis not present

## 2023-09-25 DIAGNOSIS — Z992 Dependence on renal dialysis: Secondary | ICD-10-CM | POA: Diagnosis not present

## 2023-09-27 DIAGNOSIS — N2581 Secondary hyperparathyroidism of renal origin: Secondary | ICD-10-CM | POA: Diagnosis not present

## 2023-09-27 DIAGNOSIS — D631 Anemia in chronic kidney disease: Secondary | ICD-10-CM | POA: Diagnosis not present

## 2023-09-27 DIAGNOSIS — Z992 Dependence on renal dialysis: Secondary | ICD-10-CM | POA: Diagnosis not present

## 2023-09-27 DIAGNOSIS — N186 End stage renal disease: Secondary | ICD-10-CM | POA: Diagnosis not present

## 2023-09-30 DIAGNOSIS — N186 End stage renal disease: Secondary | ICD-10-CM | POA: Diagnosis not present

## 2023-09-30 DIAGNOSIS — Z992 Dependence on renal dialysis: Secondary | ICD-10-CM | POA: Diagnosis not present

## 2023-09-30 DIAGNOSIS — N2581 Secondary hyperparathyroidism of renal origin: Secondary | ICD-10-CM | POA: Diagnosis not present

## 2023-09-30 DIAGNOSIS — D631 Anemia in chronic kidney disease: Secondary | ICD-10-CM | POA: Diagnosis not present

## 2023-10-02 DIAGNOSIS — N186 End stage renal disease: Secondary | ICD-10-CM | POA: Diagnosis not present

## 2023-10-02 DIAGNOSIS — Z992 Dependence on renal dialysis: Secondary | ICD-10-CM | POA: Diagnosis not present

## 2023-10-02 DIAGNOSIS — N2581 Secondary hyperparathyroidism of renal origin: Secondary | ICD-10-CM | POA: Diagnosis not present

## 2023-10-02 DIAGNOSIS — D631 Anemia in chronic kidney disease: Secondary | ICD-10-CM | POA: Diagnosis not present

## 2023-10-03 ENCOUNTER — Other Ambulatory Visit: Payer: Self-pay

## 2023-10-04 DIAGNOSIS — N2581 Secondary hyperparathyroidism of renal origin: Secondary | ICD-10-CM | POA: Diagnosis not present

## 2023-10-04 DIAGNOSIS — D631 Anemia in chronic kidney disease: Secondary | ICD-10-CM | POA: Diagnosis not present

## 2023-10-04 DIAGNOSIS — Z992 Dependence on renal dialysis: Secondary | ICD-10-CM | POA: Diagnosis not present

## 2023-10-04 DIAGNOSIS — N186 End stage renal disease: Secondary | ICD-10-CM | POA: Diagnosis not present

## 2023-10-06 DIAGNOSIS — N186 End stage renal disease: Secondary | ICD-10-CM | POA: Diagnosis not present

## 2023-10-06 DIAGNOSIS — Z992 Dependence on renal dialysis: Secondary | ICD-10-CM | POA: Diagnosis not present

## 2023-10-06 DIAGNOSIS — D631 Anemia in chronic kidney disease: Secondary | ICD-10-CM | POA: Diagnosis not present

## 2023-10-06 DIAGNOSIS — N2581 Secondary hyperparathyroidism of renal origin: Secondary | ICD-10-CM | POA: Diagnosis not present

## 2023-10-09 ENCOUNTER — Other Ambulatory Visit: Payer: Self-pay

## 2023-10-09 DIAGNOSIS — D631 Anemia in chronic kidney disease: Secondary | ICD-10-CM | POA: Diagnosis not present

## 2023-10-09 DIAGNOSIS — Z992 Dependence on renal dialysis: Secondary | ICD-10-CM | POA: Diagnosis not present

## 2023-10-09 DIAGNOSIS — N2581 Secondary hyperparathyroidism of renal origin: Secondary | ICD-10-CM | POA: Diagnosis not present

## 2023-10-09 DIAGNOSIS — N186 End stage renal disease: Secondary | ICD-10-CM | POA: Diagnosis not present

## 2023-10-09 NOTE — Progress Notes (Signed)
Specialty Pharmacy Refill Coordination Note  Tiffany Golden is a 47 y.o. female contacted today regarding refills of specialty medication(s) Bictegravir-Emtricitab-Tenofov   Patient requested Delivery   Delivery date: 10/17/23   Verified address: [address]   [name] [address]   Medication will be filled on 10/16/23.

## 2023-10-11 DIAGNOSIS — N2581 Secondary hyperparathyroidism of renal origin: Secondary | ICD-10-CM | POA: Diagnosis not present

## 2023-10-11 DIAGNOSIS — N186 End stage renal disease: Secondary | ICD-10-CM | POA: Diagnosis not present

## 2023-10-11 DIAGNOSIS — Z992 Dependence on renal dialysis: Secondary | ICD-10-CM | POA: Diagnosis not present

## 2023-10-11 DIAGNOSIS — D631 Anemia in chronic kidney disease: Secondary | ICD-10-CM | POA: Diagnosis not present

## 2023-10-13 DIAGNOSIS — Z992 Dependence on renal dialysis: Secondary | ICD-10-CM | POA: Diagnosis not present

## 2023-10-13 DIAGNOSIS — I120 Hypertensive chronic kidney disease with stage 5 chronic kidney disease or end stage renal disease: Secondary | ICD-10-CM | POA: Diagnosis not present

## 2023-10-13 DIAGNOSIS — N186 End stage renal disease: Secondary | ICD-10-CM | POA: Diagnosis not present

## 2023-10-14 DIAGNOSIS — Z992 Dependence on renal dialysis: Secondary | ICD-10-CM | POA: Diagnosis not present

## 2023-10-14 DIAGNOSIS — N186 End stage renal disease: Secondary | ICD-10-CM | POA: Diagnosis not present

## 2023-10-14 DIAGNOSIS — N2581 Secondary hyperparathyroidism of renal origin: Secondary | ICD-10-CM | POA: Diagnosis not present

## 2023-10-14 DIAGNOSIS — D631 Anemia in chronic kidney disease: Secondary | ICD-10-CM | POA: Diagnosis not present

## 2023-10-16 ENCOUNTER — Other Ambulatory Visit: Payer: Self-pay

## 2023-10-16 DIAGNOSIS — Z992 Dependence on renal dialysis: Secondary | ICD-10-CM | POA: Diagnosis not present

## 2023-10-16 DIAGNOSIS — N2581 Secondary hyperparathyroidism of renal origin: Secondary | ICD-10-CM | POA: Diagnosis not present

## 2023-10-16 DIAGNOSIS — D631 Anemia in chronic kidney disease: Secondary | ICD-10-CM | POA: Diagnosis not present

## 2023-10-16 DIAGNOSIS — N186 End stage renal disease: Secondary | ICD-10-CM | POA: Diagnosis not present

## 2023-10-18 DIAGNOSIS — Z992 Dependence on renal dialysis: Secondary | ICD-10-CM | POA: Diagnosis not present

## 2023-10-18 DIAGNOSIS — D631 Anemia in chronic kidney disease: Secondary | ICD-10-CM | POA: Diagnosis not present

## 2023-10-18 DIAGNOSIS — N186 End stage renal disease: Secondary | ICD-10-CM | POA: Diagnosis not present

## 2023-10-18 DIAGNOSIS — N2581 Secondary hyperparathyroidism of renal origin: Secondary | ICD-10-CM | POA: Diagnosis not present

## 2023-10-21 DIAGNOSIS — Z992 Dependence on renal dialysis: Secondary | ICD-10-CM | POA: Diagnosis not present

## 2023-10-21 DIAGNOSIS — D631 Anemia in chronic kidney disease: Secondary | ICD-10-CM | POA: Diagnosis not present

## 2023-10-21 DIAGNOSIS — N186 End stage renal disease: Secondary | ICD-10-CM | POA: Diagnosis not present

## 2023-10-21 DIAGNOSIS — N2581 Secondary hyperparathyroidism of renal origin: Secondary | ICD-10-CM | POA: Diagnosis not present

## 2023-10-23 DIAGNOSIS — Z992 Dependence on renal dialysis: Secondary | ICD-10-CM | POA: Diagnosis not present

## 2023-10-23 DIAGNOSIS — N186 End stage renal disease: Secondary | ICD-10-CM | POA: Diagnosis not present

## 2023-10-23 DIAGNOSIS — N2581 Secondary hyperparathyroidism of renal origin: Secondary | ICD-10-CM | POA: Diagnosis not present

## 2023-10-23 DIAGNOSIS — D631 Anemia in chronic kidney disease: Secondary | ICD-10-CM | POA: Diagnosis not present

## 2023-10-25 DIAGNOSIS — Z992 Dependence on renal dialysis: Secondary | ICD-10-CM | POA: Diagnosis not present

## 2023-10-25 DIAGNOSIS — N2581 Secondary hyperparathyroidism of renal origin: Secondary | ICD-10-CM | POA: Diagnosis not present

## 2023-10-25 DIAGNOSIS — D631 Anemia in chronic kidney disease: Secondary | ICD-10-CM | POA: Diagnosis not present

## 2023-10-25 DIAGNOSIS — N186 End stage renal disease: Secondary | ICD-10-CM | POA: Diagnosis not present

## 2023-10-28 DIAGNOSIS — N186 End stage renal disease: Secondary | ICD-10-CM | POA: Diagnosis not present

## 2023-10-28 DIAGNOSIS — D631 Anemia in chronic kidney disease: Secondary | ICD-10-CM | POA: Diagnosis not present

## 2023-10-28 DIAGNOSIS — Z992 Dependence on renal dialysis: Secondary | ICD-10-CM | POA: Diagnosis not present

## 2023-10-28 DIAGNOSIS — N2581 Secondary hyperparathyroidism of renal origin: Secondary | ICD-10-CM | POA: Diagnosis not present

## 2023-10-30 DIAGNOSIS — Z992 Dependence on renal dialysis: Secondary | ICD-10-CM | POA: Diagnosis not present

## 2023-10-30 DIAGNOSIS — N2581 Secondary hyperparathyroidism of renal origin: Secondary | ICD-10-CM | POA: Diagnosis not present

## 2023-10-30 DIAGNOSIS — N186 End stage renal disease: Secondary | ICD-10-CM | POA: Diagnosis not present

## 2023-10-30 DIAGNOSIS — D631 Anemia in chronic kidney disease: Secondary | ICD-10-CM | POA: Diagnosis not present

## 2023-11-01 DIAGNOSIS — D631 Anemia in chronic kidney disease: Secondary | ICD-10-CM | POA: Diagnosis not present

## 2023-11-01 DIAGNOSIS — Z992 Dependence on renal dialysis: Secondary | ICD-10-CM | POA: Diagnosis not present

## 2023-11-01 DIAGNOSIS — N186 End stage renal disease: Secondary | ICD-10-CM | POA: Diagnosis not present

## 2023-11-01 DIAGNOSIS — N2581 Secondary hyperparathyroidism of renal origin: Secondary | ICD-10-CM | POA: Diagnosis not present

## 2023-11-03 DIAGNOSIS — N2581 Secondary hyperparathyroidism of renal origin: Secondary | ICD-10-CM | POA: Diagnosis not present

## 2023-11-03 DIAGNOSIS — Z992 Dependence on renal dialysis: Secondary | ICD-10-CM | POA: Diagnosis not present

## 2023-11-03 DIAGNOSIS — N186 End stage renal disease: Secondary | ICD-10-CM | POA: Diagnosis not present

## 2023-11-03 DIAGNOSIS — D631 Anemia in chronic kidney disease: Secondary | ICD-10-CM | POA: Diagnosis not present

## 2023-11-05 ENCOUNTER — Other Ambulatory Visit: Payer: Self-pay

## 2023-11-05 DIAGNOSIS — N186 End stage renal disease: Secondary | ICD-10-CM | POA: Diagnosis not present

## 2023-11-05 DIAGNOSIS — Z992 Dependence on renal dialysis: Secondary | ICD-10-CM | POA: Diagnosis not present

## 2023-11-05 DIAGNOSIS — N2581 Secondary hyperparathyroidism of renal origin: Secondary | ICD-10-CM | POA: Diagnosis not present

## 2023-11-05 DIAGNOSIS — D631 Anemia in chronic kidney disease: Secondary | ICD-10-CM | POA: Diagnosis not present

## 2023-11-07 ENCOUNTER — Other Ambulatory Visit: Payer: Self-pay

## 2023-11-07 NOTE — Progress Notes (Signed)
Specialty Pharmacy Refill Coordination Note  Tiffany Golden is a 47 y.o. female contacted today regarding refills of specialty medication(s) Bictegravir-Emtricitab-Tenofov Musician)   Patient requested Delivery   Delivery date: 11/14/23   Verified address: [address]   [name] [address]   Medication will be filled on 11/12/23.

## 2023-11-08 DIAGNOSIS — D631 Anemia in chronic kidney disease: Secondary | ICD-10-CM | POA: Diagnosis not present

## 2023-11-08 DIAGNOSIS — Z992 Dependence on renal dialysis: Secondary | ICD-10-CM | POA: Diagnosis not present

## 2023-11-08 DIAGNOSIS — N2581 Secondary hyperparathyroidism of renal origin: Secondary | ICD-10-CM | POA: Diagnosis not present

## 2023-11-08 DIAGNOSIS — N186 End stage renal disease: Secondary | ICD-10-CM | POA: Diagnosis not present

## 2023-11-10 DIAGNOSIS — D631 Anemia in chronic kidney disease: Secondary | ICD-10-CM | POA: Diagnosis not present

## 2023-11-10 DIAGNOSIS — N2581 Secondary hyperparathyroidism of renal origin: Secondary | ICD-10-CM | POA: Diagnosis not present

## 2023-11-10 DIAGNOSIS — Z992 Dependence on renal dialysis: Secondary | ICD-10-CM | POA: Diagnosis not present

## 2023-11-10 DIAGNOSIS — N186 End stage renal disease: Secondary | ICD-10-CM | POA: Diagnosis not present

## 2023-11-12 ENCOUNTER — Other Ambulatory Visit: Payer: Self-pay

## 2023-11-12 DIAGNOSIS — Z992 Dependence on renal dialysis: Secondary | ICD-10-CM | POA: Diagnosis not present

## 2023-11-12 DIAGNOSIS — D631 Anemia in chronic kidney disease: Secondary | ICD-10-CM | POA: Diagnosis not present

## 2023-11-12 DIAGNOSIS — N2581 Secondary hyperparathyroidism of renal origin: Secondary | ICD-10-CM | POA: Diagnosis not present

## 2023-11-12 DIAGNOSIS — N186 End stage renal disease: Secondary | ICD-10-CM | POA: Diagnosis not present

## 2023-11-13 DIAGNOSIS — Z992 Dependence on renal dialysis: Secondary | ICD-10-CM | POA: Diagnosis not present

## 2023-11-13 DIAGNOSIS — N186 End stage renal disease: Secondary | ICD-10-CM | POA: Diagnosis not present

## 2023-11-13 DIAGNOSIS — I120 Hypertensive chronic kidney disease with stage 5 chronic kidney disease or end stage renal disease: Secondary | ICD-10-CM | POA: Diagnosis not present

## 2023-11-15 DIAGNOSIS — Z992 Dependence on renal dialysis: Secondary | ICD-10-CM | POA: Diagnosis not present

## 2023-11-15 DIAGNOSIS — N2581 Secondary hyperparathyroidism of renal origin: Secondary | ICD-10-CM | POA: Diagnosis not present

## 2023-11-15 DIAGNOSIS — N186 End stage renal disease: Secondary | ICD-10-CM | POA: Diagnosis not present

## 2023-11-15 DIAGNOSIS — D631 Anemia in chronic kidney disease: Secondary | ICD-10-CM | POA: Diagnosis not present

## 2023-11-18 DIAGNOSIS — N2581 Secondary hyperparathyroidism of renal origin: Secondary | ICD-10-CM | POA: Diagnosis not present

## 2023-11-18 DIAGNOSIS — N186 End stage renal disease: Secondary | ICD-10-CM | POA: Diagnosis not present

## 2023-11-18 DIAGNOSIS — Z992 Dependence on renal dialysis: Secondary | ICD-10-CM | POA: Diagnosis not present

## 2023-11-18 DIAGNOSIS — D631 Anemia in chronic kidney disease: Secondary | ICD-10-CM | POA: Diagnosis not present

## 2023-11-20 DIAGNOSIS — D631 Anemia in chronic kidney disease: Secondary | ICD-10-CM | POA: Diagnosis not present

## 2023-11-20 DIAGNOSIS — N186 End stage renal disease: Secondary | ICD-10-CM | POA: Diagnosis not present

## 2023-11-20 DIAGNOSIS — Z992 Dependence on renal dialysis: Secondary | ICD-10-CM | POA: Diagnosis not present

## 2023-11-20 DIAGNOSIS — N2581 Secondary hyperparathyroidism of renal origin: Secondary | ICD-10-CM | POA: Diagnosis not present

## 2023-11-22 DIAGNOSIS — D631 Anemia in chronic kidney disease: Secondary | ICD-10-CM | POA: Diagnosis not present

## 2023-11-22 DIAGNOSIS — N2581 Secondary hyperparathyroidism of renal origin: Secondary | ICD-10-CM | POA: Diagnosis not present

## 2023-11-22 DIAGNOSIS — Z992 Dependence on renal dialysis: Secondary | ICD-10-CM | POA: Diagnosis not present

## 2023-11-22 DIAGNOSIS — N186 End stage renal disease: Secondary | ICD-10-CM | POA: Diagnosis not present

## 2023-11-25 DIAGNOSIS — Z992 Dependence on renal dialysis: Secondary | ICD-10-CM | POA: Diagnosis not present

## 2023-11-25 DIAGNOSIS — N186 End stage renal disease: Secondary | ICD-10-CM | POA: Diagnosis not present

## 2023-11-25 DIAGNOSIS — D631 Anemia in chronic kidney disease: Secondary | ICD-10-CM | POA: Diagnosis not present

## 2023-11-25 DIAGNOSIS — N2581 Secondary hyperparathyroidism of renal origin: Secondary | ICD-10-CM | POA: Diagnosis not present

## 2023-11-27 DIAGNOSIS — D631 Anemia in chronic kidney disease: Secondary | ICD-10-CM | POA: Diagnosis not present

## 2023-11-27 DIAGNOSIS — N186 End stage renal disease: Secondary | ICD-10-CM | POA: Diagnosis not present

## 2023-11-27 DIAGNOSIS — N2581 Secondary hyperparathyroidism of renal origin: Secondary | ICD-10-CM | POA: Diagnosis not present

## 2023-11-27 DIAGNOSIS — Z992 Dependence on renal dialysis: Secondary | ICD-10-CM | POA: Diagnosis not present

## 2023-11-29 DIAGNOSIS — Z992 Dependence on renal dialysis: Secondary | ICD-10-CM | POA: Diagnosis not present

## 2023-11-29 DIAGNOSIS — N186 End stage renal disease: Secondary | ICD-10-CM | POA: Diagnosis not present

## 2023-11-29 DIAGNOSIS — N2581 Secondary hyperparathyroidism of renal origin: Secondary | ICD-10-CM | POA: Diagnosis not present

## 2023-11-29 DIAGNOSIS — D631 Anemia in chronic kidney disease: Secondary | ICD-10-CM | POA: Diagnosis not present

## 2023-12-02 DIAGNOSIS — N2581 Secondary hyperparathyroidism of renal origin: Secondary | ICD-10-CM | POA: Diagnosis not present

## 2023-12-02 DIAGNOSIS — Z992 Dependence on renal dialysis: Secondary | ICD-10-CM | POA: Diagnosis not present

## 2023-12-02 DIAGNOSIS — D631 Anemia in chronic kidney disease: Secondary | ICD-10-CM | POA: Diagnosis not present

## 2023-12-02 DIAGNOSIS — N186 End stage renal disease: Secondary | ICD-10-CM | POA: Diagnosis not present

## 2023-12-03 ENCOUNTER — Other Ambulatory Visit: Payer: Self-pay

## 2023-12-04 DIAGNOSIS — N2581 Secondary hyperparathyroidism of renal origin: Secondary | ICD-10-CM | POA: Diagnosis not present

## 2023-12-04 DIAGNOSIS — N186 End stage renal disease: Secondary | ICD-10-CM | POA: Diagnosis not present

## 2023-12-04 DIAGNOSIS — Z992 Dependence on renal dialysis: Secondary | ICD-10-CM | POA: Diagnosis not present

## 2023-12-04 DIAGNOSIS — D631 Anemia in chronic kidney disease: Secondary | ICD-10-CM | POA: Diagnosis not present

## 2023-12-05 ENCOUNTER — Other Ambulatory Visit: Payer: Self-pay

## 2023-12-05 NOTE — Progress Notes (Signed)
Specialty Pharmacy Refill Coordination Note  Tiffany Golden is a 48 y.o. female contacted today regarding refills of specialty medication(s) Bictegravir-Emtricitab-Tenofov [name])   Patient requested Delivery   Delivery date: 12/11/23   Verified address: [address]   Medication will be filled on 12/10/23.

## 2023-12-06 DIAGNOSIS — D631 Anemia in chronic kidney disease: Secondary | ICD-10-CM | POA: Diagnosis not present

## 2023-12-06 DIAGNOSIS — N2581 Secondary hyperparathyroidism of renal origin: Secondary | ICD-10-CM | POA: Diagnosis not present

## 2023-12-06 DIAGNOSIS — N186 End stage renal disease: Secondary | ICD-10-CM | POA: Diagnosis not present

## 2023-12-06 DIAGNOSIS — Z992 Dependence on renal dialysis: Secondary | ICD-10-CM | POA: Diagnosis not present

## 2023-12-09 DIAGNOSIS — N186 End stage renal disease: Secondary | ICD-10-CM | POA: Diagnosis not present

## 2023-12-09 DIAGNOSIS — N2581 Secondary hyperparathyroidism of renal origin: Secondary | ICD-10-CM | POA: Diagnosis not present

## 2023-12-09 DIAGNOSIS — Z992 Dependence on renal dialysis: Secondary | ICD-10-CM | POA: Diagnosis not present

## 2023-12-09 DIAGNOSIS — D631 Anemia in chronic kidney disease: Secondary | ICD-10-CM | POA: Diagnosis not present

## 2023-12-10 ENCOUNTER — Other Ambulatory Visit: Payer: Self-pay

## 2023-12-10 ENCOUNTER — Ambulatory Visit: Payer: Medicare Other | Attending: Cardiology | Admitting: Cardiology

## 2023-12-10 ENCOUNTER — Other Ambulatory Visit (HOSPITAL_COMMUNITY): Payer: Self-pay

## 2023-12-10 ENCOUNTER — Encounter (HOSPITAL_COMMUNITY): Payer: Self-pay | Admitting: *Deleted

## 2023-12-10 ENCOUNTER — Encounter: Payer: Self-pay | Admitting: Cardiology

## 2023-12-10 ENCOUNTER — Telehealth: Payer: Self-pay

## 2023-12-10 VITALS — BP 118/76 | HR 70 | Ht 61.0 in | Wt 195.0 lb

## 2023-12-10 DIAGNOSIS — I471 Supraventricular tachycardia, unspecified: Secondary | ICD-10-CM | POA: Diagnosis not present

## 2023-12-10 DIAGNOSIS — G459 Transient cerebral ischemic attack, unspecified: Secondary | ICD-10-CM | POA: Insufficient documentation

## 2023-12-10 NOTE — Progress Notes (Signed)
  Electrophysiology Office Note:   Date:  12/10/2023  ID:  Frederich Chick Featherly, DOB [birth year], MRN [id number]  Primary Cardiologist: None Primary Heart Failure: None Electrophysiologist: None      History of Present Illness:   Tiffany Golden is a 48 y.o. female with h/o SVT, HIV, end-stage renal disease on dialysis, CVA seen today for routine electrophysiology followup.   Since last being seen in our clinic the patient reports doing well.  She has had no further episodes of SVT.  She had a prior episode when she was undergoing dialysis.  She is still having dialysis, but has had no further episodes of SVT.  She is happy with her control.  She has not had to take as needed diltiazem.  she denies chest pain, palpitations, dyspnea, PND, orthopnea, nausea, vomiting, dizziness, syncope, edema, weight gain, or early satiety.   Review of systems complete and found to be negative unless listed in HPI.   EP Information / Studies Reviewed:    EKG is ordered today. Personal review as below.  EKG Interpretation Date/Time:  Tuesday December 10 2023 10:41:55 EST Ventricular Rate:  70 PR Interval:  140 QRS Duration:  82 QT Interval:  378 QTC Calculation: 408 R Axis:   8  Text Interpretation: Normal sinus rhythm Low voltage QRS Cannot rule out Anterior infarct , age undetermined When compared with ECG of 14-Dec-2020 15:48, PREVIOUS ECG IS PRESENT Confirmed by [employer], Terianne Thaker ([id number]) on 12/10/2023 10:44:19 AM     Risk Assessment/Calculations:              Physical Exam:   VS:  BP 118/76 (BP Location: Right Arm, Patient Position: Sitting, Cuff Size: Large)   Pulse 70   Ht 5\' 1"  (1.549 m)   Wt 195 lb (88.5 kg)   SpO2 97%   BMI 36.84 kg/m    Wt Readings from Last 3 Encounters:  12/10/23 195 lb (88.5 kg)  02/05/23 196 lb (88.9 kg)  01/16/22 191 lb 12.8 oz (87 kg)     GEN: Well nourished, well developed in no acute distress NECK: No JVD; No carotid bruits CARDIAC: Regular rate and rhythm, no  murmurs, rubs, gallops RESPIRATORY:  Clear to auscultation without rales, wheezing or rhonchi  ABDOMEN: Soft, non-tender, non-distended EXTREMITIES:  No edema; No deformity   ASSESSMENT AND PLAN:    1.  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  Currently on as needed diltiazem.  She has had no further episodes of SVT.  She is happy with her control.  Jamail Cullers continue with current management.  As she has not had any further episodes, we Meagen Limones see her back as needed.  She Vivek Grealish call us back if she has any more SVT.  Follow up with Dr. Elberta Fortis  PRN   Signed, Alajia Schmelzer Jorja Loa, MD

## 2023-12-10 NOTE — Telephone Encounter (Signed)
[facility] Patient Advocate Encounter   I was successful in securing patient a $5000.00 grant from [employer] (PAF) to provide copayment coverage for Biktarvy.  This will make the out of pocket cost $0.00.     I have spoken with the patient.    The billing information is as follows and has been shared with [facility].         Patient knows to call the office with questions or concerns.  Clearance Coots, CPhT Specialty Pharmacy Patient [facility] Phone: [phone] Fax:  [phone]

## 2023-12-11 DIAGNOSIS — N2581 Secondary hyperparathyroidism of renal origin: Secondary | ICD-10-CM | POA: Diagnosis not present

## 2023-12-11 DIAGNOSIS — D631 Anemia in chronic kidney disease: Secondary | ICD-10-CM | POA: Diagnosis not present

## 2023-12-11 DIAGNOSIS — Z992 Dependence on renal dialysis: Secondary | ICD-10-CM | POA: Diagnosis not present

## 2023-12-11 DIAGNOSIS — N186 End stage renal disease: Secondary | ICD-10-CM | POA: Diagnosis not present

## 2023-12-13 DIAGNOSIS — N2581 Secondary hyperparathyroidism of renal origin: Secondary | ICD-10-CM | POA: Diagnosis not present

## 2023-12-13 DIAGNOSIS — N186 End stage renal disease: Secondary | ICD-10-CM | POA: Diagnosis not present

## 2023-12-13 DIAGNOSIS — Z992 Dependence on renal dialysis: Secondary | ICD-10-CM | POA: Diagnosis not present

## 2023-12-13 DIAGNOSIS — D631 Anemia in chronic kidney disease: Secondary | ICD-10-CM | POA: Diagnosis not present

## 2023-12-14 DIAGNOSIS — Z992 Dependence on renal dialysis: Secondary | ICD-10-CM | POA: Diagnosis not present

## 2023-12-14 DIAGNOSIS — N186 End stage renal disease: Secondary | ICD-10-CM | POA: Diagnosis not present

## 2023-12-14 DIAGNOSIS — I120 Hypertensive chronic kidney disease with stage 5 chronic kidney disease or end stage renal disease: Secondary | ICD-10-CM | POA: Diagnosis not present

## 2023-12-16 DIAGNOSIS — N2581 Secondary hyperparathyroidism of renal origin: Secondary | ICD-10-CM | POA: Diagnosis not present

## 2023-12-16 DIAGNOSIS — D631 Anemia in chronic kidney disease: Secondary | ICD-10-CM | POA: Diagnosis not present

## 2023-12-16 DIAGNOSIS — N186 End stage renal disease: Secondary | ICD-10-CM | POA: Diagnosis not present

## 2023-12-16 DIAGNOSIS — Z992 Dependence on renal dialysis: Secondary | ICD-10-CM | POA: Diagnosis not present

## 2023-12-16 NOTE — Progress Notes (Signed)
 Chief complaint follow-up for HIV disease on medications  Subjective:    Patient ID: Tiffany Golden, female    DOB: 1976-03-24, 48 y.o.   MRN: [id number]  HPI  Discussed the use of AI scribe software for clinical note transcription with the patient, who gave verbal consent to proceed.  History of Present Illness   The patient, with a history of AV nodal reentry tachycardia, is on diltiazem  as needed. The patient reports no recent episodes of tachycardia and continues to see her cardiologist regularly. The patient is also on the waiting list for a transplant and is currently on Biktarvy  for her HIV and a low dose of Crestor . The patient has received a COVID shot and a flu shot, but expresses some hesitation about the COVID shot due to concerns about its effects. The patient is seen once a year, but the doctor suggests seeing the patient every ten months due to the [employer]' requirements.       Past Medical History:  Diagnosis Date   Anemia    Chronic kidney disease    Complication of anesthesia    Dialysis patient (HCC)    mon, wed friday   ESRD (end stage renal disease) (HCC)    HIV infection (HCC)    Hyperlipidemia 02/05/2023   Hypertension    PONV (postoperative nausea and vomiting)    Stroke (HCC)    TIA (transient ischemic attack)    Hx:of    Past Surgical History:  Procedure Laterality Date   ARTERIOVENOUS GRAFT PLACEMENT  09/11/2011   left arm   CESAREAN SECTION     CESAREAN SECTION WITH BILATERAL TUBAL LIGATION  02/29/1996   DILITATION & CURRETTAGE/HYSTROSCOPY WITH NOVASURE ABLATION N/A 05/03/2015   Procedure: DILATATION/HYSTEROSCOPY WITH NOVASURE ABLATION; uterine cavity length 5.0 cm, uterine cavity width 3.8 cm, power 105 watts; time 1 minute 12 seconds;  Surgeon: Norleen Edsel GAILS, MD;  Location: [facility];  Service: Gynecology;  Laterality: N/A;   FISTULA SUPERFICIALIZATION Left 02/23/2021   Procedure: PLICATION OF LEFT ARM FISTULA;  Surgeon: Magda Debby SAILOR, MD;  Location: [facility] OR;  Service: Vascular;  Laterality: Left;   INSERTION OF DIALYSIS CATHETER Right 09/01/2013   Procedure: INSERTION OF DIALYSIS CATHETER Right Internal Jugular;  Surgeon: Carlin FORBES Haddock, MD;  Location: [facility] OR;  Service: Vascular;  Laterality: Right;   PATCH ANGIOPLASTY Left 09/01/2013   Procedure: PATCH ANGIOPLASTY;  Surgeon: Carlin FORBES Haddock, MD;  Location: [facility] OR;  Service: Vascular;  Laterality: Left;   REVISON OF ARTERIOVENOUS FISTULA Left 09/01/2013   Procedure: Plication left arm fistula;  Surgeon: Carlin FORBES Haddock, MD;  Location: [facility] OR;  Service: Vascular;  Laterality: Left;    Family History  Problem Relation Age of Onset   Hyperlipidemia Mother    Cancer - Other Mother        Hx partial hysterectomy   Heart disease Father        Hx CABG      Social History   Socioeconomic History   Marital status: Single    Spouse name: Not on file   Number of children: Not on file   Years of education: Not on file   Highest education level: Not on file  Occupational History   Not on file  Tobacco Use   Smoking status: Former    Current packs/day: 0.00    Average packs/day: 0.5 packs/day for 3.0 years (1.5 ttl pk-yrs)    Types: Cigarettes    Start date:  09/05/2006    Quit date: 09/05/2009    Years since quitting: 14.2   Smokeless tobacco: Never  Vaping Use   Vaping status: Never Used  Substance and Sexual Activity   Alcohol use: No   Drug use: No   Sexual activity: Not Currently    Partners: Male    Birth control/protection: Abstinence    Comment: declined condoms  Other Topics Concern   Not on file  Social History Narrative   Not on file   Social Drivers of Health   Financial Resource Strain: Low Risk  (03/30/2021)   Overall Financial Resource Strain (CARDIA)    Difficulty of Paying Living Expenses: Not hard at all  Food Insecurity: No Food Insecurity (03/30/2021)   Hunger Vital Sign    Worried About Running Out of Food in the Last Year: Never  true    Ran Out of Food in the Last Year: Never true  Transportation Needs: No Transportation Needs (03/30/2021)   PRAPARE - Administrator, Civil Service (Medical): No    Lack of Transportation (Non-Medical): No  Physical Activity: Insufficiently Active (03/30/2021)   Exercise Vital Sign    Days of Exercise per Week: 4 days    Minutes of Exercise per Session: 30 min  Stress: Stress Concern Present (03/30/2021)   [employer] of Occupational Health - Occupational Stress Questionnaire    Feeling of Stress : To some extent  Social Connections: Moderately Integrated (03/30/2021)   Social Connection and Isolation Panel [NHANES]    Frequency of Communication with Friends and Family: More than three times a week    Frequency of Social Gatherings with Friends and Family: Once a week    Attends Religious Services: 1 to 4 times per year    Active Member of [employer] or Organizations: Yes    Attends Engineer, Structural: More than 4 times per year    Marital Status: Never married    Allergies  Allergen Reactions   Fortaz [Ceftazidime Sodium In Dextrose] Rash    Head-toe   Sulfa Antibiotics Hives   Vancomycin Rash    Head-toe     Current Outpatient Medications:    B Complex-C-Folic Acid  (RENA-VITE RX) 1 MG TABS, Take 1 tablet by mouth daily., Disp: , Rfl:    bictegravir-emtricitabine -tenofovir  AF (BIKTARVY ) 50-200-25 MG TABS tablet, Take 1 tablet by mouth daily., Disp: 30 tablet, Rfl: 3   calcium  carbonate (TUMS - DOSED IN MG ELEMENTAL CALCIUM ) 500 MG chewable tablet, Chew 1-2 tablets by mouth daily as needed for indigestion or heartburn., Disp: , Rfl:    diltiazem  (CARDIZEM ) 30 MG tablet, Take 1 tablet (30 mg total) by mouth every 6 (six) hours as needed (for elevated heart rates)., Disp: 30 tablet, Rfl: 1   hydrOXYzine  (ATARAX /VISTARIL ) 10 MG tablet, Take 1 tablet (10 mg total) by mouth every 8 (eight) hours as needed., Disp: 60 tablet, Rfl: 12   metoprolol  tartrate  (LOPRESSOR ) 25 MG tablet, Take 12.5 mg by mouth 2 (two) times daily as needed (for blood pressure). Except takes none on dialysis days (Tuesday, Thursday, and Sunday)., Disp: , Rfl:    rosuvastatin  (CRESTOR ) 5 MG tablet, Take 1 tablet (5 mg total) by mouth daily., Disp: 30 tablet, Rfl: 11   sertraline  (ZOLOFT ) 100 MG tablet, Take 1 tablet (100 mg total) by mouth daily., Disp: 30 tablet, Rfl: 5   Review of Systems  Constitutional:  Negative for activity change, appetite change, chills, diaphoresis, fatigue, fever and unexpected weight change.  HENT:  Negative for congestion, rhinorrhea, sinus pressure, sneezing, sore throat and trouble swallowing.   Eyes:  Negative for photophobia and visual disturbance.  Respiratory:  Negative for cough, chest tightness, shortness of breath, wheezing and stridor.   Cardiovascular:  Negative for chest pain, palpitations and leg swelling.  Gastrointestinal:  Negative for abdominal distention, abdominal pain, anal bleeding, blood in stool, constipation, diarrhea, nausea and vomiting.  Genitourinary:  Negative for difficulty urinating, dysuria, flank pain and hematuria.  Musculoskeletal:  Negative for arthralgias, back pain, gait problem, joint swelling and myalgias.  Skin:  Negative for color change, pallor, rash and wound.  Neurological:  Negative for dizziness, tremors, weakness and light-headedness.  Hematological:  Negative for adenopathy. Does not bruise/bleed easily.  Psychiatric/Behavioral:  Negative for agitation, behavioral problems, confusion, decreased concentration, dysphoric mood and sleep disturbance.        Objective:   Physical Exam Constitutional:      General: She is not in acute distress.    Appearance: Normal appearance. She is well-developed. She is not ill-appearing or diaphoretic.  HENT:     Head: Normocephalic and atraumatic.     Right Ear: Hearing and external ear normal.     Left Ear: Hearing and external ear normal.     Nose: No  nasal deformity or rhinorrhea.  Eyes:     General: No scleral icterus.    Conjunctiva/sclera: Conjunctivae normal.     Right eye: Right conjunctiva is not injected.     Left eye: Left conjunctiva is not injected.     Pupils: Pupils are equal, round, and reactive to light.  Neck:     Vascular: No JVD.  Cardiovascular:     Rate and Rhythm: Normal rate and regular rhythm.     Heart sounds: S1 normal and S2 normal.  Pulmonary:     Effort: Pulmonary effort is normal. No respiratory distress.     Breath sounds: No wheezing.  Abdominal:     General: Bowel sounds are normal. There is no distension.     Palpations: Abdomen is soft.     Tenderness: There is no abdominal tenderness.  Musculoskeletal:        General: Normal range of motion.     Right shoulder: Normal.     Left shoulder: Normal.     Cervical back: Normal range of motion and neck supple.     Right hip: Normal.     Left hip: Normal.     Right knee: Normal.     Left knee: Normal.  Lymphadenopathy:     Head:     Right side of head: No submandibular, preauricular or posterior auricular adenopathy.     Left side of head: No submandibular, preauricular or posterior auricular adenopathy.     Cervical: No cervical adenopathy.     Right cervical: No superficial or deep cervical adenopathy.    Left cervical: No superficial or deep cervical adenopathy.  Skin:    General: Skin is warm and dry.     Coloration: Skin is not pale.     Findings: No abrasion, bruising, ecchymosis, erythema, lesion or rash.     Nails: There is no clubbing.  Neurological:     Mental Status: She is alert and oriented to person, place, and time.     Sensory: No sensory deficit.     Coordination: Coordination normal.     Gait: Gait normal.  Psychiatric:        Attention and Perception: She is attentive.  Speech: Speech normal.        Behavior: Behavior normal. Behavior is cooperative.        Thought Content: Thought content normal.         Judgment: Judgment normal.           Assessment & Plan:   Assessment and Plan    AV Nodal Reentry Tachycardia Stable, no recent episodes. Continues to see cardiologist regularly. On as-needed Diltiazem . -Continue current management plan.  Potential Transplant Patient on waiting list for transplant at [facility]. No current updates on status. -Continue to monitor status.  COVID-19 Vaccination Patient initially hesitant about receiving booster shot. Discussed safety and importance, especially in the context of potential transplant. -Administer COVID-19 booster shot.  Hyperlipidemia Patient on low-dose Crestor . -Continue Crestor .  HIV Patient on Biktarvy , approved by transplant team at [facility]. -Continue Biktarvy  --check HIV RNA quant, CD4 etc      ESRD on HD still

## 2023-12-17 ENCOUNTER — Encounter: Payer: Self-pay | Admitting: Infectious Disease

## 2023-12-17 ENCOUNTER — Other Ambulatory Visit (HOSPITAL_COMMUNITY): Payer: Self-pay

## 2023-12-17 ENCOUNTER — Other Ambulatory Visit: Payer: Self-pay

## 2023-12-17 ENCOUNTER — Ambulatory Visit (INDEPENDENT_AMBULATORY_CARE_PROVIDER_SITE_OTHER): Payer: Medicare Other | Admitting: Infectious Disease

## 2023-12-17 VITALS — BP 147/109 | HR 79 | Temp 98.5°F | Ht 61.0 in | Wt 195.0 lb

## 2023-12-17 DIAGNOSIS — E785 Hyperlipidemia, unspecified: Secondary | ICD-10-CM

## 2023-12-17 DIAGNOSIS — N186 End stage renal disease: Secondary | ICD-10-CM | POA: Diagnosis not present

## 2023-12-17 DIAGNOSIS — Z7185 Encounter for immunization safety counseling: Secondary | ICD-10-CM

## 2023-12-17 DIAGNOSIS — B20 Human immunodeficiency virus [HIV] disease: Secondary | ICD-10-CM

## 2023-12-17 DIAGNOSIS — Z992 Dependence on renal dialysis: Secondary | ICD-10-CM

## 2023-12-17 DIAGNOSIS — E789 Disorder of lipoprotein metabolism, unspecified: Secondary | ICD-10-CM | POA: Diagnosis not present

## 2023-12-17 MED ORDER — ROSUVASTATIN CALCIUM 5 MG PO TABS
5.0000 mg | ORAL_TABLET | Freq: Every day | ORAL | 11 refills | Status: AC
  Filled 2023-12-17: qty 30, 30d supply, fill #0

## 2023-12-17 MED ORDER — BIKTARVY 50-200-25 MG PO TABS
1.0000 | ORAL_TABLET | Freq: Every day | ORAL | 11 refills | Status: AC
  Filled 2023-12-17 – 2024-01-01 (×2): qty 30, 30d supply, fill #0
  Filled 2024-01-30: qty 30, 30d supply, fill #1
  Filled 2024-03-03: qty 30, 30d supply, fill #2
  Filled 2024-04-01: qty 30, 30d supply, fill #3
  Filled 2024-05-04: qty 30, 30d supply, fill #4
  Filled 2024-06-01: qty 30, 30d supply, fill #5
  Filled 2024-06-24 (×2): qty 30, 30d supply, fill #6
  Filled 2024-07-24: qty 30, 30d supply, fill #7
  Filled 2024-08-25 – 2024-09-02 (×3): qty 30, 30d supply, fill #8
  Filled 2024-09-28: qty 30, 30d supply, fill #9
  Filled 2024-10-22: qty 30, 30d supply, fill #10
  Filled 2024-11-19 – 2024-12-03 (×4): qty 30, 30d supply, fill #11

## 2023-12-18 DIAGNOSIS — N2581 Secondary hyperparathyroidism of renal origin: Secondary | ICD-10-CM | POA: Diagnosis not present

## 2023-12-18 DIAGNOSIS — N186 End stage renal disease: Secondary | ICD-10-CM | POA: Diagnosis not present

## 2023-12-18 DIAGNOSIS — Z992 Dependence on renal dialysis: Secondary | ICD-10-CM | POA: Diagnosis not present

## 2023-12-18 DIAGNOSIS — D631 Anemia in chronic kidney disease: Secondary | ICD-10-CM | POA: Diagnosis not present

## 2023-12-19 LAB — T-HELPER CELLS (CD4) COUNT (NOT AT ARMC)
CD4 % Helper T Cell: 37 % (ref 33–65)
CD4 T Cell Abs: 740 /uL (ref 400–1790)

## 2023-12-20 DIAGNOSIS — D631 Anemia in chronic kidney disease: Secondary | ICD-10-CM | POA: Diagnosis not present

## 2023-12-20 DIAGNOSIS — Z992 Dependence on renal dialysis: Secondary | ICD-10-CM | POA: Diagnosis not present

## 2023-12-20 DIAGNOSIS — N2581 Secondary hyperparathyroidism of renal origin: Secondary | ICD-10-CM | POA: Diagnosis not present

## 2023-12-20 DIAGNOSIS — N186 End stage renal disease: Secondary | ICD-10-CM | POA: Diagnosis not present

## 2023-12-20 LAB — LIPID PANEL
Cholesterol: 265 mg/dL — ABNORMAL HIGH (ref <200)
HDL: 62 mg/dL (ref >=50)
LDL Cholesterol (Calc): 176 mg/dL — ABNORMAL HIGH
Non-HDL Cholesterol (Calc): 203 mg/dL — ABNORMAL HIGH (ref <130)
Total CHOL/HDL Ratio: 4.3 (calc) (ref <5.0)
Triglycerides: 130 mg/dL (ref <150)

## 2023-12-20 LAB — HIV RNA, RTPCR W/R GT (RTI, PI,INT)
HIV 1 RNA Quant: NOT DETECTED {copies}/mL
HIV-1 RNA Quant, Log: NOT DETECTED {Log}

## 2023-12-20 LAB — CBC WITH DIFFERENTIAL/PLATELET
Absolute Lymphocytes: 2305 {cells}/uL (ref 850–3900)
Absolute Monocytes: 380 {cells}/uL (ref 200–950)
Basophils Absolute: 90 {cells}/uL (ref 0–200)
Basophils Relative: 1.3 %
Eosinophils Absolute: 152 {cells}/uL (ref 15–500)
Eosinophils Relative: 2.2 %
HCT: 36.3 % (ref 35.0–45.0)
Hemoglobin: 12.3 g/dL (ref 11.7–15.5)
MCH: 35 pg — ABNORMAL HIGH (ref 27.0–33.0)
MCHC: 33.9 g/dL (ref 32.0–36.0)
MCV: 103.4 fL — ABNORMAL HIGH (ref 80.0–100.0)
MPV: 9.5 fL (ref 7.5–12.5)
Monocytes Relative: 5.5 %
Neutro Abs: 3974 {cells}/uL (ref 1500–7800)
Neutrophils Relative %: 57.6 %
Platelets: 249 10*3/uL (ref 140–400)
RBC: 3.51 10*6/uL — ABNORMAL LOW (ref 3.80–5.10)
RDW: 12.8 % (ref 11.0–15.0)
Total Lymphocyte: 33.4 %
WBC: 6.9 10*3/uL (ref 3.8–10.8)

## 2023-12-20 LAB — RPR: RPR Ser Ql: NONREACTIVE

## 2023-12-23 DIAGNOSIS — N2581 Secondary hyperparathyroidism of renal origin: Secondary | ICD-10-CM | POA: Diagnosis not present

## 2023-12-23 DIAGNOSIS — Z992 Dependence on renal dialysis: Secondary | ICD-10-CM | POA: Diagnosis not present

## 2023-12-23 DIAGNOSIS — N186 End stage renal disease: Secondary | ICD-10-CM | POA: Diagnosis not present

## 2023-12-23 DIAGNOSIS — D631 Anemia in chronic kidney disease: Secondary | ICD-10-CM | POA: Diagnosis not present

## 2023-12-25 DIAGNOSIS — N186 End stage renal disease: Secondary | ICD-10-CM | POA: Diagnosis not present

## 2023-12-25 DIAGNOSIS — N2581 Secondary hyperparathyroidism of renal origin: Secondary | ICD-10-CM | POA: Diagnosis not present

## 2023-12-25 DIAGNOSIS — D631 Anemia in chronic kidney disease: Secondary | ICD-10-CM | POA: Diagnosis not present

## 2023-12-25 DIAGNOSIS — Z992 Dependence on renal dialysis: Secondary | ICD-10-CM | POA: Diagnosis not present

## 2023-12-27 DIAGNOSIS — D631 Anemia in chronic kidney disease: Secondary | ICD-10-CM | POA: Diagnosis not present

## 2023-12-27 DIAGNOSIS — N186 End stage renal disease: Secondary | ICD-10-CM | POA: Diagnosis not present

## 2023-12-27 DIAGNOSIS — N2581 Secondary hyperparathyroidism of renal origin: Secondary | ICD-10-CM | POA: Diagnosis not present

## 2023-12-27 DIAGNOSIS — Z992 Dependence on renal dialysis: Secondary | ICD-10-CM | POA: Diagnosis not present

## 2023-12-30 ENCOUNTER — Other Ambulatory Visit: Payer: Self-pay

## 2023-12-30 DIAGNOSIS — D631 Anemia in chronic kidney disease: Secondary | ICD-10-CM | POA: Diagnosis not present

## 2023-12-30 DIAGNOSIS — N186 End stage renal disease: Secondary | ICD-10-CM | POA: Diagnosis not present

## 2023-12-30 DIAGNOSIS — N2581 Secondary hyperparathyroidism of renal origin: Secondary | ICD-10-CM | POA: Diagnosis not present

## 2023-12-30 DIAGNOSIS — Z992 Dependence on renal dialysis: Secondary | ICD-10-CM | POA: Diagnosis not present

## 2023-12-31 ENCOUNTER — Other Ambulatory Visit (HOSPITAL_COMMUNITY): Payer: Self-pay

## 2024-01-01 ENCOUNTER — Other Ambulatory Visit: Payer: Self-pay

## 2024-01-01 DIAGNOSIS — N2581 Secondary hyperparathyroidism of renal origin: Secondary | ICD-10-CM | POA: Diagnosis not present

## 2024-01-01 DIAGNOSIS — D631 Anemia in chronic kidney disease: Secondary | ICD-10-CM | POA: Diagnosis not present

## 2024-01-01 DIAGNOSIS — Z992 Dependence on renal dialysis: Secondary | ICD-10-CM | POA: Diagnosis not present

## 2024-01-01 DIAGNOSIS — N186 End stage renal disease: Secondary | ICD-10-CM | POA: Diagnosis not present

## 2024-01-01 NOTE — Progress Notes (Signed)
Specialty Pharmacy Ongoing Clinical Assessment Note  Tiffany Golden is a 48 y.o. female who is being followed by the specialty pharmacy service for RxSp HIV   Patient's specialty medication(s) reviewed today: Bictegravir-Emtricitab-Tenofov (Biktarvy)   Missed doses in the last 4 weeks: 0   Patient/Caregiver did not have any additional questions or concerns.   Therapeutic benefit summary: Patient is achieving benefit (12/17/23 HIV RNA Not Detected)   Adverse events/side effects summary: No adverse events/side effects   Patient's therapy is appropriate to: Continue    Goals Addressed             This Visit's Progress    Achieve Undetectable HIV Viral Load < 20       Patient is on track. Patient will maintain adherence         Follow up:  6 months  Bobette Mo Specialty Pharmacist

## 2024-01-01 NOTE — Progress Notes (Signed)
Specialty Pharmacy Refill Coordination Note  Tiffany Golden is a 48 y.o. female contacted today regarding refills of specialty medication(s) Bictegravir-Emtricitab-Tenofov [name])   Patient requested Delivery   Delivery date: 01/09/24   Verified address: [address]   Medication will be filled on 01/08/24.

## 2024-01-03 DIAGNOSIS — Z992 Dependence on renal dialysis: Secondary | ICD-10-CM | POA: Diagnosis not present

## 2024-01-03 DIAGNOSIS — N186 End stage renal disease: Secondary | ICD-10-CM | POA: Diagnosis not present

## 2024-01-03 DIAGNOSIS — N2581 Secondary hyperparathyroidism of renal origin: Secondary | ICD-10-CM | POA: Diagnosis not present

## 2024-01-03 DIAGNOSIS — D631 Anemia in chronic kidney disease: Secondary | ICD-10-CM | POA: Diagnosis not present

## 2024-01-06 DIAGNOSIS — N2581 Secondary hyperparathyroidism of renal origin: Secondary | ICD-10-CM | POA: Diagnosis not present

## 2024-01-06 DIAGNOSIS — D631 Anemia in chronic kidney disease: Secondary | ICD-10-CM | POA: Diagnosis not present

## 2024-01-06 DIAGNOSIS — N186 End stage renal disease: Secondary | ICD-10-CM | POA: Diagnosis not present

## 2024-01-06 DIAGNOSIS — Z992 Dependence on renal dialysis: Secondary | ICD-10-CM | POA: Diagnosis not present

## 2024-01-08 ENCOUNTER — Other Ambulatory Visit: Payer: Self-pay

## 2024-01-08 DIAGNOSIS — D631 Anemia in chronic kidney disease: Secondary | ICD-10-CM | POA: Diagnosis not present

## 2024-01-08 DIAGNOSIS — Z992 Dependence on renal dialysis: Secondary | ICD-10-CM | POA: Diagnosis not present

## 2024-01-08 DIAGNOSIS — N186 End stage renal disease: Secondary | ICD-10-CM | POA: Diagnosis not present

## 2024-01-08 DIAGNOSIS — N2581 Secondary hyperparathyroidism of renal origin: Secondary | ICD-10-CM | POA: Diagnosis not present

## 2024-01-10 DIAGNOSIS — Z992 Dependence on renal dialysis: Secondary | ICD-10-CM | POA: Diagnosis not present

## 2024-01-10 DIAGNOSIS — D631 Anemia in chronic kidney disease: Secondary | ICD-10-CM | POA: Diagnosis not present

## 2024-01-10 DIAGNOSIS — N2581 Secondary hyperparathyroidism of renal origin: Secondary | ICD-10-CM | POA: Diagnosis not present

## 2024-01-10 DIAGNOSIS — N186 End stage renal disease: Secondary | ICD-10-CM | POA: Diagnosis not present

## 2024-01-11 DIAGNOSIS — Z992 Dependence on renal dialysis: Secondary | ICD-10-CM | POA: Diagnosis not present

## 2024-01-11 DIAGNOSIS — I120 Hypertensive chronic kidney disease with stage 5 chronic kidney disease or end stage renal disease: Secondary | ICD-10-CM | POA: Diagnosis not present

## 2024-01-11 DIAGNOSIS — N186 End stage renal disease: Secondary | ICD-10-CM | POA: Diagnosis not present

## 2024-01-13 DIAGNOSIS — N186 End stage renal disease: Secondary | ICD-10-CM | POA: Diagnosis not present

## 2024-01-13 DIAGNOSIS — Z992 Dependence on renal dialysis: Secondary | ICD-10-CM | POA: Diagnosis not present

## 2024-01-13 DIAGNOSIS — N2581 Secondary hyperparathyroidism of renal origin: Secondary | ICD-10-CM | POA: Diagnosis not present

## 2024-01-13 DIAGNOSIS — D631 Anemia in chronic kidney disease: Secondary | ICD-10-CM | POA: Diagnosis not present

## 2024-01-15 DIAGNOSIS — N186 End stage renal disease: Secondary | ICD-10-CM | POA: Diagnosis not present

## 2024-01-15 DIAGNOSIS — N2581 Secondary hyperparathyroidism of renal origin: Secondary | ICD-10-CM | POA: Diagnosis not present

## 2024-01-15 DIAGNOSIS — Z992 Dependence on renal dialysis: Secondary | ICD-10-CM | POA: Diagnosis not present

## 2024-01-15 DIAGNOSIS — D631 Anemia in chronic kidney disease: Secondary | ICD-10-CM | POA: Diagnosis not present

## 2024-01-17 DIAGNOSIS — N2581 Secondary hyperparathyroidism of renal origin: Secondary | ICD-10-CM | POA: Diagnosis not present

## 2024-01-17 DIAGNOSIS — N186 End stage renal disease: Secondary | ICD-10-CM | POA: Diagnosis not present

## 2024-01-17 DIAGNOSIS — D631 Anemia in chronic kidney disease: Secondary | ICD-10-CM | POA: Diagnosis not present

## 2024-01-17 DIAGNOSIS — Z992 Dependence on renal dialysis: Secondary | ICD-10-CM | POA: Diagnosis not present

## 2024-01-20 DIAGNOSIS — N186 End stage renal disease: Secondary | ICD-10-CM | POA: Diagnosis not present

## 2024-01-20 DIAGNOSIS — Z992 Dependence on renal dialysis: Secondary | ICD-10-CM | POA: Diagnosis not present

## 2024-01-20 DIAGNOSIS — D631 Anemia in chronic kidney disease: Secondary | ICD-10-CM | POA: Diagnosis not present

## 2024-01-20 DIAGNOSIS — N2581 Secondary hyperparathyroidism of renal origin: Secondary | ICD-10-CM | POA: Diagnosis not present

## 2024-01-22 DIAGNOSIS — N2581 Secondary hyperparathyroidism of renal origin: Secondary | ICD-10-CM | POA: Diagnosis not present

## 2024-01-22 DIAGNOSIS — Z992 Dependence on renal dialysis: Secondary | ICD-10-CM | POA: Diagnosis not present

## 2024-01-22 DIAGNOSIS — N186 End stage renal disease: Secondary | ICD-10-CM | POA: Diagnosis not present

## 2024-01-22 DIAGNOSIS — D631 Anemia in chronic kidney disease: Secondary | ICD-10-CM | POA: Diagnosis not present

## 2024-01-24 DIAGNOSIS — N186 End stage renal disease: Secondary | ICD-10-CM | POA: Diagnosis not present

## 2024-01-24 DIAGNOSIS — N2581 Secondary hyperparathyroidism of renal origin: Secondary | ICD-10-CM | POA: Diagnosis not present

## 2024-01-24 DIAGNOSIS — Z992 Dependence on renal dialysis: Secondary | ICD-10-CM | POA: Diagnosis not present

## 2024-01-24 DIAGNOSIS — D631 Anemia in chronic kidney disease: Secondary | ICD-10-CM | POA: Diagnosis not present

## 2024-01-27 DIAGNOSIS — N186 End stage renal disease: Secondary | ICD-10-CM | POA: Diagnosis not present

## 2024-01-27 DIAGNOSIS — Z992 Dependence on renal dialysis: Secondary | ICD-10-CM | POA: Diagnosis not present

## 2024-01-27 DIAGNOSIS — N2581 Secondary hyperparathyroidism of renal origin: Secondary | ICD-10-CM | POA: Diagnosis not present

## 2024-01-27 DIAGNOSIS — D631 Anemia in chronic kidney disease: Secondary | ICD-10-CM | POA: Diagnosis not present

## 2024-01-29 DIAGNOSIS — Z992 Dependence on renal dialysis: Secondary | ICD-10-CM | POA: Diagnosis not present

## 2024-01-29 DIAGNOSIS — N2581 Secondary hyperparathyroidism of renal origin: Secondary | ICD-10-CM | POA: Diagnosis not present

## 2024-01-29 DIAGNOSIS — D631 Anemia in chronic kidney disease: Secondary | ICD-10-CM | POA: Diagnosis not present

## 2024-01-29 DIAGNOSIS — N186 End stage renal disease: Secondary | ICD-10-CM | POA: Diagnosis not present

## 2024-01-30 ENCOUNTER — Other Ambulatory Visit: Payer: Self-pay

## 2024-01-30 NOTE — Progress Notes (Signed)
 Specialty Pharmacy Refill Coordination Note  Tiffany Golden is a 48 y.o. female contacted today regarding refills of specialty medication(s) Bictegravir-Emtricitab-Tenofov Musician)   Patient requested (Patient-Rptd) Delivery   Delivery date: (Patient-Rptd) 02/06/24   Verified address: (Patient-Rptd) [address] [place], [place] [address]   Medication will be filled on 03.26.25.

## 2024-01-31 DIAGNOSIS — N2581 Secondary hyperparathyroidism of renal origin: Secondary | ICD-10-CM | POA: Diagnosis not present

## 2024-01-31 DIAGNOSIS — N186 End stage renal disease: Secondary | ICD-10-CM | POA: Diagnosis not present

## 2024-01-31 DIAGNOSIS — D631 Anemia in chronic kidney disease: Secondary | ICD-10-CM | POA: Diagnosis not present

## 2024-01-31 DIAGNOSIS — Z992 Dependence on renal dialysis: Secondary | ICD-10-CM | POA: Diagnosis not present

## 2024-02-03 DIAGNOSIS — D631 Anemia in chronic kidney disease: Secondary | ICD-10-CM | POA: Diagnosis not present

## 2024-02-03 DIAGNOSIS — N2581 Secondary hyperparathyroidism of renal origin: Secondary | ICD-10-CM | POA: Diagnosis not present

## 2024-02-03 DIAGNOSIS — Z992 Dependence on renal dialysis: Secondary | ICD-10-CM | POA: Diagnosis not present

## 2024-02-03 DIAGNOSIS — N186 End stage renal disease: Secondary | ICD-10-CM | POA: Diagnosis not present

## 2024-02-05 ENCOUNTER — Other Ambulatory Visit: Payer: Self-pay

## 2024-02-05 DIAGNOSIS — N186 End stage renal disease: Secondary | ICD-10-CM | POA: Diagnosis not present

## 2024-02-05 DIAGNOSIS — Z992 Dependence on renal dialysis: Secondary | ICD-10-CM | POA: Diagnosis not present

## 2024-02-05 DIAGNOSIS — D631 Anemia in chronic kidney disease: Secondary | ICD-10-CM | POA: Diagnosis not present

## 2024-02-05 DIAGNOSIS — N2581 Secondary hyperparathyroidism of renal origin: Secondary | ICD-10-CM | POA: Diagnosis not present

## 2024-02-07 DIAGNOSIS — N186 End stage renal disease: Secondary | ICD-10-CM | POA: Diagnosis not present

## 2024-02-07 DIAGNOSIS — D631 Anemia in chronic kidney disease: Secondary | ICD-10-CM | POA: Diagnosis not present

## 2024-02-07 DIAGNOSIS — Z992 Dependence on renal dialysis: Secondary | ICD-10-CM | POA: Diagnosis not present

## 2024-02-07 DIAGNOSIS — N2581 Secondary hyperparathyroidism of renal origin: Secondary | ICD-10-CM | POA: Diagnosis not present

## 2024-02-10 DIAGNOSIS — N186 End stage renal disease: Secondary | ICD-10-CM | POA: Diagnosis not present

## 2024-02-10 DIAGNOSIS — Z992 Dependence on renal dialysis: Secondary | ICD-10-CM | POA: Diagnosis not present

## 2024-02-10 DIAGNOSIS — N2581 Secondary hyperparathyroidism of renal origin: Secondary | ICD-10-CM | POA: Diagnosis not present

## 2024-02-10 DIAGNOSIS — D631 Anemia in chronic kidney disease: Secondary | ICD-10-CM | POA: Diagnosis not present

## 2024-02-11 DIAGNOSIS — N186 End stage renal disease: Secondary | ICD-10-CM | POA: Diagnosis not present

## 2024-02-11 DIAGNOSIS — Z992 Dependence on renal dialysis: Secondary | ICD-10-CM | POA: Diagnosis not present

## 2024-02-11 DIAGNOSIS — I120 Hypertensive chronic kidney disease with stage 5 chronic kidney disease or end stage renal disease: Secondary | ICD-10-CM | POA: Diagnosis not present

## 2024-02-12 DIAGNOSIS — Z992 Dependence on renal dialysis: Secondary | ICD-10-CM | POA: Diagnosis not present

## 2024-02-12 DIAGNOSIS — N2581 Secondary hyperparathyroidism of renal origin: Secondary | ICD-10-CM | POA: Diagnosis not present

## 2024-02-12 DIAGNOSIS — N186 End stage renal disease: Secondary | ICD-10-CM | POA: Diagnosis not present

## 2024-02-12 DIAGNOSIS — D631 Anemia in chronic kidney disease: Secondary | ICD-10-CM | POA: Diagnosis not present

## 2024-02-14 DIAGNOSIS — N2581 Secondary hyperparathyroidism of renal origin: Secondary | ICD-10-CM | POA: Diagnosis not present

## 2024-02-14 DIAGNOSIS — Z992 Dependence on renal dialysis: Secondary | ICD-10-CM | POA: Diagnosis not present

## 2024-02-14 DIAGNOSIS — N186 End stage renal disease: Secondary | ICD-10-CM | POA: Diagnosis not present

## 2024-02-14 DIAGNOSIS — D631 Anemia in chronic kidney disease: Secondary | ICD-10-CM | POA: Diagnosis not present

## 2024-02-17 DIAGNOSIS — D631 Anemia in chronic kidney disease: Secondary | ICD-10-CM | POA: Diagnosis not present

## 2024-02-17 DIAGNOSIS — N2581 Secondary hyperparathyroidism of renal origin: Secondary | ICD-10-CM | POA: Diagnosis not present

## 2024-02-17 DIAGNOSIS — Z992 Dependence on renal dialysis: Secondary | ICD-10-CM | POA: Diagnosis not present

## 2024-02-17 DIAGNOSIS — N186 End stage renal disease: Secondary | ICD-10-CM | POA: Diagnosis not present

## 2024-02-19 DIAGNOSIS — D631 Anemia in chronic kidney disease: Secondary | ICD-10-CM | POA: Diagnosis not present

## 2024-02-19 DIAGNOSIS — N2581 Secondary hyperparathyroidism of renal origin: Secondary | ICD-10-CM | POA: Diagnosis not present

## 2024-02-19 DIAGNOSIS — N186 End stage renal disease: Secondary | ICD-10-CM | POA: Diagnosis not present

## 2024-02-19 DIAGNOSIS — Z992 Dependence on renal dialysis: Secondary | ICD-10-CM | POA: Diagnosis not present

## 2024-02-21 DIAGNOSIS — Z992 Dependence on renal dialysis: Secondary | ICD-10-CM | POA: Diagnosis not present

## 2024-02-21 DIAGNOSIS — N186 End stage renal disease: Secondary | ICD-10-CM | POA: Diagnosis not present

## 2024-02-21 DIAGNOSIS — D631 Anemia in chronic kidney disease: Secondary | ICD-10-CM | POA: Diagnosis not present

## 2024-02-21 DIAGNOSIS — N2581 Secondary hyperparathyroidism of renal origin: Secondary | ICD-10-CM | POA: Diagnosis not present

## 2024-02-24 DIAGNOSIS — N186 End stage renal disease: Secondary | ICD-10-CM | POA: Diagnosis not present

## 2024-02-24 DIAGNOSIS — Z992 Dependence on renal dialysis: Secondary | ICD-10-CM | POA: Diagnosis not present

## 2024-02-24 DIAGNOSIS — D631 Anemia in chronic kidney disease: Secondary | ICD-10-CM | POA: Diagnosis not present

## 2024-02-24 DIAGNOSIS — N2581 Secondary hyperparathyroidism of renal origin: Secondary | ICD-10-CM | POA: Diagnosis not present

## 2024-02-26 DIAGNOSIS — D631 Anemia in chronic kidney disease: Secondary | ICD-10-CM | POA: Diagnosis not present

## 2024-02-26 DIAGNOSIS — N186 End stage renal disease: Secondary | ICD-10-CM | POA: Diagnosis not present

## 2024-02-26 DIAGNOSIS — N2581 Secondary hyperparathyroidism of renal origin: Secondary | ICD-10-CM | POA: Diagnosis not present

## 2024-02-26 DIAGNOSIS — Z992 Dependence on renal dialysis: Secondary | ICD-10-CM | POA: Diagnosis not present

## 2024-02-28 DIAGNOSIS — N2581 Secondary hyperparathyroidism of renal origin: Secondary | ICD-10-CM | POA: Diagnosis not present

## 2024-02-28 DIAGNOSIS — Z992 Dependence on renal dialysis: Secondary | ICD-10-CM | POA: Diagnosis not present

## 2024-02-28 DIAGNOSIS — N186 End stage renal disease: Secondary | ICD-10-CM | POA: Diagnosis not present

## 2024-02-28 DIAGNOSIS — D631 Anemia in chronic kidney disease: Secondary | ICD-10-CM | POA: Diagnosis not present

## 2024-03-02 ENCOUNTER — Other Ambulatory Visit: Payer: Self-pay

## 2024-03-02 DIAGNOSIS — Z992 Dependence on renal dialysis: Secondary | ICD-10-CM | POA: Diagnosis not present

## 2024-03-02 DIAGNOSIS — N186 End stage renal disease: Secondary | ICD-10-CM | POA: Diagnosis not present

## 2024-03-02 DIAGNOSIS — D631 Anemia in chronic kidney disease: Secondary | ICD-10-CM | POA: Diagnosis not present

## 2024-03-02 DIAGNOSIS — N2581 Secondary hyperparathyroidism of renal origin: Secondary | ICD-10-CM | POA: Diagnosis not present

## 2024-03-03 ENCOUNTER — Other Ambulatory Visit: Payer: Self-pay

## 2024-03-03 ENCOUNTER — Other Ambulatory Visit: Payer: Self-pay | Admitting: Pharmacy Technician

## 2024-03-03 NOTE — Progress Notes (Signed)
 Specialty Pharmacy Refill Coordination Note  Tiffany Golden is a 48 y.o. female contacted today regarding refills of specialty medication(s) No data recorded  Patient requested (Patient-Rptd) Delivery   Delivery date: (Patient-Rptd) 03/12/24   Verified address: (Patient-Rptd) [address], [place], [place], [address]   Medication will be filled on 03/11/24.

## 2024-03-04 DIAGNOSIS — N186 End stage renal disease: Secondary | ICD-10-CM | POA: Diagnosis not present

## 2024-03-04 DIAGNOSIS — D631 Anemia in chronic kidney disease: Secondary | ICD-10-CM | POA: Diagnosis not present

## 2024-03-04 DIAGNOSIS — N2581 Secondary hyperparathyroidism of renal origin: Secondary | ICD-10-CM | POA: Diagnosis not present

## 2024-03-04 DIAGNOSIS — Z992 Dependence on renal dialysis: Secondary | ICD-10-CM | POA: Diagnosis not present

## 2024-03-06 DIAGNOSIS — N2581 Secondary hyperparathyroidism of renal origin: Secondary | ICD-10-CM | POA: Diagnosis not present

## 2024-03-06 DIAGNOSIS — Z992 Dependence on renal dialysis: Secondary | ICD-10-CM | POA: Diagnosis not present

## 2024-03-06 DIAGNOSIS — D631 Anemia in chronic kidney disease: Secondary | ICD-10-CM | POA: Diagnosis not present

## 2024-03-06 DIAGNOSIS — N186 End stage renal disease: Secondary | ICD-10-CM | POA: Diagnosis not present

## 2024-03-09 DIAGNOSIS — N186 End stage renal disease: Secondary | ICD-10-CM | POA: Diagnosis not present

## 2024-03-09 DIAGNOSIS — D631 Anemia in chronic kidney disease: Secondary | ICD-10-CM | POA: Diagnosis not present

## 2024-03-09 DIAGNOSIS — Z992 Dependence on renal dialysis: Secondary | ICD-10-CM | POA: Diagnosis not present

## 2024-03-09 DIAGNOSIS — N2581 Secondary hyperparathyroidism of renal origin: Secondary | ICD-10-CM | POA: Diagnosis not present

## 2024-03-11 ENCOUNTER — Other Ambulatory Visit: Payer: Self-pay

## 2024-03-11 DIAGNOSIS — Z992 Dependence on renal dialysis: Secondary | ICD-10-CM | POA: Diagnosis not present

## 2024-03-11 DIAGNOSIS — D631 Anemia in chronic kidney disease: Secondary | ICD-10-CM | POA: Diagnosis not present

## 2024-03-11 DIAGNOSIS — N2581 Secondary hyperparathyroidism of renal origin: Secondary | ICD-10-CM | POA: Diagnosis not present

## 2024-03-11 DIAGNOSIS — N186 End stage renal disease: Secondary | ICD-10-CM | POA: Diagnosis not present

## 2024-03-12 DIAGNOSIS — Z992 Dependence on renal dialysis: Secondary | ICD-10-CM | POA: Diagnosis not present

## 2024-03-12 DIAGNOSIS — I120 Hypertensive chronic kidney disease with stage 5 chronic kidney disease or end stage renal disease: Secondary | ICD-10-CM | POA: Diagnosis not present

## 2024-03-12 DIAGNOSIS — N186 End stage renal disease: Secondary | ICD-10-CM | POA: Diagnosis not present

## 2024-03-13 DIAGNOSIS — Z992 Dependence on renal dialysis: Secondary | ICD-10-CM | POA: Diagnosis not present

## 2024-03-13 DIAGNOSIS — D631 Anemia in chronic kidney disease: Secondary | ICD-10-CM | POA: Diagnosis not present

## 2024-03-13 DIAGNOSIS — N186 End stage renal disease: Secondary | ICD-10-CM | POA: Diagnosis not present

## 2024-03-13 DIAGNOSIS — N2581 Secondary hyperparathyroidism of renal origin: Secondary | ICD-10-CM | POA: Diagnosis not present

## 2024-03-16 DIAGNOSIS — N2581 Secondary hyperparathyroidism of renal origin: Secondary | ICD-10-CM | POA: Diagnosis not present

## 2024-03-16 DIAGNOSIS — Z992 Dependence on renal dialysis: Secondary | ICD-10-CM | POA: Diagnosis not present

## 2024-03-16 DIAGNOSIS — D631 Anemia in chronic kidney disease: Secondary | ICD-10-CM | POA: Diagnosis not present

## 2024-03-16 DIAGNOSIS — N186 End stage renal disease: Secondary | ICD-10-CM | POA: Diagnosis not present

## 2024-03-18 DIAGNOSIS — Z992 Dependence on renal dialysis: Secondary | ICD-10-CM | POA: Diagnosis not present

## 2024-03-18 DIAGNOSIS — N2581 Secondary hyperparathyroidism of renal origin: Secondary | ICD-10-CM | POA: Diagnosis not present

## 2024-03-18 DIAGNOSIS — D631 Anemia in chronic kidney disease: Secondary | ICD-10-CM | POA: Diagnosis not present

## 2024-03-18 DIAGNOSIS — N186 End stage renal disease: Secondary | ICD-10-CM | POA: Diagnosis not present

## 2024-03-20 DIAGNOSIS — D631 Anemia in chronic kidney disease: Secondary | ICD-10-CM | POA: Diagnosis not present

## 2024-03-20 DIAGNOSIS — N186 End stage renal disease: Secondary | ICD-10-CM | POA: Diagnosis not present

## 2024-03-20 DIAGNOSIS — Z992 Dependence on renal dialysis: Secondary | ICD-10-CM | POA: Diagnosis not present

## 2024-03-20 DIAGNOSIS — N2581 Secondary hyperparathyroidism of renal origin: Secondary | ICD-10-CM | POA: Diagnosis not present

## 2024-03-23 DIAGNOSIS — N2581 Secondary hyperparathyroidism of renal origin: Secondary | ICD-10-CM | POA: Diagnosis not present

## 2024-03-23 DIAGNOSIS — N186 End stage renal disease: Secondary | ICD-10-CM | POA: Diagnosis not present

## 2024-03-23 DIAGNOSIS — D631 Anemia in chronic kidney disease: Secondary | ICD-10-CM | POA: Diagnosis not present

## 2024-03-23 DIAGNOSIS — Z992 Dependence on renal dialysis: Secondary | ICD-10-CM | POA: Diagnosis not present

## 2024-03-25 DIAGNOSIS — N186 End stage renal disease: Secondary | ICD-10-CM | POA: Diagnosis not present

## 2024-03-25 DIAGNOSIS — N2581 Secondary hyperparathyroidism of renal origin: Secondary | ICD-10-CM | POA: Diagnosis not present

## 2024-03-25 DIAGNOSIS — D631 Anemia in chronic kidney disease: Secondary | ICD-10-CM | POA: Diagnosis not present

## 2024-03-25 DIAGNOSIS — Z992 Dependence on renal dialysis: Secondary | ICD-10-CM | POA: Diagnosis not present

## 2024-03-27 DIAGNOSIS — N2581 Secondary hyperparathyroidism of renal origin: Secondary | ICD-10-CM | POA: Diagnosis not present

## 2024-03-27 DIAGNOSIS — D631 Anemia in chronic kidney disease: Secondary | ICD-10-CM | POA: Diagnosis not present

## 2024-03-27 DIAGNOSIS — N186 End stage renal disease: Secondary | ICD-10-CM | POA: Diagnosis not present

## 2024-03-27 DIAGNOSIS — Z992 Dependence on renal dialysis: Secondary | ICD-10-CM | POA: Diagnosis not present

## 2024-03-30 DIAGNOSIS — N2581 Secondary hyperparathyroidism of renal origin: Secondary | ICD-10-CM | POA: Diagnosis not present

## 2024-03-30 DIAGNOSIS — Z992 Dependence on renal dialysis: Secondary | ICD-10-CM | POA: Diagnosis not present

## 2024-03-30 DIAGNOSIS — D631 Anemia in chronic kidney disease: Secondary | ICD-10-CM | POA: Diagnosis not present

## 2024-03-30 DIAGNOSIS — N186 End stage renal disease: Secondary | ICD-10-CM | POA: Diagnosis not present

## 2024-03-31 ENCOUNTER — Other Ambulatory Visit: Payer: Self-pay

## 2024-04-01 ENCOUNTER — Other Ambulatory Visit: Payer: Self-pay

## 2024-04-01 DIAGNOSIS — D631 Anemia in chronic kidney disease: Secondary | ICD-10-CM | POA: Diagnosis not present

## 2024-04-01 DIAGNOSIS — N2581 Secondary hyperparathyroidism of renal origin: Secondary | ICD-10-CM | POA: Diagnosis not present

## 2024-04-01 DIAGNOSIS — Z992 Dependence on renal dialysis: Secondary | ICD-10-CM | POA: Diagnosis not present

## 2024-04-01 DIAGNOSIS — N186 End stage renal disease: Secondary | ICD-10-CM | POA: Diagnosis not present

## 2024-04-01 NOTE — Progress Notes (Signed)
 Specialty Pharmacy Refill Coordination Note  Tiffany Golden is a 48 y.o. female contacted today regarding refills of specialty medication(s) Bictegravir-Emtricitab-Tenofov (Biktarvy )   Patient requested (Patient-Rptd) Delivery   Delivery date: (Patient-Rptd) 04/07/24   Verified address: (Patient-Rptd) [address], [place], [place], [address]   Medication will be filled on 04/03/24.

## 2024-04-03 ENCOUNTER — Other Ambulatory Visit: Payer: Self-pay

## 2024-04-03 DIAGNOSIS — D631 Anemia in chronic kidney disease: Secondary | ICD-10-CM | POA: Diagnosis not present

## 2024-04-03 DIAGNOSIS — N2581 Secondary hyperparathyroidism of renal origin: Secondary | ICD-10-CM | POA: Diagnosis not present

## 2024-04-03 DIAGNOSIS — Z992 Dependence on renal dialysis: Secondary | ICD-10-CM | POA: Diagnosis not present

## 2024-04-03 DIAGNOSIS — N186 End stage renal disease: Secondary | ICD-10-CM | POA: Diagnosis not present

## 2024-04-03 NOTE — Progress Notes (Signed)
 Refill too soon - medication will mail 5.27, lvm for patient.

## 2024-04-06 DIAGNOSIS — N186 End stage renal disease: Secondary | ICD-10-CM | POA: Diagnosis not present

## 2024-04-06 DIAGNOSIS — N2581 Secondary hyperparathyroidism of renal origin: Secondary | ICD-10-CM | POA: Diagnosis not present

## 2024-04-06 DIAGNOSIS — D631 Anemia in chronic kidney disease: Secondary | ICD-10-CM | POA: Diagnosis not present

## 2024-04-06 DIAGNOSIS — Z992 Dependence on renal dialysis: Secondary | ICD-10-CM | POA: Diagnosis not present

## 2024-04-08 DIAGNOSIS — D631 Anemia in chronic kidney disease: Secondary | ICD-10-CM | POA: Diagnosis not present

## 2024-04-08 DIAGNOSIS — N186 End stage renal disease: Secondary | ICD-10-CM | POA: Diagnosis not present

## 2024-04-08 DIAGNOSIS — Z992 Dependence on renal dialysis: Secondary | ICD-10-CM | POA: Diagnosis not present

## 2024-04-08 DIAGNOSIS — N2581 Secondary hyperparathyroidism of renal origin: Secondary | ICD-10-CM | POA: Diagnosis not present

## 2024-04-10 DIAGNOSIS — D631 Anemia in chronic kidney disease: Secondary | ICD-10-CM | POA: Diagnosis not present

## 2024-04-10 DIAGNOSIS — N186 End stage renal disease: Secondary | ICD-10-CM | POA: Diagnosis not present

## 2024-04-10 DIAGNOSIS — N2581 Secondary hyperparathyroidism of renal origin: Secondary | ICD-10-CM | POA: Diagnosis not present

## 2024-04-10 DIAGNOSIS — Z992 Dependence on renal dialysis: Secondary | ICD-10-CM | POA: Diagnosis not present

## 2024-04-10 NOTE — Progress Notes (Signed)
 The ASCVD Risk score (Arnett DK, et al., 2019) failed to calculate for the following reasons:   Risk score cannot be calculated because patient has a medical history suggesting prior/existing ASCVD  Arlon Bergamo, BSN, RN

## 2024-04-12 DIAGNOSIS — N186 End stage renal disease: Secondary | ICD-10-CM | POA: Diagnosis not present

## 2024-04-12 DIAGNOSIS — I120 Hypertensive chronic kidney disease with stage 5 chronic kidney disease or end stage renal disease: Secondary | ICD-10-CM | POA: Diagnosis not present

## 2024-04-12 DIAGNOSIS — Z992 Dependence on renal dialysis: Secondary | ICD-10-CM | POA: Diagnosis not present

## 2024-04-13 DIAGNOSIS — Z992 Dependence on renal dialysis: Secondary | ICD-10-CM | POA: Diagnosis not present

## 2024-04-13 DIAGNOSIS — D631 Anemia in chronic kidney disease: Secondary | ICD-10-CM | POA: Diagnosis not present

## 2024-04-13 DIAGNOSIS — N2581 Secondary hyperparathyroidism of renal origin: Secondary | ICD-10-CM | POA: Diagnosis not present

## 2024-04-13 DIAGNOSIS — N186 End stage renal disease: Secondary | ICD-10-CM | POA: Diagnosis not present

## 2024-04-15 DIAGNOSIS — D631 Anemia in chronic kidney disease: Secondary | ICD-10-CM | POA: Diagnosis not present

## 2024-04-15 DIAGNOSIS — N186 End stage renal disease: Secondary | ICD-10-CM | POA: Diagnosis not present

## 2024-04-15 DIAGNOSIS — N2581 Secondary hyperparathyroidism of renal origin: Secondary | ICD-10-CM | POA: Diagnosis not present

## 2024-04-15 DIAGNOSIS — Z992 Dependence on renal dialysis: Secondary | ICD-10-CM | POA: Diagnosis not present

## 2024-04-17 DIAGNOSIS — D631 Anemia in chronic kidney disease: Secondary | ICD-10-CM | POA: Diagnosis not present

## 2024-04-17 DIAGNOSIS — N186 End stage renal disease: Secondary | ICD-10-CM | POA: Diagnosis not present

## 2024-04-17 DIAGNOSIS — N2581 Secondary hyperparathyroidism of renal origin: Secondary | ICD-10-CM | POA: Diagnosis not present

## 2024-04-17 DIAGNOSIS — Z992 Dependence on renal dialysis: Secondary | ICD-10-CM | POA: Diagnosis not present

## 2024-04-20 DIAGNOSIS — N186 End stage renal disease: Secondary | ICD-10-CM | POA: Diagnosis not present

## 2024-04-20 DIAGNOSIS — Z992 Dependence on renal dialysis: Secondary | ICD-10-CM | POA: Diagnosis not present

## 2024-04-20 DIAGNOSIS — N2581 Secondary hyperparathyroidism of renal origin: Secondary | ICD-10-CM | POA: Diagnosis not present

## 2024-04-20 DIAGNOSIS — D631 Anemia in chronic kidney disease: Secondary | ICD-10-CM | POA: Diagnosis not present

## 2024-04-21 DIAGNOSIS — Z992 Dependence on renal dialysis: Secondary | ICD-10-CM | POA: Diagnosis not present

## 2024-04-21 DIAGNOSIS — N2581 Secondary hyperparathyroidism of renal origin: Secondary | ICD-10-CM | POA: Diagnosis not present

## 2024-04-21 DIAGNOSIS — N186 End stage renal disease: Secondary | ICD-10-CM | POA: Diagnosis not present

## 2024-04-21 DIAGNOSIS — D631 Anemia in chronic kidney disease: Secondary | ICD-10-CM | POA: Diagnosis not present

## 2024-04-24 DIAGNOSIS — N2581 Secondary hyperparathyroidism of renal origin: Secondary | ICD-10-CM | POA: Diagnosis not present

## 2024-04-24 DIAGNOSIS — Z992 Dependence on renal dialysis: Secondary | ICD-10-CM | POA: Diagnosis not present

## 2024-04-24 DIAGNOSIS — N186 End stage renal disease: Secondary | ICD-10-CM | POA: Diagnosis not present

## 2024-04-24 DIAGNOSIS — D631 Anemia in chronic kidney disease: Secondary | ICD-10-CM | POA: Diagnosis not present

## 2024-04-27 DIAGNOSIS — N186 End stage renal disease: Secondary | ICD-10-CM | POA: Diagnosis not present

## 2024-04-27 DIAGNOSIS — Z992 Dependence on renal dialysis: Secondary | ICD-10-CM | POA: Diagnosis not present

## 2024-04-27 DIAGNOSIS — D631 Anemia in chronic kidney disease: Secondary | ICD-10-CM | POA: Diagnosis not present

## 2024-04-27 DIAGNOSIS — N2581 Secondary hyperparathyroidism of renal origin: Secondary | ICD-10-CM | POA: Diagnosis not present

## 2024-04-29 DIAGNOSIS — N2581 Secondary hyperparathyroidism of renal origin: Secondary | ICD-10-CM | POA: Diagnosis not present

## 2024-04-29 DIAGNOSIS — Z992 Dependence on renal dialysis: Secondary | ICD-10-CM | POA: Diagnosis not present

## 2024-04-29 DIAGNOSIS — N186 End stage renal disease: Secondary | ICD-10-CM | POA: Diagnosis not present

## 2024-04-29 DIAGNOSIS — D631 Anemia in chronic kidney disease: Secondary | ICD-10-CM | POA: Diagnosis not present

## 2024-05-01 ENCOUNTER — Other Ambulatory Visit: Payer: Self-pay

## 2024-05-01 DIAGNOSIS — N186 End stage renal disease: Secondary | ICD-10-CM | POA: Diagnosis not present

## 2024-05-01 DIAGNOSIS — D631 Anemia in chronic kidney disease: Secondary | ICD-10-CM | POA: Diagnosis not present

## 2024-05-01 DIAGNOSIS — N2581 Secondary hyperparathyroidism of renal origin: Secondary | ICD-10-CM | POA: Diagnosis not present

## 2024-05-01 DIAGNOSIS — Z992 Dependence on renal dialysis: Secondary | ICD-10-CM | POA: Diagnosis not present

## 2024-05-04 ENCOUNTER — Encounter (INDEPENDENT_AMBULATORY_CARE_PROVIDER_SITE_OTHER): Payer: Self-pay

## 2024-05-04 ENCOUNTER — Other Ambulatory Visit: Payer: Self-pay

## 2024-05-04 DIAGNOSIS — N186 End stage renal disease: Secondary | ICD-10-CM | POA: Diagnosis not present

## 2024-05-04 DIAGNOSIS — N2581 Secondary hyperparathyroidism of renal origin: Secondary | ICD-10-CM | POA: Diagnosis not present

## 2024-05-04 DIAGNOSIS — D631 Anemia in chronic kidney disease: Secondary | ICD-10-CM | POA: Diagnosis not present

## 2024-05-04 DIAGNOSIS — Z992 Dependence on renal dialysis: Secondary | ICD-10-CM | POA: Diagnosis not present

## 2024-05-04 NOTE — Progress Notes (Signed)
 Specialty Pharmacy Refill Coordination Note  Tiffany Golden is a 48 y.o. female contacted today regarding refills of specialty medication(s) Bictegravir-Emtricitab-Tenofov (Biktarvy )   Patient requested (Patient-Rptd) Delivery   Delivery date: 05/05/24   Verified address: (Patient-Rptd) [address], [place], [place] [address]   Medication will be filled on 06.23.25.

## 2024-05-06 DIAGNOSIS — N2581 Secondary hyperparathyroidism of renal origin: Secondary | ICD-10-CM | POA: Diagnosis not present

## 2024-05-06 DIAGNOSIS — Z992 Dependence on renal dialysis: Secondary | ICD-10-CM | POA: Diagnosis not present

## 2024-05-06 DIAGNOSIS — D631 Anemia in chronic kidney disease: Secondary | ICD-10-CM | POA: Diagnosis not present

## 2024-05-06 DIAGNOSIS — N186 End stage renal disease: Secondary | ICD-10-CM | POA: Diagnosis not present

## 2024-05-08 DIAGNOSIS — D631 Anemia in chronic kidney disease: Secondary | ICD-10-CM | POA: Diagnosis not present

## 2024-05-08 DIAGNOSIS — N186 End stage renal disease: Secondary | ICD-10-CM | POA: Diagnosis not present

## 2024-05-08 DIAGNOSIS — N2581 Secondary hyperparathyroidism of renal origin: Secondary | ICD-10-CM | POA: Diagnosis not present

## 2024-05-08 DIAGNOSIS — Z992 Dependence on renal dialysis: Secondary | ICD-10-CM | POA: Diagnosis not present

## 2024-05-11 DIAGNOSIS — Z992 Dependence on renal dialysis: Secondary | ICD-10-CM | POA: Diagnosis not present

## 2024-05-11 DIAGNOSIS — N2581 Secondary hyperparathyroidism of renal origin: Secondary | ICD-10-CM | POA: Diagnosis not present

## 2024-05-11 DIAGNOSIS — D631 Anemia in chronic kidney disease: Secondary | ICD-10-CM | POA: Diagnosis not present

## 2024-05-11 DIAGNOSIS — N186 End stage renal disease: Secondary | ICD-10-CM | POA: Diagnosis not present

## 2024-05-12 DIAGNOSIS — N186 End stage renal disease: Secondary | ICD-10-CM | POA: Diagnosis not present

## 2024-05-12 DIAGNOSIS — Z992 Dependence on renal dialysis: Secondary | ICD-10-CM | POA: Diagnosis not present

## 2024-05-12 DIAGNOSIS — I120 Hypertensive chronic kidney disease with stage 5 chronic kidney disease or end stage renal disease: Secondary | ICD-10-CM | POA: Diagnosis not present

## 2024-05-13 DIAGNOSIS — N2581 Secondary hyperparathyroidism of renal origin: Secondary | ICD-10-CM | POA: Diagnosis not present

## 2024-05-13 DIAGNOSIS — D631 Anemia in chronic kidney disease: Secondary | ICD-10-CM | POA: Diagnosis not present

## 2024-05-13 DIAGNOSIS — N186 End stage renal disease: Secondary | ICD-10-CM | POA: Diagnosis not present

## 2024-05-13 DIAGNOSIS — Z992 Dependence on renal dialysis: Secondary | ICD-10-CM | POA: Diagnosis not present

## 2024-05-15 DIAGNOSIS — N186 End stage renal disease: Secondary | ICD-10-CM | POA: Diagnosis not present

## 2024-05-15 DIAGNOSIS — Z992 Dependence on renal dialysis: Secondary | ICD-10-CM | POA: Diagnosis not present

## 2024-05-15 DIAGNOSIS — N2581 Secondary hyperparathyroidism of renal origin: Secondary | ICD-10-CM | POA: Diagnosis not present

## 2024-05-15 DIAGNOSIS — D631 Anemia in chronic kidney disease: Secondary | ICD-10-CM | POA: Diagnosis not present

## 2024-05-18 DIAGNOSIS — D631 Anemia in chronic kidney disease: Secondary | ICD-10-CM | POA: Diagnosis not present

## 2024-05-18 DIAGNOSIS — N186 End stage renal disease: Secondary | ICD-10-CM | POA: Diagnosis not present

## 2024-05-18 DIAGNOSIS — Z992 Dependence on renal dialysis: Secondary | ICD-10-CM | POA: Diagnosis not present

## 2024-05-18 DIAGNOSIS — N2581 Secondary hyperparathyroidism of renal origin: Secondary | ICD-10-CM | POA: Diagnosis not present

## 2024-05-20 DIAGNOSIS — N186 End stage renal disease: Secondary | ICD-10-CM | POA: Diagnosis not present

## 2024-05-20 DIAGNOSIS — D631 Anemia in chronic kidney disease: Secondary | ICD-10-CM | POA: Diagnosis not present

## 2024-05-20 DIAGNOSIS — N2581 Secondary hyperparathyroidism of renal origin: Secondary | ICD-10-CM | POA: Diagnosis not present

## 2024-05-20 DIAGNOSIS — Z992 Dependence on renal dialysis: Secondary | ICD-10-CM | POA: Diagnosis not present

## 2024-05-22 DIAGNOSIS — N186 End stage renal disease: Secondary | ICD-10-CM | POA: Diagnosis not present

## 2024-05-22 DIAGNOSIS — N2581 Secondary hyperparathyroidism of renal origin: Secondary | ICD-10-CM | POA: Diagnosis not present

## 2024-05-22 DIAGNOSIS — D631 Anemia in chronic kidney disease: Secondary | ICD-10-CM | POA: Diagnosis not present

## 2024-05-22 DIAGNOSIS — Z992 Dependence on renal dialysis: Secondary | ICD-10-CM | POA: Diagnosis not present

## 2024-05-25 DIAGNOSIS — Z992 Dependence on renal dialysis: Secondary | ICD-10-CM | POA: Diagnosis not present

## 2024-05-25 DIAGNOSIS — N186 End stage renal disease: Secondary | ICD-10-CM | POA: Diagnosis not present

## 2024-05-25 DIAGNOSIS — N2581 Secondary hyperparathyroidism of renal origin: Secondary | ICD-10-CM | POA: Diagnosis not present

## 2024-05-25 DIAGNOSIS — D631 Anemia in chronic kidney disease: Secondary | ICD-10-CM | POA: Diagnosis not present

## 2024-05-27 DIAGNOSIS — Z992 Dependence on renal dialysis: Secondary | ICD-10-CM | POA: Diagnosis not present

## 2024-05-27 DIAGNOSIS — D631 Anemia in chronic kidney disease: Secondary | ICD-10-CM | POA: Diagnosis not present

## 2024-05-27 DIAGNOSIS — N2581 Secondary hyperparathyroidism of renal origin: Secondary | ICD-10-CM | POA: Diagnosis not present

## 2024-05-27 DIAGNOSIS — N186 End stage renal disease: Secondary | ICD-10-CM | POA: Diagnosis not present

## 2024-06-01 ENCOUNTER — Other Ambulatory Visit (HOSPITAL_COMMUNITY): Payer: Self-pay

## 2024-06-01 ENCOUNTER — Encounter (INDEPENDENT_AMBULATORY_CARE_PROVIDER_SITE_OTHER): Payer: Self-pay

## 2024-06-01 DIAGNOSIS — N2581 Secondary hyperparathyroidism of renal origin: Secondary | ICD-10-CM | POA: Diagnosis not present

## 2024-06-01 DIAGNOSIS — N186 End stage renal disease: Secondary | ICD-10-CM | POA: Diagnosis not present

## 2024-06-01 DIAGNOSIS — D631 Anemia in chronic kidney disease: Secondary | ICD-10-CM | POA: Diagnosis not present

## 2024-06-01 DIAGNOSIS — Z992 Dependence on renal dialysis: Secondary | ICD-10-CM | POA: Diagnosis not present

## 2024-06-02 ENCOUNTER — Other Ambulatory Visit: Payer: Self-pay | Admitting: Pharmacy Technician

## 2024-06-02 ENCOUNTER — Other Ambulatory Visit: Payer: Self-pay

## 2024-06-02 NOTE — Progress Notes (Signed)
 Specialty Pharmacy Refill Coordination Note  Tiffany Golden is a 48 y.o. female contacted today regarding refills of specialty medication(s) Bictegravir-Emtricitab-Tenofov (Biktarvy )   Patient requested (Patient-Rptd) Delivery   Delivery date: 06/08/24   Verified address: [address], [place], [place] [address]   Medication will be filled on 06/05/24.

## 2024-06-03 DIAGNOSIS — D631 Anemia in chronic kidney disease: Secondary | ICD-10-CM | POA: Diagnosis not present

## 2024-06-03 DIAGNOSIS — Z992 Dependence on renal dialysis: Secondary | ICD-10-CM | POA: Diagnosis not present

## 2024-06-03 DIAGNOSIS — N186 End stage renal disease: Secondary | ICD-10-CM | POA: Diagnosis not present

## 2024-06-03 DIAGNOSIS — N2581 Secondary hyperparathyroidism of renal origin: Secondary | ICD-10-CM | POA: Diagnosis not present

## 2024-06-05 ENCOUNTER — Other Ambulatory Visit: Payer: Self-pay

## 2024-06-05 DIAGNOSIS — N2581 Secondary hyperparathyroidism of renal origin: Secondary | ICD-10-CM | POA: Diagnosis not present

## 2024-06-05 DIAGNOSIS — D631 Anemia in chronic kidney disease: Secondary | ICD-10-CM | POA: Diagnosis not present

## 2024-06-05 DIAGNOSIS — N186 End stage renal disease: Secondary | ICD-10-CM | POA: Diagnosis not present

## 2024-06-05 DIAGNOSIS — Z992 Dependence on renal dialysis: Secondary | ICD-10-CM | POA: Diagnosis not present

## 2024-06-08 DIAGNOSIS — N186 End stage renal disease: Secondary | ICD-10-CM | POA: Diagnosis not present

## 2024-06-08 DIAGNOSIS — N2581 Secondary hyperparathyroidism of renal origin: Secondary | ICD-10-CM | POA: Diagnosis not present

## 2024-06-08 DIAGNOSIS — D631 Anemia in chronic kidney disease: Secondary | ICD-10-CM | POA: Diagnosis not present

## 2024-06-08 DIAGNOSIS — Z992 Dependence on renal dialysis: Secondary | ICD-10-CM | POA: Diagnosis not present

## 2024-06-10 DIAGNOSIS — D631 Anemia in chronic kidney disease: Secondary | ICD-10-CM | POA: Diagnosis not present

## 2024-06-10 DIAGNOSIS — Z992 Dependence on renal dialysis: Secondary | ICD-10-CM | POA: Diagnosis not present

## 2024-06-10 DIAGNOSIS — N186 End stage renal disease: Secondary | ICD-10-CM | POA: Diagnosis not present

## 2024-06-10 DIAGNOSIS — N2581 Secondary hyperparathyroidism of renal origin: Secondary | ICD-10-CM | POA: Diagnosis not present

## 2024-06-12 DIAGNOSIS — Z992 Dependence on renal dialysis: Secondary | ICD-10-CM | POA: Diagnosis not present

## 2024-06-12 DIAGNOSIS — I120 Hypertensive chronic kidney disease with stage 5 chronic kidney disease or end stage renal disease: Secondary | ICD-10-CM | POA: Diagnosis not present

## 2024-06-12 DIAGNOSIS — N2581 Secondary hyperparathyroidism of renal origin: Secondary | ICD-10-CM | POA: Diagnosis not present

## 2024-06-12 DIAGNOSIS — N186 End stage renal disease: Secondary | ICD-10-CM | POA: Diagnosis not present

## 2024-06-12 DIAGNOSIS — D631 Anemia in chronic kidney disease: Secondary | ICD-10-CM | POA: Diagnosis not present

## 2024-06-15 DIAGNOSIS — Z992 Dependence on renal dialysis: Secondary | ICD-10-CM | POA: Diagnosis not present

## 2024-06-15 DIAGNOSIS — D631 Anemia in chronic kidney disease: Secondary | ICD-10-CM | POA: Diagnosis not present

## 2024-06-15 DIAGNOSIS — N186 End stage renal disease: Secondary | ICD-10-CM | POA: Diagnosis not present

## 2024-06-15 DIAGNOSIS — N2581 Secondary hyperparathyroidism of renal origin: Secondary | ICD-10-CM | POA: Diagnosis not present

## 2024-06-17 DIAGNOSIS — Z992 Dependence on renal dialysis: Secondary | ICD-10-CM | POA: Diagnosis not present

## 2024-06-17 DIAGNOSIS — D631 Anemia in chronic kidney disease: Secondary | ICD-10-CM | POA: Diagnosis not present

## 2024-06-17 DIAGNOSIS — N2581 Secondary hyperparathyroidism of renal origin: Secondary | ICD-10-CM | POA: Diagnosis not present

## 2024-06-17 DIAGNOSIS — N186 End stage renal disease: Secondary | ICD-10-CM | POA: Diagnosis not present

## 2024-06-19 DIAGNOSIS — D631 Anemia in chronic kidney disease: Secondary | ICD-10-CM | POA: Diagnosis not present

## 2024-06-19 DIAGNOSIS — N2581 Secondary hyperparathyroidism of renal origin: Secondary | ICD-10-CM | POA: Diagnosis not present

## 2024-06-19 DIAGNOSIS — Z992 Dependence on renal dialysis: Secondary | ICD-10-CM | POA: Diagnosis not present

## 2024-06-19 DIAGNOSIS — N186 End stage renal disease: Secondary | ICD-10-CM | POA: Diagnosis not present

## 2024-06-22 DIAGNOSIS — N2581 Secondary hyperparathyroidism of renal origin: Secondary | ICD-10-CM | POA: Diagnosis not present

## 2024-06-22 DIAGNOSIS — N186 End stage renal disease: Secondary | ICD-10-CM | POA: Diagnosis not present

## 2024-06-22 DIAGNOSIS — D631 Anemia in chronic kidney disease: Secondary | ICD-10-CM | POA: Diagnosis not present

## 2024-06-22 DIAGNOSIS — Z992 Dependence on renal dialysis: Secondary | ICD-10-CM | POA: Diagnosis not present

## 2024-06-23 ENCOUNTER — Other Ambulatory Visit: Payer: Self-pay

## 2024-06-23 ENCOUNTER — Encounter (INDEPENDENT_AMBULATORY_CARE_PROVIDER_SITE_OTHER): Payer: Self-pay

## 2024-06-24 ENCOUNTER — Other Ambulatory Visit (HOSPITAL_COMMUNITY): Payer: Self-pay

## 2024-06-24 ENCOUNTER — Other Ambulatory Visit: Payer: Self-pay

## 2024-06-24 DIAGNOSIS — D631 Anemia in chronic kidney disease: Secondary | ICD-10-CM | POA: Diagnosis not present

## 2024-06-24 DIAGNOSIS — N186 End stage renal disease: Secondary | ICD-10-CM | POA: Diagnosis not present

## 2024-06-24 DIAGNOSIS — N2581 Secondary hyperparathyroidism of renal origin: Secondary | ICD-10-CM | POA: Diagnosis not present

## 2024-06-24 DIAGNOSIS — Z992 Dependence on renal dialysis: Secondary | ICD-10-CM | POA: Diagnosis not present

## 2024-06-24 NOTE — Progress Notes (Signed)
 Specialty Pharmacy Refill Coordination Note  MyChart Questionnaire Submission  Tiffany Golden is a 48 y.o. female contacted today regarding refills of specialty medication(s) Biktarvy .  Doses on hand: (Patient-Rptd) 12   Patient requested: (Patient-Rptd) Delivery   Delivery date: 06/30/24  Verified address: [address] [name] [address]  Medication will be filled on 06/29/24.

## 2024-06-26 DIAGNOSIS — N2581 Secondary hyperparathyroidism of renal origin: Secondary | ICD-10-CM | POA: Diagnosis not present

## 2024-06-26 DIAGNOSIS — Z992 Dependence on renal dialysis: Secondary | ICD-10-CM | POA: Diagnosis not present

## 2024-06-26 DIAGNOSIS — N186 End stage renal disease: Secondary | ICD-10-CM | POA: Diagnosis not present

## 2024-06-26 DIAGNOSIS — D631 Anemia in chronic kidney disease: Secondary | ICD-10-CM | POA: Diagnosis not present

## 2024-06-29 ENCOUNTER — Other Ambulatory Visit: Payer: Self-pay

## 2024-06-29 DIAGNOSIS — N2581 Secondary hyperparathyroidism of renal origin: Secondary | ICD-10-CM | POA: Diagnosis not present

## 2024-06-29 DIAGNOSIS — N186 End stage renal disease: Secondary | ICD-10-CM | POA: Diagnosis not present

## 2024-06-29 DIAGNOSIS — D631 Anemia in chronic kidney disease: Secondary | ICD-10-CM | POA: Diagnosis not present

## 2024-06-29 DIAGNOSIS — Z992 Dependence on renal dialysis: Secondary | ICD-10-CM | POA: Diagnosis not present

## 2024-07-01 DIAGNOSIS — N186 End stage renal disease: Secondary | ICD-10-CM | POA: Diagnosis not present

## 2024-07-01 DIAGNOSIS — D631 Anemia in chronic kidney disease: Secondary | ICD-10-CM | POA: Diagnosis not present

## 2024-07-01 DIAGNOSIS — Z992 Dependence on renal dialysis: Secondary | ICD-10-CM | POA: Diagnosis not present

## 2024-07-01 DIAGNOSIS — N2581 Secondary hyperparathyroidism of renal origin: Secondary | ICD-10-CM | POA: Diagnosis not present

## 2024-07-03 DIAGNOSIS — N2581 Secondary hyperparathyroidism of renal origin: Secondary | ICD-10-CM | POA: Diagnosis not present

## 2024-07-03 DIAGNOSIS — N186 End stage renal disease: Secondary | ICD-10-CM | POA: Diagnosis not present

## 2024-07-03 DIAGNOSIS — Z992 Dependence on renal dialysis: Secondary | ICD-10-CM | POA: Diagnosis not present

## 2024-07-03 DIAGNOSIS — D631 Anemia in chronic kidney disease: Secondary | ICD-10-CM | POA: Diagnosis not present

## 2024-07-06 DIAGNOSIS — D631 Anemia in chronic kidney disease: Secondary | ICD-10-CM | POA: Diagnosis not present

## 2024-07-06 DIAGNOSIS — N2581 Secondary hyperparathyroidism of renal origin: Secondary | ICD-10-CM | POA: Diagnosis not present

## 2024-07-06 DIAGNOSIS — Z992 Dependence on renal dialysis: Secondary | ICD-10-CM | POA: Diagnosis not present

## 2024-07-06 DIAGNOSIS — N186 End stage renal disease: Secondary | ICD-10-CM | POA: Diagnosis not present

## 2024-07-08 DIAGNOSIS — N186 End stage renal disease: Secondary | ICD-10-CM | POA: Diagnosis not present

## 2024-07-08 DIAGNOSIS — Z992 Dependence on renal dialysis: Secondary | ICD-10-CM | POA: Diagnosis not present

## 2024-07-08 DIAGNOSIS — D631 Anemia in chronic kidney disease: Secondary | ICD-10-CM | POA: Diagnosis not present

## 2024-07-08 DIAGNOSIS — N2581 Secondary hyperparathyroidism of renal origin: Secondary | ICD-10-CM | POA: Diagnosis not present

## 2024-07-10 DIAGNOSIS — N2581 Secondary hyperparathyroidism of renal origin: Secondary | ICD-10-CM | POA: Diagnosis not present

## 2024-07-10 DIAGNOSIS — Z992 Dependence on renal dialysis: Secondary | ICD-10-CM | POA: Diagnosis not present

## 2024-07-10 DIAGNOSIS — N186 End stage renal disease: Secondary | ICD-10-CM | POA: Diagnosis not present

## 2024-07-10 DIAGNOSIS — D631 Anemia in chronic kidney disease: Secondary | ICD-10-CM | POA: Diagnosis not present

## 2024-07-13 DIAGNOSIS — N2581 Secondary hyperparathyroidism of renal origin: Secondary | ICD-10-CM | POA: Diagnosis not present

## 2024-07-13 DIAGNOSIS — R52 Pain, unspecified: Secondary | ICD-10-CM | POA: Diagnosis not present

## 2024-07-13 DIAGNOSIS — D631 Anemia in chronic kidney disease: Secondary | ICD-10-CM | POA: Diagnosis not present

## 2024-07-13 DIAGNOSIS — N186 End stage renal disease: Secondary | ICD-10-CM | POA: Diagnosis not present

## 2024-07-13 DIAGNOSIS — Z992 Dependence on renal dialysis: Secondary | ICD-10-CM | POA: Diagnosis not present

## 2024-07-13 DIAGNOSIS — I120 Hypertensive chronic kidney disease with stage 5 chronic kidney disease or end stage renal disease: Secondary | ICD-10-CM | POA: Diagnosis not present

## 2024-07-15 DIAGNOSIS — N2581 Secondary hyperparathyroidism of renal origin: Secondary | ICD-10-CM | POA: Diagnosis not present

## 2024-07-15 DIAGNOSIS — D631 Anemia in chronic kidney disease: Secondary | ICD-10-CM | POA: Diagnosis not present

## 2024-07-15 DIAGNOSIS — Z992 Dependence on renal dialysis: Secondary | ICD-10-CM | POA: Diagnosis not present

## 2024-07-15 DIAGNOSIS — N186 End stage renal disease: Secondary | ICD-10-CM | POA: Diagnosis not present

## 2024-07-15 DIAGNOSIS — R52 Pain, unspecified: Secondary | ICD-10-CM | POA: Diagnosis not present

## 2024-07-17 DIAGNOSIS — R52 Pain, unspecified: Secondary | ICD-10-CM | POA: Diagnosis not present

## 2024-07-17 DIAGNOSIS — D631 Anemia in chronic kidney disease: Secondary | ICD-10-CM | POA: Diagnosis not present

## 2024-07-17 DIAGNOSIS — N186 End stage renal disease: Secondary | ICD-10-CM | POA: Diagnosis not present

## 2024-07-17 DIAGNOSIS — N2581 Secondary hyperparathyroidism of renal origin: Secondary | ICD-10-CM | POA: Diagnosis not present

## 2024-07-17 DIAGNOSIS — Z992 Dependence on renal dialysis: Secondary | ICD-10-CM | POA: Diagnosis not present

## 2024-07-20 DIAGNOSIS — R52 Pain, unspecified: Secondary | ICD-10-CM | POA: Diagnosis not present

## 2024-07-20 DIAGNOSIS — N2581 Secondary hyperparathyroidism of renal origin: Secondary | ICD-10-CM | POA: Diagnosis not present

## 2024-07-20 DIAGNOSIS — Z992 Dependence on renal dialysis: Secondary | ICD-10-CM | POA: Diagnosis not present

## 2024-07-20 DIAGNOSIS — N186 End stage renal disease: Secondary | ICD-10-CM | POA: Diagnosis not present

## 2024-07-20 DIAGNOSIS — D631 Anemia in chronic kidney disease: Secondary | ICD-10-CM | POA: Diagnosis not present

## 2024-07-22 DIAGNOSIS — Z992 Dependence on renal dialysis: Secondary | ICD-10-CM | POA: Diagnosis not present

## 2024-07-22 DIAGNOSIS — N186 End stage renal disease: Secondary | ICD-10-CM | POA: Diagnosis not present

## 2024-07-22 DIAGNOSIS — N2581 Secondary hyperparathyroidism of renal origin: Secondary | ICD-10-CM | POA: Diagnosis not present

## 2024-07-22 DIAGNOSIS — D631 Anemia in chronic kidney disease: Secondary | ICD-10-CM | POA: Diagnosis not present

## 2024-07-22 DIAGNOSIS — R52 Pain, unspecified: Secondary | ICD-10-CM | POA: Diagnosis not present

## 2024-07-23 ENCOUNTER — Encounter (INDEPENDENT_AMBULATORY_CARE_PROVIDER_SITE_OTHER): Payer: Self-pay

## 2024-07-24 ENCOUNTER — Other Ambulatory Visit: Payer: Self-pay

## 2024-07-24 ENCOUNTER — Other Ambulatory Visit (HOSPITAL_COMMUNITY): Payer: Self-pay

## 2024-07-24 DIAGNOSIS — R52 Pain, unspecified: Secondary | ICD-10-CM | POA: Diagnosis not present

## 2024-07-24 DIAGNOSIS — D631 Anemia in chronic kidney disease: Secondary | ICD-10-CM | POA: Diagnosis not present

## 2024-07-24 DIAGNOSIS — Z992 Dependence on renal dialysis: Secondary | ICD-10-CM | POA: Diagnosis not present

## 2024-07-24 DIAGNOSIS — N186 End stage renal disease: Secondary | ICD-10-CM | POA: Diagnosis not present

## 2024-07-24 DIAGNOSIS — N2581 Secondary hyperparathyroidism of renal origin: Secondary | ICD-10-CM | POA: Diagnosis not present

## 2024-07-24 NOTE — Progress Notes (Signed)
 Specialty Pharmacy Refill Coordination Note  MyChart Questionnaire Submission  Tiffany Golden is a 48 y.o. female contacted today regarding refills of specialty medication(s) Biktarvy .  Doses on hand: (Patient-Rptd) 12   Patient requested: (Patient-Rptd) Delivery   Delivery date: 07/28/24  Verified address: [address] [name] [address]  Medication will be filled on 07/27/24.

## 2024-07-27 ENCOUNTER — Other Ambulatory Visit: Payer: Self-pay

## 2024-07-27 DIAGNOSIS — R52 Pain, unspecified: Secondary | ICD-10-CM | POA: Diagnosis not present

## 2024-07-27 DIAGNOSIS — N186 End stage renal disease: Secondary | ICD-10-CM | POA: Diagnosis not present

## 2024-07-27 DIAGNOSIS — D631 Anemia in chronic kidney disease: Secondary | ICD-10-CM | POA: Diagnosis not present

## 2024-07-27 DIAGNOSIS — N2581 Secondary hyperparathyroidism of renal origin: Secondary | ICD-10-CM | POA: Diagnosis not present

## 2024-07-27 DIAGNOSIS — Z992 Dependence on renal dialysis: Secondary | ICD-10-CM | POA: Diagnosis not present

## 2024-07-29 DIAGNOSIS — Z992 Dependence on renal dialysis: Secondary | ICD-10-CM | POA: Diagnosis not present

## 2024-07-29 DIAGNOSIS — N2581 Secondary hyperparathyroidism of renal origin: Secondary | ICD-10-CM | POA: Diagnosis not present

## 2024-07-29 DIAGNOSIS — N186 End stage renal disease: Secondary | ICD-10-CM | POA: Diagnosis not present

## 2024-07-29 DIAGNOSIS — R52 Pain, unspecified: Secondary | ICD-10-CM | POA: Diagnosis not present

## 2024-07-29 DIAGNOSIS — D631 Anemia in chronic kidney disease: Secondary | ICD-10-CM | POA: Diagnosis not present

## 2024-07-31 DIAGNOSIS — Z992 Dependence on renal dialysis: Secondary | ICD-10-CM | POA: Diagnosis not present

## 2024-07-31 DIAGNOSIS — N2581 Secondary hyperparathyroidism of renal origin: Secondary | ICD-10-CM | POA: Diagnosis not present

## 2024-07-31 DIAGNOSIS — D631 Anemia in chronic kidney disease: Secondary | ICD-10-CM | POA: Diagnosis not present

## 2024-07-31 DIAGNOSIS — N186 End stage renal disease: Secondary | ICD-10-CM | POA: Diagnosis not present

## 2024-07-31 DIAGNOSIS — R52 Pain, unspecified: Secondary | ICD-10-CM | POA: Diagnosis not present

## 2024-08-03 DIAGNOSIS — Z992 Dependence on renal dialysis: Secondary | ICD-10-CM | POA: Diagnosis not present

## 2024-08-03 DIAGNOSIS — D631 Anemia in chronic kidney disease: Secondary | ICD-10-CM | POA: Diagnosis not present

## 2024-08-03 DIAGNOSIS — R52 Pain, unspecified: Secondary | ICD-10-CM | POA: Diagnosis not present

## 2024-08-03 DIAGNOSIS — N2581 Secondary hyperparathyroidism of renal origin: Secondary | ICD-10-CM | POA: Diagnosis not present

## 2024-08-03 DIAGNOSIS — N186 End stage renal disease: Secondary | ICD-10-CM | POA: Diagnosis not present

## 2024-08-05 DIAGNOSIS — N2581 Secondary hyperparathyroidism of renal origin: Secondary | ICD-10-CM | POA: Diagnosis not present

## 2024-08-05 DIAGNOSIS — D631 Anemia in chronic kidney disease: Secondary | ICD-10-CM | POA: Diagnosis not present

## 2024-08-05 DIAGNOSIS — N186 End stage renal disease: Secondary | ICD-10-CM | POA: Diagnosis not present

## 2024-08-05 DIAGNOSIS — R52 Pain, unspecified: Secondary | ICD-10-CM | POA: Diagnosis not present

## 2024-08-05 DIAGNOSIS — Z992 Dependence on renal dialysis: Secondary | ICD-10-CM | POA: Diagnosis not present

## 2024-08-07 DIAGNOSIS — R52 Pain, unspecified: Secondary | ICD-10-CM | POA: Diagnosis not present

## 2024-08-07 DIAGNOSIS — Z992 Dependence on renal dialysis: Secondary | ICD-10-CM | POA: Diagnosis not present

## 2024-08-07 DIAGNOSIS — N2581 Secondary hyperparathyroidism of renal origin: Secondary | ICD-10-CM | POA: Diagnosis not present

## 2024-08-07 DIAGNOSIS — D631 Anemia in chronic kidney disease: Secondary | ICD-10-CM | POA: Diagnosis not present

## 2024-08-07 DIAGNOSIS — N186 End stage renal disease: Secondary | ICD-10-CM | POA: Diagnosis not present

## 2024-08-10 DIAGNOSIS — N186 End stage renal disease: Secondary | ICD-10-CM | POA: Diagnosis not present

## 2024-08-10 DIAGNOSIS — Z992 Dependence on renal dialysis: Secondary | ICD-10-CM | POA: Diagnosis not present

## 2024-08-10 DIAGNOSIS — R52 Pain, unspecified: Secondary | ICD-10-CM | POA: Diagnosis not present

## 2024-08-10 DIAGNOSIS — D631 Anemia in chronic kidney disease: Secondary | ICD-10-CM | POA: Diagnosis not present

## 2024-08-10 DIAGNOSIS — N2581 Secondary hyperparathyroidism of renal origin: Secondary | ICD-10-CM | POA: Diagnosis not present

## 2024-08-12 DIAGNOSIS — D631 Anemia in chronic kidney disease: Secondary | ICD-10-CM | POA: Diagnosis not present

## 2024-08-12 DIAGNOSIS — N2581 Secondary hyperparathyroidism of renal origin: Secondary | ICD-10-CM | POA: Diagnosis not present

## 2024-08-12 DIAGNOSIS — Z23 Encounter for immunization: Secondary | ICD-10-CM | POA: Diagnosis not present

## 2024-08-12 DIAGNOSIS — N186 End stage renal disease: Secondary | ICD-10-CM | POA: Diagnosis not present

## 2024-08-12 DIAGNOSIS — I120 Hypertensive chronic kidney disease with stage 5 chronic kidney disease or end stage renal disease: Secondary | ICD-10-CM | POA: Diagnosis not present

## 2024-08-12 DIAGNOSIS — Z992 Dependence on renal dialysis: Secondary | ICD-10-CM | POA: Diagnosis not present

## 2024-08-14 DIAGNOSIS — N186 End stage renal disease: Secondary | ICD-10-CM | POA: Diagnosis not present

## 2024-08-14 DIAGNOSIS — Z23 Encounter for immunization: Secondary | ICD-10-CM | POA: Diagnosis not present

## 2024-08-14 DIAGNOSIS — N2581 Secondary hyperparathyroidism of renal origin: Secondary | ICD-10-CM | POA: Diagnosis not present

## 2024-08-14 DIAGNOSIS — Z992 Dependence on renal dialysis: Secondary | ICD-10-CM | POA: Diagnosis not present

## 2024-08-14 DIAGNOSIS — D631 Anemia in chronic kidney disease: Secondary | ICD-10-CM | POA: Diagnosis not present

## 2024-08-17 DIAGNOSIS — N186 End stage renal disease: Secondary | ICD-10-CM | POA: Diagnosis not present

## 2024-08-17 DIAGNOSIS — Z23 Encounter for immunization: Secondary | ICD-10-CM | POA: Diagnosis not present

## 2024-08-17 DIAGNOSIS — Z992 Dependence on renal dialysis: Secondary | ICD-10-CM | POA: Diagnosis not present

## 2024-08-17 DIAGNOSIS — D631 Anemia in chronic kidney disease: Secondary | ICD-10-CM | POA: Diagnosis not present

## 2024-08-17 DIAGNOSIS — N2581 Secondary hyperparathyroidism of renal origin: Secondary | ICD-10-CM | POA: Diagnosis not present

## 2024-08-19 DIAGNOSIS — D631 Anemia in chronic kidney disease: Secondary | ICD-10-CM | POA: Diagnosis not present

## 2024-08-19 DIAGNOSIS — Z23 Encounter for immunization: Secondary | ICD-10-CM | POA: Diagnosis not present

## 2024-08-19 DIAGNOSIS — Z992 Dependence on renal dialysis: Secondary | ICD-10-CM | POA: Diagnosis not present

## 2024-08-19 DIAGNOSIS — N2581 Secondary hyperparathyroidism of renal origin: Secondary | ICD-10-CM | POA: Diagnosis not present

## 2024-08-19 DIAGNOSIS — N186 End stage renal disease: Secondary | ICD-10-CM | POA: Diagnosis not present

## 2024-08-21 DIAGNOSIS — N2581 Secondary hyperparathyroidism of renal origin: Secondary | ICD-10-CM | POA: Diagnosis not present

## 2024-08-21 DIAGNOSIS — Z23 Encounter for immunization: Secondary | ICD-10-CM | POA: Diagnosis not present

## 2024-08-21 DIAGNOSIS — Z992 Dependence on renal dialysis: Secondary | ICD-10-CM | POA: Diagnosis not present

## 2024-08-21 DIAGNOSIS — D631 Anemia in chronic kidney disease: Secondary | ICD-10-CM | POA: Diagnosis not present

## 2024-08-21 DIAGNOSIS — N186 End stage renal disease: Secondary | ICD-10-CM | POA: Diagnosis not present

## 2024-08-24 DIAGNOSIS — Z992 Dependence on renal dialysis: Secondary | ICD-10-CM | POA: Diagnosis not present

## 2024-08-24 DIAGNOSIS — N2581 Secondary hyperparathyroidism of renal origin: Secondary | ICD-10-CM | POA: Diagnosis not present

## 2024-08-24 DIAGNOSIS — Z23 Encounter for immunization: Secondary | ICD-10-CM | POA: Diagnosis not present

## 2024-08-24 DIAGNOSIS — D631 Anemia in chronic kidney disease: Secondary | ICD-10-CM | POA: Diagnosis not present

## 2024-08-24 DIAGNOSIS — N186 End stage renal disease: Secondary | ICD-10-CM | POA: Diagnosis not present

## 2024-08-25 ENCOUNTER — Other Ambulatory Visit (HOSPITAL_COMMUNITY): Payer: Self-pay

## 2024-08-26 DIAGNOSIS — Z23 Encounter for immunization: Secondary | ICD-10-CM | POA: Diagnosis not present

## 2024-08-26 DIAGNOSIS — Z992 Dependence on renal dialysis: Secondary | ICD-10-CM | POA: Diagnosis not present

## 2024-08-26 DIAGNOSIS — D631 Anemia in chronic kidney disease: Secondary | ICD-10-CM | POA: Diagnosis not present

## 2024-08-26 DIAGNOSIS — N186 End stage renal disease: Secondary | ICD-10-CM | POA: Diagnosis not present

## 2024-08-26 DIAGNOSIS — N2581 Secondary hyperparathyroidism of renal origin: Secondary | ICD-10-CM | POA: Diagnosis not present

## 2024-08-27 ENCOUNTER — Other Ambulatory Visit: Payer: Self-pay

## 2024-08-28 DIAGNOSIS — Z992 Dependence on renal dialysis: Secondary | ICD-10-CM | POA: Diagnosis not present

## 2024-08-28 DIAGNOSIS — D631 Anemia in chronic kidney disease: Secondary | ICD-10-CM | POA: Diagnosis not present

## 2024-08-28 DIAGNOSIS — Z23 Encounter for immunization: Secondary | ICD-10-CM | POA: Diagnosis not present

## 2024-08-28 DIAGNOSIS — N2581 Secondary hyperparathyroidism of renal origin: Secondary | ICD-10-CM | POA: Diagnosis not present

## 2024-08-28 DIAGNOSIS — N186 End stage renal disease: Secondary | ICD-10-CM | POA: Diagnosis not present

## 2024-08-31 ENCOUNTER — Other Ambulatory Visit (HOSPITAL_COMMUNITY): Payer: Self-pay

## 2024-08-31 DIAGNOSIS — N186 End stage renal disease: Secondary | ICD-10-CM | POA: Diagnosis not present

## 2024-08-31 DIAGNOSIS — N2581 Secondary hyperparathyroidism of renal origin: Secondary | ICD-10-CM | POA: Diagnosis not present

## 2024-08-31 DIAGNOSIS — D631 Anemia in chronic kidney disease: Secondary | ICD-10-CM | POA: Diagnosis not present

## 2024-08-31 DIAGNOSIS — Z992 Dependence on renal dialysis: Secondary | ICD-10-CM | POA: Diagnosis not present

## 2024-08-31 DIAGNOSIS — Z23 Encounter for immunization: Secondary | ICD-10-CM | POA: Diagnosis not present

## 2024-09-02 ENCOUNTER — Other Ambulatory Visit: Payer: Self-pay

## 2024-09-02 DIAGNOSIS — Z23 Encounter for immunization: Secondary | ICD-10-CM | POA: Diagnosis not present

## 2024-09-02 DIAGNOSIS — N186 End stage renal disease: Secondary | ICD-10-CM | POA: Diagnosis not present

## 2024-09-02 DIAGNOSIS — D631 Anemia in chronic kidney disease: Secondary | ICD-10-CM | POA: Diagnosis not present

## 2024-09-02 DIAGNOSIS — Z992 Dependence on renal dialysis: Secondary | ICD-10-CM | POA: Diagnosis not present

## 2024-09-02 DIAGNOSIS — N2581 Secondary hyperparathyroidism of renal origin: Secondary | ICD-10-CM | POA: Diagnosis not present

## 2024-09-02 NOTE — Progress Notes (Signed)
 Specialty Pharmacy Refill Coordination Note  Tiffany Golden is a 48 y.o. female contacted today regarding refills of specialty medication(s) Bictegravir-Emtricitab-Tenofov (Biktarvy )   Patient requested Delivery   Delivery date: 09/07/24   Verified address: [address] [place] [address]   Medication will be filled on 09/04/24.

## 2024-09-03 ENCOUNTER — Other Ambulatory Visit: Payer: Self-pay

## 2024-09-04 DIAGNOSIS — N186 End stage renal disease: Secondary | ICD-10-CM | POA: Diagnosis not present

## 2024-09-04 DIAGNOSIS — Z23 Encounter for immunization: Secondary | ICD-10-CM | POA: Diagnosis not present

## 2024-09-04 DIAGNOSIS — N2581 Secondary hyperparathyroidism of renal origin: Secondary | ICD-10-CM | POA: Diagnosis not present

## 2024-09-04 DIAGNOSIS — Z992 Dependence on renal dialysis: Secondary | ICD-10-CM | POA: Diagnosis not present

## 2024-09-04 DIAGNOSIS — D631 Anemia in chronic kidney disease: Secondary | ICD-10-CM | POA: Diagnosis not present

## 2024-09-07 DIAGNOSIS — D631 Anemia in chronic kidney disease: Secondary | ICD-10-CM | POA: Diagnosis not present

## 2024-09-07 DIAGNOSIS — N186 End stage renal disease: Secondary | ICD-10-CM | POA: Diagnosis not present

## 2024-09-07 DIAGNOSIS — N2581 Secondary hyperparathyroidism of renal origin: Secondary | ICD-10-CM | POA: Diagnosis not present

## 2024-09-07 DIAGNOSIS — Z23 Encounter for immunization: Secondary | ICD-10-CM | POA: Diagnosis not present

## 2024-09-07 DIAGNOSIS — Z992 Dependence on renal dialysis: Secondary | ICD-10-CM | POA: Diagnosis not present

## 2024-09-09 DIAGNOSIS — N186 End stage renal disease: Secondary | ICD-10-CM | POA: Diagnosis not present

## 2024-09-09 DIAGNOSIS — D631 Anemia in chronic kidney disease: Secondary | ICD-10-CM | POA: Diagnosis not present

## 2024-09-09 DIAGNOSIS — Z23 Encounter for immunization: Secondary | ICD-10-CM | POA: Diagnosis not present

## 2024-09-09 DIAGNOSIS — N2581 Secondary hyperparathyroidism of renal origin: Secondary | ICD-10-CM | POA: Diagnosis not present

## 2024-09-09 DIAGNOSIS — Z992 Dependence on renal dialysis: Secondary | ICD-10-CM | POA: Diagnosis not present

## 2024-09-11 DIAGNOSIS — N2581 Secondary hyperparathyroidism of renal origin: Secondary | ICD-10-CM | POA: Diagnosis not present

## 2024-09-11 DIAGNOSIS — N186 End stage renal disease: Secondary | ICD-10-CM | POA: Diagnosis not present

## 2024-09-11 DIAGNOSIS — Z23 Encounter for immunization: Secondary | ICD-10-CM | POA: Diagnosis not present

## 2024-09-11 DIAGNOSIS — Z992 Dependence on renal dialysis: Secondary | ICD-10-CM | POA: Diagnosis not present

## 2024-09-11 DIAGNOSIS — D631 Anemia in chronic kidney disease: Secondary | ICD-10-CM | POA: Diagnosis not present

## 2024-09-12 DIAGNOSIS — Z992 Dependence on renal dialysis: Secondary | ICD-10-CM | POA: Diagnosis not present

## 2024-09-12 DIAGNOSIS — I120 Hypertensive chronic kidney disease with stage 5 chronic kidney disease or end stage renal disease: Secondary | ICD-10-CM | POA: Diagnosis not present

## 2024-09-12 DIAGNOSIS — N186 End stage renal disease: Secondary | ICD-10-CM | POA: Diagnosis not present

## 2024-09-14 DIAGNOSIS — N186 End stage renal disease: Secondary | ICD-10-CM | POA: Diagnosis not present

## 2024-09-14 DIAGNOSIS — Z992 Dependence on renal dialysis: Secondary | ICD-10-CM | POA: Diagnosis not present

## 2024-09-14 DIAGNOSIS — D631 Anemia in chronic kidney disease: Secondary | ICD-10-CM | POA: Diagnosis not present

## 2024-09-14 DIAGNOSIS — N2581 Secondary hyperparathyroidism of renal origin: Secondary | ICD-10-CM | POA: Diagnosis not present

## 2024-09-16 DIAGNOSIS — D631 Anemia in chronic kidney disease: Secondary | ICD-10-CM | POA: Diagnosis not present

## 2024-09-16 DIAGNOSIS — Z992 Dependence on renal dialysis: Secondary | ICD-10-CM | POA: Diagnosis not present

## 2024-09-16 DIAGNOSIS — N186 End stage renal disease: Secondary | ICD-10-CM | POA: Diagnosis not present

## 2024-09-16 DIAGNOSIS — N2581 Secondary hyperparathyroidism of renal origin: Secondary | ICD-10-CM | POA: Diagnosis not present

## 2024-09-18 DIAGNOSIS — D631 Anemia in chronic kidney disease: Secondary | ICD-10-CM | POA: Diagnosis not present

## 2024-09-18 DIAGNOSIS — N2581 Secondary hyperparathyroidism of renal origin: Secondary | ICD-10-CM | POA: Diagnosis not present

## 2024-09-18 DIAGNOSIS — N186 End stage renal disease: Secondary | ICD-10-CM | POA: Diagnosis not present

## 2024-09-18 DIAGNOSIS — Z992 Dependence on renal dialysis: Secondary | ICD-10-CM | POA: Diagnosis not present

## 2024-09-21 DIAGNOSIS — N2581 Secondary hyperparathyroidism of renal origin: Secondary | ICD-10-CM | POA: Diagnosis not present

## 2024-09-21 DIAGNOSIS — D631 Anemia in chronic kidney disease: Secondary | ICD-10-CM | POA: Diagnosis not present

## 2024-09-21 DIAGNOSIS — N186 End stage renal disease: Secondary | ICD-10-CM | POA: Diagnosis not present

## 2024-09-21 DIAGNOSIS — Z992 Dependence on renal dialysis: Secondary | ICD-10-CM | POA: Diagnosis not present

## 2024-09-23 DIAGNOSIS — D631 Anemia in chronic kidney disease: Secondary | ICD-10-CM | POA: Diagnosis not present

## 2024-09-23 DIAGNOSIS — N2581 Secondary hyperparathyroidism of renal origin: Secondary | ICD-10-CM | POA: Diagnosis not present

## 2024-09-23 DIAGNOSIS — Z992 Dependence on renal dialysis: Secondary | ICD-10-CM | POA: Diagnosis not present

## 2024-09-23 DIAGNOSIS — N186 End stage renal disease: Secondary | ICD-10-CM | POA: Diagnosis not present

## 2024-09-24 ENCOUNTER — Other Ambulatory Visit: Payer: Self-pay

## 2024-09-25 DIAGNOSIS — Z992 Dependence on renal dialysis: Secondary | ICD-10-CM | POA: Diagnosis not present

## 2024-09-25 DIAGNOSIS — D631 Anemia in chronic kidney disease: Secondary | ICD-10-CM | POA: Diagnosis not present

## 2024-09-25 DIAGNOSIS — N186 End stage renal disease: Secondary | ICD-10-CM | POA: Diagnosis not present

## 2024-09-25 DIAGNOSIS — N2581 Secondary hyperparathyroidism of renal origin: Secondary | ICD-10-CM | POA: Diagnosis not present

## 2024-09-28 ENCOUNTER — Other Ambulatory Visit (HOSPITAL_COMMUNITY): Payer: Self-pay

## 2024-09-28 ENCOUNTER — Other Ambulatory Visit: Payer: Self-pay

## 2024-09-28 DIAGNOSIS — N2581 Secondary hyperparathyroidism of renal origin: Secondary | ICD-10-CM | POA: Diagnosis not present

## 2024-09-28 DIAGNOSIS — D631 Anemia in chronic kidney disease: Secondary | ICD-10-CM | POA: Diagnosis not present

## 2024-09-28 DIAGNOSIS — Z992 Dependence on renal dialysis: Secondary | ICD-10-CM | POA: Diagnosis not present

## 2024-09-28 DIAGNOSIS — N186 End stage renal disease: Secondary | ICD-10-CM | POA: Diagnosis not present

## 2024-09-30 ENCOUNTER — Other Ambulatory Visit: Payer: Self-pay

## 2024-09-30 DIAGNOSIS — D631 Anemia in chronic kidney disease: Secondary | ICD-10-CM | POA: Diagnosis not present

## 2024-09-30 DIAGNOSIS — Z992 Dependence on renal dialysis: Secondary | ICD-10-CM | POA: Diagnosis not present

## 2024-09-30 DIAGNOSIS — N2581 Secondary hyperparathyroidism of renal origin: Secondary | ICD-10-CM | POA: Diagnosis not present

## 2024-09-30 DIAGNOSIS — N186 End stage renal disease: Secondary | ICD-10-CM | POA: Diagnosis not present

## 2024-09-30 NOTE — Progress Notes (Signed)
 Specialty Pharmacy Refill Coordination Note  Tiffany Golden is a 48 y.o. female contacted today regarding refills of specialty medication(s) Bictegravir-Emtricitab-Tenofov (Biktarvy )   Patient requested Delivery   Delivery date: 10/02/24   Verified address: [address], [place] [address]   Medication will be filled on: 10/01/24

## 2024-09-30 NOTE — Progress Notes (Signed)
 Specialty Pharmacy Ongoing Clinical Assessment Note  Tiffany Golden is a 48 y.o. female who is being followed by the specialty pharmacy service for RxSp HIV   Patient's specialty medication(s) reviewed today: Bictegravir-Emtricitab-Tenofov (Biktarvy )   Missed doses in the last 4 weeks: 0   Patient/Caregiver did not have any additional questions or concerns.   Therapeutic benefit summary: Patient is achieving benefit   Adverse events/side effects summary: No adverse events/side effects   Patient's therapy is appropriate to: Continue    Goals Addressed             This Visit's Progress    Achieve Undetectable HIV Viral Load < 20   On track    Patient is on track. Patient will maintain adherence. Patient's viral load remains undetectable long term.         Follow up: 12 months  [facility]

## 2024-10-02 DIAGNOSIS — D631 Anemia in chronic kidney disease: Secondary | ICD-10-CM | POA: Diagnosis not present

## 2024-10-02 DIAGNOSIS — Z992 Dependence on renal dialysis: Secondary | ICD-10-CM | POA: Diagnosis not present

## 2024-10-02 DIAGNOSIS — N2581 Secondary hyperparathyroidism of renal origin: Secondary | ICD-10-CM | POA: Diagnosis not present

## 2024-10-02 DIAGNOSIS — N186 End stage renal disease: Secondary | ICD-10-CM | POA: Diagnosis not present

## 2024-10-04 DIAGNOSIS — N2581 Secondary hyperparathyroidism of renal origin: Secondary | ICD-10-CM | POA: Diagnosis not present

## 2024-10-04 DIAGNOSIS — D631 Anemia in chronic kidney disease: Secondary | ICD-10-CM | POA: Diagnosis not present

## 2024-10-04 DIAGNOSIS — Z992 Dependence on renal dialysis: Secondary | ICD-10-CM | POA: Diagnosis not present

## 2024-10-04 DIAGNOSIS — N186 End stage renal disease: Secondary | ICD-10-CM | POA: Diagnosis not present

## 2024-10-06 DIAGNOSIS — D631 Anemia in chronic kidney disease: Secondary | ICD-10-CM | POA: Diagnosis not present

## 2024-10-06 DIAGNOSIS — N2581 Secondary hyperparathyroidism of renal origin: Secondary | ICD-10-CM | POA: Diagnosis not present

## 2024-10-06 DIAGNOSIS — N186 End stage renal disease: Secondary | ICD-10-CM | POA: Diagnosis not present

## 2024-10-06 DIAGNOSIS — Z992 Dependence on renal dialysis: Secondary | ICD-10-CM | POA: Diagnosis not present

## 2024-10-09 DIAGNOSIS — N2581 Secondary hyperparathyroidism of renal origin: Secondary | ICD-10-CM | POA: Diagnosis not present

## 2024-10-09 DIAGNOSIS — N186 End stage renal disease: Secondary | ICD-10-CM | POA: Diagnosis not present

## 2024-10-09 DIAGNOSIS — Z992 Dependence on renal dialysis: Secondary | ICD-10-CM | POA: Diagnosis not present

## 2024-10-09 DIAGNOSIS — D631 Anemia in chronic kidney disease: Secondary | ICD-10-CM | POA: Diagnosis not present

## 2024-10-12 ENCOUNTER — Ambulatory Visit: Admitting: Family Medicine

## 2024-10-12 VITALS — BP 139/86 | Ht 61.0 in | Wt 196.1 lb

## 2024-10-12 DIAGNOSIS — N186 End stage renal disease: Secondary | ICD-10-CM | POA: Diagnosis not present

## 2024-10-12 DIAGNOSIS — E785 Hyperlipidemia, unspecified: Secondary | ICD-10-CM | POA: Diagnosis not present

## 2024-10-12 DIAGNOSIS — Z1211 Encounter for screening for malignant neoplasm of colon: Secondary | ICD-10-CM | POA: Diagnosis not present

## 2024-10-12 DIAGNOSIS — Z992 Dependence on renal dialysis: Secondary | ICD-10-CM

## 2024-10-12 DIAGNOSIS — B20 Human immunodeficiency virus [HIV] disease: Secondary | ICD-10-CM | POA: Diagnosis not present

## 2024-10-12 DIAGNOSIS — Z0000 Encounter for general adult medical examination without abnormal findings: Secondary | ICD-10-CM | POA: Diagnosis not present

## 2024-10-12 NOTE — Progress Notes (Addendum)
° °  Subjective:    Patient ID: Tiffany Golden, female    DOB: 1975/12/14, 48 y.o.   MRN: [id number]  HPI Patient is on cholesterol medicine She takes it on a regular basis She denies any health issues with it She does try to monitor her healthy diet She sees her specialist on a regular basis   A review of their health history was completed.  A review of medications was also completed.  Any needed refills; no  Eating habits: Relatively healthy  Falls/  MVA accidents in past few months: No accidents or injuries  Regular exercise: Stays physically active  Specialist pt sees on regular basis: Has infectious disease and nephrology, on dialysis  Preventative health issues were discussed.   Additional concerns: None    Review of Systems     Objective:   Physical Exam General-in no acute distress Eyes-no discharge Lungs-respiratory rate normal, CTA CV-no murmurs,RRR Extremities skin warm dry no edema Neuro grossly normal Behavior normal, alert        Assessment & Plan:  1. Well adult exam Completed today Referral for colonoscopy  2. Hyperlipidemia, unspecified hyperlipidemia type Continue medication check lipid profile await results There will be best for her to continue medication Her specialist will check labs We await the results 3. Screening for colon cancer Referral to [facility] for colonoscopy  4. Obesity, morbid (HCC) Portion control regular physical activity  5. Encounter for subsequent annual wellness visit (AWV) in Medicare patient (Primary) Adult wellness-complete.wellness physical was conducted today. Importance of diet and exercise were discussed in detail.  Importance of stress reduction and healthy living were discussed.  In addition to this a discussion regarding safety was also covered.  We also reviewed over immunizations and gave recommendations regarding current immunization needed for age.   In addition to this additional areas were  also touched on including: Preventative health exams needed:  Colonoscopy continue as stated above  Patient was advised yearly wellness exam   6. Chronic kidney disease requiring chronic dialysis (HCC) Continue dialysis 3 days a week through her specialist  HIV-being cared for by infectious disease specialist

## 2024-10-19 NOTE — Progress Notes (Deleted)
 Subjective:  Chief complaint: follow-up for HIV disease on medications   Patient ID: Tiffany Golden, female    DOB: 1975/12/29, 48 y.o.   MRN: [id number]  HPI  Past Medical History:  Diagnosis Date   Anemia    Chronic kidney disease    Complication of anesthesia    Dialysis patient    mon, wed friday   ESRD (end stage renal disease) (HCC)    HIV infection (HCC)    Hyperlipidemia 02/05/2023   Hypertension    PONV (postoperative nausea and vomiting)    Stroke (HCC)    TIA (transient ischemic attack)    Hx:of    Past Surgical History:  Procedure Laterality Date   ARTERIOVENOUS GRAFT PLACEMENT  09/11/2011   left arm   CESAREAN SECTION     CESAREAN SECTION WITH BILATERAL TUBAL LIGATION  02/29/1996   DILITATION & CURRETTAGE/HYSTROSCOPY WITH NOVASURE ABLATION N/A 05/03/2015   Procedure: DILATATION/HYSTEROSCOPY WITH NOVASURE ABLATION; uterine cavity length 5.0 cm, uterine cavity width 3.8 cm, power 105 watts; time 1 minute 12 seconds;  Surgeon: Norleen Edsel GAILS, MD;  Location: [facility];  Service: Gynecology;  Laterality: N/A;   FISTULA SUPERFICIALIZATION Left 02/23/2021   Procedure: PLICATION OF LEFT ARM FISTULA;  Surgeon: Magda Debby SAILOR, MD;  Location: [facility] OR;  Service: Vascular;  Laterality: Left;   INSERTION OF DIALYSIS CATHETER Right 09/01/2013   Procedure: INSERTION OF DIALYSIS CATHETER Right Internal Jugular;  Surgeon: Carlin FORBES Haddock, MD;  Location: [facility] OR;  Service: Vascular;  Laterality: Right;   PATCH ANGIOPLASTY Left 09/01/2013   Procedure: PATCH ANGIOPLASTY;  Surgeon: Carlin FORBES Haddock, MD;  Location: [facility] OR;  Service: Vascular;  Laterality: Left;   REVISON OF ARTERIOVENOUS FISTULA Left 09/01/2013   Procedure: Plication left arm fistula;  Surgeon: Carlin FORBES Haddock, MD;  Location: [facility] OR;  Service: Vascular;  Laterality: Left;    Family History  Problem Relation Age of Onset   Hyperlipidemia Mother    Cancer - Other Mother        Hx partial hysterectomy   Heart disease  Father        Hx CABG      Social History   Socioeconomic History   Marital status: Single    Spouse name: Not on file   Number of children: Not on file   Years of education: Not on file   Highest education level: Not on file  Occupational History   Not on file  Tobacco Use   Smoking status: Former    Current packs/day: 0.00    Average packs/day: 0.5 packs/day for 3.0 years (1.5 ttl pk-yrs)    Types: Cigarettes    Start date: 09/05/2006    Quit date: 09/05/2009    Years since quitting: 15.1   Smokeless tobacco: Never  Vaping Use   Vaping status: Never Used  Substance and Sexual Activity   Alcohol use: No   Drug use: No   Sexual activity: Not Currently    Partners: Male    Birth control/protection: Abstinence    Comment: declined condoms  Other Topics Concern   Not on file  Social History Narrative   Not on file   Social Drivers of Health   Financial Resource Strain: Low Risk  (03/30/2021)   Overall Financial Resource Strain (CARDIA)    Difficulty of Paying Living Expenses: Not hard at all  Food Insecurity: No Food Insecurity (03/30/2021)   Hunger Vital Sign    Worried About Running Out of Food  in the Last Year: Never true    Ran Out of Food in the Last Year: Never true  Transportation Needs: No Transportation Needs (03/30/2021)   PRAPARE - Administrator, Civil Service (Medical): No    Lack of Transportation (Non-Medical): No  Physical Activity: Insufficiently Active (03/30/2021)   Exercise Vital Sign    Days of Exercise per Week: 4 days    Minutes of Exercise per Session: 30 min  Stress: Stress Concern Present (03/30/2021)   [employer] of Occupational Health - Occupational Stress Questionnaire    Feeling of Stress : To some extent  Social Connections: Moderately Integrated (03/30/2021)   Social Connection and Isolation Panel    Frequency of Communication with Friends and Family: More than three times a week    Frequency of Social Gatherings  with Friends and Family: Once a week    Attends Religious Services: 1 to 4 times per year    Active Member of [employer] or Organizations: Yes    Attends Engineer, Structural: More than 4 times per year    Marital Status: Never married    Allergies  Allergen Reactions   Fortaz [Ceftazidime Sodium In Dextrose] Rash    Head-toe   Sulfa Antibiotics Hives   Vancomycin Rash    Head-toe     Current Outpatient Medications:    B Complex-C-Folic Acid  (RENA-VITE RX) 1 MG TABS, Take 1 tablet by mouth daily., Disp: , Rfl:    bictegravir-emtricitabine -tenofovir  AF (BIKTARVY ) 50-200-25 MG TABS tablet, Take 1 tablet by mouth daily., Disp: 30 tablet, Rfl: 11   calcium  carbonate (TUMS - DOSED IN MG ELEMENTAL CALCIUM ) 500 MG chewable tablet, Chew 1-2 tablets by mouth daily as needed for indigestion or heartburn., Disp: , Rfl:    metoprolol  tartrate (LOPRESSOR ) 25 MG tablet, Take 12.5 mg by mouth 2 (two) times daily as needed (for blood pressure). Except takes none on dialysis days (Tuesday, Thursday, and Sunday)., Disp: , Rfl:    rosuvastatin  (CRESTOR ) 5 MG tablet, Take 1 tablet (5 mg total) by mouth daily., Disp: 30 tablet, Rfl: 11    Review of Systems     Objective:   Physical Exam        Assessment & Plan:

## 2024-10-20 ENCOUNTER — Ambulatory Visit: Payer: Medicare Other | Admitting: Infectious Disease

## 2024-10-22 ENCOUNTER — Other Ambulatory Visit (HOSPITAL_COMMUNITY): Payer: Self-pay

## 2024-10-26 ENCOUNTER — Encounter: Payer: Self-pay | Admitting: Cardiology

## 2024-10-26 NOTE — Addendum Note (Signed)
 Addended by: DELORES LIONEL RAMAN on: 10/26/2024 04:29 PM   Modules accepted: Orders

## 2024-10-27 ENCOUNTER — Other Ambulatory Visit: Payer: Self-pay

## 2024-10-27 NOTE — Progress Notes (Signed)
 Patient Satisfaction Survey Completed.

## 2024-10-27 NOTE — Progress Notes (Signed)
 Specialty Pharmacy Refill Coordination Note  Tiffany Golden is a 48 y.o. female contacted today regarding refills of specialty medication(s) Bictegravir-Emtricitab-Tenofov (Biktarvy )   Patient requested Delivery   Delivery date: 11/02/24   Verified address: [address] [place] [address]   Medication will be filled on: 10/30/24

## 2024-10-29 NOTE — Addendum Note (Signed)
 Addended by: ALPHONSA HAMILTON A on: 10/29/2024 09:18 AM   Modules accepted: Level of Service

## 2024-10-30 ENCOUNTER — Other Ambulatory Visit: Payer: Self-pay

## 2024-10-30 MED ORDER — DILTIAZEM HCL ER COATED BEADS 120 MG PO CP24: 120.0000 mg | ORAL_CAPSULE | Freq: Every day | ORAL | 1 refills | Status: AC

## 2024-11-19 ENCOUNTER — Encounter (HOSPITAL_COMMUNITY): Payer: Self-pay | Admitting: *Deleted

## 2024-11-19 ENCOUNTER — Other Ambulatory Visit (HOSPITAL_COMMUNITY): Payer: Self-pay

## 2024-11-20 ENCOUNTER — Other Ambulatory Visit (HOSPITAL_COMMUNITY): Payer: Self-pay

## 2024-11-23 ENCOUNTER — Ambulatory Visit: Admitting: Infectious Disease

## 2024-11-24 ENCOUNTER — Other Ambulatory Visit: Payer: Self-pay

## 2024-12-02 ENCOUNTER — Other Ambulatory Visit: Payer: Self-pay

## 2024-12-02 ENCOUNTER — Telehealth: Payer: Self-pay

## 2024-12-02 NOTE — Progress Notes (Addendum)
 Specialty Pharmacy Refill Coordination Note  Tiffany Golden is a 49 y.o. female contacted today regarding refills of specialty medication(s) Bictegravir-Emtricitab-Tenofov (Biktarvy )   Patient requested Delivery   Delivery date: 12/08/24   Verified address: [address] [place] [address]   Medication will be filled on: 12/10/24

## 2024-12-02 NOTE — Telephone Encounter (Signed)
 Was able to get patients PAF back open but will need to redo on 12/10/24.

## 2024-12-03 ENCOUNTER — Other Ambulatory Visit: Payer: Self-pay

## 2024-12-03 ENCOUNTER — Encounter (HOSPITAL_COMMUNITY): Payer: Self-pay | Admitting: *Deleted

## 2024-12-03 ENCOUNTER — Other Ambulatory Visit (HOSPITAL_COMMUNITY): Payer: Self-pay

## 2024-12-08 ENCOUNTER — Other Ambulatory Visit: Payer: Self-pay

## 2024-12-15 ENCOUNTER — Encounter (HOSPITAL_COMMUNITY): Payer: Self-pay

## 2024-12-16 ENCOUNTER — Other Ambulatory Visit (HOSPITAL_COMMUNITY): Payer: Self-pay

## 2024-12-16 ENCOUNTER — Telehealth: Payer: Self-pay

## 2024-12-16 NOTE — Telephone Encounter (Signed)
 [facility] Patient Advocate Encounter   I was successful in securing patient a $5000 grant from [employer] (PA[employer]) to provide copayment coverage for BIKTARVY .  This will make the out of pocket cost $0.     I have spoken with the patient.    The billing information is as follows and has been shared with [facility].   RxBin: [id number] PCN:   [id number] Member ID: [id number] Group ID: [id number] Dates of Eligibility: 12/10/24 through 12/16/25  Patient knows to call the office with questions or concerns.    Charmaine Sharps, CPhT Specialty Pharmacy Patient [facility] Phone: [phone] Fax:  [phone]

## 2024-12-17 ENCOUNTER — Encounter (HOSPITAL_COMMUNITY): Admission: RE | Payer: Self-pay | Source: Home / Self Care

## 2024-12-17 ENCOUNTER — Ambulatory Visit (HOSPITAL_COMMUNITY): Admission: RE | Admit: 2024-12-17 | Source: Home / Self Care | Admitting: Surgery

## 2024-12-28 ENCOUNTER — Ambulatory Visit: Admitting: Infectious Disease
